# Patient Record
Sex: Female | Born: 1965 | Race: White | Hispanic: No | Smoking: Current every day smoker
Health system: Southern US, Community
[De-identification: ages and names within clinical notes are randomized; demographics above are authoritative.]

## PROBLEM LIST (undated history)

## (undated) DIAGNOSIS — T7840XA Allergy, unspecified, initial encounter: Secondary | ICD-10-CM

## (undated) DIAGNOSIS — J45909 Unspecified asthma, uncomplicated: Secondary | ICD-10-CM

## (undated) DIAGNOSIS — F1910 Other psychoactive substance abuse, uncomplicated: Secondary | ICD-10-CM

## (undated) DIAGNOSIS — D649 Anemia, unspecified: Secondary | ICD-10-CM

## (undated) DIAGNOSIS — J189 Pneumonia, unspecified organism: Secondary | ICD-10-CM

## (undated) DIAGNOSIS — I10 Essential (primary) hypertension: Secondary | ICD-10-CM

## (undated) DIAGNOSIS — F329 Major depressive disorder, single episode, unspecified: Secondary | ICD-10-CM

## (undated) DIAGNOSIS — F1010 Alcohol abuse, uncomplicated: Secondary | ICD-10-CM

## (undated) DIAGNOSIS — R51 Headache: Secondary | ICD-10-CM

## (undated) DIAGNOSIS — F419 Anxiety disorder, unspecified: Secondary | ICD-10-CM

## (undated) DIAGNOSIS — F32A Depression, unspecified: Secondary | ICD-10-CM

## (undated) HISTORY — DX: Headache: R51

## (undated) HISTORY — DX: Depression, unspecified: F32.A

## (undated) HISTORY — DX: Essential (primary) hypertension: I10

## (undated) HISTORY — PX: OTHER SURGICAL HISTORY: SHX169

## (undated) HISTORY — DX: Allergy, unspecified, initial encounter: T78.40XA

## (undated) HISTORY — DX: Other psychoactive substance abuse, uncomplicated: F19.10

## (undated) HISTORY — DX: Unspecified asthma, uncomplicated: J45.909

## (undated) HISTORY — DX: Anxiety disorder, unspecified: F41.9

## (undated) HISTORY — PX: TONSILECTOMY, ADENOIDECTOMY, BILATERAL MYRINGOTOMY AND TUBES: SHX2538

## (undated) HISTORY — DX: Alcohol abuse, uncomplicated: F10.10

## (undated) HISTORY — DX: Major depressive disorder, single episode, unspecified: F32.9

---

## 2001-01-21 ENCOUNTER — Emergency Department (HOSPITAL_COMMUNITY): Admission: EM | Admit: 2001-01-21 | Discharge: 2001-01-21 | Payer: Self-pay | Admitting: *Deleted

## 2002-09-14 ENCOUNTER — Encounter: Payer: Self-pay | Admitting: Emergency Medicine

## 2002-09-14 ENCOUNTER — Emergency Department (HOSPITAL_COMMUNITY): Admission: EM | Admit: 2002-09-14 | Discharge: 2002-09-15 | Payer: Self-pay | Admitting: Emergency Medicine

## 2003-01-09 ENCOUNTER — Emergency Department (HOSPITAL_COMMUNITY): Admission: EM | Admit: 2003-01-09 | Discharge: 2003-01-09 | Payer: Self-pay | Admitting: Emergency Medicine

## 2004-03-27 ENCOUNTER — Other Ambulatory Visit: Admission: RE | Admit: 2004-03-27 | Discharge: 2004-03-27 | Payer: Self-pay | Admitting: Obstetrics and Gynecology

## 2007-08-02 ENCOUNTER — Ambulatory Visit (HOSPITAL_BASED_OUTPATIENT_CLINIC_OR_DEPARTMENT_OTHER): Admission: RE | Admit: 2007-08-02 | Discharge: 2007-08-02 | Payer: Self-pay | Admitting: Plastic Surgery

## 2007-08-02 ENCOUNTER — Encounter (INDEPENDENT_AMBULATORY_CARE_PROVIDER_SITE_OTHER): Payer: Self-pay | Admitting: Plastic Surgery

## 2010-08-12 ENCOUNTER — Ambulatory Visit: Payer: Self-pay | Admitting: Psychology

## 2010-08-27 ENCOUNTER — Ambulatory Visit: Payer: Self-pay | Admitting: Psychology

## 2010-09-02 ENCOUNTER — Ambulatory Visit: Payer: Self-pay | Admitting: Psychology

## 2010-09-25 ENCOUNTER — Ambulatory Visit: Payer: Self-pay | Admitting: Psychology

## 2010-10-09 ENCOUNTER — Ambulatory Visit: Payer: Self-pay | Admitting: Psychology

## 2010-10-12 ENCOUNTER — Emergency Department (HOSPITAL_COMMUNITY): Admission: EM | Admit: 2010-10-12 | Discharge: 2010-10-13 | Payer: Self-pay | Admitting: Emergency Medicine

## 2010-12-17 ENCOUNTER — Ambulatory Visit
Admission: RE | Admit: 2010-12-17 | Discharge: 2010-12-17 | Payer: Self-pay | Source: Home / Self Care | Attending: Psychology | Admitting: Psychology

## 2010-12-26 ENCOUNTER — Ambulatory Visit
Admission: RE | Admit: 2010-12-26 | Discharge: 2010-12-26 | Payer: Self-pay | Source: Home / Self Care | Attending: Family Medicine | Admitting: Family Medicine

## 2010-12-26 DIAGNOSIS — F419 Anxiety disorder, unspecified: Secondary | ICD-10-CM | POA: Insufficient documentation

## 2010-12-26 DIAGNOSIS — R519 Headache, unspecified: Secondary | ICD-10-CM | POA: Insufficient documentation

## 2010-12-26 DIAGNOSIS — J45909 Unspecified asthma, uncomplicated: Secondary | ICD-10-CM | POA: Insufficient documentation

## 2010-12-26 DIAGNOSIS — F329 Major depressive disorder, single episode, unspecified: Secondary | ICD-10-CM | POA: Insufficient documentation

## 2010-12-26 DIAGNOSIS — F411 Generalized anxiety disorder: Secondary | ICD-10-CM | POA: Insufficient documentation

## 2010-12-26 DIAGNOSIS — R51 Headache: Secondary | ICD-10-CM | POA: Insufficient documentation

## 2010-12-26 DIAGNOSIS — J309 Allergic rhinitis, unspecified: Secondary | ICD-10-CM | POA: Insufficient documentation

## 2010-12-26 DIAGNOSIS — I10 Essential (primary) hypertension: Secondary | ICD-10-CM | POA: Insufficient documentation

## 2010-12-30 ENCOUNTER — Encounter: Payer: Self-pay | Admitting: Family Medicine

## 2011-01-14 ENCOUNTER — Encounter
Admission: RE | Admit: 2011-01-14 | Discharge: 2011-01-14 | Payer: Self-pay | Source: Home / Self Care | Attending: Family Medicine | Admitting: Family Medicine

## 2011-01-19 ENCOUNTER — Encounter: Payer: Self-pay | Admitting: Family Medicine

## 2011-01-22 NOTE — Assessment & Plan Note (Signed)
Summary: BRAND NEW PT/TO EST/PT REQ CPX/PT FASTING/CJR/pt rescd per dr...   Vital Signs:  Patient profile:   45 year old female Weight:      104 pounds O2 Sat:      97 % Temp:     99.5 degrees F Pulse rate:   120 / minute BP sitting:   120 / 70  (left arm) Cuff size:   regular  Vitals Entered By: Pura Spice, RN (December 26, 2010 11:43 AM) CC: to est new. states thinks problem solved her psychiatrist Dr Jennelle Human did bld work and took her off Lithium   History of Present Illness: 45 yr old female to establish with [place] after transfering from Dr. Louanna Raw. She is doing well today and has no complaints. She sees Dr. Jennelle Human for depression and anxiety, and she was started on Lithium about  2 weeks ago. Almost immediately she developed nausea and vomitiing and this was felt to be due to the lithium. She has stopped this and has been started on Abilify instead. So far she tolerates this quite well. Her asthma is well controlled, and she only needs her rescue inhaler once every 1-2 months. She has recently started using an electronic cigarrette device, and she has cut down from 1.5 ppd to 1/3 ppd.   Preventive Screening-Counseling & Management  Alcohol-Tobacco     Smoking Status: current  Allergies (verified): 1)  ! * Lithium  Comments:  Provider: The patient is currently on medications but does not know the name or dosage at this time. Instructed to contact our office with details. Will update medication list at that time.  Past History:  Past Medical History: Allergic rhinitis Anxiety Asthma Depression, possibly bipolar disorder. Sees Dr. [name] for psychiatric care and [name] for therapy Headache (last migraine was in 2004) Hypertension, never on meds  Past Surgical History: benign cyst removed from left cheek  Family History: Reviewed history and no changes required. Family History of CAD Female 1st degree relative <50 Family History Depression Family  History Diabetes 1st degree relative Family History Ovarian cancer  Social History: Reviewed history and no changes required. 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 Married, no children  Current Smoker Alcohol use-no Occupation:  employed Smoking Status:  current  Review of Systems  The patient denies anorexia, fever, weight loss, weight gain, vision loss, decreased hearing, hoarseness, chest pain, syncope, dyspnea on exertion, peripheral edema, prolonged cough, headaches, hemoptysis, abdominal pain, melena, hematochezia, severe indigestion/heartburn, hematuria, incontinence, genital sores, muscle weakness, suspicious skin lesions, transient blindness, difficulty walking, depression, unusual weight change, abnormal bleeding, enlarged lymph nodes, angioedema, breast masses, and testicular masses.         Flu Vaccine Consent Questions     Do you have a history of severe allergic reactions to this vaccine? no    Any prior history of allergic reactions to egg and/or gelatin? no    Do you have a sensitivity to the preservative Thimersol? no    Do you have a past history of Guillan-Barre Syndrome? no    Do you currently have an acute febrile illness? no    Have you ever had a severe reaction to latex? no    Vaccine information given and explained to patient? yes    Are you currently pregnant? no    Lot [id number]   Exp Date:06/20/2011   Site Given right Deltoid IM  sanofi  given by Gloris Ham CMA    Physical Exam  General:  Well-developed,well-nourished,in no acute distress; alert,appropriate and cooperative throughout examination Neck:  No deformities, masses, or tenderness noted. Lungs:  Normal respiratory effort, chest expands symmetrically. Lungs are clear to auscultation, no crackles or wheezes. Heart:  Normal rate and regular rhythm. S1 and S2 normal without gallop, murmur, click, rub or other  extra sounds. Psych:  Cognition and judgment appear intact. Alert and cooperative with normal attention span and concentration. No apparent delusions, illusions, hallucinations   Impression & Recommendations:  Problem # 1:  DEPRESSION (ICD-311)  Her updated medication list for this problem includes:    Zoloft 50 Mg Tabs (Sertraline hcl) .Marland Kitchen... 1 by mouth once daily    Klonopin 0.5 Mg Tabs (Clonazepam) .Marland Kitchen... At bedtime  Problem # 2:  ANXIETY (ICD-300.00)  Her updated medication list for this problem includes:    Zoloft 50 Mg Tabs (Sertraline hcl) .Marland Kitchen... 1 by mouth once daily    Klonopin 0.5 Mg Tabs (Clonazepam) .Marland Kitchen... At bedtime  Problem # 3:  HYPERTENSION (ICD-401.9)  Problem # 4:  HEADACHE (ICD-784.0)  Problem # 5:  ASTHMA (ICD-493.90)  Her updated medication list for this problem includes:    Singulair 10 Mg Tabs (Montelukast sodium) ..... Once daily    Qvar 40 Mcg/act Aers (Beclomethasone dipropionate) .Marland Kitchen... 2 puffs two times a day    Proair Hfa 108 (90 Base) Mcg/act Aers (Albuterol sulfate) .Marland Kitchen... 2 inh q4h as needed shortness of breath  Problem # 6:  ALLERGIC RHINITIS (ICD-477.9)  Her updated medication list for this problem includes:    Omnaris 50 Mcg/act Susp (Ciclesonide) .Marland Kitchen... 2 sprays each nostril two times a day    Claritin 10 Mg Tabs (Loratadine) ..... Once daily  Complete Medication List: 1)  Singulair 10 Mg Tabs (Montelukast sodium) .... Once daily 2)  Qvar 40 Mcg/act Aers (Beclomethasone dipropionate) .... 2 puffs two times a day 3)  Zoloft 50 Mg Tabs (Sertraline hcl) .Marland Kitchen.. 1 by mouth once daily 4)  Klonopin 0.5 Mg Tabs (Clonazepam) .... At bedtime 5)  Abilify 2 Mg Tabs (Aripiprazole) .... At bedtime 6)  Proair Hfa 108 (90 Base) Mcg/act Aers (Albuterol sulfate) .... 2 inh q4h as needed shortness of breath 7)  Omnaris 50 Mcg/act Susp (Ciclesonide) .... 2 sprays each nostril two times a day 8)  Claritin 10 Mg Tabs (Loratadine) .... Once daily  Other Orders: Admin  1st Vaccine (09811) Flu Vaccine 5yrs + (91478)  Patient Instructions: 1)  encouraged her to stop smoking completely. Given a flu shot today. She is set for a cpx with labs next month.    Orders Added: 1)  New Patient Level IV [99204] 2)  Admin 1st Vaccine [90471] 3)  Flu Vaccine 57yrs + [29562]

## 2011-01-22 NOTE — Letter (Signed)
Summary: PATIENT HX FORM  PATIENT HX FORM   Imported By: Georgian Co 12/30/2010 09:01:33  _____________________________________________________________________  External Attachment:    Type:   Image     Comment:   External Document

## 2011-01-23 ENCOUNTER — Emergency Department (HOSPITAL_COMMUNITY): Payer: 59

## 2011-01-23 ENCOUNTER — Emergency Department (HOSPITAL_COMMUNITY)
Admission: EM | Admit: 2011-01-23 | Discharge: 2011-01-24 | Disposition: A | Discharge status: Home / Self Care | Payer: 59 | Attending: Emergency Medicine | Admitting: Emergency Medicine

## 2011-01-23 DIAGNOSIS — W230XXA Caught, crushed, jammed, or pinched between moving objects, initial encounter: Secondary | ICD-10-CM | POA: Insufficient documentation

## 2011-01-23 DIAGNOSIS — M79609 Pain in unspecified limb: Secondary | ICD-10-CM | POA: Insufficient documentation

## 2011-01-23 DIAGNOSIS — IMO0002 Reserved for concepts with insufficient information to code with codable children: Secondary | ICD-10-CM | POA: Insufficient documentation

## 2011-01-23 DIAGNOSIS — S6720XA Crushing injury of unspecified hand, initial encounter: Secondary | ICD-10-CM | POA: Insufficient documentation

## 2011-01-27 ENCOUNTER — Other Ambulatory Visit: Payer: 59 | Admitting: Family Medicine

## 2011-01-27 DIAGNOSIS — Z0000 Encounter for general adult medical examination without abnormal findings: Secondary | ICD-10-CM

## 2011-01-27 LAB — CBC WITH DIFFERENTIAL/PLATELET
Basophils Relative: 0.6 % (ref 0.0–3.0)
Eosinophils Relative: 4.8 % (ref 0.0–5.0)
HCT: 36.3 % (ref 36.0–46.0)
Hemoglobin: 12.4 g/dL (ref 12.0–15.0)
Lymphs Abs: 2.1 10*3/uL (ref 0.7–4.0)
Monocytes Relative: 10.3 % (ref 3.0–12.0)
Neutro Abs: 2.4 10*3/uL (ref 1.4–7.7)
WBC: 5.3 10*3/uL (ref 4.5–10.5)

## 2011-01-27 LAB — LIPID PANEL
HDL: 66 mg/dL (ref >39.00)
LDL Cholesterol: 114 mg/dL — ABNORMAL HIGH (ref 0–99)
Total CHOL/HDL Ratio: 3
Triglycerides: 57 mg/dL (ref 0.0–149.0)

## 2011-01-27 LAB — POCT URINALYSIS DIPSTICK
Bilirubin, UA: NEGATIVE
Glucose, UA: NEGATIVE
Leukocytes, UA: NEGATIVE
Spec Grav, UA: 1.005

## 2011-01-27 LAB — BASIC METABOLIC PANEL
CO2: 26 mEq/L (ref 19–32)
Chloride: 106 mEq/L (ref 96–112)
Creatinine, Ser: 0.7 mg/dL (ref 0.4–1.2)
Glucose, Bld: 75 mg/dL (ref 70–99)

## 2011-01-27 LAB — HEPATIC FUNCTION PANEL: Albumin: 4.1 g/dL (ref 3.5–5.2)

## 2011-01-28 ENCOUNTER — Ambulatory Visit: Payer: Self-pay | Admitting: Psychology

## 2011-01-29 ENCOUNTER — Telehealth: Payer: Self-pay

## 2011-01-29 NOTE — Telephone Encounter (Signed)
Message copied by Madison Hickman on Thu Jan 29, 2011  8:01 AM ------      Message from: Dwaine Deter      Created: Wed Jan 28, 2011  5:06 PM       normal

## 2011-01-29 NOTE — Telephone Encounter (Signed)
Pt called results normal

## 2011-02-11 ENCOUNTER — Ambulatory Visit (INDEPENDENT_AMBULATORY_CARE_PROVIDER_SITE_OTHER): Payer: 59 | Admitting: Family Medicine

## 2011-02-11 ENCOUNTER — Encounter: Payer: Self-pay | Admitting: Family Medicine

## 2011-02-11 ENCOUNTER — Other Ambulatory Visit (HOSPITAL_COMMUNITY)
Admission: RE | Admit: 2011-02-11 | Discharge: 2011-02-11 | Disposition: A | Discharge status: Home / Self Care | Payer: 59 | Source: Ambulatory Visit | Attending: Family Medicine | Admitting: Family Medicine

## 2011-02-11 VITALS — BP 120/74 | HR 99 | Ht 61.0 in | Wt 107.0 lb

## 2011-02-11 DIAGNOSIS — Z01419 Encounter for gynecological examination (general) (routine) without abnormal findings: Secondary | ICD-10-CM | POA: Insufficient documentation

## 2011-02-11 DIAGNOSIS — Z0000 Encounter for general adult medical examination without abnormal findings: Secondary | ICD-10-CM

## 2011-02-11 MED ORDER — MONTELUKAST SODIUM 10 MG PO TABS: 10.0000 mg | ORAL_TABLET | Freq: Every day | ORAL | Status: DC

## 2011-02-11 MED ORDER — IPRATROPIUM-ALBUTEROL 0.5-2.5 (3) MG/3ML IN SOLN: 3.0000 mL | RESPIRATORY_TRACT | Status: DC | PRN

## 2011-02-11 MED ORDER — BECLOMETHASONE DIPROPIONATE 40 MCG/ACT IN AERS: 2.0000 | INHALATION_SPRAY | Freq: Two times a day (BID) | RESPIRATORY_TRACT | Status: DC

## 2011-02-11 MED ORDER — ALBUTEROL SULFATE HFA 108 (90 BASE) MCG/ACT IN AERS: 2.0000 | INHALATION_SPRAY | RESPIRATORY_TRACT | Status: DC | PRN

## 2011-02-11 MED ORDER — CICLESONIDE 50 MCG/ACT NA SUSP: 2.0000 | Freq: Two times a day (BID) | NASAL | Status: DC

## 2011-02-11 NOTE — Progress Notes (Signed)
  Subjective:    Patient ID: Kelly Zuniga, female    DOB: [birth year], 45 y.o.   MRN: [id number]  HPI 45 yr old female for a cpx. She feels well and has no complaints.   Review of Systems  Constitutional: Negative.  Negative for fever, diaphoresis, activity change, appetite change, fatigue and unexpected weight change.  HENT: Negative.  Negative for hearing loss, ear pain, nosebleeds, congestion, sore throat, trouble swallowing, neck pain, neck stiffness, voice change and tinnitus.   Eyes: Negative.  Negative for photophobia, pain, discharge, redness and visual disturbance.  Respiratory: Negative.  Negative for apnea, cough, choking, chest tightness, shortness of breath, wheezing and stridor.   Cardiovascular: Negative.  Negative for chest pain, palpitations and leg swelling.  Gastrointestinal: Negative.  Negative for nausea, vomiting, abdominal pain, diarrhea, constipation, blood in stool, abdominal distention and rectal pain.  Genitourinary: Negative.  Negative for dysuria, urgency, frequency, hematuria, flank pain, scrotal swelling, vaginal bleeding, vaginal discharge, enuresis, difficulty urinating, testicular pain, vaginal pain and menstrual problem.  Musculoskeletal: Negative.  Negative for myalgias, back pain, joint swelling, arthralgias and gait problem.  Skin: Negative.  Negative for color change, pallor, rash and wound.  Neurological: Negative.  Negative for dizziness, tremors, seizures, syncope, speech difficulty, weakness, light-headedness, numbness and headaches.  Hematological: Negative.  Negative for adenopathy. Does not bruise/bleed easily.  Psychiatric/Behavioral: Negative.  Negative for hallucinations, behavioral problems, confusion, sleep disturbance, dysphoric mood and agitation. The patient is not nervous/anxious.        Objective:   Physical Exam  Constitutional: She appears well-developed and well-nourished. No distress.  HENT:  Head: Normocephalic and atraumatic.    Right Ear: External ear normal.  Left Ear: External ear normal.  Nose: Nose normal.  Mouth/Throat: Oropharynx is clear and moist. No oropharyngeal exudate.  Eyes: Conjunctivae and EOM are normal. Pupils are equal, round, and reactive to light. Right eye exhibits no discharge. Left eye exhibits no discharge. No scleral icterus.  Neck: Normal range of motion. Neck supple. No JVD present. No thyromegaly present.  Cardiovascular: Normal rate, regular rhythm, normal heart sounds and intact distal pulses.  Exam reveals no gallop and no friction rub.   No murmur heard. Pulmonary/Chest: Effort normal and breath sounds normal. No stridor. No respiratory distress. She has no wheezes. She has no rales. She exhibits no tenderness.  Abdominal: Soft. Normal appearance and bowel sounds are normal. She exhibits no distension, no abdominal bruit, no ascites and no mass. There is no hepatosplenomegaly. There is no tenderness. There is no rigidity, no rebound and no guarding. No hernia.  Genitourinary: Rectum normal, vagina normal and uterus normal. No breast swelling, tenderness, discharge or bleeding. Cervix exhibits no motion tenderness, no discharge and no friability. Right adnexum displays no mass, no tenderness and no fullness. Left adnexum displays no mass, no tenderness and no fullness. No erythema, tenderness or bleeding around the vagina. No vaginal discharge found.  Musculoskeletal: Normal range of motion. She exhibits no edema and no tenderness.  Lymphadenopathy:    She has no cervical adenopathy.  Neurological: She is alert. She has normal reflexes. No cranial nerve deficit. She exhibits normal muscle tone. Coordination normal.  Skin: Skin is warm and dry. No rash noted. She is not diaphoretic. No erythema. No pallor.  Psychiatric: She has a normal mood and affect. Her behavior is normal. Judgment and thought content normal.          Assessment & Plan:  Well exam.

## 2011-02-19 ENCOUNTER — Telehealth: Payer: Self-pay | Admitting: *Deleted

## 2011-02-19 NOTE — Telephone Encounter (Signed)
Given pt normal pap results.

## 2011-03-04 LAB — URINE MICROSCOPIC-ADD ON

## 2011-03-04 LAB — URINALYSIS, ROUTINE W REFLEX MICROSCOPIC
Glucose, UA: NEGATIVE mg/dL
Protein, ur: 100 mg/dL — AB
pH: 7 (ref 5.0–8.0)

## 2011-03-04 LAB — POCT PREGNANCY, URINE: Preg Test, Ur: NEGATIVE

## 2011-03-13 ENCOUNTER — Other Ambulatory Visit: Payer: Self-pay | Admitting: *Deleted

## 2011-03-13 MED ORDER — CIPROFLOXACIN HCL 500 MG PO TABS: 500.0000 mg | ORAL_TABLET | Freq: Two times a day (BID) | ORAL | Status: AC

## 2011-03-13 NOTE — Telephone Encounter (Signed)
Call in Cipro 500 mg bid for 7 days  

## 2011-03-13 NOTE — Telephone Encounter (Signed)
[facility] Pt has UTI, dysuria, and her usual symptoms......Marland KitchenWants RX and no office visit.

## 2011-03-13 NOTE — Telephone Encounter (Signed)
rx called in and pt notified. 

## 2011-03-20 ENCOUNTER — Telehealth: Payer: Self-pay

## 2011-03-20 NOTE — Telephone Encounter (Signed)
Opened in error

## 2011-04-29 ENCOUNTER — Ambulatory Visit: Payer: 59 | Admitting: Psychology

## 2011-05-05 NOTE — Op Note (Signed)
Kelly Zuniga, Kelly Zuniga           ACCOUNT NO.:  [id number]   MEDICAL RECORD NO.:  [id number]          PATIENT TYPE:  AMB   LOCATION:  [facility]SC                          FACILITY:  [facility]   PHYSICIAN:  Brantley Persons, M.D.DATE OF BIRTH:  [birth year]   DATE OF PROCEDURE:  08/02/2007  DATE OF DISCHARGE:                               OPERATIVE REPORT   PREOPERATIVE DIAGNOSIS:  Right cheek soft tissue mass.   POSTOPERATIVE DIAGNOSIS:  Right cheek soft tissue mass.   PROCEDURE:  1. Excision of 0.7 cm soft tissue mass, right cheek.  2. Complex closure of 1 cm right cheek incision.   ATTENDING SURGEON:  Brantley Persons, M.D.   ANESTHESIA:  1% lidocaine with epinephrine.   COMPLICATIONS:  None.   INDICATIONS FOR PROCEDURE:  The patient is a 45 year old Caucasian  female who has a soft tissue mass of the right upper central cheek area.  She presents to undergo an excision of this soft tissue mass.   DESCRIPTION OF PROCEDURE:  The soft tissue mass was identified in the  right upper cheek and an incision was marked to lie within the natural  skin tension lines over the mass.  The skin and subcutaneous tissues in  the area of the soft tissue mass were then injected with 1% lidocaine  with epinephrine.  After adequate hemostasis and anesthesia had taken  effect, the procedure was begun.   An incision was made over the soft tissue mass and included excision of  an apparent involved clinical skin pore.  Upon entering the subcutaneous  tissues, the soft tissue mass appeared to be a sebaceous cyst.  The cyst  with its lining and internal contents, were then dissected free from  surrounding soft tissues.  The soft tissue mass was thus excised from  the soft tissues and passed off the table to undergo permanent  pathologic section evaluation.  The wound was then examined and the  remnants of the cyst lining were removed thoroughly.  Meticulous  hemostasis was obtained.  The total length of the  soft tissue mass  removed was 0.7 cm.  The wound was irrigated with saline irrigation.  The incision was then closed in a complex fashion.  The deeper  subcutaneous tissues were closed with 4-0 Monocryl suture.  The dermal  layer was also closed with a 4-0 Monocryl interrupted simple sutures.  The skin was then closed with a 6-0 in a running baseball type of  stitch.  There were no complications.  The patient tolerated the  procedure well.  The incision was dressed with bacitracin ointment.  The  patient was given proper postoperative wound care instructions.  The  patient was discharged home in stable condition.  Total length of  incision closure was 1.1 cm.   The patient did well and was discharged home in stable condition.  Follow up appointment will be tomorrow in the office.           ______________________________  Brantley Persons, M.D.     MC/MEDQ  D:  08/02/2007  T:  08/04/2007  Job:  [id number]

## 2011-05-13 ENCOUNTER — Ambulatory Visit (INDEPENDENT_AMBULATORY_CARE_PROVIDER_SITE_OTHER): Payer: 59 | Admitting: Psychology

## 2011-05-13 DIAGNOSIS — F411 Generalized anxiety disorder: Secondary | ICD-10-CM

## 2011-05-21 ENCOUNTER — Ambulatory Visit (INDEPENDENT_AMBULATORY_CARE_PROVIDER_SITE_OTHER): Payer: 59 | Admitting: Psychology

## 2011-05-21 DIAGNOSIS — F411 Generalized anxiety disorder: Secondary | ICD-10-CM

## 2011-06-01 ENCOUNTER — Ambulatory Visit: Payer: 59 | Admitting: Internal Medicine

## 2011-06-04 ENCOUNTER — Ambulatory Visit (INDEPENDENT_AMBULATORY_CARE_PROVIDER_SITE_OTHER): Payer: 59 | Admitting: Psychology

## 2011-06-04 DIAGNOSIS — F411 Generalized anxiety disorder: Secondary | ICD-10-CM

## 2011-08-18 ENCOUNTER — Encounter: Payer: Self-pay | Admitting: Family Medicine

## 2011-08-18 ENCOUNTER — Ambulatory Visit (INDEPENDENT_AMBULATORY_CARE_PROVIDER_SITE_OTHER): Payer: 59 | Admitting: Family Medicine

## 2011-08-18 VITALS — BP 112/70 | HR 104 | Temp 99.1°F | Wt 110.0 lb

## 2011-08-18 DIAGNOSIS — N920 Excessive and frequent menstruation with regular cycle: Secondary | ICD-10-CM

## 2011-08-18 NOTE — Progress Notes (Signed)
  Subjective:    Patient ID: Kelly Zuniga, female    DOB: [birth year], 45 y.o.   MRN: [id number]  HPI Here with concerns over irregular menstrual bleeding. She had a normal pelvic exam here in February, and she has always had very regular cycles until the past 2 months. The time before last she had a cycle one week early, and the next cycle she had 2 weeks late. This started 4 days ago, and she had very heavy bleeding with clotting for 2 days. Her cramps were not particularly bad. Then the bleeding dropped off quite a bit, and today has been quite light. No vaginal DC or odor. No fevers. No change in urinary or bowel habits. No change in meds, and she does not take BCP. She notes that her mother started menopause in her early 83s.    Review of Systems  Constitutional: Negative.   Respiratory: Negative.   Cardiovascular: Negative.   Gastrointestinal: Negative.   Genitourinary: Positive for vaginal bleeding and menstrual problem. Negative for pelvic pain.       Objective:   Physical Exam  Constitutional: She appears well-developed and well-nourished.  Abdominal: Soft. Bowel sounds are normal. She exhibits no distension and no mass. There is no tenderness. There is no rebound and no guarding.          Assessment & Plan:  This is probably some early menopausal changes, but we will draw some labs today. If she resumes more heavy or extended bleeding, we may get a pelvic US or refer her to GYN.

## 2011-08-19 LAB — BASIC METABOLIC PANEL
BUN: 7 mg/dL (ref 6–23)
CO2: 26 mEq/L (ref 19–32)
Glucose, Bld: 75 mg/dL (ref 70–99)
Potassium: 4.4 mEq/L (ref 3.5–5.1)
Sodium: 141 mEq/L (ref 135–145)

## 2011-08-19 LAB — CBC WITH DIFFERENTIAL/PLATELET
Basophils Relative: 0.5 % (ref 0.0–3.0)
Eosinophils Relative: 6.6 % — ABNORMAL HIGH (ref 0.0–5.0)
HCT: 39.4 % (ref 36.0–46.0)
MCV: 104 fl — ABNORMAL HIGH (ref 78.0–100.0)
Monocytes Relative: 10.2 % (ref 3.0–12.0)
Neutrophils Relative %: 49 % (ref 43.0–77.0)
Platelets: 211 10*3/uL (ref 150.0–400.0)
RBC: 3.79 Mil/uL — ABNORMAL LOW (ref 3.87–5.11)
WBC: 4.2 10*3/uL — ABNORMAL LOW (ref 4.5–10.5)

## 2011-08-19 LAB — FOLLICLE STIMULATING HORMONE: FSH: 10.6 m[IU]/mL

## 2011-08-25 ENCOUNTER — Telehealth: Payer: Self-pay | Admitting: *Deleted

## 2011-08-25 DIAGNOSIS — N920 Excessive and frequent menstruation with regular cycle: Secondary | ICD-10-CM

## 2011-08-25 NOTE — Telephone Encounter (Signed)
patient  Is aware of lab results.  However her LMP was 2 week late and she had heavy bleeding.  She would like to know if she should be concerned?

## 2011-08-25 NOTE — Telephone Encounter (Signed)
Message copied by Trenton Gammon on Tue Aug 25, 2011  4:22 PM ------      Message from: Gershon Crane A      Created: Sun Aug 23, 2011  7:58 PM       Normal labs including hormone levels. She is not menopausal yet

## 2011-08-25 NOTE — Telephone Encounter (Signed)
Pt is calling for lab results 

## 2011-08-26 NOTE — Telephone Encounter (Signed)
At this point I would suggest she see a GYN for this. We discussed this at our last visit. Refer her to GYN for menorrhagia (if she does not have a preference, try Dr. Waynard Reeds)

## 2011-08-27 NOTE — Telephone Encounter (Signed)
I spoke with pt and put in the referral.

## 2011-08-28 ENCOUNTER — Telehealth: Payer: Self-pay | Admitting: *Deleted

## 2011-08-28 NOTE — Telephone Encounter (Signed)
What is this about? What eye doctor at [facility] ?

## 2011-08-28 NOTE — Telephone Encounter (Signed)
Pt saw the eye MD at [facility] and he wants Dr. Clent Ridges to order a MRI.  She will drop off the order next week so we can order this.

## 2011-08-31 NOTE — Telephone Encounter (Signed)
Left voice message for pt to return my call.

## 2011-09-01 NOTE — Telephone Encounter (Signed)
She needs Dr. Clent Ridges to put in order due to insurance purposes.

## 2011-09-15 ENCOUNTER — Telehealth: Payer: Self-pay | Admitting: Family Medicine

## 2011-09-15 NOTE — Telephone Encounter (Signed)
This pt left a v.mail on my line. States she is wondering if we have a referral for her for an MRI for [facility]? I looked in the chart and saw where there was communication on this in early September. I do not know anything about this. If we are or are not going to give her a referral, someone needs to please call the pt and let her know what is going on. Thanks!

## 2011-09-17 NOTE — Telephone Encounter (Signed)
I spoke with the patient and explained this situation, including the insurance ins & outs. I also told her she could still have it done locally if [facility] ordered the test.. She understood and said she would call them to obtain the order.

## 2011-09-17 NOTE — Telephone Encounter (Signed)
We did receive a note from Dr. 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 They recommended she have a contrasted brain MRI, and asked [place] to get this locally. I don't even know what an Adie's pupil is. They need to order this not me, because they will have to explain to her insurance company why she needs this done

## 2011-09-25 ENCOUNTER — Telehealth: Payer: Self-pay | Admitting: *Deleted

## 2011-09-25 NOTE — Telephone Encounter (Signed)
Pt is calling to let Dr. Clent Ridges know that she got her results of MRI from [facility], and they will be sending him a letter re: results, and what needs to happen next.  Would like to be called when this is received to set up an appt, or whatever she needs to do next.

## 2011-09-25 NOTE — Telephone Encounter (Signed)
Noted  

## 2011-10-06 ENCOUNTER — Telehealth: Payer: Self-pay | Admitting: Family Medicine

## 2011-10-06 NOTE — Telephone Encounter (Signed)
Pt wants to know what the next step for her plan of care after reviewing her MRI? Also does she need to come in for a office visit to discuss this?

## 2011-10-07 NOTE — Telephone Encounter (Signed)
I still have not received anything from [facility] about this

## 2011-10-08 NOTE — Telephone Encounter (Signed)
Spoke with pt and she will get the results sent to [place].

## 2011-10-09 ENCOUNTER — Telehealth: Payer: Self-pay | Admitting: Family Medicine

## 2011-10-09 NOTE — Telephone Encounter (Signed)
Referral was done  

## 2011-10-09 NOTE — Telephone Encounter (Signed)
Spoke with pt and she would like the referral, no one in particular.

## 2011-10-12 ENCOUNTER — Encounter: Payer: Self-pay | Admitting: Neurology

## 2011-10-26 ENCOUNTER — Telehealth: Payer: Self-pay | Admitting: Family Medicine

## 2011-10-26 NOTE — Telephone Encounter (Signed)
Pt is requesting a copy of her letter that came to [place] from [place].

## 2011-10-27 ENCOUNTER — Encounter: Payer: Self-pay | Admitting: Neurology

## 2011-10-27 ENCOUNTER — Ambulatory Visit (INDEPENDENT_AMBULATORY_CARE_PROVIDER_SITE_OTHER): Payer: 59 | Admitting: Neurology

## 2011-10-27 DIAGNOSIS — G43909 Migraine, unspecified, not intractable, without status migrainosus: Secondary | ICD-10-CM

## 2011-10-27 MED ORDER — SUMATRIPTAN SUCCINATE 50 MG PO TABS: 50.0000 mg | ORAL_TABLET | Freq: Once | ORAL | Status: DC | PRN

## 2011-10-27 MED ORDER — AMITRIPTYLINE HCL 25 MG PO TABS: ORAL_TABLET | ORAL | Status: DC

## 2011-10-27 NOTE — Progress Notes (Signed)
Dear Dr. Clent Ridges,  Thank you for having me see Kelly Zuniga in consultation today at [facility] for her problem with headaches and her diagnosis of Adie's pupil.  As you may recall, she is a 45 y.o. year old female with a history of Adie's pupil, anxiety and depression who has chronic headaches.  On three occassions these have been associated with imbalance resulting in falls.  She describes the headaches as lasting 1-3 days.  They are dull, bifrontal or right sided.  They are accompanied by photo and phonophobia.  She has nausea and sometimes vomiting with the headaches.  She has never had a visual aura.  She has had imbalance as above with the headaches, but no loss of consiousness.  These headaches started when she was late teens.  They occur as often as 25 days per month, and as little as 1-2 times per month.  She has only used Aleve as an abortive.  She has tried avoidance of triggers such as stress, treating her anxiety and depression, taking vitamins with little success.  She has never been on a preventative medication.  She denies significant caffeine use.  She has a history of an Adie's pupil, but then had improvement which triggered investigation for other causes.  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  Past Medical History  Diagnosis Date  . Allergy   . Anxiety   . Asthma   . Depression     posibly bipolar disorder. Sees Dr [name] for psychiatric care and Dr [name] for therapy  . Headache     last migraine 2004  . Hypertension     never on meds   Adie's syndrome  Past Surgical History  Procedure Date  . Benign cyst     removed from left cheek  . Tonsilectomy, adenoidectomy, bilateral myringotomy and tubes     History   Social History  . Marital Status: Married    Spouse Name: N/A    Number of Children: N/A  . Years of Education: N/A   Social History Main Topics  . Smoking status: Current Everyday Smoker    Types: Cigarettes    . Smokeless tobacco: Never Used   Comment: Using the E cigarette to help her quit.  . Alcohol Use: Yes  . Drug Use: No  . Sexually Active: None   Other Topics Concern  . None   Social History Narrative  . None    Family History  Problem Relation Age of Onset  . Coronary artery disease      1st degree relative female <50  . Depression      Family Hx  . Diabetes      Family Hx 1st degree relative  . Ovarian cancer      Family Hx    Mother had "sinus" headaches.   ROS:  13 systems were reviewed and are notable for difficulty sleeping and the other symptoms mentioned above.  She also has a history of difficulty keeping on weight.  All other review of systems are unremarkable.   Examination:  Filed Vitals:   10/27/11 0825  BP: 118/80  Pulse: 92  Height: 5' (1.524 m)  Weight: 109 lb (49.442 kg)     In general, thin appearing petite woman.  Cardiovascular: The patient has a regular rate and rhythm and no carotid bruits.  Fundoscopy:  Disks are flat. Vessel caliber within normal limits.  Mental status:   The patient is oriented to person, place  and time. Recent and remote memory are intact. Attention span and concentration are normal. Language including repetition, naming, following commands are intact. Fund of knowledge of current and historical events, as well as vocabulary are normal.  Cranial Nerves: Pupils are equally round and reactive to light. Visual fields full to confrontation. Extraocular movements are intact without nystagmus. Facial sensation and muscles of mastication are intact. Muscles of facial expression are symmetric. Hearing intact to bilateral finger rub. Tongue protrusion, uvula, palate midline.  Shoulder shrug intact  Motor:  The patient has normal bulk and tone, no pronator drift.  There are no adventitious movements.  5/5 bilaterally.  Reflexes:   Biceps  Triceps Brachioradialis Knee Ankle  Right 2+  2+  2+   2+ 2+  Left   2+  2+  2+   2+ 2+  Toes down  Coordination:  Normal finger to nose.  No dysdiadokinesia.  Sensation is intact to light touch.  Gait and Station are normal.  Tandem gait is intact.  Romberg is negative   Impression/Recommendations: Migraine, sometimes complicated by possible cerebellar symptoms.  I am going to get a copy of her MRI brain but I don't have a good explanation for why her Adie's pupils have improved.  In any case, I think she needs to be on a preventative for her migraines, and I think Elavil to increase to 25mg  qhs makes the most sense given her sleep disturbance.  I have also given her a prescription for Imitrex 50mg .  I have dose it on the low side because of her size.   We will see the patient back in 6 weeks.  Thank you for having [place] see Kelly Zuniga in consultation.  Feel free to contact me with any questions.  Lupita Raider Modesto Charon, MD [facility], [place] [address] [place], [place] [address] Phone: [phone] Fax: [phone].

## 2011-11-04 NOTE — Telephone Encounter (Signed)
Left voice message, pt may want to call [facility] and get a copy of that. I do not see it in our system.

## 2011-11-17 ENCOUNTER — Telehealth: Payer: Self-pay

## 2011-11-17 NOTE — Telephone Encounter (Signed)
Pt to come in and sign records release for [facility]

## 2011-11-17 NOTE — Telephone Encounter (Signed)
Message copied by Lelon Huh on Tue Nov 17, 2011  9:51 AM ------      Message from: Denton Meek H      Created: Mon Nov 16, 2011  9:40 AM       Could we see if we could get a copy of the images of her MRI brain from [facility]?  She just had it done this year.  Thx.

## 2011-11-19 ENCOUNTER — Emergency Department (HOSPITAL_COMMUNITY)
Admission: EM | Admit: 2011-11-19 | Discharge: 2011-11-19 | Disposition: A | Discharge status: Home / Self Care | Payer: 59 | Attending: Emergency Medicine | Admitting: Emergency Medicine

## 2011-11-19 ENCOUNTER — Encounter (HOSPITAL_COMMUNITY): Payer: Self-pay | Admitting: *Deleted

## 2011-11-19 DIAGNOSIS — I10 Essential (primary) hypertension: Secondary | ICD-10-CM | POA: Insufficient documentation

## 2011-11-19 DIAGNOSIS — R112 Nausea with vomiting, unspecified: Secondary | ICD-10-CM | POA: Insufficient documentation

## 2011-11-19 DIAGNOSIS — R739 Hyperglycemia, unspecified: Secondary | ICD-10-CM

## 2011-11-19 DIAGNOSIS — R7309 Other abnormal glucose: Secondary | ICD-10-CM | POA: Insufficient documentation

## 2011-11-19 DIAGNOSIS — E860 Dehydration: Secondary | ICD-10-CM | POA: Insufficient documentation

## 2011-11-19 DIAGNOSIS — R509 Fever, unspecified: Secondary | ICD-10-CM | POA: Insufficient documentation

## 2011-11-19 DIAGNOSIS — J45909 Unspecified asthma, uncomplicated: Secondary | ICD-10-CM | POA: Insufficient documentation

## 2011-11-19 DIAGNOSIS — R109 Unspecified abdominal pain: Secondary | ICD-10-CM | POA: Insufficient documentation

## 2011-11-19 LAB — POCT I-STAT, CHEM 8
BUN: 10 mg/dL (ref 6–23)
Chloride: 102 mEq/L (ref 96–112)
Creatinine, Ser: 0.6 mg/dL (ref 0.50–1.10)
Potassium: 3.8 mEq/L (ref 3.5–5.1)
Sodium: 136 mEq/L (ref 135–145)
TCO2: 19 mmol/L (ref 0–100)

## 2011-11-19 LAB — GLUCOSE, CAPILLARY
Glucose-Capillary: 223 mg/dL — ABNORMAL HIGH (ref 70–99)
Glucose-Capillary: 230 mg/dL — ABNORMAL HIGH (ref 70–99)
Glucose-Capillary: 180 mg/dL — ABNORMAL HIGH (ref 70–99)

## 2011-11-19 MED ORDER — SODIUM CHLORIDE 0.9 % IV BOLUS (SEPSIS)
1000.0000 mL | Freq: Once | INTRAVENOUS | Status: AC
  Administered 2011-11-19: 1000 mL via INTRAVENOUS

## 2011-11-19 MED ORDER — METFORMIN HCL 500 MG PO TABS: 500.0000 mg | ORAL_TABLET | Freq: Every day | ORAL | Status: DC

## 2011-11-19 MED ORDER — ONDANSETRON 8 MG PO TBDP: 8.0000 mg | ORAL_TABLET | Freq: Three times a day (TID) | ORAL | Status: AC | PRN

## 2011-11-19 MED ORDER — SODIUM CHLORIDE 0.9 % IV BOLUS (SEPSIS)
500.0000 mL | Freq: Once | INTRAVENOUS | Status: AC
  Administered 2011-11-19: 500 mL via INTRAVENOUS

## 2011-11-19 NOTE — ED Notes (Signed)
Rx x 2, pt voiced understanding to f/u with PCP tomorrow and take rx as prescribed.

## 2011-11-19 NOTE — ED Provider Notes (Signed)
History     CSN: [id number] Arrival date & time: 11/19/2011  4:57 PM   First MD Initiated Contact with Patient 11/19/11 1950      Chief Complaint  Patient presents with  . Dehydration    (Consider location/radiation/quality/duration/timing/severity/associated sxs/prior treatment) Patient is a 45 y.o. female presenting with vomiting. The history is provided by the patient.  Emesis  This is a new problem. The current episode started 6 to 12 hours ago. The problem occurs continuously. The problem has been rapidly improving. The emesis has an appearance of stomach contents. The maximum temperature recorded prior to her arrival was 100 to 100.9 F. Associated symptoms include abdominal pain. Pertinent negatives include no diarrhea and no headaches.   patient states if she woke up this morning with nausea vomiting. Some crampy upper bowel pain. No diarrhea. Low-grade fevers. She states that she went to an urgent care in background was given the pill in her mouth which helped with the nausea since. She states she feels a lot better now. No current vomiting. She drank a bottle of water. She continues to have a fast heart rate. She states that she was told to come to the ER because her electrolytes may be off.  Past Medical History  Diagnosis Date  . Allergy   . Anxiety   . Asthma   . Depression     posibly bipolar disorder. Sees Dr [name] for psychiatric care and Dr [name] for therapy  . Headache     last migraine 2004  . Hypertension     never on meds     Past Surgical History  Procedure Date  . Benign cyst     removed from left cheek  . Tonsilectomy, adenoidectomy, bilateral myringotomy and tubes     Family History  Problem Relation Age of Onset  . Coronary artery disease      1st degree relative female <50  . Depression      Family Hx  . Diabetes      Family Hx 1st degree relative  . Ovarian cancer      Family Hx     History  Substance Use Topics  . Smoking  status: Current Everyday Smoker    Types: Cigarettes  . Smokeless tobacco: Never Used   Comment: Using the E cigarette to help her quit.  . Alcohol Use: Yes    OB History    Grav Para Term Preterm Abortions TAB SAB Ect Mult Living                  Review of Systems  Constitutional: Negative for activity change and appetite change.  HENT: Negative for neck stiffness.   Eyes: Negative for pain.  Respiratory: Negative for chest tightness and shortness of breath.   Cardiovascular: Negative for chest pain and leg swelling.  Gastrointestinal: Positive for nausea, vomiting and abdominal pain. Negative for diarrhea and constipation.  Genitourinary: Negative for flank pain.  Musculoskeletal: Negative for back pain.  Skin: Negative for rash.  Neurological: Negative for weakness, numbness and headaches.  Psychiatric/Behavioral: Negative for behavioral problems.    Allergies  Acetaminophen and Lithium  Home Medications   Current Outpatient Rx  Name Route Sig Dispense Refill  . ALBUTEROL SULFATE HFA 108 (90 BASE) MCG/ACT IN AERS Inhalation Inhale 2 puffs into the lungs every 4 (four) hours as needed. For shortness of breath    . AMITRIPTYLINE HCL 25 MG PO TABS  12.5-25 mg at bedtime. start with 0.5  tabs before bed for two weeks, then increase to 1 tab before bed at from then on.     . BECLOMETHASONE DIPROPIONATE 40 MCG/ACT IN AERS Inhalation Inhale 2 puffs into the lungs at bedtime.     Marland Kitchen CLONAZEPAM 0.5 MG PO TABS Oral Take 0.5 mg by mouth at bedtime.     Marland Kitchen MONTELUKAST SODIUM 10 MG PO TABS Oral Take 10 mg by mouth at bedtime.      . SERTRALINE HCL 50 MG PO TABS Oral Take 50 mg by mouth at bedtime.     Marland Kitchen METFORMIN HCL 500 MG PO TABS Oral Take 1 tablet (500 mg total) by mouth daily with breakfast. 20 tablet 0  . ONDANSETRON 8 MG PO TBDP Oral Take 1 tablet (8 mg total) by mouth every 8 (eight) hours as needed for nausea. 20 tablet 0  . SUMATRIPTAN SUCCINATE 50 MG PO TABS Oral Take 50 mg by  mouth daily as needed. For migraines       BP 123/71  Pulse 90  Temp(Src) 98.5 F (36.9 C) (Oral)  Resp 21  SpO2 95%  LMP 10/25/2011  Physical Exam  Nursing note and vitals reviewed. Constitutional: She is oriented to person, place, and time. She appears well-developed and well-nourished.  HENT:  Head: Normocephalic and atraumatic.  Eyes: EOM are normal. Pupils are equal, round, and reactive to light.  Neck: Normal range of motion. Neck supple.  Cardiovascular: Normal rate, regular rhythm and normal heart sounds.   No murmur heard.      Tachycardia  Pulmonary/Chest: Effort normal and breath sounds normal. No respiratory distress. She has no wheezes. She has no rales.  Abdominal: Soft. Bowel sounds are normal. She exhibits no distension. There is no tenderness. There is no rebound and no guarding.  Musculoskeletal: Normal range of motion.  Neurological: She is alert and oriented to person, place, and time. No cranial nerve deficit.  Skin: Skin is warm and dry.  Psychiatric: She has a normal mood and affect. Her speech is normal.    ED Course  Procedures (including critical care time)  Labs Reviewed  POCT I-STAT, CHEM 8 - Abnormal; Notable for the following:    Glucose, Bld 260 (*)    Calcium, Ion 1.03 (*)    All other components within normal limits  GLUCOSE, CAPILLARY - Abnormal; Notable for the following:    Glucose-Capillary 230 (*)    All other components within normal limits  GLUCOSE, CAPILLARY - Abnormal; Notable for the following:    Glucose-Capillary 223 (*)    All other components within normal limits  GLUCOSE, CAPILLARY - Abnormal; Notable for the following:    Glucose-Capillary 180 (*)    All other components within normal limits  I-STAT, CHEM 8  POCT CBG MONITORING  POCT CBG MONITORING   No results found.   1. Nausea and vomiting   2. Hyperglycemia       MDM  Patient came in with nausea and vomiting from today. She is seen in urgent care was sent  here because her electrolytes could be off. I-STAT showed a glucose of 260. She is now states she has been urinating a lot at home. No history of diabetes. She's not been any steroids recently. She's tolerated orals here. Her heart rate is improved. She urinated. She'll be started on Zofran for the nausea vomiting and a low dose of metformin. She'll follow with Dr. Clent Ridges her primary care.   Juliet Rude. Rubin Payor, MD 11/19/11 2329

## 2011-11-19 NOTE — ED Notes (Signed)
Pt has had "excessive" vomiting today and was seen at a [facility], given nausea medication and told to come to ER for further evaluation.  Pt is still having some mild nausea.  No further vomiting.

## 2011-11-19 NOTE — ED Notes (Signed)
Phlebotomy called regarding delay with chem 8.

## 2011-12-04 ENCOUNTER — Ambulatory Visit: Payer: 59 | Admitting: Neurology

## 2011-12-08 ENCOUNTER — Encounter: Payer: Self-pay | Admitting: Family Medicine

## 2011-12-08 ENCOUNTER — Ambulatory Visit (INDEPENDENT_AMBULATORY_CARE_PROVIDER_SITE_OTHER): Payer: 59 | Admitting: Family Medicine

## 2011-12-08 VITALS — BP 120/76 | HR 138 | Temp 98.8°F | Wt 112.0 lb

## 2011-12-08 DIAGNOSIS — J329 Chronic sinusitis, unspecified: Secondary | ICD-10-CM

## 2011-12-08 MED ORDER — AZITHROMYCIN 250 MG PO TABS: ORAL_TABLET | ORAL | Status: AC

## 2011-12-08 MED ORDER — HYDROCODONE-HOMATROPINE 5-1.5 MG/5ML PO SYRP: 5.0000 mL | ORAL_SOLUTION | ORAL | Status: AC | PRN

## 2011-12-08 NOTE — Progress Notes (Signed)
  Subjective:    Patient ID: Kelly Zuniga, female    DOB: 24-Oct-1966, 45 y.o.   MRN: [id number]  HPI Here for one week of sinus pressure, PND, ST, and a dry cough. On Mucinex    Review of Systems  HENT: Positive for congestion, postnasal drip and sinus pressure.   Eyes: Negative.   Respiratory: Positive for cough.        Objective:   Physical Exam  Constitutional: She appears well-developed and well-nourished.  HENT:  Right Ear: External ear normal.  Left Ear: External ear normal.  Nose: Nose normal.  Mouth/Throat: Oropharynx is clear and moist. No oropharyngeal exudate.  Eyes: Conjunctivae are normal.  Pulmonary/Chest: Effort normal and breath sounds normal.  Lymphadenopathy:    She has no cervical adenopathy.          Assessment & Plan:  Recheck prn

## 2012-01-26 ENCOUNTER — Telehealth: Payer: Self-pay | Admitting: Neurology

## 2012-01-26 NOTE — Telephone Encounter (Signed)
Pt states she signed a release allowing Dr. Modesto Charon to view her MRI results from [facility]. She is wondering about the "status" of those results.

## 2012-01-28 NOTE — Telephone Encounter (Signed)
Pt notified that we have not received the cd yet from [facility]  I refaxed the release to them and we will look for them to send this time.

## 2012-02-11 ENCOUNTER — Telehealth: Payer: Self-pay | Admitting: Neurology

## 2012-02-11 NOTE — Telephone Encounter (Signed)
Pt would like results from MRI done at [facility].

## 2012-02-12 NOTE — Telephone Encounter (Signed)
Tiffany - could you call Shanera and let her know we have not got the results and that maybe it would be faster if she got them herself.

## 2012-02-16 NOTE — Telephone Encounter (Signed)
Pt aware she will pick up disk.

## 2012-02-26 DIAGNOSIS — H57059 Tonic pupil, unspecified eye: Secondary | ICD-10-CM | POA: Insufficient documentation

## 2012-02-26 HISTORY — DX: Tonic pupil, unspecified eye: H57.059

## 2012-03-21 ENCOUNTER — Telehealth: Payer: Self-pay | Admitting: Neurology

## 2012-03-21 NOTE — Telephone Encounter (Signed)
Pt would like to know if Dr. Modesto Charon has had a chance to look over her MRI results. If so, what is his opinion?

## 2012-03-23 ENCOUNTER — Telehealth: Payer: Self-pay | Admitting: Family Medicine

## 2012-03-23 NOTE — Telephone Encounter (Signed)
Pt called req refills for beclomethasone (QVAR) 40 MCG/ACT inhale,albuterol (PROVENTIL HFA;VENTOLIN HFA) 108 (90 BASE) MCG/ACT inhaler, montelukast (SINGULAIR) 10 MG tablet to [facility].

## 2012-03-24 NOTE — Telephone Encounter (Signed)
Pt notified of mild white matter changes, that Dr. Modesto Charon feels is normal for her, otherwise normal MRI

## 2012-03-25 MED ORDER — ALBUTEROL SULFATE HFA 108 (90 BASE) MCG/ACT IN AERS: 2.0000 | INHALATION_SPRAY | RESPIRATORY_TRACT | Status: DC | PRN

## 2012-03-25 MED ORDER — BECLOMETHASONE DIPROPIONATE 40 MCG/ACT IN AERS: 2.0000 | INHALATION_SPRAY | Freq: Every day | RESPIRATORY_TRACT | Status: DC

## 2012-03-25 MED ORDER — MONTELUKAST SODIUM 10 MG PO TABS: 10.0000 mg | ORAL_TABLET | Freq: Every day | ORAL | Status: DC

## 2012-03-25 NOTE — Telephone Encounter (Signed)
I sent all 3 scripts e-scribe to [facility].

## 2012-08-29 ENCOUNTER — Telehealth: Payer: Self-pay | Admitting: Family Medicine

## 2012-08-29 NOTE — Telephone Encounter (Signed)
Caller: [name]/Patient; Patient Name: Kelly Zuniga; PCP: Gershon Crane [facility]); Best Callback Phone Number: [phone]; Call regarding Vomiting and Diarrhea, onset 9-9.  Patient had x3 episodes of Vomiting and x2 Diarrhea.  Imodium taken prior to call, Diarrhea has improved. LMP August 2013.  Patient had a microwave hamburger at 2am on 9-9, Patient feels meat wasn't cook through.  All emergent symptoms ruled out per Nausea and Vomiting Protocol, home care.  Discussed fluids, bland foods and to call back if symptoms last longer than 3 days.  Patient verbalized understanding.

## 2012-08-29 NOTE — Telephone Encounter (Signed)
Caller: Libni/Patient; Phone: [phone]; Reason for Call: Please email letter to pt at [email or web address] stating that pt can return to work tomorrow am.  Please call pt with any questions, she needs to have this when she returns to work at [employer].

## 2012-08-30 ENCOUNTER — Ambulatory Visit (INDEPENDENT_AMBULATORY_CARE_PROVIDER_SITE_OTHER): Payer: 59 | Admitting: Family Medicine

## 2012-08-30 ENCOUNTER — Encounter: Payer: Self-pay | Admitting: Family Medicine

## 2012-08-30 VITALS — BP 118/64 | HR 72 | Temp 99.2°F | Wt 106.0 lb

## 2012-08-30 DIAGNOSIS — K5289 Other specified noninfective gastroenteritis and colitis: Secondary | ICD-10-CM

## 2012-08-30 DIAGNOSIS — K529 Noninfective gastroenteritis and colitis, unspecified: Secondary | ICD-10-CM

## 2012-08-30 MED ORDER — PROMETHAZINE HCL 25 MG PO TABS: 25.0000 mg | ORAL_TABLET | ORAL | Status: DC | PRN

## 2012-08-30 MED ORDER — CIPROFLOXACIN HCL 500 MG PO TABS: 500.0000 mg | ORAL_TABLET | Freq: Two times a day (BID) | ORAL | Status: AC

## 2012-08-30 NOTE — Telephone Encounter (Signed)
We cannot document anything about this because we did not see her

## 2012-08-30 NOTE — Progress Notes (Signed)
  Subjective:    Patient ID: Kelly Zuniga, female    DOB: January 27, 1966, 46 y.o.   MRN: [id number]  HPI Here for the sudden onset yesterday of nausea, vomiting, and diarrhea. She has a low fever. No abdominal cramps or pain. Able to drink water. Using Imodium AD. She thinks this started after she insufficiently cooked a frozen hamburger in her microwave oven.    Review of Systems  Constitutional: Positive for fever.  Respiratory: Negative.   Cardiovascular: Negative.   Gastrointestinal: Positive for nausea, vomiting and diarrhea. Negative for abdominal pain, constipation, blood in stool and abdominal distention.  Genitourinary: Negative.        Objective:   Physical Exam  Constitutional: She appears well-developed and well-nourished.  Abdominal: Soft. Bowel sounds are normal. She exhibits no distension and no mass. There is no tenderness. There is no rebound and no guarding.          Assessment & Plan:  Drink Gatorade and water. Use Imodium prn. Use Phenergan prn. Cover with Cipro. Written out of work from 08-29-12 until 09-01-12.

## 2012-08-30 NOTE — Telephone Encounter (Signed)
Patient will be in today for an appt

## 2012-08-30 NOTE — Telephone Encounter (Signed)
error 

## 2012-11-22 ENCOUNTER — Encounter: Payer: Self-pay | Admitting: Family Medicine

## 2012-11-22 ENCOUNTER — Ambulatory Visit (INDEPENDENT_AMBULATORY_CARE_PROVIDER_SITE_OTHER): Payer: 59 | Admitting: Family Medicine

## 2012-11-22 VITALS — BP 116/66 | HR 119 | Temp 98.8°F | Wt 108.0 lb

## 2012-11-22 DIAGNOSIS — J329 Chronic sinusitis, unspecified: Secondary | ICD-10-CM

## 2012-11-22 DIAGNOSIS — K589 Irritable bowel syndrome without diarrhea: Secondary | ICD-10-CM

## 2012-11-22 MED ORDER — AZITHROMYCIN 250 MG PO TABS: ORAL_TABLET | ORAL | Status: DC

## 2012-11-22 MED ORDER — TRIAMCINOLONE ACETONIDE(NASAL) 55 MCG/ACT NA INHA: 2.0000 | Freq: Every day | NASAL | Status: DC

## 2012-11-22 MED ORDER — DICYCLOMINE HCL 20 MG PO TABS: 20.0000 mg | ORAL_TABLET | Freq: Three times a day (TID) | ORAL | Status: DC

## 2012-11-22 NOTE — Progress Notes (Signed)
  Subjective:    Patient ID: Kelly Zuniga, female    DOB: 08/19/1966, 46 y.o.   MRN: [id number]  HPI Here for 2 things. First for the past 3 months she has had daily abdominal cramps, bloating, and diarrhea. No fever. Using Imodium. Also she has had 10 day of sinus pressure, HA, and PND. No fever or cough.    Review of Systems  Constitutional: Negative.   HENT: Positive for congestion, postnasal drip and sinus pressure.   Eyes: Negative.   Respiratory: Negative.   Gastrointestinal: Positive for abdominal pain, diarrhea and abdominal distention. Negative for nausea, vomiting, constipation and blood in stool.       Objective:   Physical Exam  Constitutional: She appears well-developed and well-nourished.  HENT:  Right Ear: External ear normal.  Left Ear: External ear normal.  Nose: Nose normal.  Mouth/Throat: Oropharynx is clear and moist.  Eyes: Conjunctivae normal are normal.  Neck: No thyromegaly present.  Pulmonary/Chest: Effort normal and breath sounds normal.  Abdominal: Soft. Bowel sounds are normal. She exhibits no distension and no mass. There is no tenderness. There is no rebound and no guarding.  Lymphadenopathy:    She has no cervical adenopathy.          Assessment & Plan:  Treat the sinusitis with a Zpack. Try Dicyclomine for the IBS. Add a probiotic daily

## 2012-12-26 ENCOUNTER — Ambulatory Visit (INDEPENDENT_AMBULATORY_CARE_PROVIDER_SITE_OTHER): Payer: 59 | Admitting: Psychology

## 2012-12-26 DIAGNOSIS — F411 Generalized anxiety disorder: Secondary | ICD-10-CM

## 2013-02-15 ENCOUNTER — Encounter (HOSPITAL_COMMUNITY): Payer: Self-pay

## 2013-02-15 ENCOUNTER — Emergency Department (HOSPITAL_COMMUNITY)
Admission: EM | Admit: 2013-02-15 | Discharge: 2013-02-15 | Disposition: A | Discharge status: Home / Self Care | Payer: 59 | Source: Home / Self Care | Attending: Emergency Medicine | Admitting: Emergency Medicine

## 2013-02-15 DIAGNOSIS — F411 Generalized anxiety disorder: Secondary | ICD-10-CM

## 2013-02-15 DIAGNOSIS — F419 Anxiety disorder, unspecified: Secondary | ICD-10-CM

## 2013-02-15 LAB — POCT I-STAT, CHEM 8
Chloride: 112 mEq/L (ref 96–112)
HCT: 45 % (ref 36.0–46.0)
Potassium: 3.8 mEq/L (ref 3.5–5.1)

## 2013-02-15 LAB — ETHANOL: Alcohol, Ethyl (B): 116 mg/dL — ABNORMAL HIGH (ref 0–11)

## 2013-02-15 MED ORDER — LORAZEPAM 2 MG/ML IJ SOLN
0.5000 mg | Freq: Once | INTRAMUSCULAR | Status: AC
  Administered 2013-02-15: 0.5 mg via INTRAMUSCULAR

## 2013-02-15 MED ORDER — LORAZEPAM 2 MG/ML IJ SOLN
INTRAMUSCULAR | Status: AC
  Filled 2013-02-15: qty 1

## 2013-02-15 NOTE — ED Provider Notes (Signed)
History     CSN: [id number]  Arrival date & time 02/15/13  1227   First MD Initiated Contact with Patient 02/15/13 1253      Chief Complaint  Patient presents with  . Panic Attack    (Consider location/radiation/quality/duration/timing/severity/associated sxs/prior treatment) HPI Comments: Patient presents to urgent care this morning described that she was watching  TV this morning when she suddenly started experiencing uncontrollable shaking feeling anxious and short of breath, and tearful for no reason. She describes that she has had anxiety attacks in the past but it's been many many months without an inciting attack. Patient denies any abdominal pain, diarrheas or vomiting but does describe that she feels a bit anxious. She describes that about a week ago her psychiatrist decrease her dose of Zoloft to 50 mg daily from 100 mg daily. She has an appointment to see her psychiatrist tomorrow in the afternoon. Patient denies using any illicit drugs but does relate that she drinks about 3-4 glasses of wine every night. Denies having had any alcoholic drinks since last night.  The history is provided by the patient.    Past Medical History  Diagnosis Date  . Allergy   . Anxiety   . Asthma   . Depression     posibly bipolar disorder. Sees Dr [name] for psychiatric care and Dr [name] for therapy  . Headache     last migraine 2004  . Hypertension     never on meds     Past Surgical History  Procedure Laterality Date  . Benign cyst      removed from left cheek  . Tonsilectomy, adenoidectomy, bilateral myringotomy and tubes      Family History  Problem Relation Age of Onset  . Coronary artery disease      1st degree relative female <50  . Depression      Family Hx  . Diabetes      Family Hx 1st degree relative  . Ovarian cancer      Family Hx     History  Substance Use Topics  . Smoking status: Current Every Day Smoker -- 0.30 packs/day    Types: Cigarettes   . Smokeless tobacco: Never Used     Comment: Using the E cigarette to help her quit & still smoking  . Alcohol Use: 3.5 oz/week    7 drink(s) per week    OB History   Grav Para Term Preterm Abortions TAB SAB Ect Mult Living                  Review of Systems  Constitutional: Positive for activity change. Negative for fever, chills, diaphoresis, fatigue and unexpected weight change.  HENT: Negative for congestion.   Respiratory: Positive for shortness of breath. Negative for cough, wheezing and stridor.   Gastrointestinal: Negative for vomiting, abdominal pain, diarrhea, blood in stool and rectal pain.  Endocrine: Negative for cold intolerance, heat intolerance, polydipsia, polyphagia and polyuria.  Musculoskeletal: Negative for gait problem.  Skin: Negative for color change and pallor.  Neurological: Negative for dizziness, weakness, light-headedness, numbness and headaches.    Allergies  Acetaminophen and Lithium  Home Medications   Current Outpatient Rx  Name  Route  Sig  Dispense  Refill  . albuterol (PROVENTIL HFA;VENTOLIN HFA) 108 (90 BASE) MCG/ACT inhaler   Inhalation   Inhale 2 puffs into the lungs every 4 (four) hours as needed. For shortness of breath   3 Inhaler   1   .  beclomethasone (QVAR) 40 MCG/ACT inhaler   Inhalation   Inhale 2 puffs into the lungs at bedtime.   3 Inhaler   1   . dicyclomine (BENTYL) 20 MG tablet   Oral   Take 1 tablet (20 mg total) by mouth 3 (three) times daily.   90 tablet   11   . montelukast (SINGULAIR) 10 MG tablet   Oral   Take 1 tablet (10 mg total) by mouth at bedtime.   90 tablet   1   . sertraline (ZOLOFT) 100 MG tablet   Oral   Take 100 mg by mouth daily.         Marland Kitchen triamcinolone (NASACORT) 55 MCG/ACT nasal inhaler   Nasal   Place 2 sprays into the nose daily.   1 Inhaler   11   . azithromycin (ZITHROMAX) 250 MG tablet      As directed   6 tablet   0   . clonazePAM (KLONOPIN) 0.5 MG tablet   Oral    Take 0.5 mg by mouth 2 (two) times daily as needed for anxiety.         Marland Kitchen EXPIRED: SUMAtriptan (IMITREX) 50 MG tablet   Oral   Take 50 mg by mouth daily as needed. For migraines            BP 129/89  Pulse 108  Temp(Src) 97.3 F (36.3 C) (Oral)  Resp 18  SpO2 100%  Physical Exam  Nursing note and vitals reviewed. Constitutional: She is oriented to person, place, and time. She appears distressed.  HENT:  Head: Normocephalic.  Eyes: No scleral icterus.  Neck: Neck supple. No JVD present.  Cardiovascular: Normal rate, regular rhythm and normal heart sounds.  Exam reveals no gallop and no friction rub.   No murmur heard. Pulmonary/Chest: Effort normal and breath sounds normal.  At times patient seems to start hyperventilating. But she pauses  Neurological: She is alert and oriented to person, place, and time.  Mild global generalized tremor both at rest and with movement  Skin: She is not diaphoretic. No erythema.  Psychiatric: Her speech is normal. Judgment and thought content normal. Her mood appears anxious. She is agitated and hyperactive. She is not slowed, not withdrawn, not actively hallucinating and not combative. Cognition and memory are normal. Cognition and memory are not impaired. She does not express impulsivity or inappropriate judgment. She exhibits normal recent memory. She is attentive.    ED Course  Procedures (including critical care time)  Labs Reviewed  ETHANOL - Abnormal; Notable for the following:    Alcohol, Ethyl (B) 116 (*)    All other components within normal limits  POCT I-STAT, CHEM 8 - Abnormal; Notable for the following:    Calcium, Ion 1.09 (*)    Hemoglobin 15.3 (*)    All other components within normal limits   No results found.   1. Anxiety disorder     The symptoms resolved mostly after 0.5mg   x IM of ativan.  MDM  Anxiety related symptoms with most likely an aside he or panic attack. Questionable alcohol consumption. We are  performing an i-STAT as well as and at the level. Patient will be given 0.5 of Ativan, she has recently titrated down from her Zoloft to 50 mg. She has an appointment to see her psychiatrist tomorrow. No particular symptomatology to suggest an acute cardiovascular or pulmonary event. Patient is currently denying any chest pain.        Jimmie Molly,  MD 02/15/13 2034

## 2013-02-15 NOTE — ED Notes (Addendum)
States she has been having, anxiety , shaking, seating for aprox 1 H PTA. Presently , shaking, hyperventilating, tearful. Skin w/d/color good. Out of her klonopin, has an appt to see her psychiatrist tomorrow at 1:30

## 2013-02-16 ENCOUNTER — Emergency Department (HOSPITAL_COMMUNITY)
Admission: EM | Admit: 2013-02-16 | Discharge: 2013-02-17 | Disposition: A | Discharge status: Other Acute Inpatient Hospital | Payer: 59 | Attending: Emergency Medicine | Admitting: Emergency Medicine

## 2013-02-16 ENCOUNTER — Encounter (HOSPITAL_COMMUNITY): Payer: Self-pay

## 2013-02-16 DIAGNOSIS — F32A Depression, unspecified: Secondary | ICD-10-CM

## 2013-02-16 DIAGNOSIS — F411 Generalized anxiety disorder: Secondary | ICD-10-CM | POA: Insufficient documentation

## 2013-02-16 DIAGNOSIS — F329 Major depressive disorder, single episode, unspecified: Secondary | ICD-10-CM | POA: Insufficient documentation

## 2013-02-16 DIAGNOSIS — R45851 Suicidal ideations: Secondary | ICD-10-CM

## 2013-02-16 DIAGNOSIS — J45909 Unspecified asthma, uncomplicated: Secondary | ICD-10-CM | POA: Insufficient documentation

## 2013-02-16 DIAGNOSIS — Z79899 Other long term (current) drug therapy: Secondary | ICD-10-CM | POA: Insufficient documentation

## 2013-02-16 DIAGNOSIS — F17200 Nicotine dependence, unspecified, uncomplicated: Secondary | ICD-10-CM | POA: Insufficient documentation

## 2013-02-16 DIAGNOSIS — F3289 Other specified depressive episodes: Secondary | ICD-10-CM | POA: Insufficient documentation

## 2013-02-16 DIAGNOSIS — I10 Essential (primary) hypertension: Secondary | ICD-10-CM | POA: Insufficient documentation

## 2013-02-16 DIAGNOSIS — G43909 Migraine, unspecified, not intractable, without status migrainosus: Secondary | ICD-10-CM | POA: Insufficient documentation

## 2013-02-16 DIAGNOSIS — F419 Anxiety disorder, unspecified: Secondary | ICD-10-CM

## 2013-02-16 LAB — RAPID URINE DRUG SCREEN, HOSP PERFORMED
Amphetamines: NOT DETECTED
Barbiturates: NOT DETECTED
Cocaine: NOT DETECTED
Tetrahydrocannabinol: NOT DETECTED

## 2013-02-16 LAB — ETHANOL: Alcohol, Ethyl (B): 259 mg/dL — ABNORMAL HIGH (ref 0–11)

## 2013-02-16 LAB — CBC WITH DIFFERENTIAL/PLATELET
Basophils Absolute: 0.1 10*3/uL (ref 0.0–0.1)
HCT: 38.8 % (ref 36.0–46.0)
Hemoglobin: 13.3 g/dL (ref 12.0–15.0)
Lymphocytes Relative: 22 % (ref 12–46)
Monocytes Absolute: 0.7 10*3/uL (ref 0.1–1.0)
Monocytes Relative: 10 % (ref 3–12)
Neutro Abs: 4.4 10*3/uL (ref 1.7–7.7)
WBC: 7 10*3/uL (ref 4.0–10.5)

## 2013-02-16 LAB — COMPREHENSIVE METABOLIC PANEL
ALT: 150 U/L — ABNORMAL HIGH (ref 0–35)
Albumin: 4.1 g/dL (ref 3.5–5.2)
Alkaline Phosphatase: 172 U/L — ABNORMAL HIGH (ref 39–117)
BUN: 7 mg/dL (ref 6–23)
Chloride: 93 mEq/L — ABNORMAL LOW (ref 96–112)
Glucose, Bld: 62 mg/dL — ABNORMAL LOW (ref 70–99)
Potassium: 3.9 mEq/L (ref 3.5–5.1)
Total Bilirubin: 0.5 mg/dL (ref 0.3–1.2)

## 2013-02-16 LAB — GLUCOSE, CAPILLARY
Glucose-Capillary: 53 mg/dL — ABNORMAL LOW (ref 70–99)
Glucose-Capillary: 84 mg/dL (ref 70–99)

## 2013-02-16 MED ORDER — CLONAZEPAM 0.5 MG PO TABS
0.5000 mg | ORAL_TABLET | Freq: Two times a day (BID) | ORAL | Status: DC
  Administered 2013-02-16 – 2013-02-17 (×2): 0.5 mg via ORAL
  Filled 2013-02-16 (×2): qty 1

## 2013-02-16 MED ORDER — SUMATRIPTAN SUCCINATE 50 MG PO TABS
50.0000 mg | ORAL_TABLET | ORAL | Status: DC | PRN
  Filled 2013-02-16: qty 1

## 2013-02-16 MED ORDER — ALUM & MAG HYDROXIDE-SIMETH 200-200-20 MG/5ML PO SUSP
30.0000 mL | ORAL | Status: DC | PRN
Start: 2013-02-16 — End: 2013-02-17

## 2013-02-16 MED ORDER — IBUPROFEN 600 MG PO TABS: 600.0000 mg | ORAL_TABLET | Freq: Three times a day (TID) | ORAL | Status: DC | PRN

## 2013-02-16 MED ORDER — NICOTINE 21 MG/24HR TD PT24
21.0000 mg | MEDICATED_PATCH | Freq: Every day | TRANSDERMAL | Status: DC
  Administered 2013-02-16 – 2013-02-17 (×2): 21 mg via TRANSDERMAL
  Filled 2013-02-16 (×2): qty 1

## 2013-02-16 MED ORDER — MONTELUKAST SODIUM 10 MG PO TABS
10.0000 mg | ORAL_TABLET | Freq: Every day | ORAL | Status: DC
  Administered 2013-02-16 – 2013-02-17 (×2): 10 mg via ORAL
  Filled 2013-02-16 (×2): qty 1

## 2013-02-16 MED ORDER — FLUTICASONE PROPIONATE HFA 44 MCG/ACT IN AERO
1.0000 | INHALATION_SPRAY | Freq: Two times a day (BID) | RESPIRATORY_TRACT | Status: DC
  Administered 2013-02-16 – 2013-02-17 (×3): 1 via RESPIRATORY_TRACT
  Filled 2013-02-16: qty 10.6

## 2013-02-16 MED ORDER — DICYCLOMINE HCL 20 MG PO TABS
20.0000 mg | ORAL_TABLET | Freq: Three times a day (TID) | ORAL | Status: DC
  Administered 2013-02-16 – 2013-02-17 (×4): 20 mg via ORAL
  Filled 2013-02-16 (×4): qty 1

## 2013-02-16 MED ORDER — ALBUTEROL SULFATE HFA 108 (90 BASE) MCG/ACT IN AERS: 2.0000 | INHALATION_SPRAY | RESPIRATORY_TRACT | Status: DC | PRN

## 2013-02-16 MED ORDER — LORAZEPAM 1 MG PO TABS
1.0000 mg | ORAL_TABLET | Freq: Three times a day (TID) | ORAL | Status: DC | PRN
  Administered 2013-02-16: 1 mg via ORAL
  Filled 2013-02-16: qty 1

## 2013-02-16 MED ORDER — VENLAFAXINE HCL ER 75 MG PO CP24
75.0000 mg | ORAL_CAPSULE | Freq: Every day | ORAL | Status: DC
  Administered 2013-02-17: 75 mg via ORAL
  Filled 2013-02-16 (×2): qty 1

## 2013-02-16 MED ORDER — ZOLPIDEM TARTRATE 5 MG PO TABS
5.0000 mg | ORAL_TABLET | Freq: Every evening | ORAL | Status: DC | PRN
  Administered 2013-02-16: 5 mg via ORAL
  Filled 2013-02-16: qty 1

## 2013-02-16 MED ORDER — ONDANSETRON HCL 4 MG PO TABS: 4.0000 mg | ORAL_TABLET | Freq: Three times a day (TID) | ORAL | Status: DC | PRN

## 2013-02-16 MED ORDER — FLUTICASONE PROPIONATE 50 MCG/ACT NA SUSP
1.0000 | Freq: Every day | NASAL | Status: DC
  Administered 2013-02-16 – 2013-02-17 (×2): 1 via NASAL
  Filled 2013-02-16: qty 16

## 2013-02-16 NOTE — ED Notes (Signed)
Belongings bag x 1 locked in locker #40.

## 2013-02-16 NOTE — ED Notes (Signed)
telepsych in progress 

## 2013-02-16 NOTE — ED Notes (Signed)
Pt  Ate most of her supper

## 2013-02-16 NOTE — ED Notes (Signed)
Patient reports that she is very anxious and depressed. Patient reports that she was having shakes and anxiety yesterday. Patient states that she was seen at [facility] and stated that her "heart was OK." Today patient went to [facility] today and was told to come to the ED for suicidal thoughts. Paatient states she has a plan to shoot herself. Patient is very tearful and anxious.

## 2013-02-16 NOTE — ED Notes (Signed)
Dr Juleen China aware of [facility]

## 2013-02-16 NOTE — ED Notes (Signed)
Pt reports she has not eaten since breakfast yesterday

## 2013-02-16 NOTE — ED Provider Notes (Addendum)
Consultation was reviewed. Recommending inpatient psychiatric care. Will IVC patient. Medications ordered per recommendations aside from effexor instead of pristiq as it is non-formulary. Raeford Razor, MD 02/16/13 1950  Raeford Razor, MD 02/16/13 [phone]

## 2013-02-16 NOTE — ED Notes (Signed)
ACT consult being done now.

## 2013-02-16 NOTE — ED Notes (Addendum)
Snack given-Lab blood sugar verified w/ [facility]

## 2013-02-16 NOTE — ED Notes (Signed)
Up to the bathroom 
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
## 2013-02-16 NOTE — ED Provider Notes (Signed)
History     CSN: [id number]  Arrival date & time 02/16/13  1450   First MD Initiated Contact with Patient 02/16/13 1529      Chief Complaint  Patient presents with  . Medical Clearance    (Consider location/radiation/quality/duration/timing/severity/associated sxs/prior treatment) HPI Comments: 47 year old female presents to the emergency department with her father for medical clearance. Patient had an anxiety attack yesterday and went to urgent care. She was given a shot of Ativan, went home and this morning went to see her counselor. Her counselor advised her to come to the emergency department due to patient expressing suicidal ideations. Patient states she has a gun at home and was planning to shoot herself. Currently the gun is at her home with her husband. Husband is unaware that patient is in the emergency department right now. She was hospitalized at the age of 83 for suicidal ideations. Admits to alcohol use nightly with "a few glasses of wine". Denies any other drug use. Denies homicidal ideations.  The history is provided by the patient.    Past Medical History  Diagnosis Date  . Allergy   . Anxiety   . Asthma   . Depression     posibly bipolar disorder. Sees Dr [name] for psychiatric care and Dr [name] for therapy  . Headache     last migraine 2004  . Hypertension     never on meds     Past Surgical History  Procedure Laterality Date  . Benign cyst      removed from left cheek  . Tonsilectomy, adenoidectomy, bilateral myringotomy and tubes      Family History  Problem Relation Age of Onset  . Coronary artery disease      1st degree relative female <50  . Depression      Family Hx  . Diabetes      Family Hx 1st degree relative  . Ovarian cancer      Family Hx     History  Substance Use Topics  . Smoking status: Current Every Day Smoker -- 0.50 packs/day    Types: Cigarettes  . Smokeless tobacco: Never Used     Comment: Using the E  cigarette to help her quit & still smoking  . Alcohol Use: 0.0 oz/week     Comment: daily use-4 glasses wine     OB History   Grav Para Term Preterm Abortions TAB SAB Ect Mult Living                  Review of Systems  Psychiatric/Behavioral: Positive for suicidal ideas, sleep disturbance and dysphoric mood. Negative for self-injury. The patient is nervous/anxious.   All other systems reviewed and are negative.    Allergies  Acetaminophen and Lithium  Home Medications   Current Outpatient Rx  Name  Route  Sig  Dispense  Refill  . albuterol (PROVENTIL HFA;VENTOLIN HFA) 108 (90 BASE) MCG/ACT inhaler   Inhalation   Inhale 2 puffs into the lungs every 4 (four) hours as needed. For shortness of breath   3 Inhaler   1   . beclomethasone (QVAR) 40 MCG/ACT inhaler   Inhalation   Inhale 2 puffs into the lungs at bedtime.   3 Inhaler   1   . clonazePAM (KLONOPIN) 0.5 MG tablet   Oral   Take 0.5 mg by mouth 2 (two) times daily as needed for anxiety.         . dicyclomine (BENTYL) 20 MG tablet  Oral   Take 1 tablet (20 mg total) by mouth 3 (three) times daily.   90 tablet   11   . montelukast (SINGULAIR) 10 MG tablet   Oral   Take 1 tablet (10 mg total) by mouth at bedtime.   90 tablet   1   . Multiple Vitamin (MULTIVITAMIN WITH MINERALS) TABS   Oral   Take 1 tablet by mouth daily.         . sertraline (ZOLOFT) 50 MG tablet   Oral   Take 50 mg by mouth daily.         Marland Kitchen triamcinolone (NASACORT) 55 MCG/ACT nasal inhaler   Nasal   Place 2 sprays into the nose daily.   1 Inhaler   11   . EXPIRED: SUMAtriptan (IMITREX) 50 MG tablet   Oral   Take 50 mg by mouth daily as needed. For migraines            BP 136/81  Pulse 116  Temp(Src) 98.7 F (37.1 C) (Oral)  Resp 20  SpO2 97%  LMP 02/16/2013  Physical Exam  Nursing note and vitals reviewed. Constitutional: She is oriented to person, place, and time. She appears well-developed and  well-nourished. No distress.  Tearful  HENT:  Head: Normocephalic and atraumatic.  Eyes: Conjunctivae and EOM are normal. Pupils are equal, round, and reactive to light.  Neck: Normal range of motion. Neck supple.  Cardiovascular: Normal rate, regular rhythm and normal heart sounds.   Pulmonary/Chest: Effort normal and breath sounds normal.  Abdominal: Soft. Bowel sounds are normal. There is no tenderness.  Musculoskeletal: Normal range of motion. She exhibits no edema.  Neurological: She is alert and oriented to person, place, and time.  Skin: Skin is warm and dry.  Psychiatric: Her speech is normal. Her mood appears anxious. She exhibits a depressed mood. She expresses suicidal ideation. She expresses no homicidal ideation. She expresses suicidal plans. She expresses no homicidal plans.    ED Course  Procedures (including critical care time)  Labs Reviewed  CBC WITH DIFFERENTIAL - Abnormal; Notable for the following:    RBC 3.51 (*)    MCV 110.5 (*)    MCH 37.9 (*)    All other components within normal limits  COMPREHENSIVE METABOLIC PANEL  ETHANOL  ACETAMINOPHEN LEVEL  SALICYLATE LEVEL  URINE RAPID DRUG SCREEN (HOSP PERFORMED)   No results found.   1. Anxiety   2. Depression   3. Suicidal ideation       MDM  47 y/o female with SI. Telepsych ordered. Awaiting ACT team consult. 4:05 PM ACT team aware of patient and will evaluate.       Trevor Mace, PA-C 02/17/13 [phone]

## 2013-02-17 ENCOUNTER — Inpatient Hospital Stay (HOSPITAL_COMMUNITY)
Admission: AD | Admit: 2013-02-17 | Discharge: 2013-02-23 | DRG: 897 | Disposition: A | Discharge status: Home / Self Care | Payer: 59 | Source: Intra-hospital | Attending: Psychiatry | Admitting: Psychiatry

## 2013-02-17 DIAGNOSIS — F10239 Alcohol dependence with withdrawal, unspecified: Secondary | ICD-10-CM

## 2013-02-17 DIAGNOSIS — F411 Generalized anxiety disorder: Secondary | ICD-10-CM | POA: Diagnosis present

## 2013-02-17 DIAGNOSIS — J45909 Unspecified asthma, uncomplicated: Secondary | ICD-10-CM | POA: Diagnosis present

## 2013-02-17 DIAGNOSIS — F419 Anxiety disorder, unspecified: Secondary | ICD-10-CM | POA: Diagnosis present

## 2013-02-17 DIAGNOSIS — Z79899 Other long term (current) drug therapy: Secondary | ICD-10-CM

## 2013-02-17 DIAGNOSIS — F1010 Alcohol abuse, uncomplicated: Secondary | ICD-10-CM

## 2013-02-17 DIAGNOSIS — F1910 Other psychoactive substance abuse, uncomplicated: Secondary | ICD-10-CM | POA: Diagnosis present

## 2013-02-17 DIAGNOSIS — I10 Essential (primary) hypertension: Secondary | ICD-10-CM | POA: Diagnosis present

## 2013-02-17 DIAGNOSIS — F329 Major depressive disorder, single episode, unspecified: Secondary | ICD-10-CM

## 2013-02-17 DIAGNOSIS — F1020 Alcohol dependence, uncomplicated: Principal | ICD-10-CM | POA: Diagnosis present

## 2013-02-17 MED ORDER — ADULT MULTIVITAMIN W/MINERALS CH
1.0000 | ORAL_TABLET | Freq: Every day | ORAL | Status: DC
  Administered 2013-02-17: 1 via ORAL
  Filled 2013-02-17: qty 1

## 2013-02-17 MED ORDER — CHLORDIAZEPOXIDE HCL 25 MG PO CAPS
25.0000 mg | ORAL_CAPSULE | Freq: Once | ORAL | Status: AC
  Administered 2013-02-17: 25 mg via ORAL

## 2013-02-17 MED ORDER — TRAZODONE HCL 50 MG PO TABS
50.0000 mg | ORAL_TABLET | Freq: Every day | ORAL | Status: DC
  Administered 2013-02-17: 50 mg via ORAL
  Filled 2013-02-17: qty 1

## 2013-02-17 MED ORDER — LOPERAMIDE HCL 2 MG PO CAPS
2.0000 mg | ORAL_CAPSULE | ORAL | Status: DC | PRN
  Administered 2013-02-17: 4 mg via ORAL
  Filled 2013-02-17: qty 2

## 2013-02-17 MED ORDER — CHLORDIAZEPOXIDE HCL 25 MG PO CAPS
25.0000 mg | ORAL_CAPSULE | Freq: Four times a day (QID) | ORAL | Status: DC | PRN
  Filled 2013-02-17: qty 1

## 2013-02-17 MED ORDER — THIAMINE HCL 100 MG/ML IJ SOLN
100.0000 mg | Freq: Once | INTRAMUSCULAR | Status: AC
  Administered 2013-02-17: 100 mg via INTRAMUSCULAR
  Filled 2013-02-17: qty 2

## 2013-02-17 MED ORDER — HYDROXYZINE HCL 25 MG PO TABS: 25.0000 mg | ORAL_TABLET | Freq: Four times a day (QID) | ORAL | Status: DC | PRN

## 2013-02-17 MED ORDER — ONDANSETRON 4 MG PO TBDP: 4.0000 mg | ORAL_TABLET | Freq: Four times a day (QID) | ORAL | Status: DC | PRN

## 2013-02-17 MED ORDER — VITAMIN B-1 100 MG PO TABS: 100.0000 mg | ORAL_TABLET | Freq: Every day | ORAL | Status: DC

## 2013-02-17 MED ORDER — LORAZEPAM 1 MG PO TABS
1.0000 mg | ORAL_TABLET | Freq: Once | ORAL | Status: AC
  Administered 2013-02-17: 1 mg via ORAL
  Filled 2013-02-17: qty 1

## 2013-02-17 NOTE — ED Notes (Signed)
Pt very tearful and anxious about going to [facility], she was there when [facility] was [facility] when she was 47 y.o. D/t +si and she said it was a horrible experience. Apparently, she kept in touch with her roommate from there and she killed herself after being discharged [facility]. Pt is fearful of going to [facility]. Writer tried to reassure pt that the facility has changed and there's lots of good people that work there that really care about the pt's and my hope for her is that she sees that right away. Explained to pt to work with her people on her team, her psychiatrist, her counselor and attend the groups with participation as well. Called the EDP to get pt something for anxiety. Medicated pt for diarrhea. Will be sending nasal spray and inhaler with pt to [facility] with her.

## 2013-02-17 NOTE — ED Notes (Signed)
Up to bathroom

## 2013-02-17 NOTE — [facility] Counselor (Signed)
Patient is to be evaluated for placement by Dr. Elsie Saas in the morning, due to conflicting information in the patient's assessment and in the telepsych evaluation.

## 2013-02-17 NOTE — Consult Note (Signed)
Reason for Consult:panic attack, depression and suicidal ideation with plan of shooting herself Referring Physician: Dr. Talmage Nap Kelly Zuniga is an 47 y.o. female.  HPI: Patient was seen and chart reviewed. Patient came to the [facility] long emergency department with the panic symptoms following alcohol intoxication. Patient has been suffering with depression and anxiety disorder since age 39 years old. Patient has been treated by local psychiatrist office who has changed her antidepressant medication 100 mg to 50 mg because of questionable tolerability. Patient primary provider send her to the [facility] urgent care with panic episode and than received ativan shot and referred back two days ago. She was referred to the [facility] long emergency department for second panic attack. Patient reported she is trying to control her depression and anxiety when drinking the alcohol because she does not like taking her pills. Reportedly she has been taking psychiatric medication over several years. Patient endorses suicidal thoughts and plan of killing herself with the gun. Patient was trained to use gun and owns a gun. 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 Patient has a previous acute psychiatric hospitalization at the [facility] in [place] about 20 years ago for depression and suicidal attempt. Patient does not want to be admitted to the behavioral health because of her past memories.  Mental status examination: Patient was anxious and cooperative she has shaky hands and decreased psychomotor activity. Patient stated mood was depressed and anxious her affect was appropriate she has normal rate and rhythm but low-volume of speech. Her thought processes is linear and goal-directed. She has suicidal thoughts intentions or plans. Patient has denied onset ideation intents  or plan. Patient has no evidence of psychotic symptoms.   Past Medical History  Diagnosis Date  . Allergy   . Anxiety   . Asthma   . Depression     posibly bipolar disorder. Sees Dr [name] for psychiatric care and Dr [name] for therapy  . Headache     last migraine 2004  . Hypertension     never on meds     Past Surgical History  Procedure Laterality Date  . Benign cyst      removed from left cheek  . Tonsilectomy, adenoidectomy, bilateral myringotomy and tubes      Family History  Problem Relation Age of Onset  . Coronary artery disease      1st degree relative female <50  . Depression      Family Hx  . Diabetes      Family Hx 1st degree relative  . Ovarian cancer      Family Hx     Social History:  reports that she has been smoking Cigarettes.  She has been smoking about 0.50 packs per day. She has never used smokeless tobacco. She reports that  drinks alcohol. She reports that she does not use illicit drugs.  Allergies:  Allergies  Allergen Reactions  . Acetaminophen     Makes LFT gets elevated  . Lithium     REACTION: no appetite    Medications:I have reviewed the patient's current medications.}  Results for orders placed during the hospital encounter of 02/16/13 (from the past 48 hour(s))  CBC WITH DIFFERENTIAL     Status: Abnormal   Collection Time    02/16/13  3:07 PM  Result Value Range   WBC 7.0  4.0 - 10.5 K/uL   RBC 3.51 (*) 3.87 - 5.11 MIL/uL   Hemoglobin 13.3  12.0 - 15.0 g/dL   HCT 45.4  09.8 - 11.9 %   MCV 110.5 (*) 78.0 - 100.0 fL   MCH 37.9 (*) 26.0 - 34.0 pg   MCHC 34.3  30.0 - 36.0 g/dL   RDW 14.7  82.9 - 56.2 %   Platelets 234  150 - 400 K/uL   Neutrophils Relative 63  43 - 77 %   Neutro Abs 4.4  1.7 - 7.7 K/uL   Lymphocytes Relative 22  12 - 46 %   Lymphs Abs 1.5  0.7 - 4.0 K/uL   Monocytes Relative 10  3 - 12 %   Monocytes Absolute 0.7  0.1 - 1.0 K/uL   Eosinophils Relative 4  0 - 5 %   Eosinophils Absolute 0.3   0.0 - 0.7 K/uL   Basophils Relative 1  0 - 1 %   Basophils Absolute 0.1  0.0 - 0.1 K/uL   Smear Review MORPHOLOGY UNREMARKABLE    COMPREHENSIVE METABOLIC PANEL     Status: Abnormal   Collection Time    02/16/13  3:07 PM      Result Value Range   Sodium 131 (*) 135 - 145 mEq/L   Potassium 3.9  3.5 - 5.1 mEq/L   Chloride 93 (*) 96 - 112 mEq/L   CO2 17 (*) 19 - 32 mEq/L   Glucose, Bld 62 (*) 70 - 99 mg/dL   BUN 7  6 - 23 mg/dL   Creatinine, Ser 1.30  0.50 - 1.10 mg/dL   Calcium 8.8  8.4 - 86.5 mg/dL   Total Protein 7.2  6.0 - 8.3 g/dL   Albumin 4.1  3.5 - 5.2 g/dL   AST 784 (*) 0 - 37 U/L   ALT 150 (*) 0 - 35 U/L   Alkaline Phosphatase 172 (*) 39 - 117 U/L   Total Bilirubin 0.5  0.3 - 1.2 mg/dL   GFR calc non Af Amer >90  >90 mL/min   GFR calc Af Amer >90  >90 mL/min   Comment:            The eGFR has been calculated     using the CKD EPI equation.     This calculation has not been     validated in all clinical     situations.     eGFR's persistently     <90 mL/min signify     possible Chronic Kidney Disease.  ETHANOL     Status: Abnormal   Collection Time    02/16/13  3:07 PM      Result Value Range   Alcohol, Ethyl (B) 259 (*) 0 - 11 mg/dL   Comment:            LOWEST DETECTABLE LIMIT FOR     SERUM ALCOHOL IS 11 mg/dL     FOR MEDICAL PURPOSES ONLY  ACETAMINOPHEN LEVEL     Status: None   Collection Time    02/16/13  3:07 PM      Result Value Range   Acetaminophen (Tylenol), Serum <15.0  10 - 30 ug/mL   Comment:            THERAPEUTIC CONCENTRATIONS VARY     SIGNIFICANTLY. A RANGE OF 10-30     ug/mL MAY BE AN EFFECTIVE     CONCENTRATION FOR MANY PATIENTS.  HOWEVER, SOME ARE BEST TREATED     AT CONCENTRATIONS OUTSIDE THIS     RANGE.     ACETAMINOPHEN CONCENTRATIONS     >150 ug/mL AT 4 HOURS AFTER     INGESTION AND >50 ug/mL AT 12     HOURS AFTER INGESTION ARE     OFTEN ASSOCIATED WITH TOXIC     REACTIONS.  SALICYLATE LEVEL     Status: Abnormal   Collection  Time    02/16/13  3:07 PM      Result Value Range   Salicylate Lvl <2.0 (*) 2.8 - 20.0 mg/dL  URINE RAPID DRUG SCREEN (HOSP PERFORMED)     Status: None   Collection Time    02/16/13  3:17 PM      Result Value Range   Opiates NONE DETECTED  NONE DETECTED   Cocaine NONE DETECTED  NONE DETECTED   Benzodiazepines NONE DETECTED  NONE DETECTED   Amphetamines NONE DETECTED  NONE DETECTED   Tetrahydrocannabinol NONE DETECTED  NONE DETECTED   Barbiturates NONE DETECTED  NONE DETECTED   Comment:            DRUG SCREEN FOR MEDICAL PURPOSES     ONLY.  IF CONFIRMATION IS NEEDED     FOR ANY PURPOSE, NOTIFY LAB     WITHIN 5 DAYS.                LOWEST DETECTABLE LIMITS     FOR URINE DRUG SCREEN     Drug Class       Cutoff (ng/mL)     Amphetamine      1000     Barbiturate      200     Benzodiazepine   200     Tricyclics       300     Opiates          300     Cocaine          300     THC              50  GLUCOSE, CAPILLARY     Status: Abnormal   Collection Time    02/16/13  4:59 PM      Result Value Range   Glucose-Capillary 53 (*) 70 - 99 mg/dL  GLUCOSE, CAPILLARY     Status: None   Collection Time    02/16/13  5:29 PM      Result Value Range   Glucose-Capillary 84  70 - 99 mg/dL    No results found.  Positive for anxiety, bad mood, behavior problems, excessive alcohol consumption, illegal drug usage, learning difficulty and mood swings Blood pressure 116/78, pulse 96, temperature 98.4 F (36.9 C), temperature source Oral, resp. rate 18, last menstrual period 02/16/2013, SpO2 99.00%.   Assessment/Plan: Alcohol intoxication and withdrawal Alcohol abuse  Recommendation: Patient meets criteria for acute psychiatric hospitalization for detox treatment for alcohol withdrawal symptoms and medication management for depression and anxiety. Patient will be started detox program for alcohol and lives such for the placement outside behavioral health.  Dyann Goodspeed,JANARDHAHA  R. 02/17/2013, 11:01 AM

## 2013-02-17 NOTE — ED Notes (Signed)
Pt aaox3.  Pt denies si/hi.  Pt reports "i only feel like hurting myself when i am in panic mode."

## 2013-02-17 NOTE — ED Provider Notes (Signed)
Pt stable awaiting placement  Kelly Lennert, MD 02/17/13 [phone]

## 2013-02-17 NOTE — ED Notes (Signed)
[facility] contacted to transport pt to [facility]. 

## 2013-02-17 NOTE — ED Provider Notes (Signed)
Medical screening examination/treatment/procedure(s) were performed by non-physician practitioner and as supervising physician I was immediately available for consultation/collaboration. Essie Gehret, MD, FACEP   Benicia Bergevin L Ethelwyn Gilbertson, MD 02/17/13 [id number] 

## 2013-02-17 NOTE — ED Notes (Signed)
[facility] transported pt across to [facility]

## 2013-02-17 NOTE — [facility] Counselor (Signed)
Pt accepted to [facility] adult unit 300-1, Dr. Elsie Saas to Dr. Dub Mikes. Ranae Pila, LCAS

## 2013-02-18 ENCOUNTER — Encounter (HOSPITAL_COMMUNITY): Payer: Self-pay | Admitting: *Deleted

## 2013-02-18 DIAGNOSIS — F1910 Other psychoactive substance abuse, uncomplicated: Secondary | ICD-10-CM | POA: Diagnosis present

## 2013-02-18 DIAGNOSIS — F411 Generalized anxiety disorder: Secondary | ICD-10-CM

## 2013-02-18 DIAGNOSIS — F1020 Alcohol dependence, uncomplicated: Principal | ICD-10-CM

## 2013-02-18 MED ORDER — FLUTICASONE PROPIONATE 50 MCG/ACT NA SUSP
1.0000 | Freq: Every day | NASAL | Status: DC
  Administered 2013-02-18 – 2013-02-23 (×6): 1 via NASAL
  Filled 2013-02-18: qty 16

## 2013-02-18 MED ORDER — LOPERAMIDE HCL 2 MG PO CAPS
4.0000 mg | ORAL_CAPSULE | ORAL | Status: DC | PRN
  Administered 2013-02-18: 4 mg via ORAL

## 2013-02-18 MED ORDER — VENLAFAXINE HCL ER 75 MG PO CP24
75.0000 mg | ORAL_CAPSULE | Freq: Every day | ORAL | Status: DC
  Administered 2013-02-19 – 2013-02-23 (×5): 75 mg via ORAL
  Filled 2013-02-18 (×3): qty 1
  Filled 2013-02-18: qty 4
  Filled 2013-02-18 (×3): qty 1

## 2013-02-18 MED ORDER — MONTELUKAST SODIUM 10 MG PO TABS
10.0000 mg | ORAL_TABLET | Freq: Every day | ORAL | Status: DC
  Administered 2013-02-18 – 2013-02-22 (×5): 10 mg via ORAL
  Filled 2013-02-18 (×4): qty 1
  Filled 2013-02-18: qty 4
  Filled 2013-02-18 (×2): qty 1

## 2013-02-18 MED ORDER — ALUM & MAG HYDROXIDE-SIMETH 200-200-20 MG/5ML PO SUSP: 30.0000 mL | ORAL | Status: DC | PRN

## 2013-02-18 MED ORDER — ALBUTEROL SULFATE HFA 108 (90 BASE) MCG/ACT IN AERS: 2.0000 | INHALATION_SPRAY | RESPIRATORY_TRACT | Status: DC | PRN

## 2013-02-18 MED ORDER — ONDANSETRON 4 MG PO TBDP: 4.0000 mg | ORAL_TABLET | Freq: Four times a day (QID) | ORAL | Status: AC | PRN

## 2013-02-18 MED ORDER — CHLORDIAZEPOXIDE HCL 25 MG PO CAPS
25.0000 mg | ORAL_CAPSULE | Freq: Four times a day (QID) | ORAL | Status: AC | PRN
  Administered 2013-02-19: 25 mg via ORAL
  Filled 2013-02-18: qty 1

## 2013-02-18 MED ORDER — INFLUENZA VIRUS VACC SPLIT PF IM SUSP
0.5000 mL | INTRAMUSCULAR | Status: AC
  Administered 2013-02-19: 0.5 mL via INTRAMUSCULAR

## 2013-02-18 MED ORDER — ADULT MULTIVITAMIN W/MINERALS CH
1.0000 | ORAL_TABLET | Freq: Every day | ORAL | Status: DC
  Administered 2013-02-18 – 2013-02-23 (×6): 1 via ORAL
  Filled 2013-02-18 (×7): qty 1

## 2013-02-18 MED ORDER — HYDROXYZINE HCL 25 MG PO TABS
25.0000 mg | ORAL_TABLET | Freq: Four times a day (QID) | ORAL | Status: AC | PRN
  Administered 2013-02-18: 25 mg via ORAL

## 2013-02-18 MED ORDER — VITAMIN B-1 100 MG PO TABS
100.0000 mg | ORAL_TABLET | Freq: Every day | ORAL | Status: DC
  Administered 2013-02-18 – 2013-02-23 (×6): 100 mg via ORAL
  Filled 2013-02-18 (×7): qty 1

## 2013-02-18 MED ORDER — LOPERAMIDE HCL 2 MG PO CAPS: 2.0000 mg | ORAL_CAPSULE | ORAL | Status: AC | PRN

## 2013-02-18 MED ORDER — TRAZODONE HCL 50 MG PO TABS
50.0000 mg | ORAL_TABLET | Freq: Every day | ORAL | Status: DC
  Administered 2013-02-18 – 2013-02-22 (×5): 50 mg via ORAL
  Filled 2013-02-18 (×2): qty 1
  Filled 2013-02-18: qty 4
  Filled 2013-02-18 (×5): qty 1

## 2013-02-18 MED ORDER — FLUTICASONE PROPIONATE HFA 44 MCG/ACT IN AERO
1.0000 | INHALATION_SPRAY | Freq: Two times a day (BID) | RESPIRATORY_TRACT | Status: DC
  Administered 2013-02-18 – 2013-02-23 (×11): 1 via RESPIRATORY_TRACT
  Filled 2013-02-18: qty 10.6

## 2013-02-18 MED ORDER — CHLORDIAZEPOXIDE HCL 25 MG PO CAPS
25.0000 mg | ORAL_CAPSULE | Freq: Four times a day (QID) | ORAL | Status: AC
  Administered 2013-02-18 – 2013-02-19 (×6): 25 mg via ORAL
  Filled 2013-02-18 (×6): qty 1

## 2013-02-18 MED ORDER — SUMATRIPTAN SUCCINATE 25 MG PO TABS: 50.0000 mg | ORAL_TABLET | ORAL | Status: DC | PRN

## 2013-02-18 MED ORDER — IBUPROFEN 600 MG PO TABS: 600.0000 mg | ORAL_TABLET | Freq: Three times a day (TID) | ORAL | Status: DC | PRN

## 2013-02-18 MED ORDER — NICOTINE 21 MG/24HR TD PT24
21.0000 mg | MEDICATED_PATCH | Freq: Every day | TRANSDERMAL | Status: DC
  Administered 2013-02-18 – 2013-02-23 (×5): 21 mg via TRANSDERMAL
  Filled 2013-02-18 (×8): qty 1

## 2013-02-18 MED ORDER — PNEUMOCOCCAL VAC POLYVALENT 25 MCG/0.5ML IJ INJ
0.5000 mL | INJECTION | INTRAMUSCULAR | Status: AC
  Administered 2013-02-19: 0.5 mL via INTRAMUSCULAR

## 2013-02-18 MED ORDER — CHLORDIAZEPOXIDE HCL 25 MG PO CAPS
25.0000 mg | ORAL_CAPSULE | Freq: Three times a day (TID) | ORAL | Status: AC
  Administered 2013-02-19 – 2013-02-20 (×3): 25 mg via ORAL
  Filled 2013-02-18 (×3): qty 1

## 2013-02-18 MED ORDER — CHLORDIAZEPOXIDE HCL 25 MG PO CAPS
25.0000 mg | ORAL_CAPSULE | ORAL | Status: AC
  Administered 2013-02-20 – 2013-02-21 (×2): 25 mg via ORAL
  Filled 2013-02-18 (×2): qty 1

## 2013-02-18 MED ORDER — DICYCLOMINE HCL 20 MG PO TABS
20.0000 mg | ORAL_TABLET | Freq: Three times a day (TID) | ORAL | Status: DC
  Administered 2013-02-18 – 2013-02-23 (×17): 20 mg via ORAL
  Filled 2013-02-18 (×19): qty 1

## 2013-02-18 MED ORDER — ACETAMINOPHEN 325 MG PO TABS: 650.0000 mg | ORAL_TABLET | Freq: Four times a day (QID) | ORAL | Status: DC | PRN

## 2013-02-18 MED ORDER — CHLORDIAZEPOXIDE HCL 25 MG PO CAPS
25.0000 mg | ORAL_CAPSULE | Freq: Every day | ORAL | Status: AC
  Administered 2013-02-22: 25 mg via ORAL
  Filled 2013-02-18: qty 1

## 2013-02-18 MED ORDER — MAGNESIUM HYDROXIDE 400 MG/5ML PO SUSP: 30.0000 mL | Freq: Every day | ORAL | Status: DC | PRN

## 2013-02-18 NOTE — Progress Notes (Signed)
Adult Psychoeducational Group Note  Date:  02/18/2013 Time:  2:27 PM  Group Topic/Focus:  Healthy Communication:   The focus of this group is to discuss communication, barriers to communication, as well as healthy ways to communicate with others.  Participation Level:  Active  Participation Quality:  Appropriate, Attentive, Sharing and Supportive  Affect:  Flat  Cognitive:  Alert  Insight: Good  Engagement in Group:  Engaged  Modes of Intervention:  Exploration and Support  Additional Comments:  Pt was attentive and participated in group, pt shared and was supportive to others.  Alfonse Spruce 02/18/2013, 2:27 PM

## 2013-02-18 NOTE — Progress Notes (Signed)
Adult Psychoeducational Group Note  Date:  02/18/2013 Time:  10:01 AM  Group Topic/Focus:  Self Inventory Review  Participation Level:  Active  Participation Quality:  Appropriate, Attentive and Sharing  Affect:  Anxious and Tearful  Cognitive:  Alert and Appropriate  Insight: Good  Engagement in Group:  Developing/Improving, Engaged and Supportive  Modes of Intervention:  Clarification, Discussion, Problem-solving and Support  Additional Comments:  Pt was attentive and actively participated in the group.  Showed support to other patients.  Rates anxiety level at a 9 out of 10 and became tearful because she states that the last time she was here years ago she had a bad experience but since [facility] is under different management now she was willing to give it a try.  Pt shared her reason for being admitted and admits that her drinking was becoming a problem.  Alfonse Spruce 02/18/2013, 10:01 AM

## 2013-02-18 NOTE — Clinical Social Work Note (Signed)
[facility] Group Notes:  (Clinical Social Work)  02/18/2013     10-11AM  Summary of Progress/Problems:   The main focus of today's process group was for the patient to identify ways in which they have in the past sabotaged their own recovery. Motivational Interviewing was utilized to ask the group members what they get out of their substance use, and what they want to change.  The Stages of Change were explained, and members identified where they currently are with regard to stages of change.  The patient expressed that she uses alcohol to relax and feel "normal" but she recognizes that there really is no such thing as normal, just a desire to feel the best that she herself can feel.  Her motivation to change is 9 out of 10, and she feels she will be able to be fully committed to change when her medications are right, she can have a normal sleep pattern, and stop having panic attacks.  Type of Therapy:  Group Therapy - Process   Participation Level:  Active  Participation Quality:  Attentive, Sharing and Supportive  Affect:  Blunted  Cognitive:  Appropriate and Oriented  Insight:  Engaged  Engagement in Therapy:  Engaged  Modes of Intervention:  Education, Teacher, English as a foreign language, Exploration, Discussion, Motivational Interviewing   Ambrose Mantle, LCSW 02/18/2013, 12:16 PM

## 2013-02-18 NOTE — Tx Team (Signed)
Initial Interdisciplinary Treatment Plan  PATIENT STRENGTHS: (choose at least two) Ability for insight Average or above average intelligence Capable of independent living General fund of knowledge Motivation for treatment/growth Supportive family/friends  PATIENT STRESSORS: Marital or family conflict Occupational concerns   PROBLEM LIST: Problem List/Patient Goals Date to be addressed Date deferred Reason deferred Estimated date of resolution  Depression 02/17/13     Anxiety 02/17/13     ETOH abuse 02/17/13     Suicidality 02/17/13                                    DISCHARGE CRITERIA:  Ability to meet basic life and health needs Improved stabilization in mood, thinking, and/or behavior Motivation to continue treatment in a less acute level of care Verbal commitment to aftercare and medication compliance Withdrawal symptoms are absent or subacute and managed without 24-hour nursing intervention  PRELIMINARY DISCHARGE PLAN: Attend aftercare/continuing care group Participate in family therapy Return to previous living arrangement  PATIENT/FAMIILY INVOLVEMENT: This treatment plan has been presented to and reviewed with the patient, Kelly Zuniga, and/or family member, .  The patient and family have been given the opportunity to ask questions and make suggestions.  Kelly Zuniga, [place] 02/18/2013, 12:15 AM

## 2013-02-18 NOTE — [facility] Suicide Risk Assessment (Signed)
Suicide Risk Assessment  Admission Assessment     Nursing information obtained from:  Patient Demographic factors:  Caucasian;Unemployed;Access to firearms Current Mental Status:  NA Loss Factors:  Decrease in vocational status Historical Factors:  Family history of mental illness or substance abuse Risk Reduction Factors:  Sense of responsibility to family;Living with another person, especially a relative;Positive social support  CLINICAL FACTORS:   Panic Attacks Depression:   Hopelessness Impulsivity Insomnia Severe Alcohol/Substance Abuse/Dependencies  COGNITIVE FEATURES THAT CONTRIBUTE TO RISK:  Closed-mindedness Loss of executive function Polarized thinking    SUICIDE RISK:   Moderate:  Frequent suicidal ideation with limited intensity, and duration, some specificity in terms of plans, no associated intent, good self-control, limited dysphoria/symptomatology, some risk factors present, and identifiable protective factors, including available and accessible social support.  PLAN OF CARE:  I certify that inpatient services furnished can reasonably be expected to improve the patient's condition.  Kelly Zuniga T. 02/18/2013, 1:28 PM

## 2013-02-18 NOTE — Progress Notes (Signed)
D.  Pt pleasant on approach, denies complaints at this time.  Interacting appropriately within milieu.  Positive for evening group tonight.  Denies SI/HI/hallucinations at this time.  A.  Support and encouragement offered  R.  Pt remains safe on the unit, will continue to monitor.

## 2013-02-18 NOTE — Progress Notes (Signed)
46 year old female admitted on involuntary basis. Pt, on admission, said she was having a panic attack and during the panic attack became suicidal and made a suicidal statement about shooting herself in the front yard. Pt reports that she only has these feelings when she has a panic attack. Pt reports an increase in these recently and spoke about not having them for many years before recently. Pt does report some stressors in her marriage and also occupational concerns. 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 Pt also reports that her father is in poor health. Pt does have a psychiatrist and a PCP she sees on a regular basis. Pt also reports an increase in her alcohol consumption recently. Pt is able to contract for safety on the unit. Pt also reported being here in 1988 when it was [facility] and has not had any hospitalizations since then. Pt was oriented to the unit and safety maintained.

## 2013-02-18 NOTE — H&P (Addendum)
Psychiatric Admission Assessment Adult  Patient Identification:  Kelly Zuniga Date of Evaluation:  02/18/2013 Chief Complaint:  MDD disorder, single, without psychotic features History of Present Illness:Patient states that she had a panic attack and was referred to inpatient after going to [facility] ED. Elements:  Location:  [facility] Adult unit. Quality:  Affects patients ability to function . Severity:  Patient wanting to kill self by overdosing. Timing:  Intermittent. Context:  At home and in community . Associated Signs/Synptoms: Depression Symptoms:  depressed mood, fatigue, hopelessness, impaired memory, suicidal thoughts with specific plan, anxiety, (Hypo) Manic Symptoms:  Distractibility, Anxiety Symptoms:  Panic Symptoms, Psychotic Symptoms:  None noted PTSD Symptoms: None noted  Psychiatric Specialty Exam: Physical Exam  Constitutional: She appears well-developed and well-nourished.  Psychiatric: Her speech is normal and behavior is normal. Judgment and thought content normal. Her mood appears anxious. Cognition and memory are normal. She exhibits a depressed mood.  Patient states that she has problem with concentration related to getting distracted easily.    Review of Systems  Constitutional: Negative.   HENT: Negative.   Eyes: Negative.   Respiratory: Negative.   Cardiovascular: Negative for chest pain, palpitations, orthopnea, claudication, leg swelling and PND.       Patient stated that she thought she was having an heart attack and was told that it was a panic attack after all other conditions were cleared.  Gastrointestinal: Positive for diarrhea. Negative for nausea, vomiting and blood in stool.  Genitourinary: Negative.   Musculoskeletal: Negative.   Skin: Negative.   Neurological: Negative.   Endo/Heme/Allergies: Negative.   Psychiatric/Behavioral: Positive for depression (rates 9/10) and suicidal ideas (Patient states that when  panic attack wants to die.  States that she has gun at home that she would use). Negative for hallucinations. The patient is nervous/anxious (Rates 7/10).   Physical Examination done in [facility] ED by Dr. Freada Bergeron and I concur with findings.     Blood pressure 104/83, pulse 123, temperature 96.8 F (36 C), temperature source Oral, resp. rate 16, height 5' (1.524 m), weight 45.36 kg (100 lb), last menstrual period 02/16/2013.Body mass index is 19.53 kg/(m^2).  General Appearance: Casual and Disheveled  Eye Contact::  Minimal  Speech:  Clear and Coherent and Normal Rate  Volume:  Normal  Mood:  Anxious, Depressed, Hopeless and Worthless  Affect:  Depressed  Thought Process:  Circumstantial and Goal Directed  Orientation:  Full (Time, Place, and Person)  Thought Content:  WDL  Suicidal Thoughts:  Yes.  with intent/plan  Homicidal Thoughts:  No  Memory:  Immediate;   Fair Recent;   Fair Remote;   Fair  Judgement:  Fair  Insight:  Fair  Psychomotor Activity:  NA  Concentration:  Fair  Recall:  Fair  Akathisia:  No  Handed:  Right  AIMS (if indicated):     Assets:  Communication Skills Desire for Improvement Housing Physical Health Social Support Transportation  Sleep:  Number of Hours: 5.25    Past Psychiatric History: Diagnosis:  Hospitalizations:  Outpatient Care:  Substance Abuse Care:  Self-Mutilation:  Suicidal Attempts:  Violent Behaviors:   Past Medical History:   Past Medical History  Diagnosis Date  . Allergy   . Anxiety   . Asthma   . Depression     posibly bipolar disorder. Sees Dr [name] for psychiatric care and Dr [name] for therapy  . Headache     last migraine 2004  . Hypertension  never on meds    None. Allergies:   Allergies  Allergen Reactions  . Acetaminophen     Makes LFT gets elevated  . Lithium     REACTION: no appetite   PTA Medications: Prescriptions prior to admission  Medication Sig Dispense Refill  . albuterol  (PROVENTIL HFA;VENTOLIN HFA) 108 (90 BASE) MCG/ACT inhaler Inhale 2 puffs into the lungs every 4 (four) hours as needed. For shortness of breath  3 Inhaler  1  . beclomethasone (QVAR) 40 MCG/ACT inhaler Inhale 2 puffs into the lungs at bedtime.  3 Inhaler  1  . clonazePAM (KLONOPIN) 0.5 MG tablet Take 0.5 mg by mouth 2 (two) times daily as needed for anxiety.      . dicyclomine (BENTYL) 20 MG tablet Take 1 tablet (20 mg total) by mouth 3 (three) times daily.  90 tablet  11  . montelukast (SINGULAIR) 10 MG tablet Take 1 tablet (10 mg total) by mouth at bedtime.  90 tablet  1  . Multiple Vitamin (MULTIVITAMIN WITH MINERALS) TABS Take 1 tablet by mouth daily.      . sertraline (ZOLOFT) 50 MG tablet Take 50 mg by mouth daily.      . SUMAtriptan (IMITREX) 50 MG tablet Take 50 mg by mouth daily as needed. For migraines       . triamcinolone (NASACORT) 55 MCG/ACT nasal inhaler Place 2 sprays into the nose daily.  1 Inhaler  11    Previous Psychotropic Medications:  Medication/Dose                 Substance Abuse History in the last 12 months:  yes  Consequences of Substance Abuse: Medical Consequences:  addition Legal Consequences:  jail, arrest Family Consequences:  family dscord  Social History:  reports that she has been smoking Cigarettes.  She has been smoking about 0.50 packs per day. She has never used smokeless tobacco. She reports that  drinks alcohol. She reports that she does not use illicit drugs. Additional Social History:                      Current Place of Residence:   Place of Birth:   Family Members: Marital Status:  Married Children:  None  Sons:   Daughters: Relationships: Education:  Print production planner Problems/Performance: Religious Beliefs/Practices: History of Abuse (Emotional/Phsycial/Sexual) Teacher, music History:  None. Legal History: Hobbies/Interests:  Family History:   Family History  Problem Relation Age of  Onset  . Coronary artery disease      1st degree relative female <50  . Depression      Family Hx  . Diabetes      Family Hx 1st degree relative  . Ovarian cancer      Family Hx     Results for orders placed during the hospital encounter of 02/16/13 (from the past 72 hour(s))  CBC WITH DIFFERENTIAL     Status: Abnormal   Collection Time    02/16/13  3:07 PM      Result Value Range   WBC 7.0  4.0 - 10.5 K/uL   RBC 3.51 (*) 3.87 - 5.11 MIL/uL   Hemoglobin 13.3  12.0 - 15.0 g/dL   HCT 16.1  09.6 - 04.5 %   MCV 110.5 (*) 78.0 - 100.0 fL   MCH 37.9 (*) 26.0 - 34.0 pg   MCHC 34.3  30.0 - 36.0 g/dL   RDW 40.9  81.1 - 91.4 %   Platelets 234  150 - 400 K/uL   Neutrophils Relative 63  43 - 77 %   Neutro Abs 4.4  1.7 - 7.7 K/uL   Lymphocytes Relative 22  12 - 46 %   Lymphs Abs 1.5  0.7 - 4.0 K/uL   Monocytes Relative 10  3 - 12 %   Monocytes Absolute 0.7  0.1 - 1.0 K/uL   Eosinophils Relative 4  0 - 5 %   Eosinophils Absolute 0.3  0.0 - 0.7 K/uL   Basophils Relative 1  0 - 1 %   Basophils Absolute 0.1  0.0 - 0.1 K/uL   Smear Review MORPHOLOGY UNREMARKABLE    COMPREHENSIVE METABOLIC PANEL     Status: Abnormal   Collection Time    02/16/13  3:07 PM      Result Value Range   Sodium 131 (*) 135 - 145 mEq/L   Potassium 3.9  3.5 - 5.1 mEq/L   Chloride 93 (*) 96 - 112 mEq/L   CO2 17 (*) 19 - 32 mEq/L   Glucose, Bld 62 (*) 70 - 99 mg/dL   BUN 7  6 - 23 mg/dL   Creatinine, Ser 4.09  0.50 - 1.10 mg/dL   Calcium 8.8  8.4 - 81.1 mg/dL   Total Protein 7.2  6.0 - 8.3 g/dL   Albumin 4.1  3.5 - 5.2 g/dL   AST 914 (*) 0 - 37 U/L   ALT 150 (*) 0 - 35 U/L   Alkaline Phosphatase 172 (*) 39 - 117 U/L   Total Bilirubin 0.5  0.3 - 1.2 mg/dL   GFR calc non Af Amer >90  >90 mL/min   GFR calc Af Amer >90  >90 mL/min   Comment:            The eGFR has been calculated     using the CKD EPI equation.     This calculation has not been     validated in all clinical     situations.     eGFR's  persistently     <90 mL/min signify     possible Chronic Kidney Disease.  ETHANOL     Status: Abnormal   Collection Time    02/16/13  3:07 PM      Result Value Range   Alcohol, Ethyl (B) 259 (*) 0 - 11 mg/dL   Comment:            LOWEST DETECTABLE LIMIT FOR     SERUM ALCOHOL IS 11 mg/dL     FOR MEDICAL PURPOSES ONLY  ACETAMINOPHEN LEVEL     Status: None   Collection Time    02/16/13  3:07 PM      Result Value Range   Acetaminophen (Tylenol), Serum <15.0  10 - 30 ug/mL   Comment:            THERAPEUTIC CONCENTRATIONS VARY     SIGNIFICANTLY. A RANGE OF 10-30     ug/mL MAY BE AN EFFECTIVE     CONCENTRATION FOR MANY PATIENTS.     HOWEVER, SOME ARE BEST TREATED     AT CONCENTRATIONS OUTSIDE THIS     RANGE.     ACETAMINOPHEN CONCENTRATIONS     >150 ug/mL AT 4 HOURS AFTER     INGESTION AND >50 ug/mL AT 12     HOURS AFTER INGESTION ARE     OFTEN ASSOCIATED WITH TOXIC     REACTIONS.  SALICYLATE LEVEL     Status: Abnormal  Collection Time    02/16/13  3:07 PM      Result Value Range   Salicylate Lvl <2.0 (*) 2.8 - 20.0 mg/dL  URINE RAPID DRUG SCREEN (HOSP PERFORMED)     Status: None   Collection Time    02/16/13  3:17 PM      Result Value Range   Opiates NONE DETECTED  NONE DETECTED   Cocaine NONE DETECTED  NONE DETECTED   Benzodiazepines NONE DETECTED  NONE DETECTED   Amphetamines NONE DETECTED  NONE DETECTED   Tetrahydrocannabinol NONE DETECTED  NONE DETECTED   Barbiturates NONE DETECTED  NONE DETECTED   Comment:            DRUG SCREEN FOR MEDICAL PURPOSES     ONLY.  IF CONFIRMATION IS NEEDED     FOR ANY PURPOSE, NOTIFY LAB     WITHIN 5 DAYS.                LOWEST DETECTABLE LIMITS     FOR URINE DRUG SCREEN     Drug Class       Cutoff (ng/mL)     Amphetamine      1000     Barbiturate      200     Benzodiazepine   200     Tricyclics       300     Opiates          300     Cocaine          300     THC              50  GLUCOSE, CAPILLARY     Status: Abnormal    Collection Time    02/16/13  4:59 PM      Result Value Range   Glucose-Capillary 53 (*) 70 - 99 mg/dL  GLUCOSE, CAPILLARY     Status: None   Collection Time    02/16/13  5:29 PM      Result Value Range   Glucose-Capillary 84  70 - 99 mg/dL   Psychological Evaluations:  Assessment:   AXIS I:  Alcohol dependence ANXIETY Substance abuse  AXIS II:  Deferred AXIS III:   Past Medical History  Diagnosis Date  . Allergy   . Anxiety   . Asthma   . Depression     posibly bipolar disorder. Sees Dr [name] for psychiatric care and Dr [name] for therapy  . Headache     last migraine 2004  . Hypertension     never on meds    AXIS IV:  other psychosocial or environmental problems AXIS V:  11-20 some danger of hurting self or others possible OR occasionally fails to maintain minimal personal hygiene OR gross impairment in communication  Treatment Plan/Recommendations:   1. Admit for crisis management and stabilization. Estimated length of stay is 3 to 5 days. 2. Medication management to reduce current symptoms to base line and improve the   patient's overall level of functioning.  3. Treat health problems as indicated: Neosporin ointment, apply to wound spot to back of left leg bid. 4. Develop treatment plan to decrease risk of relapse upon discharge and the need for readmission.  5. Psycho-social education regarding relapse prevention and self-care.  6. Health care follow up as needed for medical problems.  7. Review and reinstate any pertinent home medications for other health issues where appropriate.  8.  Call for Consult with Hospitalist for  additional specialty patient services as needed  Treatment Plan Summary: Daily contact with patient to assess and evaluate symptoms and progress in treatment Medication management Current Medications:  Current Facility-Administered Medications  Medication Dose Route Frequency Provider Last Rate Last Dose  . acetaminophen  (TYLENOL) tablet 650 mg  650 mg Oral Q6H PRN Verne Spurr, PA-C      . albuterol (PROVENTIL HFA;VENTOLIN HFA) 108 (90 BASE) MCG/ACT inhaler 2 puff  2 puff Inhalation Q4H PRN Verne Spurr, PA-C      . alum & mag hydroxide-simeth (MAALOX/MYLANTA) 200-200-20 MG/5ML suspension 30 mL  30 mL Oral PRN Verne Spurr, PA-C      . chlordiazePOXIDE (LIBRIUM) capsule 25 mg  25 mg Oral QID Verne Spurr, PA-C   25 mg at 02/18/13 0756   Followed by  . [START ON 02/19/2013] chlordiazePOXIDE (LIBRIUM) capsule 25 mg  25 mg Oral TID Verne Spurr, PA-C       Followed by  . [START ON 02/20/2013] chlordiazePOXIDE (LIBRIUM) capsule 25 mg  25 mg Oral BH-qamhs Verne Spurr, PA-C       Followed by  . [START ON 02/22/2013] chlordiazePOXIDE (LIBRIUM) capsule 25 mg  25 mg Oral Daily Verne Spurr, PA-C      . chlordiazePOXIDE (LIBRIUM) capsule 25 mg  25 mg Oral Q6H PRN Verne Spurr, PA-C      . dicyclomine (BENTYL) tablet 20 mg  20 mg Oral TID Verne Spurr, PA-C   20 mg at 02/18/13 0756  . fluticasone (FLONASE) 50 MCG/ACT nasal spray 1 spray  1 spray Each Nare Daily Verne Spurr, PA-C   1 spray at 02/18/13 0757  . fluticasone (FLOVENT HFA) 44 MCG/ACT inhaler 1 puff  1 puff Inhalation BID Verne Spurr, PA-C   1 puff at 02/18/13 0757  . hydrOXYzine (ATARAX/VISTARIL) tablet 25 mg  25 mg Oral Q6H PRN Verne Spurr, PA-C   25 mg at 02/18/13 0758  . ibuprofen (ADVIL,MOTRIN) tablet 600 mg  600 mg Oral Q8H PRN Verne Spurr, PA-C      . [START ON 02/19/2013] influenza  inactive virus vaccine (FLUZONE/FLUARIX) injection 0.5 mL  0.5 mL Intramuscular Tomorrow-1000 Rachael Fee, MD      . loperamide (IMODIUM) capsule 2-4 mg  2-4 mg Oral PRN Verne Spurr, PA-C      . magnesium hydroxide (MILK OF MAGNESIA) suspension 30 mL  30 mL Oral Daily PRN Verne Spurr, PA-C      . montelukast (SINGULAIR) tablet 10 mg  10 mg Oral QHS Verne Spurr, PA-C      . multivitamin with minerals tablet 1 tablet  1 tablet Oral Daily Verne Spurr, PA-C   1  tablet at 02/18/13 0756  . nicotine (NICODERM CQ - dosed in mg/24 hours) patch 21 mg  21 mg Transdermal Daily Verne Spurr, PA-C   21 mg at 02/18/13 0757  . ondansetron (ZOFRAN-ODT) disintegrating tablet 4 mg  4 mg Oral Q6H PRN Verne Spurr, PA-C      . [START ON 02/19/2013] pneumococcal 23 valent vaccine (PNU-IMMUNE) injection 0.5 mL  0.5 mL Intramuscular Tomorrow-1000 Rachael Fee, MD      . SUMAtriptan [facility]) tablet 50 mg  50 mg Oral Q2H PRN Verne Spurr, PA-C      . thiamine (VITAMIN B-1) tablet 100 mg  100 mg Oral Daily Verne Spurr, PA-C   100 mg at 02/18/13 0756  . traZODone (DESYREL) tablet 50 mg  50 mg Oral QHS Verne Spurr, PA-C      . Melene Muller  ON 02/19/2013] venlafaxine XR (EFFEXOR-XR) 24 hr capsule 75 mg  75 mg Oral QAC breakfast Verne Spurr, PA-C        Observation Level/Precautions:  15 minute checks  Laboratory:  CBC Chemistry Profile Folic Acid HCG UDS UA  Psychotherapy:  Group sessions  Medications:  Monitor and change or add if needed  Consultations:  None at this time  Discharge Concerns:  Relapse or self harm  Estimated LOS:  7 days  Other:     I certify that inpatient services furnished can reasonably be expected to improve the patient's condition.   Rankin, Shuvon 3/1/201410:52 AM  Mental Status examination. Patient is a middle-aged female who is casually dressed and fairly groomed.  She appears very anxious and depressed.  She maintained fair eye contact.  Her speech is clear and coherent.  She is tearful but cooperative.  Her thoughts are slow but logical linear and goal-directed.  She described her mood is depressed and sad and her affect is constricted.  She endorse suicidal thinking but no plan.  She denies any auditory or visual hallucination.  She denies any homicidal thoughts.  She has mild tremors.  There were no flight of ideas or any loose association.  Her attention and concentration is fair.  There were no delusion or paranoid thinking.  She's  alert and oriented x3.  Her insight judgment and impulse control is limited.  Assessment Alcohol dependence, Maj. depressive disorder  Treatment plan Patient seen.  Chart reviewed including review of systems, diagnosis and circumstances that led hospitalization.  Patient does require inpatient treatment.  We will discontinue Zoloft and start Effexor to help her anxiety.  We will put her on Librium protocol.  Encouraged to participate in group therapy.  Please see complete the plan.  Kathryne Sharper, MD

## 2013-02-18 NOTE — Progress Notes (Signed)
D:  Per pt self inventory pt reports sleeping well, appetite improving, energy level low, ability to pay attention improving, rates depression at a 10 out of 10 and hopelessness at a 9 out of 10, admits to SI on Thursday but denies now, anxiety at a 9 out of 10, reports tremors and occasional dizziness.     A:  Emotional support provided, Encouraged pt to continue with treatment plan and attend all group activities, q15 min checks maintained for safety, orthostatic BP's obtained q6hours.  R:  Pt is receptive to treatment, pleasant and cooperative with staff and peers, pt actively participated in group.

## 2013-02-19 ENCOUNTER — Encounter (HOSPITAL_COMMUNITY): Payer: Self-pay | Admitting: Registered Nurse

## 2013-02-19 DIAGNOSIS — F1010 Alcohol abuse, uncomplicated: Secondary | ICD-10-CM

## 2013-02-19 NOTE — Progress Notes (Signed)
[facility] MD Progress Note  02/19/2013 7:34 AM Kelly Zuniga  MRN:  [id number] Subjective:  Patient states that she was somewhat anxious when she first got to [facility] related to a past experience she had when admitted to [facility] years ago; but has become more comfortable and able to participate in group sessions.  Diagnosis:  Axis I: Anxiety Disorder NOS and Alcohol abuse  ADL's:  Intact  Sleep: Good  Appetite:  Good  Suicidal Ideation:  Plan:  Shoot self Intent:  yes Means:  gun at home Homicidal Ideation:  No AEB (as evidenced by):  Psychiatric Specialty Exam: Review of Systems  Constitutional: Negative.   HENT: Negative.   Skin: Negative.   Neurological: Negative.        Patient states that she still has the shakes but are improving   Endo/Heme/Allergies: Negative.   Psychiatric/Behavioral: Positive for substance abuse (Alcohol). Depression: Rates 5/10. Suicidal ideas: Last SI thoughts was on Thursday when admitted to the hospital. The patient does not have insomnia. Nervous/anxious: Rates 3/10.     Blood pressure 92/61, pulse 103, temperature 96.8 F (36 C), temperature source Oral, resp. rate 16, height 5' (1.524 m), weight 45.36 kg (100 lb), last menstrual period 02/16/2013.Body mass index is 19.53 kg/(m^2).  General Appearance: Disheveled  Eye Contact::  Good  Speech:  Clear and Coherent and Normal Rate  Volume:  Normal  Mood:  Anxious  Affect:  Appropriate  Thought Process:  Circumstantial and Goal Directed  Orientation:  Full (Time, Place, and Person)  Thought Content:  WDL  Suicidal Thoughts:  Yes.  with intent/plan  Homicidal Thoughts:  No  Memory:  Immediate;   Fair Recent;   Fair Remote;   Fair  Judgement:  Fair  Insight:  Good  Psychomotor Activity:  Normal  Concentration:  Fair  Recall:  Fair  Akathisia:  No  Handed:  Right  AIMS (if indicated):     Assets:  Communication Skills Desire for Improvement Housing Social Support  Sleep:  Number of Hours:  6   Current Medications: Current Facility-Administered Medications  Medication Dose Route Frequency Provider Last Rate Last Dose  . acetaminophen (TYLENOL) tablet 650 mg  650 mg Oral Q6H PRN Verne Spurr, PA-C      . albuterol (PROVENTIL HFA;VENTOLIN HFA) 108 (90 BASE) MCG/ACT inhaler 2 puff  2 puff Inhalation Q4H PRN Verne Spurr, PA-C      . alum & mag hydroxide-simeth (MAALOX/MYLANTA) 200-200-20 MG/5ML suspension 30 mL  30 mL Oral PRN Verne Spurr, PA-C      . chlordiazePOXIDE (LIBRIUM) capsule 25 mg  25 mg Oral QID Verne Spurr, PA-C   25 mg at 02/18/13 2142   Followed by  . chlordiazePOXIDE (LIBRIUM) capsule 25 mg  25 mg Oral TID Verne Spurr, PA-C       Followed by  . [START ON 02/20/2013] chlordiazePOXIDE (LIBRIUM) capsule 25 mg  25 mg Oral BH-qamhs Verne Spurr, PA-C       Followed by  . [START ON 02/22/2013] chlordiazePOXIDE (LIBRIUM) capsule 25 mg  25 mg Oral Daily Verne Spurr, PA-C      . chlordiazePOXIDE (LIBRIUM) capsule 25 mg  25 mg Oral Q6H PRN Verne Spurr, PA-C      . dicyclomine (BENTYL) tablet 20 mg  20 mg Oral TID Verne Spurr, PA-C   20 mg at 02/18/13 1715  . fluticasone (FLONASE) 50 MCG/ACT nasal spray 1 spray  1 spray Each Nare Daily Verne Spurr, PA-C   1 spray at  02/18/13 0757  . fluticasone (FLOVENT HFA) 44 MCG/ACT inhaler 1 puff  1 puff Inhalation BID Verne Spurr, PA-C   1 puff at 02/18/13 2000  . hydrOXYzine (ATARAX/VISTARIL) tablet 25 mg  25 mg Oral Q6H PRN Verne Spurr, PA-C   25 mg at 02/18/13 0758  . ibuprofen (ADVIL,MOTRIN) tablet 600 mg  600 mg Oral Q8H PRN Verne Spurr, PA-C      . influenza  inactive virus vaccine (FLUZONE/FLUARIX) injection 0.5 mL  0.5 mL Intramuscular Tomorrow-1000 Rachael Fee, MD      . loperamide (IMODIUM) capsule 2-4 mg  2-4 mg Oral PRN Verne Spurr, PA-C      . loperamide (IMODIUM) capsule 4 mg  4 mg Oral PRN Shuvon Rankin, NP   4 mg at 02/18/13 1147  . magnesium hydroxide (MILK OF MAGNESIA) suspension 30 mL  30 mL Oral  Daily PRN Verne Spurr, PA-C      . montelukast (SINGULAIR) tablet 10 mg  10 mg Oral QHS Verne Spurr, PA-C   10 mg at 02/18/13 2142  . multivitamin with minerals tablet 1 tablet  1 tablet Oral Daily Verne Spurr, PA-C   1 tablet at 02/18/13 0756  . nicotine (NICODERM CQ - dosed in mg/24 hours) patch 21 mg  21 mg Transdermal Daily Verne Spurr, PA-C   21 mg at 02/18/13 0757  . ondansetron (ZOFRAN-ODT) disintegrating tablet 4 mg  4 mg Oral Q6H PRN Verne Spurr, PA-C      . pneumococcal 23 valent vaccine (PNU-IMMUNE) injection 0.5 mL  0.5 mL Intramuscular Tomorrow-1000 Rachael Fee, MD      . SUMAtriptan [facility]) tablet 50 mg  50 mg Oral Q2H PRN Verne Spurr, PA-C      . thiamine (VITAMIN B-1) tablet 100 mg  100 mg Oral Daily Verne Spurr, PA-C   100 mg at 02/18/13 0756  . traZODone (DESYREL) tablet 50 mg  50 mg Oral QHS Verne Spurr, PA-C   50 mg at 02/18/13 2142  . venlafaxine XR (EFFEXOR-XR) 24 hr capsule 75 mg  75 mg Oral QAC breakfast Verne Spurr, PA-C   75 mg at 02/19/13 1610    Lab Results: No results found for this or any previous visit (from the past 48 hour(s)).  Physical Findings: AIMS: Facial and Oral Movements Muscles of Facial Expression: None, normal Lips and Perioral Area: None, normal Jaw: None, normal Tongue: None, normal,Extremity Movements Upper (arms, wrists, hands, fingers): None, normal Lower (legs, knees, ankles, toes): None, normal, Trunk Movements Neck, shoulders, hips: None, normal, Overall Severity Severity of abnormal movements (highest score from questions above): None, normal Incapacitation due to abnormal movements: None, normal Patient's awareness of abnormal movements (rate only patient's report): No Awareness, Dental Status Current problems with teeth and/or dentures?: No Does patient usually wear dentures?: No  CIWA:  CIWA-Ar Total: 0 COWS:     Treatment Plan Summary: Daily contact with patient to assess and evaluate symptoms and progress  in treatment Medication management  Plan:  Will continue current plan and treatment  Medical Decision Making Problem Points:  Established problem, stable/improving (1), Review of last therapy session (1), Review of psycho-social stressors (1) and Self-limited or minor (1) Data Points:  Review or order clinical lab tests (1) Review of medication regiment & side effects (2)  I certify that inpatient services furnished can reasonably be expected to improve the patient's condition.   Rankin, Shuvon 02/19/2013, 7:34 AM

## 2013-02-19 NOTE — Progress Notes (Signed)
D-Patient visible on the unit and attending all scheduled groups. Denies any SI. Rates depression at 5 and feeling hopeless at 2. She reports feeling much better today. Yesterday her depression level was rated at a 9.  A-Patient expressing gratitude that she is making great progress in her treatment. Her goal after d/c is to "Not drink alcohol, eat more, exercise, be more socially interactive, and find a job." The patient reports that her support system is very strong.  R-Patient given support and encouragement to keep making the most of her treatment. Noted to be participating very well in group. Remains safe on the unit.

## 2013-02-19 NOTE — Clinical Social Work Note (Signed)
[facility] Group Notes:  (Clinical Social Work)  02/19/2013  10:00-11:00AM  Summary of Progress/Problems:   The main focus of today's process group was to define "support" and describe what healthy supports are, then to identify the patient's current support system and decide on other supports that can be put in place to prevent future hospitalizations.   Discussion ensued in which patients determined that drastic changes are necessary to obtain the drastic changes they want in their lives.  Scaling questions were asked to determine patients' level of motivation to seek support and confidence in doing so (1 lowest - 10 highest). The patient expressed that she cannot tell people "no" so she ends up overcommitted.  When this happens, she becomes stressed and starts to drinking.  The group pointed out that her inability to say no is causes her to drink.  She has let her house go in the time she has been drinking and unable to turn people down, so she has taken the drastic step of asking her mother to come in and help her to reorganize/clean her house and her life.  She is motivated at 51 to do this.  Type of Therapy:  Process Group with Motivational Interviewing  Participation Level:  Active  Participation Quality:  Appropriate, Attentive, Sharing and Supportive  Affect:  Appropriate  Cognitive:  Alert, Appropriate and Oriented  Insight:  Engaged  Engagement in Therapy:  Engaged  Modes of Intervention:  Clarification, Education, Limit-setting, Problem-solving, Socialization, Support and Processing, Exploration, Discussion, Role-Play   Ambrose Mantle, LCSW 02/19/2013, 11:05 AM

## 2013-02-19 NOTE — Progress Notes (Signed)
Adult Psychoeducational Group Note  Date:  02/19/2013 Time:  6:22 PM  Group Topic/Focus:  Different Perspectives  Participation Level:  Active  Participation Quality:  Appropriate and Attentive  Affect:  Appropriate  Cognitive:  Alert and Appropriate  Insight: Appropriate and Good  Engagement in Group:  Engaged  Modes of Intervention:  Activity and Education  Additional Comments:  Pt was engaged in discussion about different perspectives; engaged in activity where pts viewed different illusions and guessed what those images were.   Dalia Heading 02/19/2013, 6:22 PM

## 2013-02-19 NOTE — Progress Notes (Signed)
Adult Psychoeducational Group Note  Date:  02/19/2013 Time:  1315  Group Topic/Focus:  Making Healthy Choices:   The focus of this group is to help patients identify negative/unhealthy choices they were using prior to admission and identify positive/healthier coping strategies to replace them upon discharge. Relapse Prevention Planning:   The focus of this group is to define relapse and discuss the need for planning to combat relapse.  Participation Level:  Active  Participation Quality:  Appropriate  Affect:  Appropriate  Cognitive:  Alert  Insight: Good  Engagement in Group:  Engaged  Modes of Intervention:  Discussion  Additional Comments:  Patient very engaged in content of the group. She wants to start being productive in her life instead of abusing ETOH.   Fransisca Kaufmann ANN 02/19/2013, 2:22 PM

## 2013-02-19 NOTE — Progress Notes (Signed)
Adult Psychoeducational Group Note  Date:  02/19/2013 Time:  9:44 AM  Group Topic/Focus:  Self Inventory Review  Participation Level:  Active  Participation Quality:  Appropriate  Affect:  Appropriate  Cognitive:  Appropriate  Insight: Appropriate  Engagement in Group:  Engaged  Modes of Intervention:  Discussion  Additional Comments:  Kelly Zuniga was engaged in discussion and was encouraging to other participants.  Angela Adam 02/19/2013, 9:44 AM

## 2013-02-19 NOTE — Progress Notes (Signed)
D.  Pt bright and pleasant on approach.  Reports that she is still experiencing some cravings, does still have some tremors visible.  Positive for evening AA group, interacting appropriately within milieu.  Denies SI/HI/hallucinations at this time.  A.  Support and encouragement offered, medication given as ordered for cravings.  R.  Will continue to monitor.

## 2013-02-20 DIAGNOSIS — F329 Major depressive disorder, single episode, unspecified: Secondary | ICD-10-CM

## 2013-02-20 NOTE — Tx Team (Signed)
Interdisciplinary Treatment Plan Update (Adult)  Date: 02/20/2013  Time Reviewed: 9:47 AM   Progress in Treatment: Attending groups: Yes Participating in groups: Yes Taking medication as prescribed:  Yes Tolerating medication:  Yes Family/Significant othe contact made: No Patient understands diagnosis: Yes Discussing patient identified problems/goals with staff: Yes Medical problems stabilized or resolved:  Yes Denies suicidal/homicidal ideation: Yes Patient has not harmed self or Others: Yes  New problem(s) identified: None Identified  Discharge Plan or Barriers:  CSW will refer patient back to providers  Additional comments: N/A  Reason for Continuation of Hospitalization: Medical Issues, testing for diabetes Medication stabilization Withdrawal symptoms   Estimated length of stay: 1-2 days  For review of initial/current patient goals, please see plan of care.  Attendees: Patient:     Family:     Physician:  Geoffery Lyons 02/20/2013 9:47 AM   Nursing:   Roswell Miners, RN  02/20/2013 9:47 AM   Clinical Social Worker Ronda Fairly 02/20/2013 9:47 AM   Other:  Robbie Louis, RN 02/20/2013 9:47 AM   Other:  Armandina Stammer. NP 02/20/2013 9:47 AM   Other:   Norval Gable, NP Student  02/20/2013 9:47 AM   Other:   02/20/2013 9:47 AM    Scribe for Treatment Team:   Carney Bern, LCSWA  02/20/2013 9:47 AM

## 2013-02-20 NOTE — Progress Notes (Addendum)
Pt reported her sleep well appetite good energy low and ability to pay attention as good.  She rated her depression a 3 hopelessness a 2 and her anxiety a 2 on her self-inventory.  She denies any S/H ideation or A/V hallucinations.  She is either going home to her husband or live with her parents she is unsure at this time.  She c/o feeling dizzy so we talked about her safety and that she needs to change positions slowly.  She voiced understanding. She does have some labs to be drawn and no prn meds thus far.

## 2013-02-20 NOTE — Progress Notes (Signed)
Patient did attend the evening speaker AA meeting.  

## 2013-02-20 NOTE — Progress Notes (Signed)
[facility] MD Progress Note  02/20/2013 4:43 PM Kelly Zuniga  MRN:  [id number] Subjective:  Admits that she has not done well for the last several weeks. The Zoloft dose was changed twice and she has had increased, persistent panic attacks. She has contemplated killing herself twice at the pick of the anxiety attack. She still has some thoughts but seem to be getting better. Physically wise still has some dizziness, uneasy feeling, some tremors. She wants to get better. Concerned about her liver. She is willing to give different medications a try. Seemed that the Zoloft was not working well for her. She was placed on Effexor Diagnosis:  Alcohol Dependence/withdrawal, Panic Attack Disorder, Depressive Disorder NOS  ADL's:  Intact  Sleep: Poor  Appetite:  Poor  Suicidal Ideation:  Plan:  denies Intent:  denies Means:  denies Homicidal Ideation:  Plan:  denies Intent:  denies Means:  denies AEB (as evidenced by):  Psychiatric Specialty Exam: Review of Systems  Constitutional: Negative.   HENT: Negative.   Eyes: Negative.   Respiratory: Negative.   Cardiovascular: Negative.   Gastrointestinal: Negative.   Genitourinary: Negative.   Musculoskeletal: Negative.   Skin: Negative.   Neurological: Negative.   Endo/Heme/Allergies: Negative.   Psychiatric/Behavioral: Positive for depression and substance abuse. The patient is nervous/anxious and has insomnia.     Blood pressure 96/61, pulse 83, temperature 97.9 F (36.6 C), temperature source Oral, resp. rate 18, height 5' (1.524 m), weight 45.36 kg (100 lb), last menstrual period 02/16/2013.Body mass index is 19.53 kg/(m^2).  General Appearance: Fairly Groomed  Patent attorney::  Fair  Speech:  Clear and Coherent and rapid  Volume:  Normal  Mood:  Anxious and worried  Affect:  anxious  Thought Process:  Coherent and Goal Directed  Orientation:  Full (Time, Place, and Person)  Thought Content:  somatically focused, worries, concerns   Suicidal Thoughts:  Yes.  without intent/plan  Homicidal Thoughts:  No  Memory:  Immediate;   Fair Recent;   Fair Remote;   Fair  Judgement:  Fair  Insight:  Present  Psychomotor Activity:  Restlessness  Concentration:  Fair  Recall:  Fair  Akathisia:  No  Handed:  Right  AIMS (if indicated):     Assets:  Desire for Improvement  Sleep:  Number of Hours: 6.25   Current Medications: Current Facility-Administered Medications  Medication Dose Route Frequency Galilee Pierron Last Rate Last Dose  . acetaminophen (TYLENOL) tablet 650 mg  650 mg Oral Q6H PRN Verne Spurr, PA-C      . albuterol (PROVENTIL HFA;VENTOLIN HFA) 108 (90 BASE) MCG/ACT inhaler 2 puff  2 puff Inhalation Q4H PRN Verne Spurr, PA-C      . alum & mag hydroxide-simeth (MAALOX/MYLANTA) 200-200-20 MG/5ML suspension 30 mL  30 mL Oral PRN Verne Spurr, PA-C      . chlordiazePOXIDE (LIBRIUM) capsule 25 mg  25 mg Oral BH-qamhs Verne Spurr, PA-C       Followed by  . [START ON 02/22/2013] chlordiazePOXIDE (LIBRIUM) capsule 25 mg  25 mg Oral Daily Verne Spurr, PA-C      . chlordiazePOXIDE (LIBRIUM) capsule 25 mg  25 mg Oral Q6H PRN Verne Spurr, PA-C   25 mg at 02/19/13 2139  . dicyclomine (BENTYL) tablet 20 mg  20 mg Oral TID Verne Spurr, PA-C   20 mg at 02/20/13 1148  . fluticasone (FLONASE) 50 MCG/ACT nasal spray 1 spray  1 spray Each Nare Daily Verne Spurr, PA-C   1 spray at 02/20/13 0743  .  fluticasone (FLOVENT HFA) 44 MCG/ACT inhaler 1 puff  1 puff Inhalation BID Verne Spurr, PA-C   1 puff at 02/20/13 0743  . hydrOXYzine (ATARAX/VISTARIL) tablet 25 mg  25 mg Oral Q6H PRN Verne Spurr, PA-C   25 mg at 02/18/13 0758  . ibuprofen (ADVIL,MOTRIN) tablet 600 mg  600 mg Oral Q8H PRN Verne Spurr, PA-C      . loperamide (IMODIUM) capsule 2-4 mg  2-4 mg Oral PRN Verne Spurr, PA-C      . loperamide (IMODIUM) capsule 4 mg  4 mg Oral PRN Shuvon Rankin, NP   4 mg at 02/18/13 1147  . magnesium hydroxide (MILK OF MAGNESIA)  suspension 30 mL  30 mL Oral Daily PRN Verne Spurr, PA-C      . montelukast (SINGULAIR) tablet 10 mg  10 mg Oral QHS Verne Spurr, PA-C   10 mg at 02/19/13 2138  . multivitamin with minerals tablet 1 tablet  1 tablet Oral Daily Verne Spurr, PA-C   1 tablet at 02/20/13 0744  . nicotine (NICODERM CQ - dosed in mg/24 hours) patch 21 mg  21 mg Transdermal Daily Verne Spurr, PA-C   21 mg at 02/20/13 0744  . ondansetron (ZOFRAN-ODT) disintegrating tablet 4 mg  4 mg Oral Q6H PRN Verne Spurr, PA-C      . SUMAtriptan (IMITREX) tablet 50 mg  50 mg Oral Q2H PRN Verne Spurr, PA-C      . thiamine (VITAMIN B-1) tablet 100 mg  100 mg Oral Daily Verne Spurr, PA-C   100 mg at 02/20/13 0744  . traZODone (DESYREL) tablet 50 mg  50 mg Oral QHS Verne Spurr, PA-C   50 mg at 02/19/13 2138  . venlafaxine XR (EFFEXOR-XR) 24 hr capsule 75 mg  75 mg Oral QAC breakfast Verne Spurr, PA-C   75 mg at 02/20/13 0602    Lab Results: No results found for this or any previous visit (from the past 48 hour(s)).  Physical Findings: AIMS: Facial and Oral Movements Muscles of Facial Expression: None, normal Lips and Perioral Area: None, normal Jaw: None, normal Tongue: None, normal,Extremity Movements Upper (arms, wrists, hands, fingers): None, normal Lower (legs, knees, ankles, toes): None, normal, Trunk Movements Neck, shoulders, hips: None, normal, Overall Severity Severity of abnormal movements (highest score from questions above): None, normal Incapacitation due to abnormal movements: None, normal Patient's awareness of abnormal movements (rate only patient's report): No Awareness, Dental Status Current problems with teeth and/or dentures?: No Does patient usually wear dentures?: No  CIWA:  CIWA-Ar Total: 4 COWS:     Treatment Plan Summary: Daily contact with patient to assess and evaluate symptoms and progress in treatment Medication management  Plan: Supportive approach/coping skills/relapse  prevention/CBT           Complete the Librium detox protocol           Continue Effexor XR 75 mg daily           Reassess Blood glucose (HgA1C: fluctuating Glucose level, B 12 and Folate level: high MCV)  Medical Decision Making Problem Points:  New problem, with additional work-up planned (4) and Review of psycho-social stressors (1) Data Points:  Review of medication regiment & side effects (2) Review of new medications or change in dosage (2)  I certify that inpatient services furnished can reasonably be expected to improve the patient's condition.   LUGO,IRVING A 02/20/2013, 4:43 PM

## 2013-02-20 NOTE — Progress Notes (Signed)
[facility] LCSW Aftercare Discharge Planning Group Note  02/20/2013 8:45 AM  Participation Quality:  Appropriate  Affect: Appropriate  Cognitive: Appropriate  Insight:  Developing  Engagement in Group: Engaged  Modes of Intervention:  Exploration, Dissuccion, Support  Summary of Progress/Problems:  Pt denies both suicidal and homicidal ideation.  On a scale of 1 to 10 with ten being the most ever experienced, the patient rates depression at a 5 and anxiety at a 3. Patient reports she will return to home she shares with husband and follow up at [facility].   Clide Dales

## 2013-02-20 NOTE — Plan of Care (Signed)
Problem: Ineffective individual coping Goal: STG: Patient will participate in after care plan Outcome: Progressing Patient invested in following up with current providers  Problem: Alteration in mood & ability to function due to Goal: LTG-Patient demonstrates decreased signs of withdrawal (Patient demonstrates decreased signs of withdrawal to the point the patient is safe to return home and continue treatment in an outpatient setting)  Outcome: Progressing Patient progressing with detox protocol yet still has withdrawal symptoms, esp tremors  Problem: Alteration in mood; excessive anxiety as evidenced by: Goal: LTG-Patient's behavior demonstrates decreased anxiety (Patient's behavior demonstrates anxiety and he/she is utilizing learned coping skills to deal with anxiety-producing situations)  Outcome: Progressing Patient reports anxiety is at a 3 today in morning discharge planning group  Problem: Alteration in mood Goal: LTG-Pt's behavior demonstrates decreased signs of depression (Patient's behavior demonstrates decreased signs of depression to the point the patient is safe to return home and continue treatment in an outpatient setting)  Outcome: Progressing  On a scale of 1 to 10 with ten being the most ever experienced, the patient rates depression at a 5 today in morning discharge planning group

## 2013-02-21 MED ORDER — CYANOCOBALAMIN 1000 MCG/ML IJ SOLN: 1000.0000 ug | INTRAMUSCULAR | Status: DC

## 2013-02-21 MED ORDER — FUROSEMIDE 8 MG/ML PO SOLN: 20.0000 mg | Freq: Once | ORAL | Status: DC

## 2013-02-21 MED ORDER — CYANOCOBALAMIN 1000 MCG/ML IJ SOLN
1000.0000 ug | Freq: Every day | INTRAMUSCULAR | Status: DC
  Administered 2013-02-21 – 2013-02-23 (×3): 1000 ug via INTRAMUSCULAR
  Filled 2013-02-21 (×4): qty 1

## 2013-02-21 MED ORDER — FUROSEMIDE 20 MG PO TABS
20.0000 mg | ORAL_TABLET | Freq: Once | ORAL | Status: AC
  Administered 2013-02-21: 20 mg via ORAL
  Filled 2013-02-21 (×2): qty 1

## 2013-02-21 NOTE — Progress Notes (Signed)
Pt has been up and active today.  She rated her depression and anxiety a 2 and her hopelessness a 1 on her self-inventory. She stated,"my anxiety is only the 2 because I need a cigarette" she has c/o of some swelling to BLE.  She talked to Dr. Dub Mikes and he told her to watch her salt content and to keep her extremities up.  He went over her medication with pt and decided it was not medication related. She plans to return home with her husband.  He travels so she would stay with her parents when he is gone.  She already has psychiatrist and therapist that she plans to continue to see.  54 Dr Dub Mikes just ordered 20 mg lasix now so given.  Will report to oncoming shift.

## 2013-02-21 NOTE — Progress Notes (Signed)
Patient ID: Kelly Zuniga, female   DOB: [birth year], 47 y.o.   MRN: [id number]  D:  Pt informed the writer that she was here at 47 yrs old in 29 or 67.  Stated, "I drink a bottle of wine a day".  Stated that her husband convinced her to quit her job due to long hours. However, pt stated her husband continues to work long hours and as a result she sometimes feels lonely . Stated they had an agreement for her to take a 6 month sabbatical, but states her job search hasn't gone well.  Pt states it's a combination of things that have led to her increased alcohol consumption.  A:  Support and encouragement was offered. 15 min checks continued for safety.  R: Pt remains safe.

## 2013-02-21 NOTE — Progress Notes (Signed)
Patient ID: Kelly Zuniga, female   DOB: 02/04/1966, 47 y.o.   MRN: [id number]  D: Pt was pleasant and cooperative. Was positive for groups and voiced no complaints. Pt did comment on the edema in her legs, stating that they'd gone down. "I was in pain earlier, but it's gone now". Pt showed writer how the rt leg/ankle swelling had decreased almost back to normal.  A:  Support and encouragement was offered. 15 min checks continued for safety.  R: Pt remains safe.

## 2013-02-21 NOTE — Progress Notes (Signed)
Recreation Therapy Notes  Date: 03.04.2014 Time: 3:00pm Location: Art Room      Group Topic/Focus: Coping Skills  Participation Level: Active  Participation Quality: Appropriate  Affect: Euthymic  Cognitive: Appropriate  Additional Comments: Patient was given a copy of "Feed the Positive Dog" by Mendel Ryder. Patient was asked to read the 11 positive activities and pick the one that most appealed to her. Patient was then asked to color a worksheet with the outline of a dog to represent which positive activity she chose. Patient chose Day 64 - Lose yourself in the moment. Patient stated that she colored her worksheet as if the dog had painted himself. Patient stated she enjoys painting and can "get lost for hours behind a canvas." Patient stated she can use painting as a coping mechanism post discharge.     Marykay Lex Blanchfield, LRT/CTRS    Jearl Klinefelter 02/21/2013 3:59 PM

## 2013-02-21 NOTE — Progress Notes (Signed)
[facility] MD Progress Note  02/21/2013 12:31 PM Kelly Zuniga  MRN:  [id number] Subjective:  [name] developed some swelling of her legs. It was present this morning. She has been drinking a lot of Gatorade. She also states that she eats pretty healthy and she is not used to a lot of salt on her meals. She is still feeling restless, shaky. Her B12 level is on the low end of normal. Her Hg A1C is in the normal range. She is aware of the need to abstain from using alcohol. She is feeling anxious, worrying about what is going on physically with her Diagnosis:  Alcohol Dependence/withdrawal/GAD/Panic Attacks  ADL's:  Intact  Sleep: Fair  Appetite:  Fair  Suicidal Ideation:  Plan:  denies Intent:  denies Means:  denies Homicidal Ideation:  Plan:  denies Intent:  denies Means:  denies AEB (as evidenced by):  Psychiatric Specialty Exam: Review of Systems  Constitutional: Negative.   HENT: Negative.   Eyes: Negative.   Respiratory: Negative.   Cardiovascular: Positive for leg swelling.  Gastrointestinal: Negative.   Genitourinary: Negative.   Musculoskeletal: Negative.   Skin: Negative.   Neurological: Positive for tremors.  Endo/Heme/Allergies: Negative.   Psychiatric/Behavioral: Positive for depression and substance abuse. The patient is nervous/anxious.     Blood pressure 106/68, pulse 92, temperature 97.5 F (36.4 C), temperature source Oral, resp. rate 16, height 5' (1.524 m), weight 45.36 kg (100 lb), last menstrual period 02/16/2013.Body mass index is 19.53 kg/(m^2).  General Appearance: Fairly Groomed  Patent attorney::  Fair  Speech:  Clear and Coherent  Volume:  Normal  Mood:  Anxious and worried  Affect:  worried, anxious  Thought Process:  Coherent and Goal Directed  Orientation:  Full (Time, Place, and Person)  Thought Content:  worries, concerns, somatically focused  Suicidal Thoughts:  No  Homicidal Thoughts:  No  Memory:  Immediate;   Fair Recent;   Fair Remote;   Fair   Judgement:  Fair  Insight:  Present  Psychomotor Activity:  Restlessness  Concentration:  Fair  Recall:  Fair  Akathisia:  No  Handed:  Right  AIMS (if indicated):     Assets:  Desire for Improvement Housing Social Support  Sleep:  Number of Hours: 6.5   Current Medications: Current Facility-Administered Medications  Medication Dose Route Frequency Charleton Deyoung Last Rate Last Dose  . acetaminophen (TYLENOL) tablet 650 mg  650 mg Oral Q6H PRN Verne Spurr, PA-C      . albuterol (PROVENTIL HFA;VENTOLIN HFA) 108 (90 BASE) MCG/ACT inhaler 2 puff  2 puff Inhalation Q4H PRN Verne Spurr, PA-C      . alum & mag hydroxide-simeth (MAALOX/MYLANTA) 200-200-20 MG/5ML suspension 30 mL  30 mL Oral PRN Verne Spurr, PA-C      . Melene Muller ON 02/22/2013] chlordiazePOXIDE (LIBRIUM) capsule 25 mg  25 mg Oral Daily Verne Spurr, PA-C      . cyanocobalamin ((VITAMIN B-12)) injection 1,000 mcg  1,000 mcg Intramuscular Daily Rachael Fee, MD       Followed by  . [START ON 02/28/2013] cyanocobalamin ((VITAMIN B-12)) injection 1,000 mcg  1,000 mcg Intramuscular Q7 days Rachael Fee, MD       Followed by  . [START ON 03/28/2013] cyanocobalamin ((VITAMIN B-12)) injection 1,000 mcg  1,000 mcg Intramuscular Q30 days Rachael Fee, MD      . dicyclomine (BENTYL) tablet 20 mg  20 mg Oral TID Verne Spurr, PA-C   20 mg at 02/21/13 1150  . fluticasone (  FLONASE) 50 MCG/ACT nasal spray 1 spray  1 spray Each Nare Daily Verne Spurr, PA-C   1 spray at 02/21/13 0805  . fluticasone (FLOVENT HFA) 44 MCG/ACT inhaler 1 puff  1 puff Inhalation BID Verne Spurr, PA-C   1 puff at 02/21/13 0806  . ibuprofen (ADVIL,MOTRIN) tablet 600 mg  600 mg Oral Q8H PRN Verne Spurr, PA-C      . loperamide (IMODIUM) capsule 4 mg  4 mg Oral PRN Shuvon Rankin, NP   4 mg at 02/18/13 1147  . magnesium hydroxide (MILK OF MAGNESIA) suspension 30 mL  30 mL Oral Daily PRN Verne Spurr, PA-C      . montelukast (SINGULAIR) tablet 10 mg  10 mg Oral QHS  Verne Spurr, PA-C   10 mg at 02/20/13 2131  . multivitamin with minerals tablet 1 tablet  1 tablet Oral Daily Verne Spurr, PA-C   1 tablet at 02/21/13 4098  . nicotine (NICODERM CQ - dosed in mg/24 hours) patch 21 mg  21 mg Transdermal Daily Verne Spurr, PA-C   21 mg at 02/20/13 0744  . SUMAtriptan (IMITREX) tablet 50 mg  50 mg Oral Q2H PRN Verne Spurr, PA-C      . thiamine (VITAMIN B-1) tablet 100 mg  100 mg Oral Daily Verne Spurr, PA-C   100 mg at 02/21/13 0806  . traZODone (DESYREL) tablet 50 mg  50 mg Oral QHS Verne Spurr, PA-C   50 mg at 02/20/13 2131  . venlafaxine XR (EFFEXOR-XR) 24 hr capsule 75 mg  75 mg Oral QAC breakfast Verne Spurr, PA-C   75 mg at 02/21/13 1191    Lab Results:  Results for orders placed during the hospital encounter of 02/17/13 (from the past 48 hour(s))  VITAMIN B12     Status: None   Collection Time    02/20/13  7:59 PM      Result Value Range   Vitamin B-12 261  211 - 911 pg/mL  HEMOGLOBIN A1C     Status: None   Collection Time    02/20/13  7:59 PM      Result Value Range   Hemoglobin A1C 4.8  <5.7 %   Comment: (NOTE)                                                                               According to the ADA Clinical Practice Recommendations for 2011, when     HbA1c is used as a screening test:      >=6.5%   Diagnostic of Diabetes Mellitus               (if abnormal result is confirmed)     5.7-6.4%   Increased risk of developing Diabetes Mellitus     References:Diagnosis and Classification of Diabetes Mellitus,Diabetes     Care,2011,34(Suppl 1):S62-S69 and Standards of Medical Care in             Diabetes - 2011,Diabetes Care,2011,34 (Suppl 1):S11-S61.   Mean Plasma Glucose 91  <117 mg/dL    Physical Findings: AIMS: Facial and Oral Movements Muscles of Facial Expression: None, normal Lips and Perioral Area: None, normal Jaw: None, normal Tongue: None, normal,Extremity Movements Upper (arms,  wrists, hands, fingers): None,  normal Lower (legs, knees, ankles, toes): None, normal, Trunk Movements Neck, shoulders, hips: None, normal, Overall Severity Severity of abnormal movements (highest score from questions above): None, normal Incapacitation due to abnormal movements: None, normal Patient's awareness of abnormal movements (rate only patient's report): No Awareness, Dental Status Current problems with teeth and/or dentures?: No Does patient usually wear dentures?: No  CIWA:  CIWA-Ar Total: 4 COWS:     Treatment Plan Summary: Daily contact with patient to assess and evaluate symptoms and progress in treatment Medication management  Plan: Supportive approach/coping skills/relapse prevention           Note: she elevated her legs during the day but it did not help. She states it is hurting           Will try Lasix 20-40 mg           Medical Decision Making Problem Points:  Review of psycho-social stressors (1) Data Points:  Review of medication regiment & side effects (2) Review of new medications or change in dosage (2)  I certify that inpatient services furnished can reasonably be expected to improve the patient's condition.   LUGO,IRVING A 02/21/2013, 12:31 PM

## 2013-02-21 NOTE — Progress Notes (Signed)
  02/20/2013  1:15 PM   Type of Therapy:  Group Therapy 1:15 to 2:30 PM  Participation Level:  Minimal as patient was called out during group  Participation Quality:  Sharing  Affect: Appropriate  Cognitive:  Appropriate  Insight:  Developing, improving  Engagement in Therapy:  Good while present  Modes of Intervention:  Exploration, discussion, support  Summary of Progress/Problems:  Group discussion focused on what patient's see as their own obstacles to recovery.  Patient shares belief that her career change will continue to be be an obstacle as she now has so much free time. Patient left group early before processing took place.  Clide Dales

## 2013-02-21 NOTE — [facility] Counselor (Signed)
Adult Comprehensive Assessment  Patient ID: Kelly Zuniga, female   DOB: March 17, 1966, 47 y.o.   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 Social relationships: NA Substance abuse: History and current Bereavement / Loss: Aunt Summer 2013  Living/Environment/Situation:  Living Arrangements: Spouse/significant other Living conditions (as described by patient or guardian): great house How long has patient lived in current situation?: 17 years What is atmosphere in current home: Comfortable;Supportive  Family History:  Marital status: Married Number of Years Married: 3 What types of issues is patient dealing with in the relationship?: Patient recently quit job of 10 years, Husband out a lot due to occupation as a Naval architect and 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 Additional relationship information: None shared other than fact they have been together for 17 years Does patient have children?: No  Childhood History:  By whom was/is the patient raised?: Both parents Additional childhood history information: Great childhood Description of patient's relationship with caregiver when they were a child: Good with both Patient's description of current relationship with people who raised him/her: Good with both Does patient have siblings?: Yes Number of Siblings: 2 Description of patient's current relationship with siblings: Good with both Did patient suffer any verbal/emotional/physical/sexual abuse as a child?: No Did patient suffer from severe childhood neglect?: No Has patient ever been sexually abused/assaulted/raped as an  adolescent or adult?: No Was the patient ever a victim of a crime or a disaster?: No Witnessed domestic violence?: No Has patient been effected by domestic violence as an adult?: No  Education:  Currently a Consulting civil engineer?: No Learning disability?: No  Employment/Work Situation:   Employment situation: Unemployed Patient's job has been impacted by current illness: No What is the longest time patient has a held a job?: 10 years 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 Has patient ever been in the [employer]?: No Has patient ever served in Buyer, retail?: No  Financial Resources:   Surveyor, quantity resources: Support from parents / caregiver;Income from spouse Does patient have a representative payee or guardian?: No  Alcohol/Substance Abuse:   What has been your use of drugs/alcohol within the last 12 months?: Alcohol daily since 12/11/12; multiple changes in anti depressants and anti anxiety meds for last several months If attempted suicide, did drugs/alcohol play a role in this?:  (NA) Alcohol/Substance Abuse Treatment Hx: Denies past history Has alcohol/substance abuse ever caused legal problems?: No  Social Support System:   Conservation officer, nature Support System: Good Describe Community Support System: Spouse, parents, siblings, multiple friends Type of faith/religion: Presbyterian How does patient's faith help to cope with current illness?: Prayer helps, use a daily Bible App on phone, faith helps  Leisure/Recreation:   Leisure and Hobbies: Gold, sports Marketing executive  Strengths/Needs:   What things does the patient do well?: Sketching, focus and golf In what areas does patient struggle / problems for patient: Sleep, anxiety and addicted to cell phone  Discharge Plan:   Does patient have access to transportation?: Yes Will patient be returning to same living situation after discharge?: Yes Currently receiving community mental health services: Yes (From Whom) Does patient  have financial barriers related to discharge medications?: No  Summary/Recommendations:  Summary and Recommendations (to be completed by the evaluator): Patient is 47 YO married unemployed (by choice) Caucasian female admitted with diagnosis of  Major depressive disorder, 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 Patient would benefit from crisis stabilization, medication evaluation, therapy groups for processing thoughts/feelings/experiences, psycho ed groups for coping skills, and case management for discharge planning  Clide Dales. 02/21/2013

## 2013-02-22 DIAGNOSIS — F10239 Alcohol dependence with withdrawal, unspecified: Secondary | ICD-10-CM

## 2013-02-22 DIAGNOSIS — F411 Generalized anxiety disorder: Secondary | ICD-10-CM

## 2013-02-22 DIAGNOSIS — F410 Panic disorder [episodic paroxysmal anxiety] without agoraphobia: Secondary | ICD-10-CM

## 2013-02-22 MED ORDER — FUROSEMIDE 20 MG PO TABS
20.0000 mg | ORAL_TABLET | Freq: Once | ORAL | Status: AC
  Administered 2013-02-22: 20 mg via ORAL
  Filled 2013-02-22 (×2): qty 1

## 2013-02-22 NOTE — Progress Notes (Signed)
Adult Psychoeducational Group Note  Date:  02/22/2013 Time:  10:09 PM  Group Topic/Focus:  AA group  Participation Level:  Active  Participation Quality:  Appropriate  Affect:  Appropriate  Cognitive:  Alert  Insight: Appropriate  Engagement in Group:  Improving  Modes of Intervention:  Discussion  Additional Comments:    Flonnie Hailstone 02/22/2013, 10:09 PM

## 2013-02-22 NOTE — Progress Notes (Signed)
Due to a issue that arised 11 am psycho educational group was moved to 8:45 am. This mornings group topic was Personal Development. Staff and patients were able to discuss how to decide and how to make rational decisions in and out of hospital environment. Patients were encouraged to come up with identifying values that pertain to that patient and values that they would like to work on. Staff also encouraged patients to come up with a goal of the day to encourage patients to become a better person and that apply to there daily living skills. Patient was cooperative and interactive in group. 

## 2013-02-22 NOTE — Progress Notes (Signed)
Discharge Planning Group   02/21/2013 8:45 AM  Participation Quality:  Appropriate  Affect: Appropriate  Cognitive: Alert and Oriented  Insight:  Improving  Engagement in Group: Developing  Modes of Intervention: Clarification, exploration and support  Summary of Progress/Problems:  Pt denies both suicidal and homicidal ideation.  On a scale of 1 to 10 with ten being the most ever experienced, the patient rates depression at a 2 and anxiety at a 2. Patient expressed concern regarding morning swelling of feet and ankles today which she has never experienced before. Patient agreed to discuss with physician/nurse practitioner.   Clide Dales

## 2013-02-22 NOTE — Progress Notes (Signed)
[facility] LCSW Group Therapy  02/22/2013   Type of Therapy:  Group Therapy from 1:15 to 2:30 PM  Participation Level:  Active  Participation Quality:  Appropriate  Affect:  Appropriate  Cognitive:  Appropriate  Insight:  Engaged  Engagement in Therapy:  Engaged  Modes of Intervention:  Discussion, Exploration, Problem-solving and Support  Summary of Progress/Problems: The focus of this group session was to process how we deal with difficult emotions and share with others the patterns that play out when we are reacting to the emotion verses the situation.  Patient's agreed that one of the most difficult emotions to deal with is anger.  Kelly Zuniga was able to process that there is usually something behind her anger that she chooses not to admit too.  "One of the things that is so helpful avbout having a professional therapist is you have someone you can speak your truth too."    [name] [name] 02/22/2013,6:48 PM

## 2013-02-22 NOTE — Progress Notes (Signed)
[facility] LCSW Group Therapy  02/21/2013 1:15 to 2:30 PM   Type of Therapy:  Group Therapy  Participation Level: Appropriate  Participation Quality: Attentive and Sharing  Affect:  Appropriate  Cognitive:  Appropriate  Insight: Developing  Engagement in Therapy:  Engaged  Modes of Intervention:  Orientation, Discussion, Exploration, Socialization and Support  Summary of Progress/Problems:Focus of this processing group was feelings about diagnosis; discussion included denial, bargaining, anger, depression, and acceptance to which patients related to in varying degrees. [name] shared that her early life experience of acceptance of depression and anxiety have probably allowed her faster acceptance of this new addiction diagnosis.  [name] shared how her request to family at lunch to refrain from drinking when she visits was accepted well by family who is supportive.     Kelly Zuniga

## 2013-02-22 NOTE — Progress Notes (Signed)
[facility] LCSW Aftercare Discharge Planning Group Note  02/22/2013   Participation Quality:  Appropriate  Affect:  Appropriate  Cognitive:  Appropriate  Insight:  Engaged  Engagement in Group:  Engaged  Modes of Intervention:  Clarification, Exploration, Problem-solving and Support  Summary of Progress/Problems:  Pt denies both suicidal and homicidal ideation.  On a scale of 1 to 10 with ten being the most ever experienced, the patient rates depression at a 1 and anxiety at a 2. Lizvet reports her willingness to comply with medications prescribed, attend AA meetings.  She has a goal to improve organizational skills this year. [name] agreed to remain inpatient in order for medical staff to monitor swelling in legs and ankles although she was hoping to discharge home.    Clide Dales 02/22/2013, 6:07 PM

## 2013-02-22 NOTE — Progress Notes (Signed)
Patient ID: Kelly Zuniga, female   DOB: [birth year], 47 y.o.   MRN: [id number] [facility] MD Progress Note  02/22/2013 12:19 PM Kelly Zuniga  MRN:  [id number]  Subjective:  Kelly Zuniga still has some swellings to her left calf. However, the swellings to her bilateral feet and ankles has resolved. She is no longer complaining of pain to her leg areas. She states that her mood is improving on daily basis. She continue to experience some tremors to bilateral hands. She has an appointment to follow-up with her primary health care provider Dr. Clent Ridges with the [facility] health care after discharge from here. She has appointment to see her psychologist Dr. Dellia Cloud on the 02/23/13. She is a probable discharge in am.  Diagnosis:  Alcohol Dependence/withdrawal/GAD/Panic Attacks  ADL's:  Intact  Sleep: Good, 6.5 hours per documentation.  Appetite:  Fair  Suicidal Ideation:  Plan:  denies Intent:  denies Means:  denies Homicidal Ideation:  Plan:  denies Intent:  denies Means:  denies  AEB (as evidenced by): Per patient's reports.  Psychiatric Specialty Exam: Review of Systems  Constitutional: Negative.   HENT: Negative.   Eyes: Negative.   Respiratory: Negative.   Cardiovascular: Positive for leg swelling.  Gastrointestinal: Negative.   Genitourinary: Negative.   Musculoskeletal: Negative.   Skin: Negative.   Neurological: Positive for tremors.  Endo/Heme/Allergies: Negative.   Psychiatric/Behavioral: Positive for depression and substance abuse. The patient is nervous/anxious.     Blood pressure 94/60, pulse 102, temperature 97.9 F (36.6 C), temperature source Oral, resp. rate 14, height 5' (1.524 m), weight 45.36 kg (100 lb), last menstrual period 02/16/2013.Body mass index is 19.53 kg/(m^2).  General Appearance: Fairly Groomed  Patent attorney::  Fair  Speech:  Clear and Coherent  Volume:  Normal  Mood:  Anxious and yet hopeful.  Affect:  Congruent with mood.  Thought Process:  Coherent and Goal  Directed  Orientation:  Full (Time, Place, and Person)  Thought Content:  worries, concerns, somatically focused  Suicidal Thoughts:  No  Homicidal Thoughts:  No  Memory:  Immediate;   Fair Recent;   Fair Remote;   Fair  Judgement:  Fair  Insight:  Present  Psychomotor Activity:  Some bilateral hand tremors.  Concentration:  Fair  Recall:  Fair  Akathisia:  No  Handed:  Right  AIMS (if indicated):     Assets:  Desire for Improvement Housing Social Support  Sleep:  Number of Hours: 6.5   Current Medications: Current Facility-Administered Medications  Medication Dose Route Frequency Provider Last Rate Last Dose  . acetaminophen (TYLENOL) tablet 650 mg  650 mg Oral Q6H PRN Verne Spurr, PA-C      . albuterol (PROVENTIL HFA;VENTOLIN HFA) 108 (90 BASE) MCG/ACT inhaler 2 puff  2 puff Inhalation Q4H PRN Verne Spurr, PA-C      . alum & mag hydroxide-simeth (MAALOX/MYLANTA) 200-200-20 MG/5ML suspension 30 mL  30 mL Oral PRN Verne Spurr, PA-C      . cyanocobalamin ((VITAMIN B-12)) injection 1,000 mcg  1,000 mcg Intramuscular Daily Rachael Fee, MD   1,000 mcg at 02/22/13 5621   Followed by  . [START ON 02/28/2013] cyanocobalamin ((VITAMIN B-12)) injection 1,000 mcg  1,000 mcg Intramuscular Q7 days Rachael Fee, MD       Followed by  . [START ON 03/28/2013] cyanocobalamin ((VITAMIN B-12)) injection 1,000 mcg  1,000 mcg Intramuscular Q30 days Rachael Fee, MD      . dicyclomine (BENTYL) tablet 20 mg  20 mg Oral  TID Verne Spurr, PA-C   20 mg at 02/22/13 1203  . fluticasone (FLONASE) 50 MCG/ACT nasal spray 1 spray  1 spray Each Nare Daily Verne Spurr, PA-C   1 spray at 02/22/13 [phone]  . fluticasone (FLOVENT HFA) 44 MCG/ACT inhaler 1 puff  1 puff Inhalation BID Verne Spurr, PA-C   1 puff at 02/22/13 1191  . furosemide (LASIX) tablet 20 mg  20 mg Oral Once Sanjuana Kava, NP      . ibuprofen (ADVIL,MOTRIN) tablet 600 mg  600 mg Oral Q8H PRN Verne Spurr, PA-C      . loperamide  (IMODIUM) capsule 4 mg  4 mg Oral PRN Shuvon Rankin, NP   4 mg at 02/18/13 1147  . magnesium hydroxide (MILK OF MAGNESIA) suspension 30 mL  30 mL Oral Daily PRN Verne Spurr, PA-C      . montelukast (SINGULAIR) tablet 10 mg  10 mg Oral QHS Verne Spurr, PA-C   10 mg at 02/21/13 2148  . multivitamin with minerals tablet 1 tablet  1 tablet Oral Daily Verne Spurr, PA-C   1 tablet at 02/22/13 4782  . nicotine (NICODERM CQ - dosed in mg/24 hours) patch 21 mg  21 mg Transdermal Daily Verne Spurr, PA-C   21 mg at 02/22/13 9562  . SUMAtriptan (IMITREX) tablet 50 mg  50 mg Oral Q2H PRN Verne Spurr, PA-C      . thiamine (VITAMIN B-1) tablet 100 mg  100 mg Oral Daily Verne Spurr, PA-C   100 mg at 02/22/13 1308  . traZODone (DESYREL) tablet 50 mg  50 mg Oral QHS Verne Spurr, PA-C   50 mg at 02/21/13 2148  . venlafaxine XR (EFFEXOR-XR) 24 hr capsule 75 mg  75 mg Oral QAC breakfast Verne Spurr, PA-C   75 mg at 02/22/13 6578    Lab Results:  Results for orders placed during the hospital encounter of 02/17/13 (from the past 48 hour(s))  VITAMIN B12     Status: None   Collection Time    02/20/13  7:59 PM      Result Value Range   Vitamin B-12 261  211 - 911 pg/mL  HEMOGLOBIN A1C     Status: None   Collection Time    02/20/13  7:59 PM      Result Value Range   Hemoglobin A1C 4.8  <5.7 %   Comment: (NOTE)                                                                               According to the ADA Clinical Practice Recommendations for 2011, when     HbA1c is used as a screening test:      >=6.5%   Diagnostic of Diabetes Mellitus               (if abnormal result is confirmed)     5.7-6.4%   Increased risk of developing Diabetes Mellitus     References:Diagnosis and Classification of Diabetes Mellitus,Diabetes     Care,2011,34(Suppl 1):S62-S69 and Standards of Medical Care in             Diabetes - 2011,Diabetes Care,2011,34 (Suppl 1):S11-S61.   Mean Plasma Glucose 91  <  117 mg/dL     Physical Findings: AIMS: Facial and Oral Movements Muscles of Facial Expression: None, normal Lips and Perioral Area: None, normal Jaw: None, normal Tongue: None, normal,Extremity Movements Upper (arms, wrists, hands, fingers): None, normal Lower (legs, knees, ankles, toes): None, normal, Trunk Movements Neck, shoulders, hips: None, normal, Overall Severity Severity of abnormal movements (highest score from questions above): None, normal Incapacitation due to abnormal movements: None, normal Patient's awareness of abnormal movements (rate only patient's report): No Awareness, Dental Status Current problems with teeth and/or dentures?: No Does patient usually wear dentures?: No  CIWA:  CIWA-Ar Total: 0 COWS:     Treatment Plan Summary: Daily contact with patient to assess and evaluate symptoms and progress in treatment Medication management  Plan: Supportive approach/coping skills/relapse prevention Encouraged to try to keep legs elevated to her level of comfort. Administer 1 additional dose of Lasix 20 mg po x 1. Instructed to follow-up with primary care physician after discharge. Continue current treatment plan.                Medical Decision Making Problem Points:  Review of psycho-social stressors (1) Data Points:  Review of medication regiment & side effects (2) Review of new medications or change in dosage (2)  I certify that inpatient services furnished can reasonably be expected to improve the patient's condition.   Armandina Stammer I 02/22/2013, 12:19 PM

## 2013-02-22 NOTE — Progress Notes (Signed)
Patient ID: Kelly Zuniga, female   DOB: 20-Mar-1966, 47 y.o.   MRN: [id number]  D: Pt was brighter today than previous days. "The Dr said I was going to get folate tonight". Writer informed pt that folate wasn't scheduled. Informed pt that writer would look at the notes to see if there was a mention of folate. Pt informed the writer that "she doesn't want the folate thing to keep her from going home tomorrow".   A:  Support and encouragement was offered. 15 min checks continued for safety.  R: Pt remains safe.

## 2013-02-22 NOTE — Progress Notes (Signed)
Pt continues to improve her BLE are slightly swollen and she was given a one-time dose of lasix at 1324 today.  She has been up for groups and interacting with peers appropriately.  She denies any S/H ideation or A/V hallucinations. She plans to live with her husband but when he is out of town she plans to have one of her parents stay with her.  She hoping to discharge tomorrow.

## 2013-02-22 NOTE — Progress Notes (Signed)
Recreation Therapy Notes  Date: 03.05.2014  Time: 2:45pm  Location: [address] Day Room   Group Topic/Focus: Musician (AAA/T)   Participation Level:  Active   Participation Quality:  Appropriate   Affect:  Euthymic   Cognitive:  Appropriate   Additional Comments: Patient pet and visited with Summer, the dog. Patient fed summer a treat. Patient interacted appropriately with LRT, peers and dog team.   Jearl Klinefelter, LRT/CTRS  Jearl Klinefelter 02/22/2013 4:12 PM

## 2013-02-23 ENCOUNTER — Ambulatory Visit: Payer: 59 | Admitting: Psychology

## 2013-02-23 MED ORDER — TRIAMCINOLONE ACETONIDE(NASAL) 55 MCG/ACT NA INHA: 2.0000 | Freq: Every day | NASAL | Status: DC

## 2013-02-23 MED ORDER — FOLIC ACID 1 MG PO TABS: 1.0000 mg | ORAL_TABLET | Freq: Every day | ORAL | Status: DC

## 2013-02-23 MED ORDER — VENLAFAXINE HCL ER 75 MG PO CP24: 75.0000 mg | ORAL_CAPSULE | Freq: Every day | ORAL | Status: DC

## 2013-02-23 MED ORDER — ALBUTEROL SULFATE HFA 108 (90 BASE) MCG/ACT IN AERS: 2.0000 | INHALATION_SPRAY | RESPIRATORY_TRACT | Status: DC | PRN

## 2013-02-23 MED ORDER — BECLOMETHASONE DIPROPIONATE 40 MCG/ACT IN AERS: 2.0000 | INHALATION_SPRAY | Freq: Every day | RESPIRATORY_TRACT | Status: DC

## 2013-02-23 MED ORDER — ADULT MULTIVITAMIN W/MINERALS CH: 1.0000 | ORAL_TABLET | Freq: Every day | ORAL | Status: DC

## 2013-02-23 MED ORDER — TRAZODONE HCL 50 MG PO TABS: 50.0000 mg | ORAL_TABLET | Freq: Every day | ORAL | Status: DC

## 2013-02-23 MED ORDER — MONTELUKAST SODIUM 10 MG PO TABS: 10.0000 mg | ORAL_TABLET | Freq: Every day | ORAL | Status: DC

## 2013-02-23 MED ORDER — FOLIC ACID 1 MG PO TABS
1.0000 mg | ORAL_TABLET | Freq: Every day | ORAL | Status: DC
  Administered 2013-02-23: 1 mg via ORAL
  Filled 2013-02-23 (×2): qty 1

## 2013-02-23 NOTE — Progress Notes (Signed)
[facility] LCSW Aftercare Discharge Planning Group Note  02/23/2013 11:36 AM  Participation Quality:  Appropriate  Affect:  Appropriate  Cognitive:  Appropriate  Insight:  Engaged  Engagement in Group:  Engaged  Modes of Intervention:  Clarification, Exploration, Rapport Building and Support  Summary of Progress/Problems: Pt denies both suicidal and homicidal ideation.  On a scale of 1 to 10 with ten being the most ever experienced, the patient rates depression at a 1 and anxiety at a 1. Patient requests ROI for medical appointment tomorrow and will be provided AA Schedule.    Clide Dales 02/23/2013, 11:36 AM

## 2013-02-23 NOTE — Progress Notes (Signed)
Discharge Note  D: Patient's mood was appropriate to the circumstance. She reported on the self inventory sheet that her appetite is good, energy level is normal and ability to pay attention is good. Patient rated depression and feelings of hopelessness "1".  A: Support and encouragement provided. Administered scheduled medications per ordering MD. Discharge instructions/prescriptions/medication samples given to patient. Returned belongings to patient.  R: Patient receptive. Denies SI/HI/AVH. Patient d/c without incident. Patient verbalized understanding of discharge instructions and prescriptions.

## 2013-02-23 NOTE — [facility] Suicide Risk Assessment (Signed)
Suicide Risk Assessment  Discharge Assessment     Demographic Factors:  Caucasian  Mental Status Per Nursing Assessment::   On Admission:  NA  Current Mental Status by Physician: In full contact with reality. There are no suicidal ideas, plans or intent. Her mood is euthymic, Her affect is appropriate. She is willing and motivated to pursue outpatient treatment. She is committed to abstinence as aware of all the adverse effects of alcohol on her system   Loss Factors: NA  Historical Factors: NA  Risk Reduction Factors:   Sense of responsibility to family, Living with another person, especially a relative, Positive social support and Positive therapeutic relationship  Continued Clinical Symptoms:  Depression:   Comorbid alcohol abuse/dependence Alcohol/Substance Abuse/Dependencies  Cognitive Features That Contribute To Risk: None identified   Suicide Risk:  Minimal: No identifiable suicidal ideation.  Patients presenting with no risk factors but with morbid ruminations; may be classified as minimal risk based on the severity of the depressive symptoms  Discharge Diagnoses:   AXIS I:  Alcohol Dependence, Anxiety Disorder NOS, Depressive Disorder NOS AXIS II:  Deferred AXIS III:   Past Medical History  Diagnosis Date  . Allergy   . Anxiety   . Asthma   . Depression     posibly bipolar disorder. Sees Dr [name] for psychiatric care and Dr [name] for therapy  . Headache     last migraine 2004  . Hypertension     never on meds    AXIS IV:  none identified AXIS V:  61-70 mild symptoms  Plan Of Care/Follow-up recommendations:  Activity:  as tolerated Diet:  regular Outpatient follow up with Dr. Dellia Cloud  Is patient on multiple antipsychotic therapies at discharge:  No   Has Patient had three or more failed trials of antipsychotic monotherapy by history:  No  Recommended Plan for Multiple Antipsychotic Therapies: N/A   LUGO,IRVING A 02/23/2013, 11:44  AM

## 2013-02-23 NOTE — Discharge Summary (Signed)
Physician Discharge Summary Note  Patient:  Kelly Zuniga is an 47 y.o., female MRN:  [id number] DOB:  04-17-1966 Patient phone:  [phone] (home)  Patient address:   [address] [place] [address],   Date of Admission:  02/17/2013  Date of Discharge: 02/23/13  Reason for Admission:  Alcohol intoxication  Discharge Diagnoses: Principal Problem:   Alcohol dependence Active Problems:   ANXIETY   Substance abuse  Review of Systems  Constitutional: Negative.   HENT: Negative.   Eyes: Negative.   Respiratory: Negative.   Cardiovascular: Positive for leg swelling (Resolving).  Gastrointestinal: Negative.   Genitourinary: Negative.   Musculoskeletal: Positive for myalgias (Resolving).  Skin: Negative.   Neurological: Positive for tremors (Resolving).  Endo/Heme/Allergies: Negative.   Psychiatric/Behavioral: Positive for depression (Stabilized with medication prior to discharge) and substance abuse. Negative for suicidal ideas, hallucinations and memory loss. The patient has insomnia (Stabilized with medication prior to discharge). The patient is not nervous/anxious.    Axis Diagnosis:   AXIS I:  Substance Abuse and Alcohol dependence AXIS II:  Deferred AXIS III:   Past Medical History  Diagnosis Date  . Allergy   . Anxiety   . Asthma   . Depression     posibly bipolar disorder. Sees Dr [name] for psychiatric care and Dr [name] for therapy  . Headache     last migraine 2004  . Hypertension     never on meds    AXIS IV:  other psychosocial or environmental problems and Alcohol dependency AXIS V:  64  Level of Care:  OP  Hospital Course:  Patient states that she had a panic attack and was referred to inpatient after going to [facility] ED.  After admission assessment and evaluation, it was determined that patient will need detoxification treatment to stabilize her system of alcohol intoxication and to combat the withdrawal symptoms of  alcohol. And her discharge plans included a referral to a psychiatric treatment facility/clinic for continuation of substance abuse treatment. [name] was then started on Librium protocol for her alcohol detoxification. She was also enrolled in group counseling sessions and activities to learn coping skills that should help her after discharge to cope better, manage her substance abuse problems to maintain a much longer sobriety. She also was enrolled and attended AA/NA meetings being offered and held on this unit. She has some previous and or identifiable medical conditions that required treatment and or monitoring. She received medication management for all those health issues/concerns as well. She was monitored closely for any potential problems that may arise as a result of and or during detoxification treatment. Patient tolerated her treatment regimen and detoxification treatment without any significant adverse effects and or reactions reported. She is here by cautioned and instructed to follow-up with her primary care provider for her health issues including lower extremity swellings. Although her lower extremity swellings improved prior to her discharge, she still will need to have this thoroughly evaluated by her doctor to determine the cause and how to prevent this from recurring in the future.    Patient attended treatment team meeting this am and met with the team. Her reason for admission,  symptoms, substance abuse issues, response to treatment and discharge plans discussed. Patient endorsed that he is doing well and stable for discharge to pursue the next phase of her substance abuse treatment. She will continue psychiatric treatment on an outpatient basis to maintain stability at the [facility] here in  [place], [place] on 03/02/13 at 04:00 pm with Dr. Dellia Cloud. For counseling sessions, patient is scheduled an appointment at the [facility] here in  [place] as well on 03/07/13 @ 03:20 pm. The addresses, dates, times and contact information for these clinics provided for patient in writing.  She was encouraged to join/attend AA/NA meetings being offered and held within his community. She is instructed and encouraged to get a trusted sponsor from the advise of others or from whomever within the AA meetings seems to make sense, and who has a proven track record, and will hold her responsible for her sobriety, and both expects and insists on her total  abstinence from alcohol. She must focus the first of each month on the speaker meetings where she will specifically look at how her life has been wrecked by drugs/alcohol and how her life has been similar to that of the speaker's life.   It was agreed upon between patient and the team that she will be discharged to the home that she shares with her family. Upon discharge, patient adamantly denies suicidal, homicidal ideations, auditory, visual hallucinations, delusional thinking and or withdrawal symptoms. Patient left [facility] with all personal belongings in no apparent distress. She received 4 days worth samples of her discharge medications. Transportation per family.   Consults:  None  Significant Diagnostic Studies:  labs: CBC with diff, CMP, B12 levels, folate levels, UDS, Toxicology tests  Discharge Vitals:   Blood pressure 84/53, pulse 102, temperature 98.1 F (36.7 C), temperature source Oral, resp. rate 12, height 5' (1.524 m), weight 45.36 kg (100 lb), last menstrual period 02/16/2013. Body mass index is 19.53 kg/(m^2). Lab Results:   Results for orders placed during the hospital encounter of 02/17/13 (from the past 72 hour(s))  VITAMIN B12     Status: None   Collection Time    02/20/13  7:59 PM      Result Value Range   Vitamin B-12 261  211 - 911 pg/mL  HEMOGLOBIN A1C     Status: None   Collection Time    02/20/13  7:59 PM      Result Value Range   Hemoglobin A1C 4.8  <5.7 %    Comment: (NOTE)                                                                               According to the ADA Clinical Practice Recommendations for 2011, when     HbA1c is used as a screening test:      >=6.5%   Diagnostic of Diabetes Mellitus               (if abnormal result is confirmed)     5.7-6.4%   Increased risk of developing Diabetes Mellitus     References:Diagnosis and Classification of Diabetes Mellitus,Diabetes     Care,2011,34(Suppl 1):S62-S69 and Standards of Medical Care in             Diabetes - 2011,Diabetes Care,2011,34 (Suppl 1):S11-S61.   Mean Plasma Glucose 91  <117 mg/dL  FOLATE RBC     Status: Abnormal   Collection Time    02/21/13  7:56 PM      Result  Value Range   RBC Folate 255 (*) >=366 ng/mL   Comment: Reference range not established for pediatric patients.    Physical Findings: AIMS: Facial and Oral Movements Muscles of Facial Expression: None, normal Lips and Perioral Area: None, normal Jaw: None, normal Tongue: None, normal,Extremity Movements Upper (arms, wrists, hands, fingers): None, normal Lower (legs, knees, ankles, toes): None, normal, Trunk Movements Neck, shoulders, hips: None, normal, Overall Severity Severity of abnormal movements (highest score from questions above): None, normal Incapacitation due to abnormal movements: None, normal Patient's awareness of abnormal movements (rate only patient's report): No Awareness, Dental Status Current problems with teeth and/or dentures?: No Does patient usually wear dentures?: No  CIWA:  CIWA-Ar Total: 0 COWS:     Psychiatric Specialty Exam: See Psychiatric Specialty Exam and Suicide Risk Assessment completed by Attending Physician prior to discharge.  Discharge destination:  Home  Is patient on multiple antipsychotic therapies at discharge:  No   Has Patient had three or more failed trials of antipsychotic monotherapy by history:  No  Recommended Plan for Multiple Antipsychotic  Therapies: NA       Future Appointments Provider Department Dept Phone   02/24/2013 11:15 AM Nelwyn Salisbury, MD [facility] [phone]       Medication List    STOP taking these medications       clonazePAM 0.5 MG tablet  Commonly known as:  KLONOPIN     dicyclomine 20 MG tablet  Commonly known as:  BENTYL     sertraline 50 MG tablet  Commonly known as:  ZOLOFT     SUMAtriptan 50 MG tablet  Commonly known as:  IMITREX      TAKE these medications     Indication   albuterol 108 (90 BASE) MCG/ACT inhaler  Commonly known as:  PROVENTIL HFA;VENTOLIN HFA  Inhale 2 puffs into the lungs every 4 (four) hours as needed. For shortness of breath   Indication:  Asthma     beclomethasone 40 MCG/ACT inhaler  Commonly known as:  QVAR  Inhale 2 puffs into the lungs at bedtime. For shortness of breath/Asthma   Indication:  Asthma     montelukast 10 MG tablet  Commonly known as:  SINGULAIR  Take 1 tablet (10 mg total) by mouth at bedtime. For Asthma   Indication:  Asthma     multivitamin with minerals Tabs  Take 1 tablet by mouth daily. For vitamin deficiency   Indication:  For vitamin deficiency     traZODone 50 MG tablet  Commonly known as:  DESYREL  Take 1 tablet (50 mg total) by mouth at bedtime. For depression/sleep   Indication:  Trouble Sleeping, Major Depressive Disorder     triamcinolone 55 MCG/ACT nasal inhaler  Commonly known as:  NASACORT  Place 2 sprays into the nose daily. For allergies   Indication:  Hayfever     venlafaxine XR 75 MG 24 hr capsule  Commonly known as:  EFFEXOR-XR  Take 1 capsule (75 mg total) by mouth daily before breakfast. For depression   Indication:  Major Depressive Disorder       Follow-up Information   Follow up with [facility] On 03/02/2013. (Follow up with Dr Dellia Cloud at 4 PM on 03/02/2013)    Contact information:   [address] [place], [place]  [address] Ph [phone] Fax [phone]       Follow up with [facility]  On 03/07/2013. (Followup appointment at 3:20 PM on Tuesday 3/18 )  Contact information:   [address] [place], [place] [address] [facility] [phone] Fax [phone]      Follow-up recommendations:  Activity:  As tolerated Other: Diet as recommended by your doctor.  Keep all scheduled follow-up appointments as recommended.  Comments:  Take all your medications as prescribed by your mental healthcare provider. Report any adverse effects and or reactions from your medicines to your outpatient provider promptly. Patient is instructed and cautioned to not engage in alcohol and or illegal drug use while on prescription medicines. In the event of worsening symptoms, patient is instructed to call the crisis hotline, 911 and or go to the nearest ED for appropriate evaluation and treatment of symptoms. Follow-up with your primary care provider for your other medical issues, concerns and or health care needs.   Continue to work the relapse prevention plan Total Discharge Time:  Greater than 30 minutes  Signed: Sanjuana Kava, FNP-BC, PMHNP-BC 02/23/2013, 9:00 AM

## 2013-02-23 NOTE — Progress Notes (Signed)
[facility] Adult Case Management Discharge Plan :  Will you be returning to the same living situation after discharge: Yes,  home with husband At discharge, do you have transportation home?:Yes,  family Do you have the ability to pay for your medications:Yes,  insurance helps  Release of information consent forms completed and in the chart;  Patient's signature needed at discharge.  Patient to Follow up at: Follow-up Information   Follow up with [facility] On 03/02/2013. (Follow up with Dr Dellia Cloud at 4 PM on 03/02/2013)    Contact information:   [address] [place], [place]  [address] Ph [phone] Fax [phone]      Follow up with [facility]  On 03/07/2013. (Followup appointment at 3:20 PM on Tuesday 3/18 )    Contact information:   [address] [place], [place] [address] [facility] [phone] Fax [phone]      Follow up with Dr Gershon Crane On 02/24/2013. (Appointment with Dr Clent Ridges at 11:15 AM on Friday 3.7.14)    Contact information:   [address] [place], [place]  [address] [facility] [phone]      Patient denies SI/HI:   Yes,  denies both    Safety Planning and Suicide Prevention discussed:  Yes,  in morning discharge planning group on 3.3.14  Clide Dales 02/23/2013, 11:32 AM

## 2013-02-23 NOTE — Progress Notes (Signed)
Adult Psychoeducational Group Note  Date:  02/23/2013 Time:  3:23 PM  Group Topic/Focus:  Overcoming Stress:   The focus of this group is to define stress and help patients assess their triggers.  Participation Level:  Active  Participation Quality:  Appropriate, Attentive, Sharing and Supportive  Affect:  Appropriate  Cognitive:  Alert, Appropriate and Oriented  Insight: Good  Engagement in Group:  Engaged and Supportive  Modes of Intervention:  Education and Support  Additional Comments:  Pt actively participated in group discussion. Pt participated well in small group "stress interview." Pt expressed that her job is her primary stressor, as her position is hectic in nature. Pt expressed that she would cope with stress at work by finding a peaceful, quiet place to go when she feels her stress level rising beyond a manageable level.  Reinaldo Raddle K 02/23/2013, 3:23 PM

## 2013-02-24 ENCOUNTER — Ambulatory Visit: Payer: Self-pay | Admitting: Family Medicine

## 2013-02-28 ENCOUNTER — Ambulatory Visit (INDEPENDENT_AMBULATORY_CARE_PROVIDER_SITE_OTHER): Payer: 59 | Admitting: Family Medicine

## 2013-02-28 ENCOUNTER — Encounter: Payer: Self-pay | Admitting: Family Medicine

## 2013-02-28 VITALS — BP 110/60 | HR 119 | Temp 98.2°F | Wt 111.0 lb

## 2013-02-28 DIAGNOSIS — J45909 Unspecified asthma, uncomplicated: Secondary | ICD-10-CM

## 2013-02-28 DIAGNOSIS — F411 Generalized anxiety disorder: Secondary | ICD-10-CM

## 2013-02-28 DIAGNOSIS — E538 Deficiency of other specified B group vitamins: Secondary | ICD-10-CM

## 2013-02-28 DIAGNOSIS — F329 Major depressive disorder, single episode, unspecified: Secondary | ICD-10-CM

## 2013-02-28 DIAGNOSIS — I10 Essential (primary) hypertension: Secondary | ICD-10-CM

## 2013-02-28 DIAGNOSIS — F1020 Alcohol dependence, uncomplicated: Secondary | ICD-10-CM

## 2013-02-28 LAB — HEPATIC FUNCTION PANEL
Albumin: 3.5 g/dL (ref 3.5–5.2)
Alkaline Phosphatase: 113 U/L (ref 39–117)
Total Bilirubin: 0.5 mg/dL (ref 0.3–1.2)

## 2013-02-28 LAB — BASIC METABOLIC PANEL
Calcium: 9.1 mg/dL (ref 8.4–10.5)
GFR: 155.91 mL/min (ref >60.00)
Glucose, Bld: 69 mg/dL — ABNORMAL LOW (ref 70–99)
Potassium: 4.5 mEq/L (ref 3.5–5.1)
Sodium: 136 mEq/L (ref 135–145)

## 2013-02-28 LAB — VITAMIN B12: Vitamin B-12: 489 pg/mL (ref 211–911)

## 2013-02-28 MED ORDER — ALBUTEROL SULFATE HFA 108 (90 BASE) MCG/ACT IN AERS: 2.0000 | INHALATION_SPRAY | RESPIRATORY_TRACT | Status: DC | PRN

## 2013-02-28 MED ORDER — BECLOMETHASONE DIPROPIONATE 40 MCG/ACT IN AERS: 2.0000 | INHALATION_SPRAY | Freq: Two times a day (BID) | RESPIRATORY_TRACT | Status: DC

## 2013-02-28 MED ORDER — FLUTICASONE PROPIONATE 50 MCG/ACT NA SUSP: 2.0000 | Freq: Every day | NASAL | Status: DC

## 2013-02-28 MED ORDER — DICYCLOMINE HCL 20 MG PO TABS: 20.0000 mg | ORAL_TABLET | Freq: Three times a day (TID) | ORAL | Status: DC | PRN

## 2013-02-28 NOTE — Progress Notes (Signed)
  Subjective:    Patient ID: Kelly Zuniga, female    DOB: [birth year], 47 y.o.   MRN: [id number]  HPI Here to follow up on a hospital stay from 02-17-13 to 02-23-13 for alcohol detox. She was treated with Librium and did quite well. Now that she has been out she no longer has any tremors and feels much better. She is on Effexor and trazodone. She admits to drinking one glass of wince last night but denies any other alcohol consumption. She is attending AA meetings. She is scheduled to see Dr. Jennelle Human on 03-07-13 and to see Dr. Dellia Cloud on 03-02-13. She had a fair amount of leg edema in the hospital and was treated with Lasix. Since she has been home the swelling has totally resolved. No SOB. Her renal function was normal but her liver enzymes were elevated. Her B12 was at the low end of normal and she was given 3 shots of B12 in the hospital. She says this greatly improved her energy level. She has good relief of abdominal cramps with bentyl and would like a refill on this.    Review of Systems  Constitutional: Positive for fatigue. Negative for activity change, appetite change and unexpected weight change.  Respiratory: Negative.   Endocrine: Negative.   Genitourinary: Negative.   Neurological: Negative.        Objective:   Physical Exam  Constitutional: She is oriented to person, place, and time. She appears well-developed and well-nourished.  Neck: No thyromegaly present.  Cardiovascular: Normal rate, regular rhythm, normal heart sounds and intact distal pulses.   Pulmonary/Chest: Effort normal and breath sounds normal.  Abdominal: Soft. Bowel sounds are normal. She exhibits no distension and no mass. There is no tenderness. There is no rebound and no guarding.  Musculoskeletal: She exhibits no edema.  Lymphadenopathy:    She has no cervical adenopathy.  Neurological: She is alert and oriented to person, place, and time.          Assessment & Plan:  She is doing well after detox for  alcohol. I urged her to refrain from any alcohol use. We will get some labs today. Refilled Bentyl.

## 2013-02-28 NOTE — Progress Notes (Signed)
Patient Discharge Instructions:  After Visit Summary (AVS):   Faxed to:  02/28/13 Discharge Summary Note:   Faxed to:  02/28/13 Psychiatric Admission Assessment Note:   Faxed to:  02/28/13 Suicide Risk Assessment - Discharge Assessment:   Faxed to:  02/28/13 Faxed/Sent to the Next Level Care provider:  02/28/13 Next Level Care Provider Has Access to the EMR, 02/28/13 Faxed to [facility] @ [phone] Records provided to [facility] via [facility]/Epic access Records provided to [facility] [place]/Dr. Gershon Crane via CH[facility]/Epic access  [name], 02/28/2013, 1:13 PM

## 2013-03-01 NOTE — Progress Notes (Signed)
Quick Note:  I left voice message with results. ______ 

## 2013-03-02 ENCOUNTER — Ambulatory Visit (INDEPENDENT_AMBULATORY_CARE_PROVIDER_SITE_OTHER): Payer: 59 | Admitting: Psychology

## 2013-03-02 DIAGNOSIS — F411 Generalized anxiety disorder: Secondary | ICD-10-CM

## 2013-03-20 ENCOUNTER — Ambulatory Visit (INDEPENDENT_AMBULATORY_CARE_PROVIDER_SITE_OTHER): Payer: 59 | Admitting: Psychology

## 2013-03-20 DIAGNOSIS — F411 Generalized anxiety disorder: Secondary | ICD-10-CM

## 2013-03-29 ENCOUNTER — Ambulatory Visit (INDEPENDENT_AMBULATORY_CARE_PROVIDER_SITE_OTHER): Payer: 59 | Admitting: Psychology

## 2013-03-29 DIAGNOSIS — F411 Generalized anxiety disorder: Secondary | ICD-10-CM

## 2013-04-05 ENCOUNTER — Ambulatory Visit (INDEPENDENT_AMBULATORY_CARE_PROVIDER_SITE_OTHER): Payer: 59 | Admitting: Psychology

## 2013-04-05 DIAGNOSIS — F411 Generalized anxiety disorder: Secondary | ICD-10-CM

## 2013-04-14 ENCOUNTER — Ambulatory Visit (INDEPENDENT_AMBULATORY_CARE_PROVIDER_SITE_OTHER): Payer: 59 | Admitting: Psychology

## 2013-04-14 DIAGNOSIS — F411 Generalized anxiety disorder: Secondary | ICD-10-CM

## 2013-05-08 ENCOUNTER — Ambulatory Visit (INDEPENDENT_AMBULATORY_CARE_PROVIDER_SITE_OTHER): Payer: 59 | Admitting: Psychology

## 2013-05-08 DIAGNOSIS — F411 Generalized anxiety disorder: Secondary | ICD-10-CM

## 2013-05-23 ENCOUNTER — Ambulatory Visit: Payer: 59 | Admitting: Psychology

## 2013-06-07 ENCOUNTER — Other Ambulatory Visit: Payer: Self-pay | Admitting: Family Medicine

## 2013-07-10 ENCOUNTER — Telehealth: Payer: Self-pay | Admitting: Family Medicine

## 2013-07-10 NOTE — Telephone Encounter (Signed)
Refill request for Fluticasone spray, Qvar inhaler & Montelukast and send to [facility].

## 2013-07-11 MED ORDER — BECLOMETHASONE DIPROPIONATE 40 MCG/ACT IN AERS: 2.0000 | INHALATION_SPRAY | Freq: Two times a day (BID) | RESPIRATORY_TRACT | Status: DC

## 2013-07-11 MED ORDER — FLUTICASONE PROPIONATE 50 MCG/ACT NA SUSP: 2.0000 | Freq: Every day | NASAL | Status: DC

## 2013-07-11 MED ORDER — MONTELUKAST SODIUM 10 MG PO TABS: 10.0000 mg | ORAL_TABLET | Freq: Every day | ORAL | Status: DC

## 2013-07-11 NOTE — Telephone Encounter (Signed)
I sent all 3 scripts to [facility]

## 2013-08-31 ENCOUNTER — Other Ambulatory Visit: Payer: Self-pay | Admitting: Family Medicine

## 2013-08-31 NOTE — Telephone Encounter (Signed)
Pt needs new rx promethazine 25 mg call [facility]. Pt will be going to [place] for 2 months and needs rx by this Saturday. Pt call be reached at [phone]

## 2013-11-29 ENCOUNTER — Ambulatory Visit (INDEPENDENT_AMBULATORY_CARE_PROVIDER_SITE_OTHER)
Admission: RE | Admit: 2013-11-29 | Discharge: 2013-11-29 | Disposition: A | Discharge status: Home / Self Care | Payer: BC Managed Care – PPO | Source: Ambulatory Visit | Attending: Family Medicine | Admitting: Family Medicine

## 2013-11-29 ENCOUNTER — Telehealth: Payer: Self-pay | Admitting: Family Medicine

## 2013-11-29 ENCOUNTER — Ambulatory Visit (INDEPENDENT_AMBULATORY_CARE_PROVIDER_SITE_OTHER): Payer: BC Managed Care – PPO | Admitting: Family Medicine

## 2013-11-29 ENCOUNTER — Encounter: Payer: Self-pay | Admitting: Family Medicine

## 2013-11-29 VITALS — BP 114/62 | HR 115 | Temp 98.5°F | Wt 104.0 lb

## 2013-11-29 DIAGNOSIS — S60222A Contusion of left hand, initial encounter: Secondary | ICD-10-CM

## 2013-11-29 DIAGNOSIS — S60229A Contusion of unspecified hand, initial encounter: Secondary | ICD-10-CM

## 2013-11-29 MED ORDER — AMOXICILLIN-POT CLAVULANATE 875-125 MG PO TABS: 1.0000 | ORAL_TABLET | Freq: Two times a day (BID) | ORAL | Status: DC

## 2013-11-29 NOTE — Telephone Encounter (Signed)
I spoke with pt and gave results.  
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
## 2013-11-29 NOTE — Telephone Encounter (Signed)
Pt would like a call about xray results

## 2013-11-29 NOTE — Progress Notes (Signed)
Pre visit review using our clinic review tool, if applicable. No additional management support is needed unless otherwise documented below in the visit note. 

## 2014-01-09 ENCOUNTER — Telehealth: Payer: Self-pay | Admitting: Family Medicine

## 2014-01-09 NOTE — Telephone Encounter (Signed)
Pt is not happy with current therapist, Dr. Caralyn Guileavid Gutterman or her psychiatrist, Dr. Maurice Marcharrie Cottle. Pt would like Dr. Clent RidgesFry to refer her to new ones.  Pt is more concerned about obtaining a new psychiatrist because they prescribes her med's.  Please advise.

## 2014-01-11 NOTE — Telephone Encounter (Signed)
I spoke with pt  

## 2014-01-11 NOTE — Telephone Encounter (Signed)
Actually no referral is needed. She can make her own appt with whomever she would like to see

## 2014-02-27 IMAGING — CR DG HAND COMPLETE 3+V*L*
3 series · 3 of 3 positions shown · non-contrast
Comparison: None.

CLINICAL DATA: Fall.  Left hand pain and swelling.

EXAM:
LEFT HAND - COMPLETE 3+ VIEW

[view not recorded (1 of 3)]
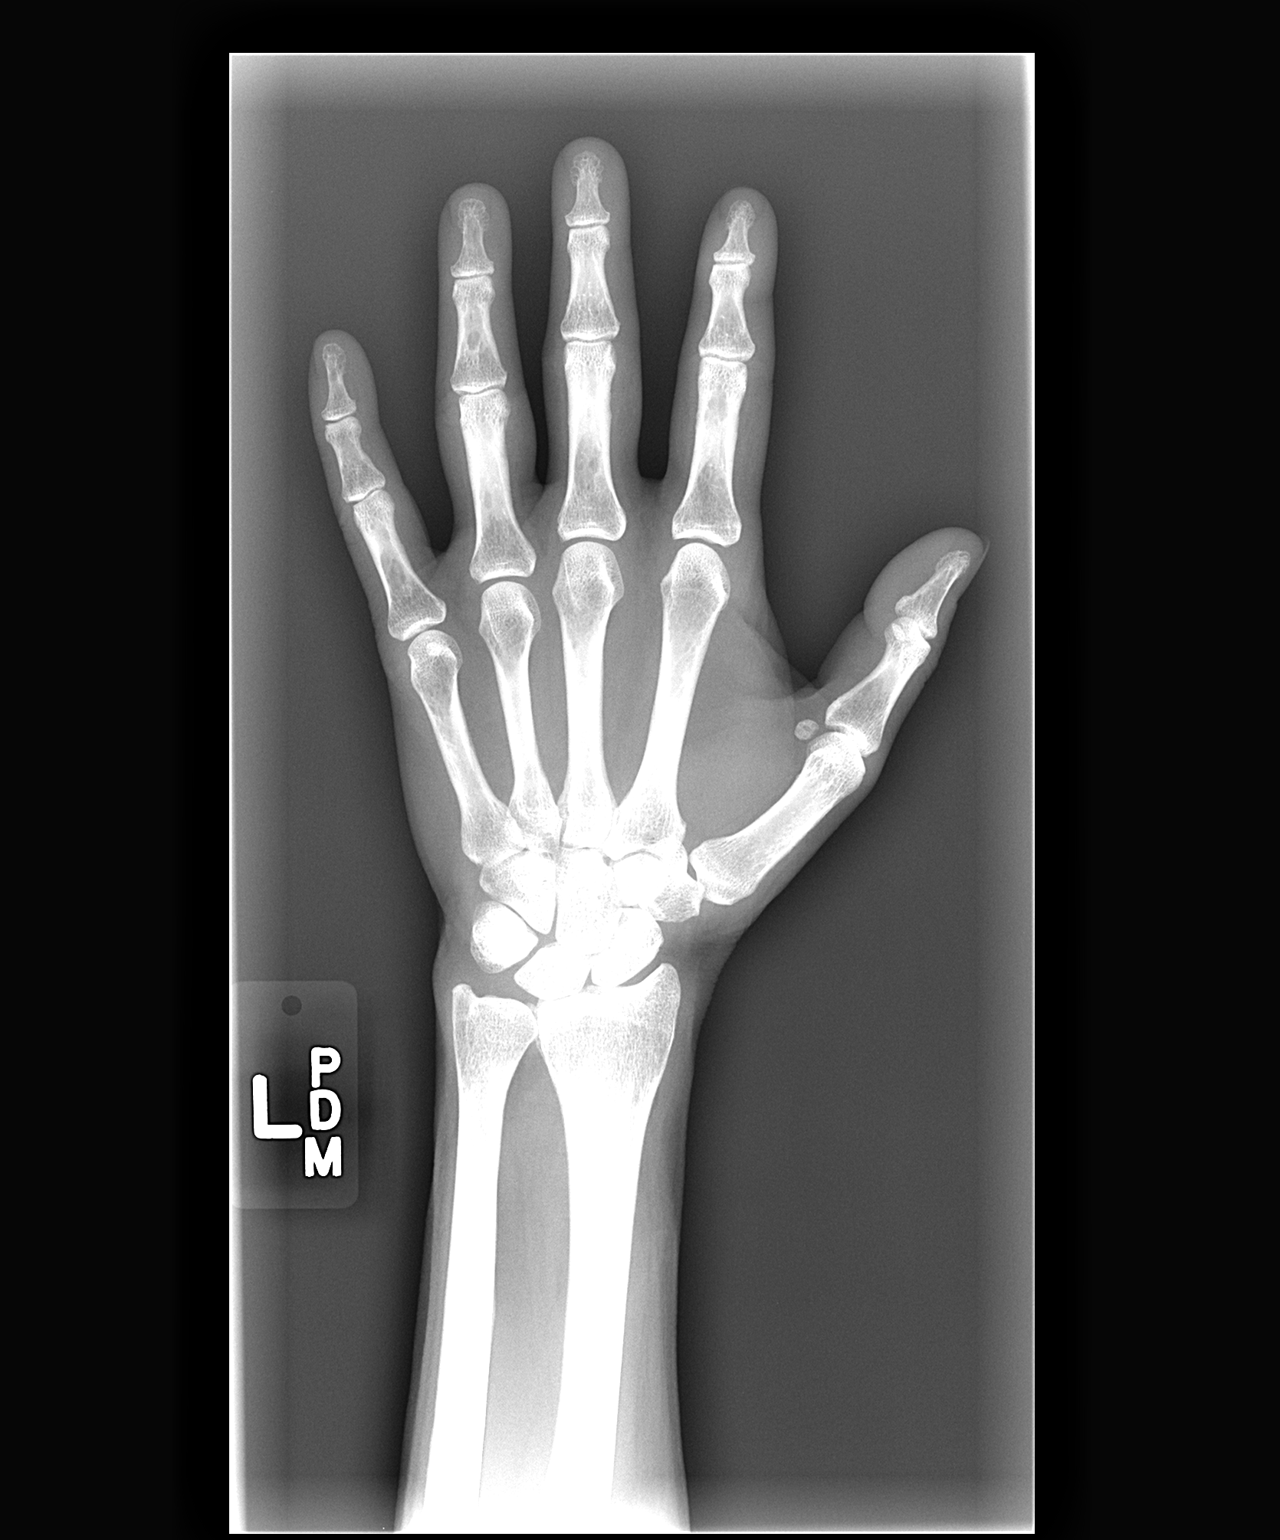

[view not recorded (2 of 3)]
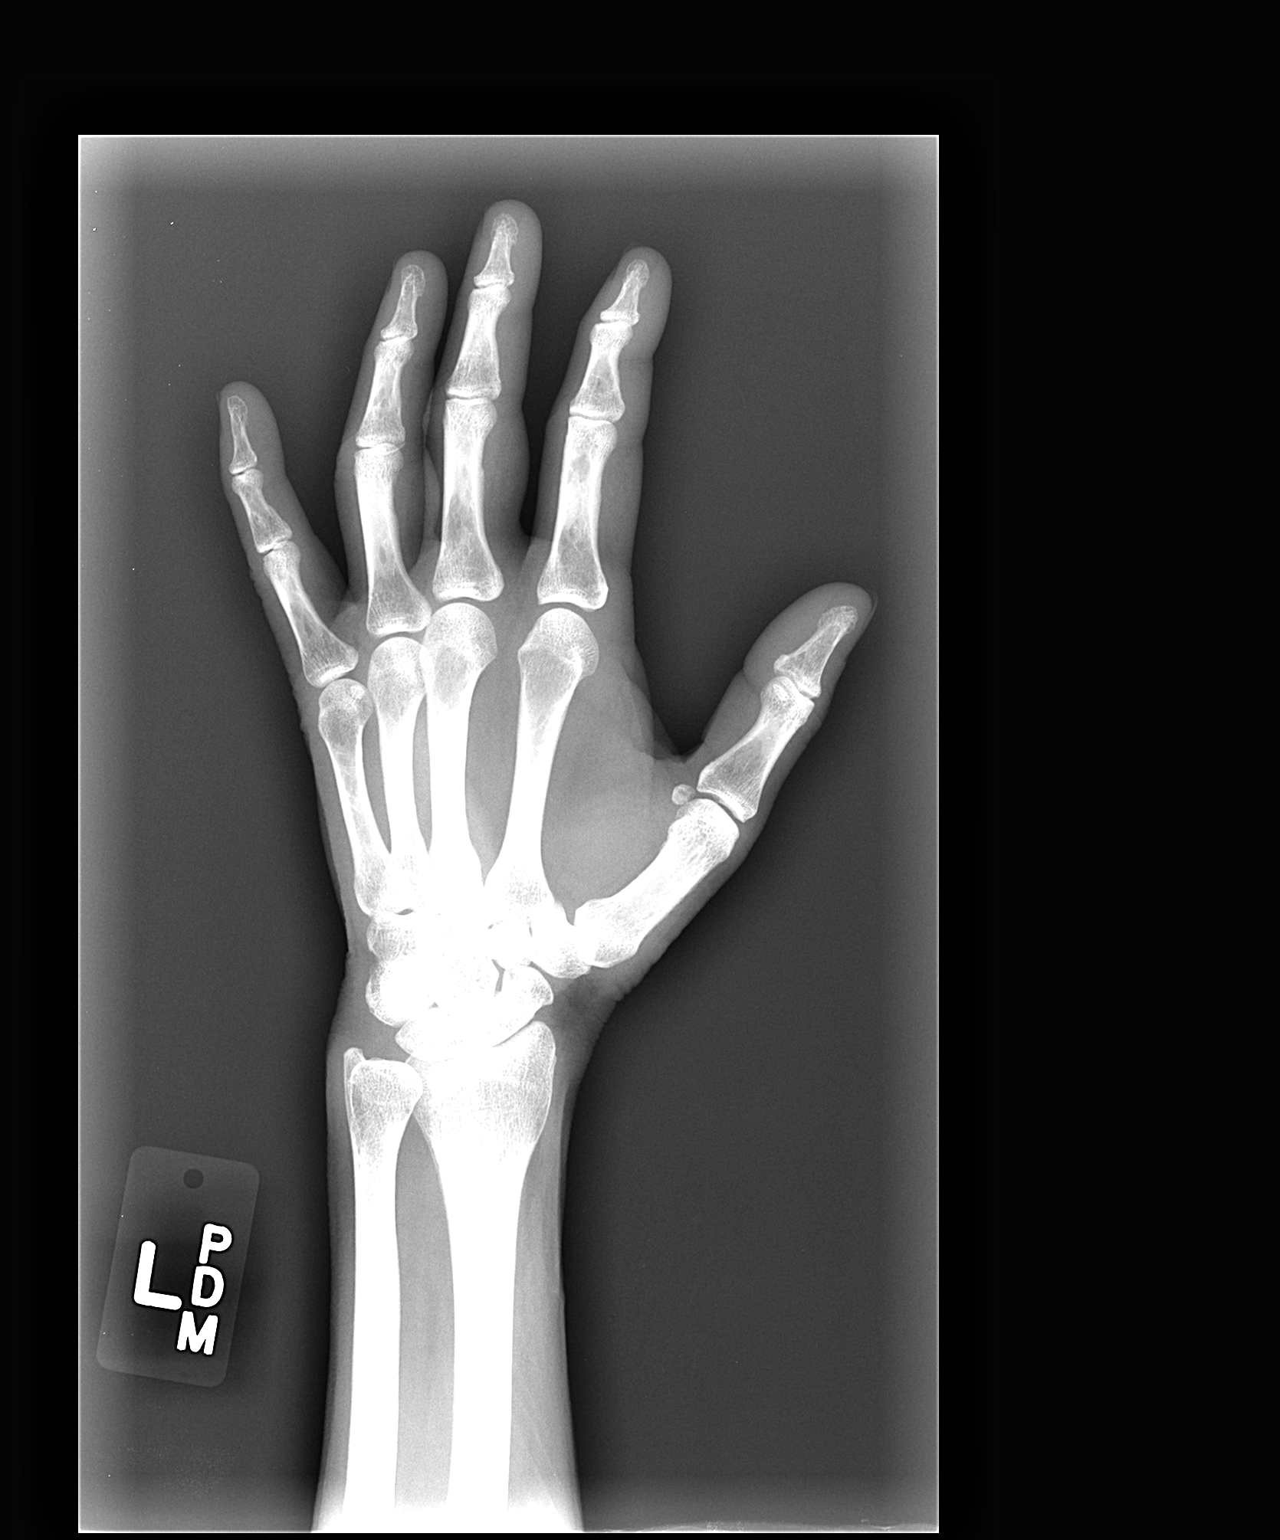

[view not recorded (3 of 3)]
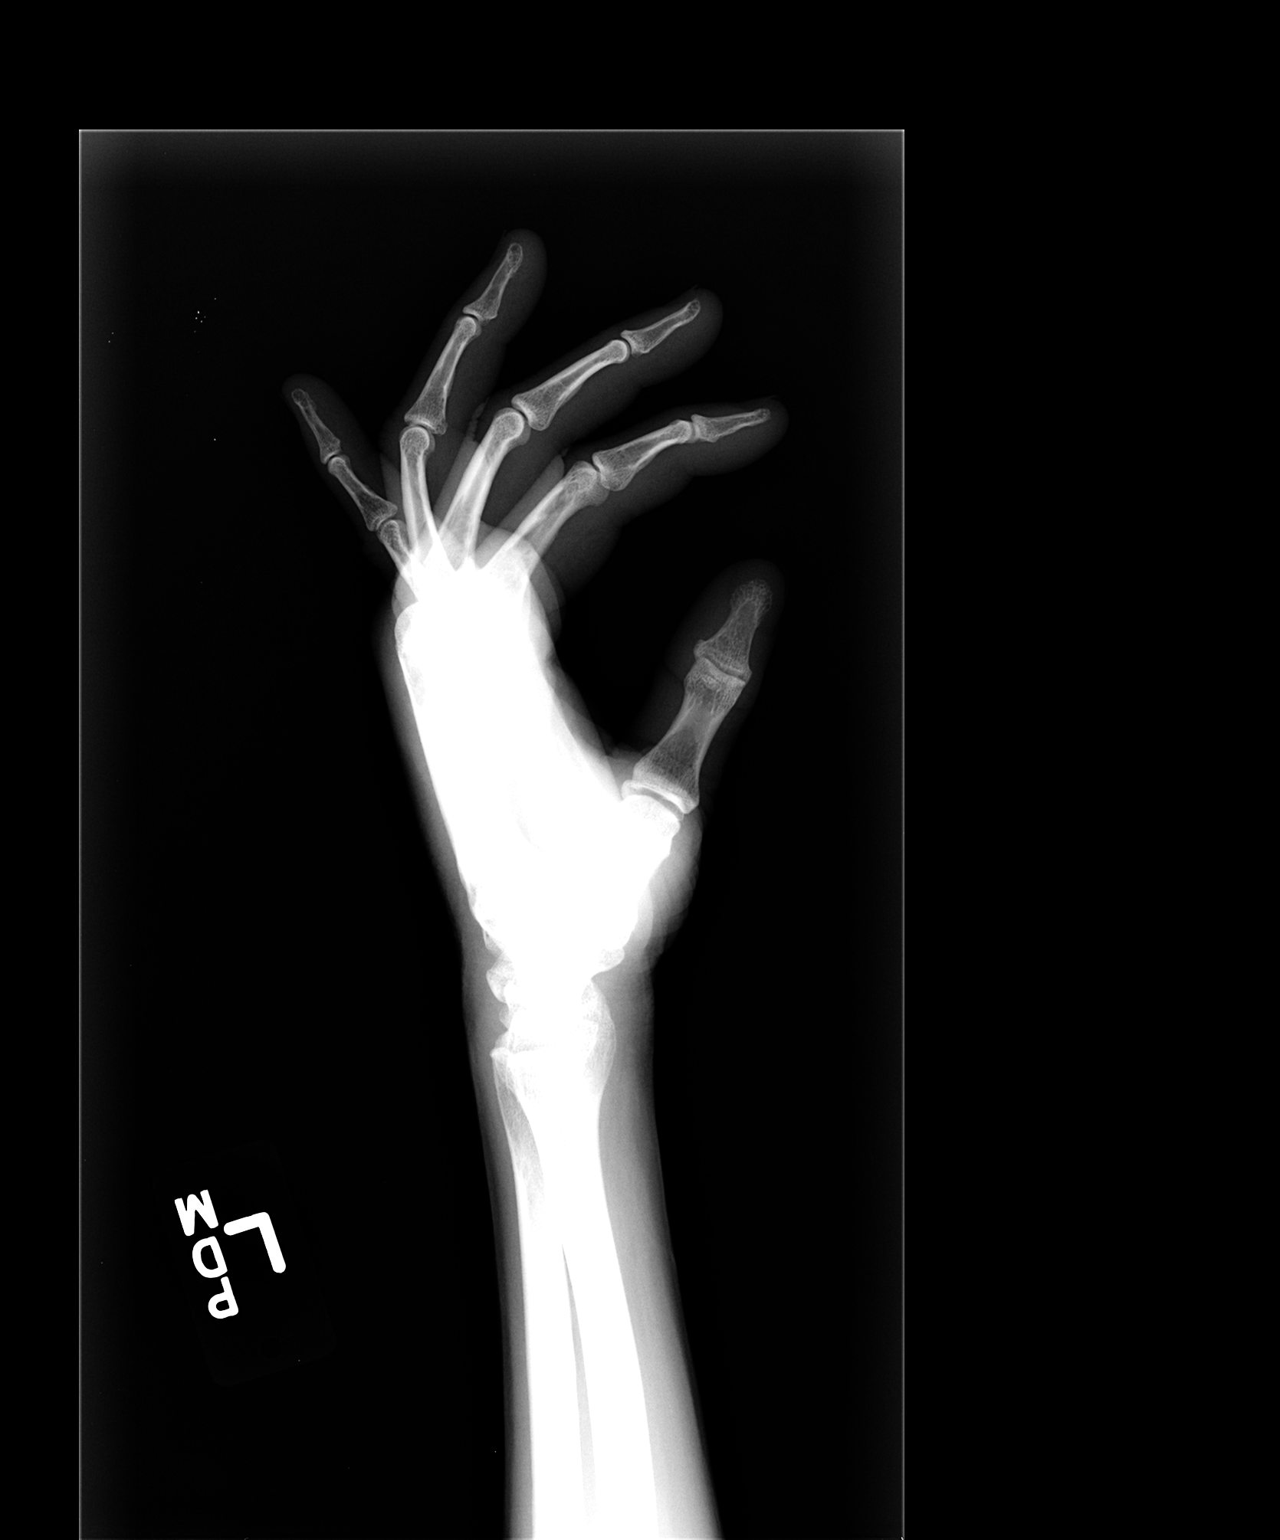

[3 of 3 positions shown; findings below may reference images not displayed]

FINDINGS: No acute bone or soft tissue abnormality is present. No significant
soft tissue swelling is evident. The wrist is intact.
IMPRESSION: Negative left hand radiographs.

## 2014-04-14 ENCOUNTER — Other Ambulatory Visit: Payer: Self-pay | Admitting: Family Medicine

## 2014-04-16 NOTE — Telephone Encounter (Signed)
Pt needs qvar and albuterol inhaler call [facility]. Pt is out

## 2014-04-18 ENCOUNTER — Telehealth: Payer: Self-pay | Admitting: Family Medicine

## 2014-04-18 NOTE — Telephone Encounter (Signed)
Noted  

## 2014-04-18 NOTE — Telephone Encounter (Signed)
Patient Information:  Caller Name: Kennith Centerracey  Phone: [phone]  Patient: Kelly Zuniga, Kelly Zuniga  Gender: Female  DOB: [birth year]  Age: 48 Years  PCP: Gershon CraneFry, [facility])  Pregnant: No  Office Follow Up:  Does the office need to follow up with this patient?: No  Instructions For The Office: N/A  RN Note:  Emergent s/sx ruled out per Vomiting Protocol . Home care advice and call back parameters reviewed. Understanding expressed.  Did not advise left over medication. Encouraged to call back for questions, changes or concerns.  Symptoms  Reason For Call & Symptoms: Patient states onset at 05:30 with vomiting.  Emesis x 3. No diarrhea. Temperature 99.8 (o). Sipping water.    Patient has left over phenergan should she take this?  What should she do?  Reviewed Health History In EMR: Yes  Reviewed Medications In EMR: Yes  Reviewed Allergies In EMR: Yes  Reviewed Surgeries / Procedures: Yes  Date of Onset of Symptoms: 04/18/2014  Any Fever: Yes  Fever Taken: Oral  Fever Time Of Reading: 07:45:00  Fever Last Reading: 99.8 OB / GYN:  LMP: 04/18/2014  Guideline(s) Used:  Vomiting  Disposition Per Guideline:   Home Care  Reason For Disposition Reached:   Vomiting  Advice Given:  Reassurance:  Vomiting can be caused by many types of illnesses. It can be caused by a stomach flu virus. It can be caused by eating or drinking something that disagreed with your stomach.  Adults with vomiting need to stay hydrated. This is the most important thing. If you don't drink and replace lost fluids, you may get dehydrated.  You can treat vomiting, even if there is mild dehydration, at home.  Here is some care advice that should help.  Clear Liquids:  Sip water or a rehydration drink (e.g., Gatorade or Powerade).  Other options: 1/2 strength flat lemon-lime soda or ginger ale.  After 4 hours without vomiting, increase the amount.  For Non-stop Vomiting, Try Sleeping:  Try to go to sleep  (Reason: sleep often empties the stomach and relieves the need to vomit).  When you awaken, resume drinking liquids. Water works best initially.  Expected Course:  Vomiting from viral gastritis usually stops in 12 to 48 hours.  If diarrhea is present, it usually lasts for several days.  People with mild dehydration can usually treat themselves at home, by drinking more liquids.  People with moderate to severe dehydration may need medical care. signs of this include very dry mouth, dizziness, weakness, and decreased urination.  Call Back If:  Vomiting lasts for more than 2 days (48 hours)  Signs of dehydration occur  You become worse.  Patient Will Follow Care Advice:  YES

## 2014-04-19 ENCOUNTER — Telehealth: Payer: Self-pay | Admitting: Family Medicine

## 2014-04-19 MED ORDER — FLUTICASONE PROPIONATE HFA 220 MCG/ACT IN AERO: 2.0000 | INHALATION_SPRAY | Freq: Two times a day (BID) | RESPIRATORY_TRACT | Status: DC

## 2014-04-19 NOTE — Telephone Encounter (Signed)
I sent new script e-scribe and left a voice message with the below information. 

## 2014-04-19 NOTE — Telephone Encounter (Signed)
[employer] [place] denied QVAR. PT must try and fail Asmanex and Flovent first.

## 2014-04-19 NOTE — Telephone Encounter (Signed)
Okay, switch to Flovent HFA 220 mcg to use 2 puffs bid. Call in one year supply

## 2020-02-24 ENCOUNTER — Encounter (HOSPITAL_COMMUNITY): Payer: Self-pay

## 2020-02-24 ENCOUNTER — Ambulatory Visit (HOSPITAL_COMMUNITY)
Admission: EM | Admit: 2020-02-24 | Discharge: 2020-02-24 | Disposition: A | Discharge status: Home / Self Care | Payer: PRIVATE HEALTH INSURANCE

## 2020-02-24 ENCOUNTER — Other Ambulatory Visit: Payer: Self-pay

## 2020-02-24 DIAGNOSIS — X101XXA Contact with hot food, initial encounter: Secondary | ICD-10-CM

## 2020-02-24 DIAGNOSIS — T23202A Burn of second degree of left hand, unspecified site, initial encounter: Secondary | ICD-10-CM

## 2020-02-24 DIAGNOSIS — M79642 Pain in left hand: Secondary | ICD-10-CM

## 2020-02-24 DIAGNOSIS — T300 Burn of unspecified body region, unspecified degree: Secondary | ICD-10-CM

## 2020-02-24 MED ORDER — SILVER SULFADIAZINE 1 % EX CREA: 1.0000 "application " | TOPICAL_CREAM | Freq: Every day | CUTANEOUS | 0 refills | Status: DC
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
## 2020-02-24 NOTE — Discharge Instructions (Addendum)
Keep area dry when uncovered.  Keep covered and bandaged with silvadene, change bandage at least once per day and if soiled  Follow up if noting redness, swelling, heat to the area, fever, as needed.
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
## 2020-02-25 ENCOUNTER — Ambulatory Visit (HOSPITAL_COMMUNITY)
Admission: EM | Admit: 2020-02-25 | Discharge: 2020-02-25 | Disposition: A | Discharge status: Home / Self Care | Payer: PRIVATE HEALTH INSURANCE | Attending: Family Medicine | Admitting: Family Medicine

## 2020-02-25 ENCOUNTER — Other Ambulatory Visit: Payer: Self-pay

## 2020-02-25 ENCOUNTER — Encounter (HOSPITAL_COMMUNITY): Payer: Self-pay | Admitting: Emergency Medicine

## 2020-02-25 DIAGNOSIS — M79642 Pain in left hand: Secondary | ICD-10-CM

## 2020-02-25 DIAGNOSIS — T300 Burn of unspecified body region, unspecified degree: Secondary | ICD-10-CM

## 2020-02-25 DIAGNOSIS — Z5189 Encounter for other specified aftercare: Secondary | ICD-10-CM | POA: Diagnosis not present

## 2020-02-25 DIAGNOSIS — T23262D Burn of second degree of back of left hand, subsequent encounter: Secondary | ICD-10-CM

## 2020-02-25 DIAGNOSIS — X101XXD Contact with hot food, subsequent encounter: Secondary | ICD-10-CM

## 2020-02-25 DIAGNOSIS — T148XXA Other injury of unspecified body region, initial encounter: Secondary | ICD-10-CM

## 2020-02-25 MED ORDER — CEPHALEXIN 500 MG PO CAPS: 500.0000 mg | ORAL_CAPSULE | Freq: Two times a day (BID) | ORAL | 0 refills | Status: AC

## 2020-02-25 MED ORDER — KETOROLAC TROMETHAMINE 30 MG/ML IJ SOLN
INTRAMUSCULAR | Status: AC
  Filled 2020-02-25: qty 1

## 2020-02-25 MED ORDER — KETOROLAC TROMETHAMINE 30 MG/ML IJ SOLN
30.0000 mg | Freq: Once | INTRAMUSCULAR | Status: AC
  Administered 2020-02-25: 11:00:00 30 mg via INTRAMUSCULAR
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
## 2020-02-28 ENCOUNTER — Encounter (HOSPITAL_COMMUNITY): Payer: Self-pay | Admitting: Family Medicine

## 2020-02-28 ENCOUNTER — Other Ambulatory Visit: Payer: Self-pay

## 2020-02-28 ENCOUNTER — Ambulatory Visit (HOSPITAL_COMMUNITY)
Admission: EM | Admit: 2020-02-28 | Discharge: 2020-02-28 | Disposition: A | Discharge status: Home / Self Care | Payer: PRIVATE HEALTH INSURANCE

## 2020-02-28 DIAGNOSIS — X101XXD Contact with hot food, subsequent encounter: Secondary | ICD-10-CM | POA: Diagnosis not present

## 2020-02-28 DIAGNOSIS — T23092D Burn of unspecified degree of multiple sites of left wrist and hand, subsequent encounter: Secondary | ICD-10-CM | POA: Diagnosis not present

## 2020-02-28 DIAGNOSIS — T300 Burn of unspecified body region, unspecified degree: Secondary | ICD-10-CM
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
## 2020-07-22 ENCOUNTER — Other Ambulatory Visit: Payer: Self-pay

## 2020-07-22 ENCOUNTER — Ambulatory Visit (INDEPENDENT_AMBULATORY_CARE_PROVIDER_SITE_OTHER): Payer: Self-pay | Admitting: Adult Health

## 2020-07-22 ENCOUNTER — Encounter: Payer: Self-pay | Admitting: Adult Health

## 2020-07-22 VITALS — BP 129/74 | HR 92 | Ht 60.0 in | Wt 98.0 lb

## 2020-07-22 DIAGNOSIS — F331 Major depressive disorder, recurrent, moderate: Secondary | ICD-10-CM

## 2020-07-22 DIAGNOSIS — G4700 Insomnia, unspecified: Secondary | ICD-10-CM

## 2020-07-22 DIAGNOSIS — F411 Generalized anxiety disorder: Secondary | ICD-10-CM

## 2020-07-22 MED ORDER — ESCITALOPRAM OXALATE 10 MG PO TABS: ORAL_TABLET | ORAL | 2 refills | Status: DC

## 2020-07-22 NOTE — Progress Notes (Signed)
[facility] MD/PA/NP Initial Note  07/22/2020 1:03 PM Kelly Zuniga  MRN:  [id number]  Chief Complaint:   HPI:   Describes mood today as "not the best". Pleasant. Mood symptoms - reports depression, anxiety, and irritability. Symptoms present for what seems like "forever". Does not feel like current medication is working well for her. Tearful in the mornings. Worries about "everything". Stating "it's all I do". Has a difficult time letting things go. Stable interest and motivation. Taking medications as prescribed.  Energy levels "great". Active, has a regular exercise routine - walking 10,000 plus steps every day.  Enjoys some usual interests and activities. Single. Lives with SO of 6 years. Helping out with SO 2 children 11 and 12. No children of her own. Has a dog - rescue. Spending time with family. Appetite adequate. Weight loss -98 pounds. Has lost 110 pounds.  Sleeps varies - "really bad lately". May sleep good 3 days out of 30. Averages 5 hours. Focus and concentration stable. Completing tasks. Managing aspects of household. Works part time at [employer]. Day starts at 4am. Works 6 hours a day.  Denies SI or HI. Denies AH or VH.  Previous medication trials: Xanax, Zoloft  Visit Diagnosis: No diagnosis found.  Past Psychiatric History: Admitted in College - age 54 for suicidal thoughts. Also admitted once in her 60's when divorcing her second husband - drinking alcohol and suicidal thoughts. Has seen a therapist for 30 years.  Past Medical History:  Past Medical History:  Diagnosis Date   Alcohol abuse    Allergy    Anxiety    Asthma    Depression    posibly bipolar disorder. Sees Dr [name] for psychiatric care and Dr [name] for therapy   Headache(784.0)    last migraine 2004   Hypertension    never on meds     Past Surgical History:  Procedure Laterality Date   benign cyst     removed from left cheek   TONSILECTOMY, ADENOIDECTOMY, BILATERAL MYRINGOTOMY  AND TUBES      Family Psychiatric History: Father with mental health issues. Mother sees a therapist every few weeks.  Family History:  Family History  Problem Relation Age of Onset   Coronary artery disease Other        1st degree relative female <50   Depression Other        Family Hx   Diabetes Other        Family Hx 1st degree relative   Ovarian cancer Other        Family Hx     Social History:  Social History   Socioeconomic History   Marital status: Married    Spouse name: Not on file   Number of children: Not on file   Years of education: Not on file   Highest education level: Not on file  Occupational History   Not on file  Tobacco Use   Smoking status: Current Every Day Smoker    Packs/day: 0.50    Types: Cigarettes   Smokeless tobacco: Never Used   Tobacco comment: Using the E cigarette to help her quit & still smoking  Substance and Sexual Activity   Alcohol use: Yes    Comment: 1 glass of wine per day or less   Drug use: No   Sexual activity: Not on file  Other Topics Concern   Not on file  Social History Narrative   Not on file   Social Determinants of Health  Financial Resource Strain:    Difficulty of Paying Living Expenses:   Food Insecurity:    Worried About Programme researcher, broadcasting/film/video in the Last Year:    Barista in the Last Year:   Transportation Needs:    Freight forwarder (Medical):    Lack of Transportation (Non-Medical):   Physical Activity:    Days of Exercise per Week:    Minutes of Exercise per Session:   Stress:    Feeling of Stress :   Social Connections:    Frequency of Communication with Friends and Family:    Frequency of Social Gatherings with Friends and Family:    Attends Religious Services:    Active Member of Clubs or Organizations:    Attends Banker Meetings:    Marital Status:     Allergies:  Allergies  Allergen Reactions   Acetaminophen Other (See Comments)     Makes LFT gets elevated Causes liver problems Liver issues    Lithium Other (See Comments)    REACTION: no appetite unknown    Metabolic Disorder Labs: Lab Results  Component Value Date   HGBA1C 4.8 02/20/2013   MPG 91 02/20/2013   No results found for: PROLACTIN Lab Results  Component Value Date   CHOL 191 01/27/2011   TRIG 57.0 01/27/2011   HDL 66.00 01/27/2011   CHOLHDL 3 01/27/2011   VLDL 11.4 01/27/2011   LDLCALC 114 (H) 01/27/2011   Lab Results  Component Value Date   TSH 0.35 08/18/2011   TSH 1.89 01/27/2011    Therapeutic Level Labs: No results found for: LITHIUM No results found for: VALPROATE No components found for:  CBMZ  Current Medications: Current Outpatient Medications  Medication Sig Dispense Refill   albuterol (VENTOLIN HFA) 108 (90 Base) MCG/ACT inhaler Inhale into the lungs.     beclomethasone (QVAR) 40 MCG/ACT inhaler Inhale 2 puffs into the lungs 2 (two) times daily. For shortness of breath/Asthma 3 Inhaler 1   beclomethasone (QVAR) 40 MCG/ACT inhaler Inhale into the lungs.     clonazePAM (KLONOPIN) 0.5 MG tablet Take 0.5 mg by mouth 2 (two) times daily as needed for anxiety.     escitalopram (LEXAPRO) 10 MG tablet SMARTSIG:1 Tablet(s) By Mouth Daily     fluticasone (FLONASE) 50 MCG/ACT nasal spray Place 2 sprays into the nose daily. 48 g 1   fluticasone (FLOVENT HFA) 220 MCG/ACT inhaler Inhale 2 puffs into the lungs 2 (two) times daily. 1 Inhaler 11   folic acid (FOLVITE) 1 MG tablet Take 1 tablet (1 mg total) by mouth daily. (Can be purchased from over the counter) :For Folic acid deficiency 30 tablet 0   montelukast (SINGULAIR) 10 MG tablet Take 1 tablet (10 mg total) by mouth at bedtime. For Asthma 90 tablet 1   montelukast (SINGULAIR) 10 MG tablet Take 1 tablet (10 mg total) by mouth at bedtime. 90 tablet 1   Multiple Vitamin (MULTIVITAMIN WITH MINERALS) TABS Take 1 tablet by mouth daily. For vitamin deficiency     PROAIR  HFA 108 (90 BASE) MCG/ACT inhaler INHALE 2 PUFFS EVERY 4 HOURS AS NEEDED FOR WHEEZING OR SHORTNESS OF BREATH 8.5 each 1   QVAR 40 MCG/ACT inhaler INHALE 2 PUFFS INTO THE LUNGS 2 (TWO) TIMES DAILY. FOR SHORTNESS OF BREATH/ASTHMA 8.7 g 1   silver sulfADIAZINE (SILVADENE) 1 % cream Apply 1 application topically daily. 50 g 0   topiramate (TOPAMAX) 25 MG tablet Take 25 mg by mouth 2 (two)  times daily.     traZODone (DESYREL) 50 MG tablet Take 1 tablet (50 mg total) by mouth at bedtime. For depression/sleep 30 tablet 0   triamcinolone (NASACORT) 55 MCG/ACT nasal inhaler Place 2 sprays into the nose daily. For allergies 1 Inhaler 11   venlafaxine XR (EFFEXOR-XR) 75 MG 24 hr capsule Take 1 capsule (75 mg total) by mouth daily before breakfast. For depression 30 capsule 0   zolpidem (AMBIEN) 10 MG tablet Take 10 mg by mouth at bedtime as needed for sleep.     No current facility-administered medications for this visit.    Medication Side Effects: none  Orders placed this visit:  No orders of the defined types were placed in this encounter.   Psychiatric Specialty Exam:  Review of Systems  Musculoskeletal: Negative for gait problem.  Neurological: Negative for tremors.  Psychiatric/Behavioral:       Please refer to HPI    There were no vitals taken for this visit.There is no height or weight on file to calculate BMI.  General Appearance: Neat and Well Groomed  Eye Contact:  Good  Speech:  Clear and Coherent and Normal Rate  Volume:  Normal  Mood:  Anxious, Depressed and Irritable  Affect:  Appropriate and Congruent  Thought Process:  Coherent and Descriptions of Associations: Intact  Orientation:  Full (Time, Place, and Person)  Thought Content: Logical   Suicidal Thoughts:  No  Homicidal Thoughts:  No  Memory:  WNL  Judgement:  Good  Insight:  Good  Psychomotor Activity:  Normal  Concentration:  Concentration: Good  Recall:  Good  Fund of Knowledge: Good  Language: Good   Assets:  Communication Skills Desire for Improvement Financial Resources/Insurance Housing Intimacy Leisure Time Physical Health Resilience Social Support Talents/Skills Transportation Vocational/Educational  ADL's:  Intact  Cognition: WNL  Prognosis:  Good   Screenings: None  Receiving Psychotherapy: No   Treatment Plan/Recommendations:   Plan:  PDMP reviewed  1. Clonazepam 0.5mg  TID - usually takes once a day 2. Increase Lexapro 10mg  to 15mg  daily x 7 days, then 20mg  daily  RTC 4 weeks  Patient advised to contact office with any questions, adverse effects, or acute worsening in signs and symptoms.  Discussed potential benefits, risk, and side effects of benzodiazepines to include potential risk of tolerance and dependence, as well as possible drowsiness.  Advised patient not to drive if experiencing drowsiness and to take lowest possible effective dose to minimize risk of dependence and tolerance.    , NP

## 2020-08-19 ENCOUNTER — Encounter: Payer: Self-pay | Admitting: Adult Health

## 2020-08-19 ENCOUNTER — Ambulatory Visit (INDEPENDENT_AMBULATORY_CARE_PROVIDER_SITE_OTHER): Payer: Self-pay | Admitting: Adult Health

## 2020-08-19 ENCOUNTER — Other Ambulatory Visit: Payer: Self-pay

## 2020-08-19 DIAGNOSIS — F411 Generalized anxiety disorder: Secondary | ICD-10-CM

## 2020-08-19 DIAGNOSIS — G4700 Insomnia, unspecified: Secondary | ICD-10-CM

## 2020-08-19 DIAGNOSIS — F331 Major depressive disorder, recurrent, moderate: Secondary | ICD-10-CM

## 2020-08-19 MED ORDER — CLONAZEPAM 0.5 MG PO TABS: 0.5000 mg | ORAL_TABLET | Freq: Three times a day (TID) | ORAL | 2 refills | Status: DC | PRN

## 2020-08-19 NOTE — Progress Notes (Signed)
Kelly Zuniga [id number] 06/22/1966 54 y.o.  Subjective:   Patient ID:  Kelly Zuniga is a 54 y.o. (DOB April 07, 1966) female.  Chief Complaint: No chief complaint on file.   HPI Kelly Zuniga presents to the office today for follow-up of MDD, GAD, and insomnia.  Describes mood today as "ok". Pleasant. Mood symptoms - denies depression, anxiety, and irritability. Denies tearfulness. Decreased worry - "I'm a normal worrier now, not an excessive worrier". Is able to let things go - "delegating things in her mind". Stable interest and motivation. Taking medications as prescribed.  Energy levels stable. Active, has a regular exercise routine. Walks 10,000 plus steps every day.  Enjoys some usual interests and activities. In a relationship for 6 years. Lives with partner and his 2 children 20 and 2. Has a dog - rescue. Spending time with family. Appetite adequate. Weight loss - 1 pound - 97 pounds. Sleep has improved. Averages 5 to 6 hours a night. . Focus and concentration stable. Completing tasks. Managing aspects of household. Works part time at [employer]. Day starts at 4am. Works 6 hours a day.  Denies SI or HI. Denies AH or VH.  Previous medication trials: Xanax, Zoloft   Review of Systems:  Review of Systems  Musculoskeletal: Negative for gait problem.  Neurological: Negative for tremors.  Psychiatric/Behavioral:       Please refer to HPI    Medications: I have reviewed the patient's current medications.  Current Outpatient Medications  Medication Sig Dispense Refill  . albuterol (PROVENTIL) (2.5 MG/3ML) 0.083% nebulizer solution Inhale into the lungs.    . clonazePAM (KLONOPIN) 0.5 MG tablet Take 1 tablet (0.5 mg total) by mouth 3 (three) times daily as needed for anxiety. 90 tablet 2  . escitalopram (LEXAPRO) 10 MG tablet Take one and 1/2 tablets daily for 7 days, then take two tablets daily. 60 tablet 2  . fluticasone (FLONASE) 50 MCG/ACT nasal spray Place 2 sprays into the  nose daily. 48 g 1  . folic acid (FOLVITE) 1 MG tablet Take 1 tablet (1 mg total) by mouth daily. (Can be purchased from over the counter) :For Folic acid deficiency 30 tablet 0  . Multiple Vitamin (MULTIVITAMIN WITH MINERALS) TABS Take 1 tablet by mouth daily. For vitamin deficiency    . topiramate (TOPAMAX) 25 MG tablet Take 25 mg by mouth 2 (two) times daily.    Marland Kitchen triamcinolone (NASACORT) 55 MCG/ACT nasal inhaler Place 2 sprays into the nose daily. For allergies 1 Inhaler 11  . Vitamin D, Ergocalciferol, (DRISDOL) 1.25 MG (50000 UNIT) CAPS capsule Take 50,000 Units by mouth every 7 (seven) days.     No current facility-administered medications for this visit.    Medication Side Effects: None  Allergies:  Allergies  Allergen Reactions  . Acetaminophen Other (See Comments)    Makes LFT gets elevated Causes liver problems Liver issues   . Lithium Other (See Comments)    REACTION: no appetite unknown    Past Medical History:  Diagnosis Date  . Alcohol abuse   . Allergy   . Anxiety   . Asthma   . Depression    posibly bipolar disorder. Sees Dr [name] for psychiatric care and Dr [name] for therapy  . Headache(784.0)    last migraine 2004  . Hypertension    never on meds     Family History  Problem Relation Age of Onset  . Coronary artery disease Other        1st degree relative  female <50  . Depression Other        Family Hx  . Diabetes Other        Family Hx 1st degree relative  . Ovarian cancer Other        Family Hx     Social History   Socioeconomic History  . Marital status: Married    Spouse name: Not on file  . Number of children: Not on file  . Years of education: Not on file  . Highest education level: Not on file  Occupational History  . Not on file  Tobacco Use  . Smoking status: Current Every Day Smoker    Packs/day: 0.50    Types: Cigarettes  . Smokeless tobacco: Never Used  . Tobacco comment: Using the E cigarette to help her  quit & still smoking  Substance and Sexual Activity  . Alcohol use: Yes    Comment: 1 glass of wine per day or less  . Drug use: No  . Sexual activity: Not on file  Other Topics Concern  . Not on file  Social History Narrative  . Not on file   Social Determinants of Health   Financial Resource Strain:   . Difficulty of Paying Living Expenses: Not on file  Food Insecurity:   . Worried About Programme researcher, broadcasting/film/video in the Last Year: Not on file  . Ran Out of Food in the Last Year: Not on file  Transportation Needs:   . Lack of Transportation (Medical): Not on file  . Lack of Transportation (Non-Medical): Not on file  Physical Activity:   . Days of Exercise per Week: Not on file  . Minutes of Exercise per Session: Not on file  Stress:   . Feeling of Stress : Not on file  Social Connections:   . Frequency of Communication with Friends and Family: Not on file  . Frequency of Social Gatherings with Friends and Family: Not on file  . Attends Religious Services: Not on file  . Active Member of Clubs or Organizations: Not on file  . Attends Banker Meetings: Not on file  . Marital Status: Not on file  Intimate Partner Violence:   . Fear of Current or Ex-Partner: Not on file  . Emotionally Abused: Not on file  . Physically Abused: Not on file  . Sexually Abused: Not on file    Past Medical History, Surgical history, Social history, and Family history were reviewed and updated as appropriate.   Please see review of systems for further details on the patient's review from today.   Objective:   Physical Exam:  There were no vitals taken for this visit.  Physical Exam Constitutional:      General: She is not in acute distress. Musculoskeletal:        General: No deformity.  Neurological:     Mental Status: She is alert and oriented to person, place, and time.     Coordination: Coordination normal.  Psychiatric:        Attention and Perception: Attention and  perception normal. She does not perceive auditory or visual hallucinations.        Mood and Affect: Mood normal. Mood is not anxious or depressed. Affect is not labile, blunt, angry or inappropriate.        Speech: Speech normal.        Behavior: Behavior normal.        Thought Content: Thought content normal. Thought content is not paranoid or  delusional. Thought content does not include homicidal or suicidal ideation. Thought content does not include homicidal or suicidal plan.        Cognition and Memory: Cognition and memory normal.        Judgment: Judgment normal.     Comments: Insight intact     Lab Review:     Component Value Date/Time   NA 136 02/28/2013 1223   K 4.5 02/28/2013 1223   CL 103 02/28/2013 1223   CO2 20 02/28/2013 1223   GLUCOSE 69 (L) 02/28/2013 1223   BUN 4 (L) 02/28/2013 1223   CREATININE 0.5 02/28/2013 1223   CALCIUM 9.1 02/28/2013 1223   PROT 7.0 02/28/2013 1223   ALBUMIN 3.5 02/28/2013 1223   AST 76 (H) 02/28/2013 1223   ALT 68 (H) 02/28/2013 1223   ALKPHOS 113 02/28/2013 1223   BILITOT 0.5 02/28/2013 1223   GFRNONAA >90 02/16/2013 1507   GFRAA >90 02/16/2013 1507       Component Value Date/Time   WBC 7.0 02/16/2013 1507   RBC 3.51 (L) 02/16/2013 1507   HGB 13.3 02/16/2013 1507   HCT 38.8 02/16/2013 1507   PLT 234 02/16/2013 1507   MCV 110.5 (H) 02/16/2013 1507   MCH 37.9 (H) 02/16/2013 1507   MCHC 34.3 02/16/2013 1507   RDW 13.6 02/16/2013 1507   LYMPHSABS 1.5 02/16/2013 1507   MONOABS 0.7 02/16/2013 1507   EOSABS 0.3 02/16/2013 1507   BASOSABS 0.1 02/16/2013 1507    No results found for: POCLITH, LITHIUM   No results found for: PHENYTOIN, PHENOBARB, VALPROATE, CBMZ   .res Assessment: Plan:     Plan:  PDMP reviewed  1. Clonazepam 0.5mg  TID - usually takes one tablet daily  2. Continue Lexapro 15mg  daily   RTC 4 weeks  Patient advised to contact office with any questions, adverse effects, or acute worsening in signs and  symptoms.  Discussed potential benefits, risk, and side effects of benzodiazepines to include potential risk of tolerance and dependence, as well as possible drowsiness.  Advised patient not to drive if experiencing drowsiness and to take lowest possible effective dose to minimize risk of dependence and tolerance.  Diagnoses and all orders for this visit:  Insomnia, unspecified type  Major depressive disorder, recurrent episode, moderate (HCC)  Generalized anxiety disorder  Other orders -     clonazePAM (KLONOPIN) 0.5 MG tablet; Take 1 tablet (0.5 mg total) by mouth 3 (three) times daily as needed for anxiety.     Please see After Visit Summary for patient specific instructions.  No future appointments.  No orders of the defined types were placed in this encounter.   -------------------------------

## 2020-08-26 ENCOUNTER — Other Ambulatory Visit: Payer: Self-pay

## 2020-08-26 ENCOUNTER — Emergency Department (HOSPITAL_COMMUNITY): Payer: No Typology Code available for payment source

## 2020-08-26 ENCOUNTER — Encounter (HOSPITAL_COMMUNITY): Payer: Self-pay

## 2020-08-26 ENCOUNTER — Emergency Department (HOSPITAL_COMMUNITY)
Admission: EM | Admit: 2020-08-26 | Discharge: 2020-08-26 | Disposition: A | Discharge status: Home / Self Care | Payer: No Typology Code available for payment source | Attending: Emergency Medicine | Admitting: Emergency Medicine

## 2020-08-26 DIAGNOSIS — Z79899 Other long term (current) drug therapy: Secondary | ICD-10-CM | POA: Insufficient documentation

## 2020-08-26 DIAGNOSIS — F17210 Nicotine dependence, cigarettes, uncomplicated: Secondary | ICD-10-CM | POA: Diagnosis not present

## 2020-08-26 DIAGNOSIS — S0990XA Unspecified injury of head, initial encounter: Secondary | ICD-10-CM | POA: Insufficient documentation

## 2020-08-26 DIAGNOSIS — J45909 Unspecified asthma, uncomplicated: Secondary | ICD-10-CM | POA: Insufficient documentation

## 2020-08-26 DIAGNOSIS — W108XXA Fall (on) (from) other stairs and steps, initial encounter: Secondary | ICD-10-CM | POA: Diagnosis not present

## 2020-08-26 DIAGNOSIS — S3992XA Unspecified injury of lower back, initial encounter: Secondary | ICD-10-CM | POA: Diagnosis not present

## 2020-08-26 DIAGNOSIS — Y999 Unspecified external cause status: Secondary | ICD-10-CM | POA: Diagnosis not present

## 2020-08-26 DIAGNOSIS — Y939 Activity, unspecified: Secondary | ICD-10-CM | POA: Insufficient documentation

## 2020-08-26 DIAGNOSIS — Y929 Unspecified place or not applicable: Secondary | ICD-10-CM | POA: Insufficient documentation

## 2020-08-26 DIAGNOSIS — I10 Essential (primary) hypertension: Secondary | ICD-10-CM | POA: Diagnosis not present

## 2020-08-26 DIAGNOSIS — W19XXXA Unspecified fall, initial encounter: Secondary | ICD-10-CM

## 2020-08-26 NOTE — Discharge Instructions (Addendum)
No serious injuries were found, from the fall.  You will likely be very sore for couple of days.  Use ice on sore spots 3 times a day for 2 days after that heat.  For pain, use Tylenol.  See your doctor if you are not better in 3 to 4 days.  Return here if needed.

## 2020-08-26 NOTE — ED Triage Notes (Addendum)
Per EMS- Patient is from home. Patient went outside to smoke this AM and fell down 2 steps, hitting her head on brick. Patient does not remember if she had LOC or not. Patient has a hematoma to the left occipital area. Patient also c/o upper thoracic pain also. C-collar placed. Patient was given zofran 4 mg IV PTA. Patient smells of alcohol.   In triage- room air sats 84%. Placed on O2 2Lmin via . Sats increased to 97%.

## 2020-08-26 NOTE — ED Provider Notes (Signed)
[facility]-EMERGENCY DEPT Provider Note   CSN: [id number] Arrival date & time: 08/26/20  5056     History Chief Complaint  Patient presents with  . Fall  . Head Injury    Kelly Zuniga is a 54 y.o. female.  HPI She presents for evaluation of injury to head.  She stepped out of her home, on the back porch, "to smoke a cigarette."  At some point, the patient tumbled off the porch onto a brick patio where she summoned help by crying out.  EMS found her there and transferred her here for evaluation.  Patient has injury to her posterior scalp, and upper back per EMS.  She did not ambulate after the fall.  She does not take anticoagulants.  During transport she was noted to have an oxygen saturation of 88% so was placed on nasal cannula oxygen with improvement to normal oxygen saturation.  Patient denies recent illnesses including fever, change in cough cough, change in shortness of breath, chest pain, weakness or dizziness.  She is taking her usual medicines including nebulizers and inhalers.  She has had full course of Covid vaccines.  There are no other known modifying factors.    Past Medical History:  Diagnosis Date  . Alcohol abuse   . Allergy   . Anxiety   . Asthma   . Depression    posibly bipolar disorder. Sees Dr [name] for psychiatric care and Dr [name] for therapy  . Headache(784.0)    last migraine 2004  . Hypertension    never on meds     Patient Active Problem List   Diagnosis Date Noted  . Substance abuse (HCC) 02/18/2013  . Alcohol dependence (HCC) 02/18/2013  . Headache disorder 02/26/2012  . 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 ANXIETY 12/26/2010  . DEPRESSION 12/26/2010  . HYPERTENSION 12/26/2010  . ALLERGIC RHINITIS 12/26/2010  . ASTHMA 12/26/2010  . HEADACHE 12/26/2010    Past Surgical History:  Procedure Laterality Date  . benign cyst     removed from left cheek  . TONSILECTOMY, ADENOIDECTOMY, BILATERAL MYRINGOTOMY  AND TUBES       OB History   No obstetric history on file.     Family History  Problem Relation Age of Onset  . Coronary artery disease Other        1st degree relative female <50  . Depression Other        Family Hx  . Diabetes Other        Family Hx 1st degree relative  . Ovarian cancer Other        Family Hx   . Healthy Mother   . Diabetes Father   . Gout Father   . Hypertension Father   . Heart failure Father   . Cancer Father     Social History   Tobacco Use  . Smoking status: Current Every Day Smoker    Packs/day: 0.50    Types: Cigarettes  . Smokeless tobacco: Never Used  . Tobacco comment: Using the E cigarette to help her quit & still smoking  Vaping Use  . Vaping Use: Never used  Substance Use Topics  . Alcohol use: Yes    Comment: 1 glass of wine per day or less  . Drug use: No    Home Medications Prior to Admission medications   Medication Sig Start Date End Date Taking? Authorizing Provider  albuterol (PROVENTIL) (2.5 MG/3ML) 0.083% nebulizer solution Inhale into the lungs. 09/13/15  [provider]  clonazePAM (KLONOPIN) 0.5 MG tablet Take 1 tablet (0.5 mg total) by mouth 3 (three) times daily as needed for anxiety. 08/19/20   Mozingo, Thereasa Solo, NP  escitalopram (LEXAPRO) 10 MG tablet Take one and 1/2 tablets daily for 7 days, then take two tablets daily. 07/22/20   Mozingo, Thereasa Solo, NP  fluticasone (FLONASE) 50 MCG/ACT nasal spray Place 2 sprays into the nose daily. 07/11/13   Nelwyn Salisbury, MD  folic acid (FOLVITE) 1 MG tablet Take 1 tablet (1 mg total) by mouth daily. (Can be purchased from over the counter) :For Folic acid deficiency 02/23/13   Armandina Stammer I, NP  Multiple Vitamin (MULTIVITAMIN WITH MINERALS) TABS Take 1 tablet by mouth daily. For vitamin deficiency 02/23/13   Armandina Stammer I, NP  topiramate (TOPAMAX) 25 MG tablet Take 25 mg by mouth 2 (two) times daily.    [provider]  triamcinolone (NASACORT) 55  MCG/ACT nasal inhaler Place 2 sprays into the nose daily. For allergies 02/23/13   Armandina Stammer I, NP  Vitamin D, Ergocalciferol, (DRISDOL) 1.25 MG (50000 UNIT) CAPS capsule Take 50,000 Units by mouth every 7 (seven) days.    [provider]    Allergies    Acetaminophen and Lithium  Review of Systems   Review of Systems  All other systems reviewed and are negative.   Physical Exam Updated Vital Signs BP 111/72 (BP Location: Right Arm)   Pulse 82   Temp 98.1 F (36.7 C) (Oral)   Resp 16   Ht 5' (1.524 m)   Wt 44 kg   SpO2 96% Comment: Room air sats 88% on room air. EMS placed patient on O2 2L/min via [place] and sats increased  BMI 18.94 kg/m   Physical Exam Vitals and nursing note reviewed.  Constitutional:      General: She is not in acute distress.    Appearance: She is well-developed. She is not ill-appearing, toxic-appearing or diaphoretic.  HENT:     Head: Normocephalic.     Comments: Left parietal contusion without abrasion or bleeding.    Right Ear: External ear normal.     Left Ear: External ear normal.     Nose: No congestion or rhinorrhea.     Mouth/Throat:     Pharynx: No posterior oropharyngeal erythema.  Eyes:     Conjunctiva/sclera: Conjunctivae normal.     Pupils: Pupils are equal, round, and reactive to light.  Neck:     Trachea: Phonation normal.  Cardiovascular:     Rate and Rhythm: Normal rate and regular rhythm.     Heart sounds: Normal heart sounds.  Pulmonary:     Effort: Pulmonary effort is normal.     Breath sounds: Normal breath sounds.  Chest:     Chest wall: No tenderness.  Abdominal:     General: There is no distension.     Palpations: Abdomen is soft.     Tenderness: There is no abdominal tenderness.  Musculoskeletal:        General: No swelling, tenderness or deformity. Normal range of motion.     Cervical back: Normal range of motion and neck supple.     Right lower leg: No edema.     Left lower leg: No edema.  Skin:     General: Skin is warm and dry.  Neurological:     Mental Status: She is alert and oriented to person, place, and time.     Cranial Nerves: No cranial  nerve deficit.     Sensory: No sensory deficit.     Motor: No abnormal muscle tone.     Coordination: Coordination normal.  Psychiatric:        Mood and Affect: Mood normal.        Behavior: Behavior normal.        Thought Content: Thought content normal.        Judgment: Judgment normal.     ED Results / Procedures / Treatments   Labs (all labs ordered are listed, but only abnormal results are displayed) Labs Reviewed - No data to display  EKG None  Radiology DG Chest 2 View  Result Date: 08/26/2020 CLINICAL DATA:  Back pain after falling today. EXAM: CHEST - 2 VIEW COMPARISON:  None. FINDINGS: AP and cross-table lateral views are submitted. The AP view was repeated. The heart size and mediastinal contours are normal. There is mild scarring at the left lung base associated with blunting of the costophrenic angle. The lungs are mildly hyperinflated but otherwise clear. There is no pleural effusion or pneumothorax. No acute osseous findings. IMPRESSION: No active cardiopulmonary process. Electronically Signed   By: Carey BullocksWilliam  Veazey M.D.   On: 08/26/2020 10:49   DG Thoracic Spine 2 View  Result Date: 08/26/2020 CLINICAL DATA:  Back pain after falling today. EXAM: THORACIC SPINE 2 VIEWS COMPARISON:  None. FINDINGS: There are 12 rib-bearing thoracic type vertebral bodies. The alignment is normal aside from a minimal convex right scoliosis. There is no evidence of acute fracture, paraspinal hematoma or widening of the interpedicular distance. Minimal thoracic and mild cervical spine degenerative changes are present. IMPRESSION: No evidence of acute thoracic spine injury. Electronically Signed   By: Carey BullocksWilliam  Veazey M.D.   On: 08/26/2020 10:47   CT Head Wo Contrast  Result Date: 08/26/2020 CLINICAL DATA:  Head injury after fall. EXAM: CT HEAD  WITHOUT CONTRAST CT CERVICAL SPINE WITHOUT CONTRAST TECHNIQUE: Multidetector CT imaging of the head and cervical spine was performed following the standard protocol without intravenous contrast. Multiplanar CT image reconstructions of the cervical spine were also generated. COMPARISON:  None. FINDINGS: CT HEAD FINDINGS Brain: Mild diffuse cortical atrophy is noted. No mass effect or midline shift is noted. Ventricular size is within normal limits. There is no evidence of mass lesion, hemorrhage or acute infarction. Vascular: No hyperdense vessel or unexpected calcification. Skull: Normal. Negative for fracture or focal lesion. Sinuses/Orbits: No acute finding. Other: Moderate-sized left posterior scalp hematoma is noted. CT CERVICAL SPINE FINDINGS Alignment: Normal. Skull base and vertebrae: No acute fracture. No primary bone lesion or focal pathologic process. Soft tissues and spinal canal: No prevertebral fluid or swelling. No visible canal hematoma. Disc levels: Moderate degenerative disc disease is noted at C5-6 and C6-7 with anterior posterior osteophyte formation. Upper chest: Negative. Other: None. IMPRESSION: 1. Mild diffuse cortical atrophy. Moderate-sized left posterior scalp hematoma. No acute intracranial abnormality seen. 2. Multilevel degenerative disc disease. No acute abnormality seen in the cervical spine. Electronically Signed   By: Lupita RaiderJames  Green Jr M.D.   On: 08/26/2020 10:25   CT Cervical Spine Wo Contrast  Result Date: 08/26/2020 CLINICAL DATA:  Head injury after fall. EXAM: CT HEAD WITHOUT CONTRAST CT CERVICAL SPINE WITHOUT CONTRAST TECHNIQUE: Multidetector CT imaging of the head and cervical spine was performed following the standard protocol without intravenous contrast. Multiplanar CT image reconstructions of the cervical spine were also generated. COMPARISON:  None. FINDINGS: CT HEAD FINDINGS Brain: Mild diffuse cortical atrophy is noted. No mass  effect or midline shift is noted.  Ventricular size is within normal limits. There is no evidence of mass lesion, hemorrhage or acute infarction. Vascular: No hyperdense vessel or unexpected calcification. Skull: Normal. Negative for fracture or focal lesion. Sinuses/Orbits: No acute finding. Other: Moderate-sized left posterior scalp hematoma is noted. CT CERVICAL SPINE FINDINGS Alignment: Normal. Skull base and vertebrae: No acute fracture. No primary bone lesion or focal pathologic process. Soft tissues and spinal canal: No prevertebral fluid or swelling. No visible canal hematoma. Disc levels: Moderate degenerative disc disease is noted at C5-6 and C6-7 with anterior posterior osteophyte formation. Upper chest: Negative. Other: None. IMPRESSION: 1. Mild diffuse cortical atrophy. Moderate-sized left posterior scalp hematoma. No acute intracranial abnormality seen. 2. Multilevel degenerative disc disease. No acute abnormality seen in the cervical spine. Electronically Signed   By: Lupita Raider M.D.   On: 08/26/2020 10:25    Procedures Procedures (including critical care time)  Medications Ordered in ED Medications - No data to display  ED Course  I have reviewed the triage vital signs and the nursing notes.  Pertinent labs & imaging results that were available during my care of the patient were reviewed by me and considered in my medical decision making (see chart for details).  Clinical Course as of Aug 26 1105  Mon Aug 26, 2020  1100 No visible rib injury or pneumothorax; interpreted by me  DG Chest 2 View [EW]  1100 No fracture or dislocation, interpreted by me  DG Thoracic Spine 2 View [EW]  1100 CT head and cervical spine, per radiology interpretation, no acute injuries; DJD of the cervical spine.   [EW]    Clinical Course User Index [EW] Mancel Bale, MD   MDM Rules/Calculators/A&P                           Patient Vitals for the past 24 hrs:  BP Temp Temp src Pulse Resp SpO2 Height Weight  08/26/20 0934  -- -- -- -- -- -- 5' (1.524 m) 44 kg  08/26/20 0927 -- -- -- -- -- 96 % -- --  08/26/20 0926 111/72 98.1 F (36.7 C) Oral 82 16 (!) 83 % -- --    11:03 AM Reevaluation with update and discussion. After initial assessment and treatment, an updated evaluation reveals she is sitting up alert and comfortable.  Cervical collar removed.  Normal range of motion of neck without pain.Mancel Bale   Medical Decision Making:  This patient is presenting for evaluation of injuries from fall.  Presenting by EMS, which does require a range of treatment options, and is a complaint that involves a high risk of morbidity and mortality. The differential diagnoses include head injury, neck injury, chest injury, intrathoracic disorder. I decided to review old records, and in summary elderly patient with heavy smoking, likely COPD, presenting for evaluation of a fall from height of about 30 inches..  I did not require additional historical information from anyone.   Radiologic Tests Ordered, included plain images of thoracic spine, and chest; CTs head and cervical spine.  I independently Visualized: Radiographic images, which show no acute injury    Critical Interventions-clinical evaluation, observation reassessment  After These Interventions, the Patient was reevaluated and was found able to ambulate easily, with normal mentation.  No indication for further ED intervention, evaluation or hospitalization.  CRITICAL CARE-no Performed by: Mancel Bale  Nursing Notes Reviewed/ Care Coordinated Applicable Imaging Reviewed Interpretation of  Laboratory Data incorporated into ED treatment  The patient appears reasonably screened and/or stabilized for discharge and I doubt any other medical condition or other [facility] requiring further screening, evaluation, or treatment in the ED at this time prior to discharge.  Plan: Home Medications-continue routine medication and use Tylenol for pain; Home Treatments-ice to  sore areas for 2 days after that heat; return here if the recommended treatment, does not improve the symptoms; Recommended follow up-PCP if not better in 3 to 4 days.     Final Clinical Impression(s) / ED Diagnoses Final diagnoses:  Injury of head, initial encounter  Fall, initial encounter  Injury of back, initial encounter    Rx / DC Orders ED Discharge Orders    None       Mancel Bale, MD 08/26/20 1106

## 2020-09-16 ENCOUNTER — Encounter: Payer: Self-pay | Admitting: Adult Health

## 2020-09-16 ENCOUNTER — Other Ambulatory Visit: Payer: Self-pay

## 2020-09-16 ENCOUNTER — Ambulatory Visit (INDEPENDENT_AMBULATORY_CARE_PROVIDER_SITE_OTHER): Payer: Self-pay | Admitting: Adult Health

## 2020-09-16 DIAGNOSIS — F331 Major depressive disorder, recurrent, moderate: Secondary | ICD-10-CM

## 2020-09-16 DIAGNOSIS — F411 Generalized anxiety disorder: Secondary | ICD-10-CM

## 2020-09-16 MED ORDER — ESCITALOPRAM OXALATE 10 MG PO TABS: ORAL_TABLET | ORAL | 2 refills | Status: DC

## 2020-09-16 MED ORDER — CLONAZEPAM 0.5 MG PO TABS: 0.5000 mg | ORAL_TABLET | Freq: Three times a day (TID) | ORAL | 2 refills | Status: DC | PRN

## 2020-09-16 NOTE — Progress Notes (Signed)
Kelly Zuniga [id number] 04/07/1966 54 y.o.  Subjective:   Patient ID:  Kelly Zuniga is a 54 y.o. (DOB [birth year]) female.  Chief Complaint: No chief complaint on file.   HPI Kelly Zuniga presents to the office today for follow-up of MDD, GAD, and insomnia.  Describes mood today as "ok". Pleasant. Mood symptoms - denies depression, anxiety, and irritability. Denies tearfulness. Increased worry. Stating "I still worry about a lot of things". Car "broke" down this morning. Feels like she has too much on her plate. Trying to do things to help herself - listening to music. Hoping to se a therapist. Stable interest and motivation. Taking medications as prescribed.  Energy levels stable. Active, has a regular exercise routine. Walks 10,000 plus steps every day.  Enjoys some usual interests and activities. Lives with partner and his 2 children 70 and 36. Has a dog - rescue. Spending time with family. Appetite adequate. Weight stable - 97 pounds. Sleeps better some nights than others. Averages 7 to 8 hours a night.  Focus and concentration stable. Completing tasks. Managing aspects of household. Works part time at [employer]. Works 6 hours a day.  Denies SI or HI. Denies AH or VH.  Previous medication trials: Xanax, Zoloft    Review of Systems:  Review of Systems  Musculoskeletal: Negative for gait problem.  Neurological: Negative for tremors.  Psychiatric/Behavioral:       Please refer to HPI    Medications: I have reviewed the patient's current medications.  Current Outpatient Medications  Medication Sig Dispense Refill  . albuterol (PROVENTIL) (2.5 MG/3ML) 0.083% nebulizer solution Inhale into the lungs.    . clonazePAM (KLONOPIN) 0.5 MG tablet Take 1 tablet (0.5 mg total) by mouth 3 (three) times daily as needed for anxiety. 90 tablet 2  . escitalopram (LEXAPRO) 10 MG tablet Take one and 1/2 tablets daily for 7 days, then take two tablets daily. 60 tablet 2  . fluticasone (FLONASE)  50 MCG/ACT nasal spray Place 2 sprays into the nose daily. 48 g 1  . folic acid (FOLVITE) 1 MG tablet Take 1 tablet (1 mg total) by mouth daily. (Can be purchased from over the counter) :For Folic acid deficiency 30 tablet 0  . Multiple Vitamin (MULTIVITAMIN WITH MINERALS) TABS Take 1 tablet by mouth daily. For vitamin deficiency    . topiramate (TOPAMAX) 25 MG tablet Take 25 mg by mouth 2 (two) times daily.    Marland Kitchen triamcinolone (NASACORT) 55 MCG/ACT nasal inhaler Place 2 sprays into the nose daily. For allergies 1 Inhaler 11  . Vitamin D, Ergocalciferol, (DRISDOL) 1.25 MG (50000 UNIT) CAPS capsule Take 50,000 Units by mouth every 7 (seven) days.     No current facility-administered medications for this visit.    Medication Side Effects: None  Allergies:  Allergies  Allergen Reactions  . Acetaminophen Other (See Comments)    Makes LFT gets elevated Causes liver problems Liver issues   . Lithium Other (See Comments)    REACTION: no appetite unknown    Past Medical History:  Diagnosis Date  . Alcohol abuse   . Allergy   . Anxiety   . Asthma   . Depression    posibly bipolar disorder. Sees Dr [name] for psychiatric care and Dr [name] for therapy  . Headache(784.0)    last migraine 2004  . Hypertension    never on meds     Family History  Problem Relation Age of Onset  . Coronary artery disease Other  1st degree relative female <50  . Depression Other        Family Hx  . Diabetes Other        Family Hx 1st degree relative  . Ovarian cancer Other        Family Hx   . Healthy Mother   . Diabetes Father   . Gout Father   . Hypertension Father   . Heart failure Father   . Cancer Father     Social History   Socioeconomic History  . Marital status: Married    Spouse name: Not on file  . Number of children: Not on file  . Years of education: Not on file  . Highest education level: Not on file  Occupational History  . Not on file  Tobacco Use  .  Smoking status: Current Every Day Smoker    Packs/day: 0.50    Types: Cigarettes  . Smokeless tobacco: Never Used  . Tobacco comment: Using the E cigarette to help her quit & still smoking  Vaping Use  . Vaping Use: Never used  Substance and Sexual Activity  . Alcohol use: Yes    Comment: 1 glass of wine per day or less  . Drug use: No  . Sexual activity: Not on file  Other Topics Concern  . Not on file  Social History Narrative  . Not on file   Social Determinants of Health   Financial Resource Strain:   . Difficulty of Paying Living Expenses: Not on file  Food Insecurity:   . Worried About Programme researcher, broadcasting/film/video in the Last Year: Not on file  . Ran Out of Food in the Last Year: Not on file  Transportation Needs:   . Lack of Transportation (Medical): Not on file  . Lack of Transportation (Non-Medical): Not on file  Physical Activity:   . Days of Exercise per Week: Not on file  . Minutes of Exercise per Session: Not on file  Stress:   . Feeling of Stress : Not on file  Social Connections:   . Frequency of Communication with Friends and Family: Not on file  . Frequency of Social Gatherings with Friends and Family: Not on file  . Attends Religious Services: Not on file  . Active Member of Clubs or Organizations: Not on file  . Attends Banker Meetings: Not on file  . Marital Status: Not on file  Intimate Partner Violence:   . Fear of Current or Ex-Partner: Not on file  . Emotionally Abused: Not on file  . Physically Abused: Not on file  . Sexually Abused: Not on file    Past Medical History, Surgical history, Social history, and Family history were reviewed and updated as appropriate.   Please see review of systems for further details on the patient's review from today.   Objective:   Physical Exam:  There were no vitals taken for this visit.  Physical Exam Constitutional:      General: She is not in acute distress. Musculoskeletal:        General:  No deformity.  Neurological:     Mental Status: She is alert and oriented to person, place, and time.     Coordination: Coordination normal.  Psychiatric:        Attention and Perception: Attention and perception normal. She does not perceive auditory or visual hallucinations.        Mood and Affect: Mood normal. Mood is not anxious or depressed. Affect is not  labile, blunt, angry or inappropriate.        Speech: Speech normal.        Behavior: Behavior normal.        Thought Content: Thought content normal. Thought content is not paranoid or delusional. Thought content does not include homicidal or suicidal ideation. Thought content does not include homicidal or suicidal plan.        Cognition and Memory: Cognition and memory normal.        Judgment: Judgment normal.     Comments: Insight intact     Lab Review:     Component Value Date/Time   NA 136 02/28/2013 1223   K 4.5 02/28/2013 1223   CL 103 02/28/2013 1223   CO2 20 02/28/2013 1223   GLUCOSE 69 (L) 02/28/2013 1223   BUN 4 (L) 02/28/2013 1223   CREATININE 0.5 02/28/2013 1223   CALCIUM 9.1 02/28/2013 1223   PROT 7.0 02/28/2013 1223   ALBUMIN 3.5 02/28/2013 1223   AST 76 (H) 02/28/2013 1223   ALT 68 (H) 02/28/2013 1223   ALKPHOS 113 02/28/2013 1223   BILITOT 0.5 02/28/2013 1223   GFRNONAA >90 02/16/2013 1507   GFRAA >90 02/16/2013 1507       Component Value Date/Time   WBC 7.0 02/16/2013 1507   RBC 3.51 (L) 02/16/2013 1507   HGB 13.3 02/16/2013 1507   HCT 38.8 02/16/2013 1507   PLT 234 02/16/2013 1507   MCV 110.5 (H) 02/16/2013 1507   MCH 37.9 (H) 02/16/2013 1507   MCHC 34.3 02/16/2013 1507   RDW 13.6 02/16/2013 1507   LYMPHSABS 1.5 02/16/2013 1507   MONOABS 0.7 02/16/2013 1507   EOSABS 0.3 02/16/2013 1507   BASOSABS 0.1 02/16/2013 1507    No results found for: POCLITH, LITHIUM   No results found for: PHENYTOIN, PHENOBARB, VALPROATE, CBMZ   .res Assessment: Plan:    Plan:  PDMP reviewed  1.  Clonazepam 0.5mg  TID - usually takes one tablet daily  2. Increase Lexapro 15mg  to 20mg  daily   RTC 4 weeks  Patient advised to contact office with any questions, adverse effects, or acute worsening in signs and symptoms.  Discussed potential benefits, risk, and side effects of benzodiazepines to include potential risk of tolerance and dependence, as well as possible drowsiness.  Advised patient not to drive if experiencing drowsiness and to take lowest possible effective dose to minimize risk of dependence and tolerance.   Diagnoses and all orders for this visit:  Generalized anxiety disorder -     escitalopram (LEXAPRO) 10 MG tablet; Take one and 1/2 tablets daily for 7 days, then take two tablets daily. -     clonazePAM (KLONOPIN) 0.5 MG tablet; Take 1 tablet (0.5 mg total) by mouth 3 (three) times daily as needed for anxiety.  Major depressive disorder, recurrent episode, moderate (HCC) -     escitalopram (LEXAPRO) 10 MG tablet; Take one and 1/2 tablets daily for 7 days, then take two tablets daily.     Please see After Visit Summary for patient specific instructions.  Future Appointments  Date Time Provider Department Center  11/12/2020 12:50 PM , NP PCP-PCP [facility]    No orders of the defined types were placed in this encounter.   -------------------------------

## 2020-10-14 ENCOUNTER — Ambulatory Visit (INDEPENDENT_AMBULATORY_CARE_PROVIDER_SITE_OTHER): Payer: Self-pay | Admitting: Adult Health

## 2020-10-14 ENCOUNTER — Other Ambulatory Visit: Payer: Self-pay

## 2020-10-14 ENCOUNTER — Encounter: Payer: Self-pay | Admitting: Adult Health

## 2020-10-14 DIAGNOSIS — G4700 Insomnia, unspecified: Secondary | ICD-10-CM

## 2020-10-14 DIAGNOSIS — F411 Generalized anxiety disorder: Secondary | ICD-10-CM

## 2020-10-14 DIAGNOSIS — F331 Major depressive disorder, recurrent, moderate: Secondary | ICD-10-CM

## 2020-10-14 MED ORDER — BUPROPION HCL ER (XL) 150 MG PO TB24: ORAL_TABLET | ORAL | 2 refills | Status: DC

## 2020-10-14 NOTE — Progress Notes (Signed)
Kelly Zuniga [id number] 11-01-1966 54 y.o.  Subjective:   Patient ID:  Kelly Zuniga is a 54 y.o. (DOB 05-Nov-1966) female.  Chief Complaint: No chief complaint on file.   HPI [name] presents to the office today for follow-up of MDD, GAD, and insomnia.  Describes mood today as "alright". Pleasant. Increased tearfulness. Mood symptoms - decreased anxiety. Irritable at times. Feels depressed. Decreased worrying about "everything on the planet". Able to handle stress better. Taking things one day at a time. Feels "even keeled" on the anxiety level. Feels more depressed "lately". Procrastinating more. Doesn't have the desire to do the things she likes to do. Stating "I feel like I'm not motivated to do anything".  Plans to see a therapist. Stable interest and motivation. Taking medications as prescribed.  Energy levels "blah". Active, has a regular exercise routine. Walks 10,000 plus steps every day.  Enjoys some usual interests and activities. Lives with partner and his 2 children 49 and 14. Has a dog - rescue. Spending time with family. Appetite adequate. Weigt stable - 98 pounds. Sleeps well most nights.  Averages 7 to 8 hours a night.  Focus and concentration stable. Completing tasks. Managing aspects of household. Works part time at [employer]. Works 6 hours a day.  Denies SI or HI. Denies AH or VH.  Previous medication trials: Xanax, Zoloft   Review of Systems:  Review of Systems  Musculoskeletal: Negative for gait problem.  Neurological: Negative for tremors.  Psychiatric/Behavioral:       Please refer to HPI    Medications: I have reviewed the patient's current medications.  Current Outpatient Medications  Medication Sig Dispense Refill  . albuterol (PROVENTIL) (2.5 MG/3ML) 0.083% nebulizer solution Inhale into the lungs.    Marland Kitchen buPROPion (WELLBUTRIN XL) 150 MG 24 hr tablet Take one tablet daily x 7 days, then take two tablets daily. 60 tablet 2  . clonazePAM (KLONOPIN)  0.5 MG tablet Take 1 tablet (0.5 mg total) by mouth 3 (three) times daily as needed for anxiety. 90 tablet 2  . escitalopram (LEXAPRO) 10 MG tablet Take one and 1/2 tablets daily for 7 days, then take two tablets daily. 60 tablet 2  . fluticasone (FLONASE) 50 MCG/ACT nasal spray Place 2 sprays into the nose daily. 48 g 1  . folic acid (FOLVITE) 1 MG tablet Take 1 tablet (1 mg total) by mouth daily. (Can be purchased from over the counter) :For Folic acid deficiency 30 tablet 0  . Multiple Vitamin (MULTIVITAMIN WITH MINERALS) TABS Take 1 tablet by mouth daily. For vitamin deficiency    . topiramate (TOPAMAX) 25 MG tablet Take 25 mg by mouth 2 (two) times daily.    Marland Kitchen triamcinolone (NASACORT) 55 MCG/ACT nasal inhaler Place 2 sprays into the nose daily. For allergies 1 Inhaler 11  . Vitamin D, Ergocalciferol, (DRISDOL) 1.25 MG (50000 UNIT) CAPS capsule Take 50,000 Units by mouth every 7 (seven) days.     No current facility-administered medications for this visit.    Medication Side Effects: None  Allergies:  Allergies  Allergen Reactions  . Acetaminophen Other (See Comments)    Makes LFT gets elevated Causes liver problems Liver issues   . Lithium Other (See Comments)    REACTION: no appetite unknown    Past Medical History:  Diagnosis Date  . Alcohol abuse   . Allergy   . Anxiety   . Asthma   . Depression    posibly bipolar disorder. Sees Dr [name] for psychiatric care  and Dr [name] for therapy  . Headache(784.0)    last migraine 2004  . Hypertension    never on meds     Family History  Problem Relation Age of Onset  . Coronary artery disease Other        1st degree relative female <50  . Depression Other        Family Hx  . Diabetes Other        Family Hx 1st degree relative  . Ovarian cancer Other        Family Hx   . Healthy Mother   . Diabetes Father   . Gout Father   . Hypertension Father   . Heart failure Father   . Cancer Father     Social  History   Socioeconomic History  . Marital status: Married    Spouse name: Not on file  . Number of children: Not on file  . Years of education: Not on file  . Highest education level: Not on file  Occupational History  . Not on file  Tobacco Use  . Smoking status: Current Every Day Smoker    Packs/day: 0.50    Types: Cigarettes  . Smokeless tobacco: Never Used  . Tobacco comment: Using the E cigarette to help her quit & still smoking  Vaping Use  . Vaping Use: Never used  Substance and Sexual Activity  . Alcohol use: Yes    Comment: 1 glass of wine per day or less  . Drug use: No  . Sexual activity: Not on file  Other Topics Concern  . Not on file  Social History Narrative  . Not on file   Social Determinants of Health   Financial Resource Strain:   . Difficulty of Paying Living Expenses: Not on file  Food Insecurity:   . Worried About Programme researcher, broadcasting/film/video in the Last Year: Not on file  . Ran Out of Food in the Last Year: Not on file  Transportation Needs:   . Lack of Transportation (Medical): Not on file  . Lack of Transportation (Non-Medical): Not on file  Physical Activity:   . Days of Exercise per Week: Not on file  . Minutes of Exercise per Session: Not on file  Stress:   . Feeling of Stress : Not on file  Social Connections:   . Frequency of Communication with Friends and Family: Not on file  . Frequency of Social Gatherings with Friends and Family: Not on file  . Attends Religious Services: Not on file  . Active Member of Clubs or Organizations: Not on file  . Attends Banker Meetings: Not on file  . Marital Status: Not on file  Intimate Partner Violence:   . Fear of Current or Ex-Partner: Not on file  . Emotionally Abused: Not on file  . Physically Abused: Not on file  . Sexually Abused: Not on file    Past Medical History, Surgical history, Social history, and Family history were reviewed and updated as appropriate.   Please see review  of systems for further details on the patient's review from today.   Objective:   Physical Exam:  There were no vitals taken for this visit.  Physical Exam Constitutional:      General: She is not in acute distress. Musculoskeletal:        General: No deformity.  Neurological:     Mental Status: She is alert and oriented to person, place, and time.  Coordination: Coordination normal.  Psychiatric:        Attention and Perception: Attention and perception normal. She does not perceive auditory or visual hallucinations.        Mood and Affect: Mood normal. Mood is not anxious or depressed. Affect is not labile, blunt, angry or inappropriate.        Speech: Speech normal.        Behavior: Behavior normal.        Thought Content: Thought content normal. Thought content is not paranoid or delusional. Thought content does not include homicidal or suicidal ideation. Thought content does not include homicidal or suicidal plan.        Cognition and Memory: Cognition and memory normal.        Judgment: Judgment normal.     Comments: Insight intact     Lab Review:     Component Value Date/Time   NA 136 02/28/2013 1223   K 4.5 02/28/2013 1223   CL 103 02/28/2013 1223   CO2 20 02/28/2013 1223   GLUCOSE 69 (L) 02/28/2013 1223   BUN 4 (L) 02/28/2013 1223   CREATININE 0.5 02/28/2013 1223   CALCIUM 9.1 02/28/2013 1223   PROT 7.0 02/28/2013 1223   ALBUMIN 3.5 02/28/2013 1223   AST 76 (H) 02/28/2013 1223   ALT 68 (H) 02/28/2013 1223   ALKPHOS 113 02/28/2013 1223   BILITOT 0.5 02/28/2013 1223   GFRNONAA >90 02/16/2013 1507   GFRAA >90 02/16/2013 1507       Component Value Date/Time   WBC 7.0 02/16/2013 1507   RBC 3.51 (L) 02/16/2013 1507   HGB 13.3 02/16/2013 1507   HCT 38.8 02/16/2013 1507   PLT 234 02/16/2013 1507   MCV 110.5 (H) 02/16/2013 1507   MCH 37.9 (H) 02/16/2013 1507   MCHC 34.3 02/16/2013 1507   RDW 13.6 02/16/2013 1507   LYMPHSABS 1.5 02/16/2013 1507   MONOABS  0.7 02/16/2013 1507   EOSABS 0.3 02/16/2013 1507   BASOSABS 0.1 02/16/2013 1507    No results found for: POCLITH, LITHIUM   No results found for: PHENYTOIN, PHENOBARB, VALPROATE, CBMZ   .res Assessment: Plan:    Plan:  PDMP reviewed  1. Clonazepam 0.5mg  TID - usually takes one tablet daily  2. Lexapro 20mg  daily  3. Add Wellbutrin XL 150mg  daily x 7 days, then take 2 tablets daily - denies seizure history.  RTC 4 weeks  Patient advised to contact office with any questions, adverse effects, or acute worsening in signs and symptoms.  Discussed potential benefits, risk, and side effects of benzodiazepines to include potential risk of tolerance and dependence, as well as possible drowsiness.  Advised patient not to drive if experiencing drowsiness and to take lowest possible effective dose to minimize risk of dependence and tolerance.  Diagnoses and all orders for this visit:  Insomnia, unspecified type  Generalized anxiety disorder -     buPROPion (WELLBUTRIN XL) 150 MG 24 hr tablet; Take one tablet daily x 7 days, then take two tablets daily.  Major depressive disorder, recurrent episode, moderate (HCC) -     buPROPion (WELLBUTRIN XL) 150 MG 24 hr tablet; Take one tablet daily x 7 days, then take two tablets daily.     Please see After Visit Summary for patient specific instructions.  Future Appointments  Date Time Provider Department Center  11/12/2020 12:50 PM , NP PCP-PCP [facility]    No orders of the defined types were placed in this encounter.   -------------------------------

## 2020-11-11 ENCOUNTER — Other Ambulatory Visit: Payer: Self-pay

## 2020-11-11 ENCOUNTER — Ambulatory Visit (INDEPENDENT_AMBULATORY_CARE_PROVIDER_SITE_OTHER): Payer: Self-pay | Admitting: Adult Health

## 2020-11-11 ENCOUNTER — Encounter: Payer: Self-pay | Admitting: Adult Health

## 2020-11-11 DIAGNOSIS — F331 Major depressive disorder, recurrent, moderate: Secondary | ICD-10-CM

## 2020-11-11 DIAGNOSIS — F411 Generalized anxiety disorder: Secondary | ICD-10-CM

## 2020-11-11 DIAGNOSIS — G4700 Insomnia, unspecified: Secondary | ICD-10-CM

## 2020-11-11 MED ORDER — ESCITALOPRAM OXALATE 10 MG PO TABS: ORAL_TABLET | ORAL | 2 refills | Status: DC

## 2020-11-11 MED ORDER — CLONAZEPAM 0.5 MG PO TABS: 0.5000 mg | ORAL_TABLET | Freq: Three times a day (TID) | ORAL | 2 refills | Status: DC | PRN

## 2020-11-11 NOTE — Progress Notes (Signed)
Kelly Zuniga [id number] [birth year] 54 y.o.  Subjective:   Patient ID:  Kelly Zuniga is a 54 y.o. (DOB [birth year]) female.  Chief Complaint: No chief complaint on file.   HPI Kelly Zuniga presents to the office today for follow-up of MDD, GAD, and insomnia.  Describes mood today as "better". Pleasant. Dereased tearfulness. Mood symptoms - decreased anxiety. Irritable at times. Feels depressed - "it is better". Not worrying as much. Addition of Wellbutrin has helped with "procrastination".  Plans to increase Wellbutrin XL 150 to 300mg  every morning. Increased interest and motivation. Taking medications as prescribed.  Energy levels improved. Active, has a regular exercise routine. Enjoys some usual interests and activities. Lives with partner and his 2 children 15 and 43. Has a dog - rescue. Spending time with family. Appetite adequate. Weigt stable - 98 pounds. Sleeps well most nights.  Averages 7 to 8 hours a night.  Focus and concentration stable. Completing tasks. Managing aspects of household. Works part time at [employer]. Works 6 hours a day.  Denies SI or HI. Denies AH or VH.  Previous medication trials: Xanax, Zoloft   Review of Systems:  Review of Systems  Musculoskeletal: Negative for gait problem.  Neurological: Negative for tremors.  Psychiatric/Behavioral:       Please refer to HPI    Medications: I have reviewed the patient's current medications.  Current Outpatient Medications  Medication Sig Dispense Refill   albuterol (PROVENTIL) (2.5 MG/3ML) 0.083% nebulizer solution Inhale into the lungs.     buPROPion (WELLBUTRIN XL) 150 MG 24 hr tablet Take one tablet daily x 7 days, then take two tablets daily. 60 tablet 2   clonazePAM (KLONOPIN) 0.5 MG tablet Take 1 tablet (0.5 mg total) by mouth 3 (three) times daily as needed for anxiety. 90 tablet 2   escitalopram (LEXAPRO) 10 MG tablet Take one and 1/2 tablets daily for 7 days, then take two tablets daily. 60  tablet 2   fluticasone (FLONASE) 50 MCG/ACT nasal spray Place 2 sprays into the nose daily. 48 g 1   folic acid (FOLVITE) 1 MG tablet Take 1 tablet (1 mg total) by mouth daily. (Can be purchased from over the counter) :For Folic acid deficiency 30 tablet 0   Multiple Vitamin (MULTIVITAMIN WITH MINERALS) TABS Take 1 tablet by mouth daily. For vitamin deficiency     topiramate (TOPAMAX) 25 MG tablet Take 25 mg by mouth 2 (two) times daily.     triamcinolone (NASACORT) 55 MCG/ACT nasal inhaler Place 2 sprays into the nose daily. For allergies 1 Inhaler 11   Vitamin D, Ergocalciferol, (DRISDOL) 1.25 MG (50000 UNIT) CAPS capsule Take 50,000 Units by mouth every 7 (seven) days.     No current facility-administered medications for this visit.    Medication Side Effects: None  Allergies:  Allergies  Allergen Reactions   Acetaminophen Other (See Comments)    Makes LFT gets elevated Causes liver problems Liver issues    Lithium Other (See Comments)    REACTION: no appetite unknown   Codeine    Morphine And Related     Past Medical History:  Diagnosis Date   Alcohol abuse    Allergy    Anxiety    Asthma    Asthma    [employer] 11/09/2020   Depression    posibly bipolar disorder. Sees Dr 11/11/2020 for psychiatric care and Dr [name] for therapy   Depression    [employer] 11/09/2020   Headache(784.0)    last migraine 2004  Hypertension    never on meds    Substance abuse (HCC)    [employer] 11/09/2020    Family History  Problem Relation Age of Onset   Coronary artery disease Other        1st degree relative female <50   Depression Other        Family Hx   Diabetes Other        Family Hx 1st degree relative   Ovarian cancer Other        Family Hx    Healthy Mother    Diabetes Father    Gout Father    Hypertension Father    Heart failure Father    Cancer Father     Social History   Socioeconomic History   Marital status: Married     Spouse name: Not on file   Number of children: Not on file   Years of education: Not on file   Highest education level: Not on file  Occupational History   Not on file  Tobacco Use   Smoking status: Current Every Day Smoker    Packs/day: 0.50    Types: Cigarettes   Smokeless tobacco: Never Used   Tobacco comment: Using the E cigarette to help her quit & still smoking  Vaping Use   Vaping Use: Never used  Substance and Sexual Activity   Alcohol use: Yes    Comment: 1 glass of wine per day or less   Drug use: No   Sexual activity: Not on file  Other Topics Concern   Not on file  Social History Narrative   Not on file   Social Determinants of Health   Financial Resource Strain:    Difficulty of Paying Living Expenses: Not on file  Food Insecurity:    Worried About Running Out of Food in the Last Year: Not on file   Ran Out of Food in the Last Year: Not on file  Transportation Needs:    Lack of Transportation (Medical): Not on file   Lack of Transportation (Non-Medical): Not on file  Physical Activity:    Days of Exercise per Week: Not on file   Minutes of Exercise per Session: Not on file  Stress:    Feeling of Stress : Not on file  Social Connections:    Frequency of Communication with Friends and Family: Not on file   Frequency of Social Gatherings with Friends and Family: Not on file   Attends Religious Services: Not on file   Active Member of Clubs or Organizations: Not on file   Attends BankerClub or Organization Meetings: Not on file   Marital Status: Not on file  Intimate Partner Violence:    Fear of Current or Ex-Partner: Not on file   Emotionally Abused: Not on file   Physically Abused: Not on file   Sexually Abused: Not on file    Past Medical History, Surgical history, Social history, and Family history were reviewed and updated as appropriate.   Please see review of systems for further details on the patient's review from  today.   Objective:   Physical Exam:  There were no vitals taken for this visit.  Physical Exam Constitutional:      General: She is not in acute distress. Musculoskeletal:        General: No deformity.  Neurological:     Mental Status: She is alert and oriented to person, place, and time.     Coordination: Coordination normal.  Psychiatric:  Attention and Perception: Attention and perception normal. She does not perceive auditory or visual hallucinations.        Mood and Affect: Mood normal. Mood is not anxious or depressed. Affect is not labile, blunt, angry or inappropriate.        Speech: Speech normal.        Behavior: Behavior normal.        Thought Content: Thought content normal. Thought content is not paranoid or delusional. Thought content does not include homicidal or suicidal ideation. Thought content does not include homicidal or suicidal plan.        Cognition and Memory: Cognition and memory normal.        Judgment: Judgment normal.     Comments: Insight intact     Lab Review:     Component Value Date/Time   NA 136 02/28/2013 1223   K 4.5 02/28/2013 1223   CL 103 02/28/2013 1223   CO2 20 02/28/2013 1223   GLUCOSE 69 (L) 02/28/2013 1223   BUN 4 (L) 02/28/2013 1223   CREATININE 0.5 02/28/2013 1223   CALCIUM 9.1 02/28/2013 1223   PROT 7.0 02/28/2013 1223   ALBUMIN 3.5 02/28/2013 1223   AST 76 (H) 02/28/2013 1223   ALT 68 (H) 02/28/2013 1223   ALKPHOS 113 02/28/2013 1223   BILITOT 0.5 02/28/2013 1223   GFRNONAA >90 02/16/2013 1507   GFRAA >90 02/16/2013 1507       Component Value Date/Time   WBC 7.0 02/16/2013 1507   RBC 3.51 (L) 02/16/2013 1507   HGB 13.3 02/16/2013 1507   HCT 38.8 02/16/2013 1507   PLT 234 02/16/2013 1507   MCV 110.5 (H) 02/16/2013 1507   MCH 37.9 (H) 02/16/2013 1507   MCHC 34.3 02/16/2013 1507   RDW 13.6 02/16/2013 1507   LYMPHSABS 1.5 02/16/2013 1507   MONOABS 0.7 02/16/2013 1507   EOSABS 0.3 02/16/2013 1507    BASOSABS 0.1 02/16/2013 1507    No results found for: POCLITH, LITHIUM   No results found for: PHENYTOIN, PHENOBARB, VALPROATE, CBMZ   .res Assessment: Plan:    Plan:  PDMP reviewed  1. Clonazepam 0.5mg  TID - usually takes one tablet daily  2. Lexapro 20mg  daily  3. Cotinue Wellbutrin XL 150mg  daily - plans to increase to 300mg - denies seizure history.  RTC 4 weeks  Patient advised to contact office with any questions, adverse effects, or acute worsening in signs and symptoms.  Discussed potential benefits, risk, and side effects of benzodiazepines to include potential risk of tolerance and dependence, as well as possible drowsiness.  Advised patient not to drive if experiencing drowsiness and to take lowest possible effective dose to minimize risk of dependence and tolerance.    Diagnoses and all orders for this visit:  Insomnia, unspecified type  Generalized anxiety disorder -     escitalopram (LEXAPRO) 10 MG tablet; Take one and 1/2 tablets daily for 7 days, then take two tablets daily. -     clonazePAM (KLONOPIN) 0.5 MG tablet; Take 1 tablet (0.5 mg total) by mouth 3 (three) times daily as needed for anxiety.  Major depressive disorder, recurrent episode, moderate (HCC) -     escitalopram (LEXAPRO) 10 MG tablet; Take one and 1/2 tablets daily for 7 days, then take two tablets daily.     Please see After Visit Summary for patient specific instructions.  Future Appointments  Date Time Provider Department Center  11/12/2020 12:50 PM , NP PCP-PCP [facility]  12/09/2020 10:40 AM Kelly Zuniga,  Thereasa Solo, NP CP-CP None    No orders of the defined types were placed in this encounter.   -------------------------------

## 2020-11-12 ENCOUNTER — Other Ambulatory Visit: Payer: Self-pay

## 2020-11-12 ENCOUNTER — Encounter: Payer: Self-pay | Admitting: Registered Nurse

## 2020-11-12 ENCOUNTER — Ambulatory Visit (INDEPENDENT_AMBULATORY_CARE_PROVIDER_SITE_OTHER): Payer: No Typology Code available for payment source | Admitting: Registered Nurse

## 2020-11-12 VITALS — BP 131/77 | HR 110 | Temp 98.7°F | Resp 18 | Ht 60.0 in | Wt 86.8 lb

## 2020-11-12 DIAGNOSIS — Z1231 Encounter for screening mammogram for malignant neoplasm of breast: Secondary | ICD-10-CM | POA: Diagnosis not present

## 2020-11-12 DIAGNOSIS — Z1211 Encounter for screening for malignant neoplasm of colon: Secondary | ICD-10-CM

## 2020-11-12 DIAGNOSIS — Z0001 Encounter for general adult medical examination with abnormal findings: Secondary | ICD-10-CM

## 2020-11-12 DIAGNOSIS — Z130 Encounter for screening for diseases of the blood and blood-forming organs and certain disorders involving the immune mechanism: Secondary | ICD-10-CM

## 2020-11-12 DIAGNOSIS — Z23 Encounter for immunization: Secondary | ICD-10-CM

## 2020-11-12 DIAGNOSIS — E559 Vitamin D deficiency, unspecified: Secondary | ICD-10-CM

## 2020-11-12 DIAGNOSIS — Z1329 Encounter for screening for other suspected endocrine disorder: Secondary | ICD-10-CM

## 2020-11-12 DIAGNOSIS — Z13220 Encounter for screening for lipoid disorders: Secondary | ICD-10-CM

## 2020-11-12 DIAGNOSIS — Z0000 Encounter for general adult medical examination without abnormal findings: Secondary | ICD-10-CM

## 2020-11-12 DIAGNOSIS — Z7689 Persons encountering health services in other specified circumstances: Secondary | ICD-10-CM

## 2020-11-12 DIAGNOSIS — Z13228 Encounter for screening for other metabolic disorders: Secondary | ICD-10-CM

## 2020-11-12 NOTE — Progress Notes (Signed)
New Patient Office Visit  Subjective:  Patient ID: Kelly Zuniga, female    DOB: [birth year]  Age: 54 y.o. MRN: [id number]  CC:  Chief Complaint  Patient presents with  . New Patient (Initial Visit)    patient states she is here to establish care and CPE.    HPI Kelly Zuniga presents for visit to est care.  Formerly pt at [facility]. Was dissatisfied, wants to switch to [facility]  Reviewed histories. Up to date. Mental health addressed through behavioral health - no concerns with this. Hx of etoh abuse, in remission.  Does note unintentional weight loss of 10lb in past year. No change to diet or exercise. Only other symptom is bowel irregularities - can oscillate between diarrhea and constipation rapidly. Uses otcs to control this with good success. Due for colonoscopy. Will place referral  Past Medical History:  Diagnosis Date  . Alcohol abuse   . Allergy   . Anxiety   . Asthma   . Asthma    Phreesia 11/09/2020  . Depression    posibly bipolar disorder. Sees Dr [name] for psychiatric care and Dr [name] for therapy  . Depression    Phreesia 11/09/2020  . Headache(784.0)    last migraine 2004  . Hypertension    never on meds   . Substance abuse (HCC)    Phreesia 11/09/2020    Past Surgical History:  Procedure Laterality Date  . benign cyst     removed from left cheek  . TONSILECTOMY, ADENOIDECTOMY, BILATERAL MYRINGOTOMY AND TUBES      Family History  Problem Relation Age of Onset  . Coronary artery disease Other        1st degree relative female <50  . Depression Other        Family Hx  . Diabetes Other        Family Hx 1st degree relative  . Ovarian cancer Other        Family Hx   . Healthy Mother   . Diabetes Father   . Gout Father   . Hypertension Father   . Heart failure Father   . Cancer Father     Social History   Socioeconomic History  . Marital status: Significant Other    Spouse name: Not on file  . Number of children: 2  .  Years of education: Not on file  . Highest education level: Not on file  Occupational History  . Not on file  Tobacco Use  . Smoking status: Current Every Day Smoker    Packs/day: 0.50    Types: Cigarettes  . Smokeless tobacco: Never Used  . Tobacco comment: Using the E cigarette to help her quit & still smoking  Vaping Use  . Vaping Use: Never used  Substance and Sexual Activity  . Alcohol use: Yes    Comment: 1 glass of wine per day or less  . Drug use: No  . Sexual activity: Not on file  Other Topics Concern  . Not on file  Social History Narrative  . Not on file   Social Determinants of Health   Financial Resource Strain:   . Difficulty of Paying Living Expenses: Not on file  Food Insecurity:   . Worried About Programme researcher, broadcasting/film/video in the Last Year: Not on file  . Ran Out of Food in the Last Year: Not on file  Transportation Needs:   . Lack of Transportation (Medical): Not on file  . Lack of Transportation (  Non-Medical): Not on file  Physical Activity:   . Days of Exercise per Week: Not on file  . Minutes of Exercise per Session: Not on file  Stress:   . Feeling of Stress : Not on file  Social Connections:   . Frequency of Communication with Friends and Family: Not on file  . Frequency of Social Gatherings with Friends and Family: Not on file  . Attends Religious Services: Not on file  . Active Member of Clubs or Organizations: Not on file  . Attends Banker Meetings: Not on file  . Marital Status: Not on file  Intimate Partner Violence:   . Fear of Current or Ex-Partner: Not on file  . Emotionally Abused: Not on file  . Physically Abused: Not on file  . Sexually Abused: Not on file    ROS Review of Systems Per hpi   Objective:   Today's Vitals: BP 131/77   Pulse (!) 110   Temp 98.7 F (37.1 C) (Temporal)   Resp 18   Ht 5' (1.524 m)   Wt 86 lb 12.8 oz (39.4 kg)   SpO2 95%   BMI 16.95 kg/m   Physical Exam Vitals and nursing note  reviewed.  Constitutional:      General: She is not in acute distress.    Appearance: Normal appearance. She is normal weight. She is not ill-appearing, toxic-appearing or diaphoretic.  HENT:     Head: Normocephalic and atraumatic.     Right Ear: Tympanic membrane, ear canal and external ear normal. There is no impacted cerumen.     Left Ear: Tympanic membrane, ear canal and external ear normal. There is no impacted cerumen.     Nose: Nose normal. No congestion or rhinorrhea.     Mouth/Throat:     Mouth: Mucous membranes are moist.     Pharynx: Oropharynx is clear. No oropharyngeal exudate or posterior oropharyngeal erythema.  Eyes:     General: No scleral icterus.       Right eye: No discharge.        Left eye: No discharge.     Extraocular Movements: Extraocular movements intact.     Conjunctiva/sclera: Conjunctivae normal.     Pupils: Pupils are equal, round, and reactive to light.  Neck:     Vascular: No carotid bruit.  Cardiovascular:     Rate and Rhythm: Normal rate and regular rhythm.     Pulses: Normal pulses.     Heart sounds: Normal heart sounds. No murmur heard.  No friction rub. No gallop.   Pulmonary:     Effort: Pulmonary effort is normal. No respiratory distress.     Breath sounds: Normal breath sounds. No stridor. No wheezing, rhonchi or rales.  Chest:     Chest wall: No tenderness.  Abdominal:     General: Abdomen is flat. Bowel sounds are normal. There is no distension.     Palpations: There is no mass.     Tenderness: There is no abdominal tenderness. There is no right CVA tenderness, left CVA tenderness, guarding or rebound.     Hernia: No hernia is present.  Musculoskeletal:        General: No swelling, tenderness, deformity or signs of injury. Normal range of motion.     Cervical back: Normal range of motion and neck supple. No rigidity or tenderness.     Right lower leg: No edema.     Left lower leg: No edema.  Lymphadenopathy:  Cervical: No  cervical adenopathy.  Skin:    General: Skin is warm and dry.     Capillary Refill: Capillary refill takes less than 2 seconds.     Coloration: Skin is not jaundiced or pale.     Findings: No bruising, erythema, lesion or rash.  Neurological:     General: No focal deficit present.     Mental Status: She is alert and oriented to person, place, and time. Mental status is at baseline.     Cranial Nerves: No cranial nerve deficit.     Sensory: No sensory deficit.     Motor: No weakness.     Coordination: Coordination normal.     Gait: Gait normal.     Deep Tendon Reflexes: Reflexes normal.  Psychiatric:        Mood and Affect: Mood normal.        Behavior: Behavior normal.        Thought Content: Thought content normal.        Judgment: Judgment normal.     Assessment & Plan:   Problem List Items Addressed This Visit    None    Visit Diagnoses    Colon cancer screening    -  Primary   Relevant Orders   Ambulatory referral to Gastroenterology   Screening mammogram for breast cancer       Relevant Orders   MM Digital Diagnostic Bilat   Flu vaccine need       Relevant Orders   Flu Vaccine QUAD 6+ mos PF IM (Fluarix Quad PF) (Completed)   Need for shingles vaccine       Relevant Orders   Varicella-zoster vaccine IM (Shingrix)   Screening for endocrine, metabolic and immunity disorder       Relevant Orders   CBC With Differential   Hemoglobin A1c   Comprehensive metabolic panel   TSH   Lipid screening       Relevant Orders   Lipid panel   Vitamin D deficiency       Relevant Orders   Vitamin D, 25-hydroxy      Outpatient Encounter Medications as of 11/12/2020  Medication Sig  . albuterol (PROVENTIL) (2.5 MG/3ML) 0.083% nebulizer solution Inhale into the lungs.  Marland Kitchen buPROPion (WELLBUTRIN XL) 150 MG 24 hr tablet Take one tablet daily x 7 days, then take two tablets daily.  . clonazePAM (KLONOPIN) 0.5 MG tablet Take 1 tablet (0.5 mg total) by mouth 3 (three) times daily  as needed for anxiety.  Marland Kitchen escitalopram (LEXAPRO) 10 MG tablet Take one and 1/2 tablets daily for 7 days, then take two tablets daily.  . fluticasone (FLONASE) 50 MCG/ACT nasal spray Place 2 sprays into the nose daily.  . folic acid (FOLVITE) 1 MG tablet Take 1 tablet (1 mg total) by mouth daily. (Can be purchased from over the counter) :For Folic acid deficiency  . Multiple Vitamin (MULTIVITAMIN WITH MINERALS) TABS Take 1 tablet by mouth daily. For vitamin deficiency  . topiramate (TOPAMAX) 25 MG tablet Take 25 mg by mouth 2 (two) times daily.  Marland Kitchen triamcinolone (NASACORT) 55 MCG/ACT nasal inhaler Place 2 sprays into the nose daily. For allergies  . Vitamin D, Ergocalciferol, (DRISDOL) 1.25 MG (50000 UNIT) CAPS capsule Take 50,000 Units by mouth every 7 (seven) days.   No facility-administered encounter medications on file as of 11/12/2020.    Follow-up: No follow-ups on file.   PLAN  Exam unremarkable  Labs collected. Will follow up with the patient  as warranted.  Meds as ordered above  Return annually unless labs dictate otherwise  Patient encouraged to call clinic with any questions, comments, or concerns.  Janeece Ageeichard Dontell Mian, NP

## 2020-11-12 NOTE — Patient Instructions (Signed)
° ° ° °  If you have lab work done today you will be contacted with your lab results within the next 2 weeks.  If you have not heard from us then please contact us. The fastest way to get your results is to register for My Chart. ° ° °IF you received an x-ray today, you will receive an invoice from [facility]. Please contact [facility] at [phone] with questions or concerns regarding your invoice.  ° °IF you received labwork today, you will receive an invoice from [facility]. Please contact [facility] at [phone] with questions or concerns regarding your invoice.  ° °Our billing staff will not be able to assist you with questions regarding bills from these companies. ° °You will be contacted with the lab results as soon as they are available. The fastest way to get your results is to activate your My Chart account. Instructions are located on the last page of this paperwork. If you have not heard from us regarding the results in 2 weeks, please contact this office. °  ° ° ° °

## 2020-11-13 LAB — CBC WITH DIFFERENTIAL
Basophils Absolute: 0.1 10*3/uL (ref 0.0–0.2)
Basos: 2 %
EOS (ABSOLUTE): 0.1 10*3/uL (ref 0.0–0.4)
Eos: 4 %
Hematocrit: 35.7 % (ref 34.0–46.6)
Hemoglobin: 12 g/dL (ref 11.1–15.9)
Immature Grans (Abs): 0 10*3/uL (ref 0.0–0.1)
Immature Granulocytes: 0 %
Lymphocytes Absolute: 1 10*3/uL (ref 0.7–3.1)
Lymphs: 28 %
MCH: 33.7 pg — ABNORMAL HIGH (ref 26.6–33.0)
MCHC: 33.6 g/dL (ref 31.5–35.7)
MCV: 100 fL — ABNORMAL HIGH (ref 79–97)
Monocytes Absolute: 0.6 10*3/uL (ref 0.1–0.9)
Monocytes: 16 %
Neutrophils Absolute: 1.7 10*3/uL (ref 1.4–7.0)
Neutrophils: 50 %
RBC: 3.56 x10E6/uL — ABNORMAL LOW (ref 3.77–5.28)
RDW: 12.2 % (ref 11.7–15.4)
WBC: 3.4 10*3/uL (ref 3.4–10.8)

## 2020-11-13 LAB — LIPID PANEL
Chol/HDL Ratio: 2.6 ratio (ref 0.0–4.4)
Cholesterol, Total: 232 mg/dL — ABNORMAL HIGH (ref 100–199)
HDL: 88 mg/dL (ref >39)
LDL Chol Calc (NIH): 131 mg/dL — ABNORMAL HIGH (ref 0–99)
Triglycerides: 77 mg/dL (ref 0–149)
VLDL Cholesterol Cal: 13 mg/dL (ref 5–40)

## 2020-11-13 LAB — COMPREHENSIVE METABOLIC PANEL
ALT: 54 IU/L — ABNORMAL HIGH (ref 0–32)
AST: 125 IU/L — ABNORMAL HIGH (ref 0–40)
Albumin/Globulin Ratio: 2.9 — ABNORMAL HIGH (ref 1.2–2.2)
Albumin: 4.9 g/dL (ref 3.8–4.9)
Alkaline Phosphatase: 79 IU/L (ref 44–121)
BUN/Creatinine Ratio: 20 (ref 9–23)
BUN: 17 mg/dL (ref 6–24)
Bilirubin Total: 0.4 mg/dL (ref 0.0–1.2)
CO2: 23 mmol/L (ref 20–29)
Calcium: 9.7 mg/dL (ref 8.7–10.2)
Chloride: 97 mmol/L (ref 96–106)
Creatinine, Ser: 0.85 mg/dL (ref 0.57–1.00)
GFR calc Af Amer: 90 mL/min/{1.73_m2} (ref >59)
GFR calc non Af Amer: 78 mL/min/{1.73_m2} (ref >59)
Globulin, Total: 1.7 g/dL (ref 1.5–4.5)
Glucose: 179 mg/dL — ABNORMAL HIGH (ref 65–99)
Potassium: 3.9 mmol/L (ref 3.5–5.2)
Sodium: 140 mmol/L (ref 134–144)
Total Protein: 6.6 g/dL (ref 6.0–8.5)

## 2020-11-13 LAB — HEMOGLOBIN A1C
Est. average glucose Bld gHb Est-mCnc: 94 mg/dL
Hgb A1c MFr Bld: 4.9 % (ref 4.8–5.6)

## 2020-11-13 LAB — VITAMIN D 25 HYDROXY (VIT D DEFICIENCY, FRACTURES): Vit D, 25-Hydroxy: 78.7 ng/mL (ref 30.0–100.0)

## 2020-11-13 LAB — TSH: TSH: 1.68 u[IU]/mL (ref 0.450–4.500)

## 2020-11-22 ENCOUNTER — Telehealth: Payer: Self-pay | Admitting: Registered Nurse

## 2020-11-22 ENCOUNTER — Other Ambulatory Visit: Payer: Self-pay | Admitting: Registered Nurse

## 2020-11-22 DIAGNOSIS — R7989 Other specified abnormal findings of blood chemistry: Secondary | ICD-10-CM

## 2020-11-22 NOTE — Telephone Encounter (Signed)
Pt was seen on 11/12/20 by Kateri Plummer for a cpe. Today 11/22/20 she got a phone call saying she needed to schedule an appointment with [facility] for an Abd scan of her liver and gallbladder. She knew nothing about this issue. Please advise pt asap at [phone].

## 2020-11-22 NOTE — Telephone Encounter (Signed)
Pt states was unaware of this scan and reason for it please advise?

## 2020-11-22 NOTE — Telephone Encounter (Signed)
My apologies -   Had placed a result note to PCP lab pool, surprised [facility] called first - we can call her and tell her that her liver enzymes are elevated. This is not beyond what we've seen in the past but it does look like we have not done an ultrasound of her liver, which I think would be prudent to obtain. No acute worries, but would be good to establish where her liver is at.  Thank you  Rich

## 2020-11-22 NOTE — Telephone Encounter (Signed)
Spoke with the pt and she was agreeable she just was unaware of any issue needed imaging pt will call back and schedule

## 2020-11-26 ENCOUNTER — Encounter: Payer: Self-pay | Admitting: Registered Nurse

## 2020-12-09 ENCOUNTER — Encounter: Payer: Self-pay | Admitting: Adult Health

## 2020-12-09 ENCOUNTER — Ambulatory Visit (INDEPENDENT_AMBULATORY_CARE_PROVIDER_SITE_OTHER): Payer: Self-pay | Admitting: Adult Health

## 2020-12-09 ENCOUNTER — Other Ambulatory Visit: Payer: Self-pay

## 2020-12-09 DIAGNOSIS — F411 Generalized anxiety disorder: Secondary | ICD-10-CM

## 2020-12-09 DIAGNOSIS — F331 Major depressive disorder, recurrent, moderate: Secondary | ICD-10-CM

## 2020-12-09 DIAGNOSIS — G4700 Insomnia, unspecified: Secondary | ICD-10-CM

## 2020-12-09 MED ORDER — ESCITALOPRAM OXALATE 10 MG PO TABS: ORAL_TABLET | ORAL | 5 refills | Status: DC

## 2020-12-09 MED ORDER — BUPROPION HCL ER (XL) 300 MG PO TB24: ORAL_TABLET | ORAL | 5 refills | Status: DC

## 2020-12-09 NOTE — Progress Notes (Signed)
Kelly Zuniga [id number] 02-Sep-1966 54 y.o.  Subjective:   Patient ID:  Kelly Zuniga is a 54 y.o. (DOB 08-25-1966) female.  Chief Complaint: No chief complaint on file.   HPI Kelly Zuniga presents to the office today for follow-up of MDD, GAD, and insomnia.  Describes mood today as "ok". Pleasant. Denies tearfulness. Mood symptoms - denies anxiety, depression, and irritability. Denies worry and rumination. Feels like increase in Wellbutrin has "leveled" things out. Stable interest and motivation. Taking medications as prescribed.  Energy levels stable Active, has a regular exercise routine. Enjoys some usual interests and activities. Lives with partner and his 2 children 71 and 68. Has a dog - rescue. Spending time with family. Appetite adequate. Weigt stable - 87.3 pounds. Sleeps well most nights.  Averages 6 to 7 hours a night.  Focus and concentration stable. Completing tasks. Managing aspects of household. Works part time at [employer]. Works 6 hours a day.  Denies SI or HI.  Denies AH or VH.  Previous medication trials: Xanax, Zoloft   PHQ2-9   Flowsheet Row Office Visit from 11/12/2020 in Primary Care at [facility] Total Score 0       Review of Systems:  Review of Systems  Musculoskeletal: Negative for gait problem.  Neurological: Negative for tremors.  Psychiatric/Behavioral:       Please refer to HPI    Medications: I have reviewed the patient's current medications.  Current Outpatient Medications  Medication Sig Dispense Refill   albuterol (PROVENTIL) (2.5 MG/3ML) 0.083% nebulizer solution Inhale into the lungs.     buPROPion (WELLBUTRIN XL) 300 MG 24 hr tablet Take one tablet daily. 30 tablet 5   clonazePAM (KLONOPIN) 0.5 MG tablet Take 1 tablet (0.5 mg total) by mouth 3 (three) times daily as needed for anxiety. 90 tablet 2   escitalopram (LEXAPRO) 10 MG tablet Take one tablet daily. 30 tablet 5   fluticasone (FLONASE) 50 MCG/ACT nasal spray Place 2  sprays into the nose daily. 48 g 1   folic acid (FOLVITE) 1 MG tablet Take 1 tablet (1 mg total) by mouth daily. (Can be purchased from over the counter) :For Folic acid deficiency 30 tablet 0   Multiple Vitamin (MULTIVITAMIN WITH MINERALS) TABS Take 1 tablet by mouth daily. For vitamin deficiency     topiramate (TOPAMAX) 25 MG tablet Take 25 mg by mouth 2 (two) times daily.     triamcinolone (NASACORT) 55 MCG/ACT nasal inhaler Place 2 sprays into the nose daily. For allergies 1 Inhaler 11   Vitamin D, Ergocalciferol, (DRISDOL) 1.25 MG (50000 UNIT) CAPS capsule Take 50,000 Units by mouth every 7 (seven) days.     No current facility-administered medications for this visit.    Medication Side Effects: None  Allergies:  Allergies  Allergen Reactions   Acetaminophen Other (See Comments)    Makes LFT gets elevated Causes liver problems Liver issues  Causes liver problems Liver issues Makes LFT gets elevated Causes liver problems Liver issues   Lithium Other (See Comments)    REACTION: no appetite unknown Other reaction(s): Other unknown REACTION: no appetite unknown   Codeine    Morphine And Related     Past Medical History:  Diagnosis Date   Alcohol abuse    Allergy    Anxiety    Asthma    Asthma    Phreesia 11/09/2020   Depression    posibly bipolar disorder. Sees Dr [name] for psychiatric care and Dr [name] for therapy  Depression    Phreesia 11/09/2020   Headache(784.0)    last migraine 2004   Hypertension    never on meds    Substance abuse (HCC)    Phreesia 11/09/2020    Family History  Problem Relation Age of Onset   Coronary artery disease Other        1st degree relative female <50   Depression Other        Family Hx   Diabetes Other        Family Hx 1st degree relative   Ovarian cancer Other        Family Hx    Healthy Mother    Diabetes Father    Gout Father    Hypertension Father    Heart failure  Father    Cancer Father     Social History   Socioeconomic History   Marital status: Significant Other    Spouse name: Not on file   Number of children: 2   Years of education: Not on file   Highest education level: Not on file  Occupational History   Not on file  Tobacco Use   Smoking status: Current Every Day Smoker    Packs/day: 0.50    Types: Cigarettes   Smokeless tobacco: Never Used   Tobacco comment: Using the E cigarette to help her quit & still smoking  Vaping Use   Vaping Use: Never used  Substance and Sexual Activity   Alcohol use: Yes    Comment: 1 glass of wine per day or less   Drug use: No   Sexual activity: Not on file  Other Topics Concern   Not on file  Social History Narrative   Not on file   Social Determinants of Health   Financial Resource Strain: Not on file  Food Insecurity: Not on file  Transportation Needs: Not on file  Physical Activity: Not on file  Stress: Not on file  Social Connections: Not on file  Intimate Partner Violence: Not on file    Past Medical History, Surgical history, Social history, and Family history were reviewed and updated as appropriate.   Please see review of systems for further details on the patient's review from today.   Objective:   Physical Exam:  There were no vitals taken for this visit.  Physical Exam Constitutional:      General: She is not in acute distress. Musculoskeletal:        General: No deformity.  Neurological:     Mental Status: She is alert and oriented to person, place, and time.     Coordination: Coordination normal.  Psychiatric:        Attention and Perception: Attention and perception normal. She does not perceive auditory or visual hallucinations.        Mood and Affect: Mood normal. Mood is not anxious or depressed. Affect is not labile, blunt, angry or inappropriate.        Speech: Speech normal.        Behavior: Behavior normal.        Thought Content: Thought  content normal. Thought content is not paranoid or delusional. Thought content does not include homicidal or suicidal ideation. Thought content does not include homicidal or suicidal plan.        Cognition and Memory: Cognition and memory normal.        Judgment: Judgment normal.     Comments: Insight intact     Lab Review:     Component Value  Date/Time   NA 140 11/12/2020 1554   K 3.9 11/12/2020 1554   CL 97 11/12/2020 1554   CO2 23 11/12/2020 1554   GLUCOSE 179 (H) 11/12/2020 1554   GLUCOSE 69 (L) 02/28/2013 1223   BUN 17 11/12/2020 1554   CREATININE 0.85 11/12/2020 1554   CALCIUM 9.7 11/12/2020 1554   PROT 6.6 11/12/2020 1554   ALBUMIN 4.9 11/12/2020 1554   AST 125 (H) 11/12/2020 1554   ALT 54 (H) 11/12/2020 1554   ALKPHOS 79 11/12/2020 1554   BILITOT 0.4 11/12/2020 1554   GFRNONAA 78 11/12/2020 1554   GFRAA 90 11/12/2020 1554       Component Value Date/Time   WBC 3.4 11/12/2020 1554   WBC 7.0 02/16/2013 1507   RBC 3.56 (L) 11/12/2020 1554   RBC 3.51 (L) 02/16/2013 1507   HGB 12.0 11/12/2020 1554   HCT 35.7 11/12/2020 1554   PLT 234 02/16/2013 1507   MCV 100 (H) 11/12/2020 1554   MCH 33.7 (H) 11/12/2020 1554   MCH 37.9 (H) 02/16/2013 1507   MCHC 33.6 11/12/2020 1554   MCHC 34.3 02/16/2013 1507   RDW 12.2 11/12/2020 1554   LYMPHSABS 1.0 11/12/2020 1554   MONOABS 0.7 02/16/2013 1507   EOSABS 0.1 11/12/2020 1554   BASOSABS 0.1 11/12/2020 1554    No results found for: POCLITH, LITHIUM   No results found for: PHENYTOIN, PHENOBARB, VALPROATE, CBMZ   .res Assessment: Plan:    Plan:  PDMP reviewed  1. Clonazepam 0.5mg  TID - usually takes one tablet daily  2. Lexapro 20mg  daily  3. Wellbutrin XL 300mg - denies seizure history.  RTC 4 weeks  Patient advised to contact office with any questions, adverse effects, or acute worsening in signs and symptoms.  Discussed potential benefits, risk, and side effects of benzodiazepines to include potential risk of  tolerance and dependence, as well as possible drowsiness.  Advised patient not to drive if experiencing drowsiness and to take lowest possible effective dose to minimize risk of dependence and tolerance.   Diagnoses and all orders for this visit:  Insomnia, unspecified type  Generalized anxiety disorder -     buPROPion (WELLBUTRIN XL) 300 MG 24 hr tablet; Take one tablet daily. -     escitalopram (LEXAPRO) 10 MG tablet; Take one tablet daily.  Major depressive disorder, recurrent episode, moderate (HCC) -     buPROPion (WELLBUTRIN XL) 300 MG 24 hr tablet; Take one tablet daily. -     escitalopram (LEXAPRO) 10 MG tablet; Take one tablet daily.     Please see After Visit Summary for patient specific instructions.  Future Appointments  Date Time Provider Department Center  02/11/2021 11:20 AM PCP-NURSE PCP-PCP [facility]    No orders of the defined types were placed in this encounter.   -------------------------------

## 2020-12-28 DIAGNOSIS — Z1152 Encounter for screening for COVID-19: Secondary | ICD-10-CM | POA: Diagnosis not present

## 2021-01-06 ENCOUNTER — Ambulatory Visit: Payer: Self-pay | Admitting: Adult Health

## 2021-01-14 ENCOUNTER — Ambulatory Visit: Payer: Self-pay

## 2021-01-14 NOTE — Telephone Encounter (Signed)
Vomiting that started yesterday constant taking Gatorade and water. Nothing to eat. No fever, no SOB. Last time called in Phenergan and  Shaking and headache, unable to sleep. Pt denies abdominal pain and stated her urine is clear. Pt is requesting Phenergan to called in to the [place] on [address] and to be notified via MyChart or a call when something is called in or if needs appt. MyChart sign up link sent to pt. Home care advice given and pt verbalized understanding.  Reason for Disposition . [1] SEVERE vomiting (e.g., 6 or more times/day, vomits everything) BUT [2] hydrated  Answer Assessment - Initial Assessment Questions 1. VOMITING SEVERITY: "How many times have you vomited in the past 24 hours?"     - MILD:  1 - 2 times/day    - MODERATE: 3 - 5 times/day, decreased oral intake without significant weight loss or symptoms of dehydration    - SEVERE: 6 or more times/day, vomits everything or nearly everything, with significant weight loss, symptoms of dehydration      severe 2. ONSET: "When did the vomiting begin?"      0300 yesterday 3. FLUIDS: "What fluids or food have you vomited up today?" "Have you been able to keep any fluids down?"     Gatorade, water - no 4. ABDOMINAL PAIN: "Are your having any abdominal pain?" If yes : "How bad is it and what does it feel like?" (e.g., crampy, dull, intermittent, constant)      no 5. DIARRHEA: "Is there any diarrhea?" If Yes, ask: "How many times today?"      no 6. CONTACTS: "Is there anyone else in the family with the same symptoms?"      no 7. CAUSE: "What do you think is causing your vomiting?"     unsure 8. HYDRATION STATUS: "Any signs of dehydration?" (e.g., dry mouth [not only dry lips], too weak to stand) "When did you last urinate?"     Dry mouth but pt thinks it is from sleeping with mouth open- hands are shaking, weakness last voided at 12 mid night 9. OTHER SYMPTOMS: "Do you have any other symptoms?" (e.g., fever,  headache, vertigo, vomiting blood or coffee grounds, recent head injury)     No, mild headache 10. PREGNANCY: "Is there any chance you are pregnant?" "When was your last menstrual period?"      menopause  Protocols used: [facility]

## 2021-01-14 NOTE — Telephone Encounter (Signed)
Called pt ad sch virtual appt for tomorrow 1/26

## 2021-01-14 NOTE — Telephone Encounter (Signed)
She has not been seen since 10/2020 not seen for this issue needs at least a virtual before we can send in new rx

## 2021-01-15 ENCOUNTER — Telehealth (INDEPENDENT_AMBULATORY_CARE_PROVIDER_SITE_OTHER): Payer: No Typology Code available for payment source | Admitting: Registered Nurse

## 2021-01-15 ENCOUNTER — Other Ambulatory Visit: Payer: Self-pay

## 2021-01-15 ENCOUNTER — Encounter: Payer: Self-pay | Admitting: Registered Nurse

## 2021-01-15 VITALS — Ht 60.0 in | Wt 83.0 lb

## 2021-01-15 DIAGNOSIS — R1114 Bilious vomiting: Secondary | ICD-10-CM

## 2021-01-15 MED ORDER — PROMETHAZINE HCL 25 MG PO TABS: 25.0000 mg | ORAL_TABLET | Freq: Three times a day (TID) | ORAL | 0 refills | Status: DC | PRN

## 2021-01-15 NOTE — Progress Notes (Signed)
Telemedicine Encounter- SOAP NOTE Established Patient  This telephone encounter was conducted with the patient's (or proxy's) verbal consent via audio telecommunications: yes  Patient was instructed to have this encounter in a suitably private space; and to only have persons present to whom they give permission to participate. In addition, patient identity was confirmed by use of name plus two identifiers (DOB and address).  I discussed the limitations, risks, security and privacy concerns of performing an evaluation and management service by telephone and the availability of in person appointments. I also discussed with the patient that there may be a patient responsible charge related to this service. The patient expressed understanding and agreed to proceed.  I spent a total of 14 minutes talking with the patient or their proxy.  Patient at home Provider in office  Chief Complaint  Patient presents with  . Emesis    Since Monday morning and cant hold down food or liquids.  Phenergan has helped previously.     Subjective   Kelly Zuniga is a 55 y.o. established patient. Telephone visit today for vomiting  HPI Onset Monday Steady symptoms since Nausea with bilious vomiting No hematochezia or coffee ground emesis No bowel changes No other symptoms concerning for covid or flu No known exposure to food borne pathogens or sick contacts Has happened before, phenergan has helped  Patient Active Problem List   Diagnosis Date Noted  . Substance abuse (HCC) 02/18/2013  . Alcohol dependence (HCC) 02/18/2013  . Headache disorder 02/26/2012  . 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 ANXIETY 12/26/2010  . DEPRESSION 12/26/2010  . HYPERTENSION 12/26/2010  . ALLERGIC RHINITIS 12/26/2010  . ASTHMA 12/26/2010  . HEADACHE 12/26/2010    Past Medical History:  Diagnosis Date  . Alcohol abuse   . Allergy   . Anxiety   . Asthma   . Asthma    [facility] 11/09/2020  . Depression     posibly bipolar disorder. Sees Dr [name] for psychiatric care and Dr [name] for therapy  . Depression    [facility] 11/09/2020  . Headache(784.0)    last migraine 2004  . Hypertension    never on meds   . Substance abuse (HCC)    [facility] 11/09/2020    Current Outpatient Medications  Medication Sig Dispense Refill  . albuterol (PROVENTIL) (2.5 MG/3ML) 0.083% nebulizer solution Inhale into the lungs.    Marland Kitchen buPROPion (WELLBUTRIN XL) 300 MG 24 hr tablet Take one tablet daily. 30 tablet 5  . clonazePAM (KLONOPIN) 0.5 MG tablet Take 1 tablet (0.5 mg total) by mouth 3 (three) times daily as needed for anxiety. 90 tablet 2  . escitalopram (LEXAPRO) 10 MG tablet Take one tablet daily. 30 tablet 5  . fluticasone (FLONASE) 50 MCG/ACT nasal spray Place 2 sprays into the nose daily. 48 g 1  . folic acid (FOLVITE) 1 MG tablet Take 1 tablet (1 mg total) by mouth daily. (Can be purchased from over the counter) :For Folic acid deficiency 30 tablet 0  . Multiple Vitamin (MULTIVITAMIN WITH MINERALS) TABS Take 1 tablet by mouth daily. For vitamin deficiency    . promethazine (PHENERGAN) 25 MG tablet Take 1 tablet (25 mg total) by mouth every 8 (eight) hours as needed for nausea or vomiting. 20 tablet 0  . topiramate (TOPAMAX) 25 MG tablet Take 25 mg by mouth 2 (two) times daily.    Marland Kitchen triamcinolone (NASACORT) 55 MCG/ACT nasal inhaler Place 2 sprays into the nose daily. For allergies 1 Inhaler  11  . Vitamin D, Ergocalciferol, (DRISDOL) 1.25 MG (50000 UNIT) CAPS capsule Take 50,000 Units by mouth every 7 (seven) days.     No current facility-administered medications for this visit.    Allergies  Allergen Reactions  . Acetaminophen Other (See Comments)    Makes LFT gets elevated Causes liver problems Liver issues  Causes liver problems Liver issues Makes LFT gets elevated Causes liver problems Liver issues  . Lithium Other (See Comments)    REACTION: no appetite unknown Other  reaction(s): Other unknown REACTION: no appetite unknown  . Codeine   . Morphine And Related     Social History   Socioeconomic History  . Marital status: Significant Other    Spouse name: Not on file  . Number of children: 2  . Years of education: Not on file  . Highest education level: Not on file  Occupational History  . Not on file  Tobacco Use  . Smoking status: Current Every Day Smoker    Packs/day: 0.50    Types: Cigarettes  . Smokeless tobacco: Never Used  . Tobacco comment: Using the E cigarette to help her quit & still smoking  Vaping Use  . Vaping Use: Never used  Substance and Sexual Activity  . Alcohol use: Yes    Comment: 1 glass of wine per day or less  . Drug use: No  . Sexual activity: Not on file  Other Topics Concern  . Not on file  Social History Narrative  . Not on file   Social Determinants of Health   Financial Resource Strain: Not on file  Food Insecurity: Not on file  Transportation Needs: Not on file  Physical Activity: Not on file  Stress: Not on file  Social Connections: Not on file  Intimate Partner Violence: Not on file    Review of Systems  Constitutional: Negative.   HENT: Negative.   Eyes: Negative.   Respiratory: Negative.   Cardiovascular: Negative.   Gastrointestinal: Positive for nausea and vomiting. Negative for abdominal pain, blood in stool, constipation, diarrhea, heartburn and melena.  Genitourinary: Negative.   Musculoskeletal: Negative.   Skin: Negative.   Neurological: Negative.   Endo/Heme/Allergies: Negative.   Psychiatric/Behavioral: Negative.   All other systems reviewed and are negative.   Objective   Vitals as reported by the patient: Today's Vitals   01/15/21 0917  Weight: 83 lb (37.6 kg)  Height: 5' (1.524 m)    Kelly Zuniga was seen today for emesis.  Diagnoses and all orders for this visit:  Bilious vomiting with nausea -     promethazine (PHENERGAN) 25 MG tablet; Take 1 tablet (25 mg total)  by mouth every 8 (eight) hours as needed for nausea or vomiting.   PLAN  Suspect viral gastroenteritis  Phenergan 25mg  po q8h PRN  If no improvement within 24 hours consider flu testing, and will withhold her from work on Friday  Patient encouraged to call clinic with any questions, comments, or concerns.   I discussed the assessment and treatment plan with the patient. The patient was provided an opportunity to ask questions and all were answered. The patient agreed with the plan and demonstrated an understanding of the instructions.   The patient was advised to call back or seek an in-person evaluation if the symptoms worsen or if the condition fails to improve as anticipated.  I provided 14 minutes of non-face-to-face time during this encounter.  Saturday, NP  [facility] at [facility]

## 2021-01-15 NOTE — Patient Instructions (Signed)
° ° ° °  If you have lab work done today you will be contacted with your lab results within the next 2 weeks.  If you have not heard from us then please contact us. The fastest way to get your results is to register for My Chart. ° ° °IF you received an x-ray today, you will receive an invoice from [facility]. Please contact [facility] at [phone] with questions or concerns regarding your invoice.  ° °IF you received labwork today, you will receive an invoice from [facility]. Please contact [facility] at [phone] with questions or concerns regarding your invoice.  ° °Our billing staff will not be able to assist you with questions regarding bills from these companies. ° °You will be contacted with the lab results as soon as they are available. The fastest way to get your results is to activate your My Chart account. Instructions are located on the last page of this paperwork. If you have not heard from us regarding the results in 2 weeks, please contact this office. °  ° ° ° °

## 2021-01-28 ENCOUNTER — Ambulatory Visit: Payer: Self-pay | Admitting: Adult Health

## 2021-02-09 ENCOUNTER — Emergency Department (HOSPITAL_COMMUNITY): Payer: No Typology Code available for payment source

## 2021-02-09 ENCOUNTER — Inpatient Hospital Stay (HOSPITAL_COMMUNITY)
Admission: EM | Admit: 2021-02-09 | Discharge: 2021-02-12 | DRG: 100 | Disposition: A | Discharge status: Home / Self Care | Payer: No Typology Code available for payment source | Attending: Internal Medicine | Admitting: Internal Medicine

## 2021-02-09 DIAGNOSIS — F10239 Alcohol dependence with withdrawal, unspecified: Secondary | ICD-10-CM | POA: Diagnosis present

## 2021-02-09 DIAGNOSIS — Y900 Blood alcohol level of less than 20 mg/100 ml: Secondary | ICD-10-CM | POA: Diagnosis present

## 2021-02-09 DIAGNOSIS — F419 Anxiety disorder, unspecified: Secondary | ICD-10-CM | POA: Diagnosis not present

## 2021-02-09 DIAGNOSIS — S0990XD Unspecified injury of head, subsequent encounter: Secondary | ICD-10-CM

## 2021-02-09 DIAGNOSIS — F1020 Alcohol dependence, uncomplicated: Secondary | ICD-10-CM | POA: Diagnosis present

## 2021-02-09 DIAGNOSIS — G4089 Other seizures: Secondary | ICD-10-CM | POA: Diagnosis not present

## 2021-02-09 DIAGNOSIS — G4700 Insomnia, unspecified: Secondary | ICD-10-CM | POA: Diagnosis present

## 2021-02-09 DIAGNOSIS — I69398 Other sequelae of cerebral infarction: Secondary | ICD-10-CM

## 2021-02-09 DIAGNOSIS — F329 Major depressive disorder, single episode, unspecified: Secondary | ICD-10-CM | POA: Diagnosis present

## 2021-02-09 DIAGNOSIS — H5509 Other forms of nystagmus: Secondary | ICD-10-CM | POA: Diagnosis present

## 2021-02-09 DIAGNOSIS — G9341 Metabolic encephalopathy: Secondary | ICD-10-CM | POA: Diagnosis present

## 2021-02-09 DIAGNOSIS — Z8249 Family history of ischemic heart disease and other diseases of the circulatory system: Secondary | ICD-10-CM

## 2021-02-09 DIAGNOSIS — G43909 Migraine, unspecified, not intractable, without status migrainosus: Secondary | ICD-10-CM | POA: Diagnosis present

## 2021-02-09 DIAGNOSIS — W1839XA Other fall on same level, initial encounter: Secondary | ICD-10-CM | POA: Diagnosis present

## 2021-02-09 DIAGNOSIS — F1910 Other psychoactive substance abuse, uncomplicated: Secondary | ICD-10-CM | POA: Diagnosis present

## 2021-02-09 DIAGNOSIS — J45909 Unspecified asthma, uncomplicated: Secondary | ICD-10-CM | POA: Diagnosis present

## 2021-02-09 DIAGNOSIS — Z79899 Other long term (current) drug therapy: Secondary | ICD-10-CM

## 2021-02-09 DIAGNOSIS — R569 Unspecified convulsions: Secondary | ICD-10-CM | POA: Diagnosis not present

## 2021-02-09 DIAGNOSIS — E876 Hypokalemia: Secondary | ICD-10-CM | POA: Diagnosis present

## 2021-02-09 DIAGNOSIS — R945 Abnormal results of liver function studies: Secondary | ICD-10-CM | POA: Diagnosis present

## 2021-02-09 DIAGNOSIS — H57059 Tonic pupil, unspecified eye: Secondary | ICD-10-CM | POA: Diagnosis present

## 2021-02-09 DIAGNOSIS — Y990 Civilian activity done for income or pay: Secondary | ICD-10-CM

## 2021-02-09 DIAGNOSIS — Z8041 Family history of malignant neoplasm of ovary: Secondary | ICD-10-CM

## 2021-02-09 DIAGNOSIS — Z888 Allergy status to other drugs, medicaments and biological substances status: Secondary | ICD-10-CM

## 2021-02-09 DIAGNOSIS — R7989 Other specified abnormal findings of blood chemistry: Secondary | ICD-10-CM | POA: Diagnosis present

## 2021-02-09 DIAGNOSIS — W19XXXA Unspecified fall, initial encounter: Secondary | ICD-10-CM

## 2021-02-09 DIAGNOSIS — I10 Essential (primary) hypertension: Secondary | ICD-10-CM | POA: Diagnosis present

## 2021-02-09 DIAGNOSIS — E871 Hypo-osmolality and hyponatremia: Secondary | ICD-10-CM | POA: Diagnosis not present

## 2021-02-09 DIAGNOSIS — Z886 Allergy status to analgesic agent status: Secondary | ICD-10-CM

## 2021-02-09 DIAGNOSIS — R55 Syncope and collapse: Secondary | ICD-10-CM

## 2021-02-09 DIAGNOSIS — Z885 Allergy status to narcotic agent status: Secondary | ICD-10-CM

## 2021-02-09 DIAGNOSIS — Y92512 Supermarket, store or market as the place of occurrence of the external cause: Secondary | ICD-10-CM

## 2021-02-09 DIAGNOSIS — F17210 Nicotine dependence, cigarettes, uncomplicated: Secondary | ICD-10-CM | POA: Diagnosis present

## 2021-02-09 DIAGNOSIS — Z20822 Contact with and (suspected) exposure to covid-19: Secondary | ICD-10-CM | POA: Diagnosis present

## 2021-02-09 DIAGNOSIS — S0003XA Contusion of scalp, initial encounter: Secondary | ICD-10-CM | POA: Diagnosis present

## 2021-02-09 DIAGNOSIS — D509 Iron deficiency anemia, unspecified: Secondary | ICD-10-CM | POA: Diagnosis present

## 2021-02-09 DIAGNOSIS — K838 Other specified diseases of biliary tract: Secondary | ICD-10-CM | POA: Diagnosis present

## 2021-02-09 DIAGNOSIS — E638 Other specified nutritional deficiencies: Secondary | ICD-10-CM | POA: Diagnosis present

## 2021-02-09 DIAGNOSIS — Z833 Family history of diabetes mellitus: Secondary | ICD-10-CM

## 2021-02-09 DIAGNOSIS — S0990XA Unspecified injury of head, initial encounter: Secondary | ICD-10-CM | POA: Diagnosis present

## 2021-02-09 DIAGNOSIS — F411 Generalized anxiety disorder: Secondary | ICD-10-CM | POA: Diagnosis present

## 2021-02-09 DIAGNOSIS — Y9389 Activity, other specified: Secondary | ICD-10-CM

## 2021-02-09 DIAGNOSIS — D539 Nutritional anemia, unspecified: Secondary | ICD-10-CM | POA: Diagnosis present

## 2021-02-09 DIAGNOSIS — H57052 Tonic pupil, left eye: Secondary | ICD-10-CM | POA: Diagnosis present

## 2021-02-09 HISTORY — DX: Unspecified convulsions: R56.9

## 2021-02-09 LAB — RAPID URINE DRUG SCREEN, HOSP PERFORMED
Amphetamines: NOT DETECTED
Barbiturates: NOT DETECTED
Benzodiazepines: NOT DETECTED
Cocaine: NOT DETECTED
Opiates: NOT DETECTED
Tetrahydrocannabinol: NOT DETECTED

## 2021-02-09 LAB — CK: Total CK: 125 U/L (ref 38–234)

## 2021-02-09 LAB — CBC WITH DIFFERENTIAL/PLATELET
Abs Immature Granulocytes: 0.03 10*3/uL (ref 0.00–0.07)
Basophils Absolute: 0.1 10*3/uL (ref 0.0–0.1)
Basophils Relative: 1 %
Eosinophils Absolute: 0.1 10*3/uL (ref 0.0–0.5)
Eosinophils Relative: 2 %
HCT: 34.5 % — ABNORMAL LOW (ref 36.0–46.0)
Hemoglobin: 11.1 g/dL — ABNORMAL LOW (ref 12.0–15.0)
Immature Granulocytes: 1 %
Lymphocytes Relative: 13 %
Lymphs Abs: 0.6 10*3/uL — ABNORMAL LOW (ref 0.7–4.0)
MCH: 33.7 pg (ref 26.0–34.0)
MCHC: 32.2 g/dL (ref 30.0–36.0)
MCV: 104.9 fL — ABNORMAL HIGH (ref 80.0–100.0)
Monocytes Absolute: 0.8 10*3/uL (ref 0.1–1.0)
Monocytes Relative: 15 %
Neutro Abs: 3.5 10*3/uL (ref 1.7–7.7)
Neutrophils Relative %: 68 %
Platelets: 191 10*3/uL (ref 150–400)
RBC: 3.29 MIL/uL — ABNORMAL LOW (ref 3.87–5.11)
RDW: 13.9 % (ref 11.5–15.5)
WBC: 5.1 10*3/uL (ref 4.0–10.5)
nRBC: 0 % (ref 0.0–0.2)

## 2021-02-09 LAB — LIPASE, BLOOD: Lipase: 59 U/L — ABNORMAL HIGH (ref 11–51)

## 2021-02-09 LAB — COMPREHENSIVE METABOLIC PANEL
ALT: 52 U/L — ABNORMAL HIGH (ref 0–44)
AST: 111 U/L — ABNORMAL HIGH (ref 15–41)
Albumin: 4.6 g/dL (ref 3.5–5.0)
Alkaline Phosphatase: 68 U/L (ref 38–126)
Anion gap: 14 (ref 5–15)
BUN: 16 mg/dL (ref 6–20)
CO2: 25 mmol/L (ref 22–32)
Calcium: 9.6 mg/dL (ref 8.9–10.3)
Chloride: 101 mmol/L (ref 98–111)
Creatinine, Ser: 0.74 mg/dL (ref 0.44–1.00)
GFR, Estimated: >60 mL/min (ref >60)
Glucose, Bld: 97 mg/dL (ref 70–99)
Potassium: 4.4 mmol/L (ref 3.5–5.1)
Sodium: 140 mmol/L (ref 135–145)
Total Bilirubin: 0.9 mg/dL (ref 0.3–1.2)
Total Protein: 7 g/dL (ref 6.5–8.1)

## 2021-02-09 LAB — TROPONIN I (HIGH SENSITIVITY)
Troponin I (High Sensitivity): 7 ng/L (ref <18)
Troponin I (High Sensitivity): 9 ng/L (ref <18)

## 2021-02-09 LAB — URINALYSIS, ROUTINE W REFLEX MICROSCOPIC
Bilirubin Urine: NEGATIVE
Glucose, UA: NEGATIVE mg/dL
Hgb urine dipstick: NEGATIVE
Ketones, ur: 20 mg/dL — AB
Leukocytes,Ua: NEGATIVE
Nitrite: NEGATIVE
Protein, ur: NEGATIVE mg/dL
Specific Gravity, Urine: 1.017 (ref 1.005–1.030)
pH: 7 (ref 5.0–8.0)

## 2021-02-09 LAB — CBG MONITORING, ED
Glucose-Capillary: 88 mg/dL (ref 70–99)
Glucose-Capillary: 102 mg/dL — ABNORMAL HIGH (ref 70–99)

## 2021-02-09 LAB — SARS CORONAVIRUS 2 (TAT 6-24 HRS): SARS Coronavirus 2: NEGATIVE

## 2021-02-09 LAB — HEPATITIS PANEL, ACUTE
HCV Ab: NONREACTIVE
Hep A IgM: NONREACTIVE
Hep B C IgM: NONREACTIVE
Hepatitis B Surface Ag: NONREACTIVE

## 2021-02-09 LAB — HIV ANTIBODY (ROUTINE TESTING W REFLEX): HIV Screen 4th Generation wRfx: NONREACTIVE

## 2021-02-09 LAB — LITHIUM LEVEL: Lithium Lvl: <0.06 mmol/L — ABNORMAL LOW (ref 0.60–1.20)

## 2021-02-09 LAB — ETHANOL: Alcohol, Ethyl (B): <10 mg/dL (ref <10)

## 2021-02-09 MED ORDER — THIAMINE HCL 100 MG PO TABS
100.0000 mg | ORAL_TABLET | Freq: Every day | ORAL | Status: DC
  Administered 2021-02-09 – 2021-02-12 (×4): 100 mg via ORAL
  Filled 2021-02-09 (×4): qty 1

## 2021-02-09 MED ORDER — ESCITALOPRAM OXALATE 10 MG PO TABS
10.0000 mg | ORAL_TABLET | Freq: Every morning | ORAL | Status: DC
  Administered 2021-02-10 – 2021-02-12 (×3): 10 mg via ORAL
  Filled 2021-02-09 (×3): qty 1

## 2021-02-09 MED ORDER — ALBUTEROL SULFATE HFA 108 (90 BASE) MCG/ACT IN AERS
2.0000 | INHALATION_SPRAY | RESPIRATORY_TRACT | Status: DC | PRN
  Filled 2021-02-09: qty 6.7

## 2021-02-09 MED ORDER — FOLIC ACID 1 MG PO TABS: 1.0000 mg | ORAL_TABLET | Freq: Every day | ORAL | Status: DC

## 2021-02-09 MED ORDER — LORAZEPAM 1 MG PO TABS
1.0000 mg | ORAL_TABLET | ORAL | Status: DC | PRN
  Administered 2021-02-10: 3 mg via ORAL
  Filled 2021-02-09: qty 3

## 2021-02-09 MED ORDER — THIAMINE HCL 100 MG/ML IJ SOLN: 100.0000 mg | Freq: Every day | INTRAMUSCULAR | Status: DC

## 2021-02-09 MED ORDER — LORATADINE 10 MG PO TABS
10.0000 mg | ORAL_TABLET | Freq: Every day | ORAL | Status: DC
  Administered 2021-02-10 – 2021-02-12 (×3): 10 mg via ORAL
  Filled 2021-02-09 (×3): qty 1

## 2021-02-09 MED ORDER — LORAZEPAM 2 MG/ML IJ SOLN
INTRAMUSCULAR | Status: AC
  Filled 2021-02-09: qty 2

## 2021-02-09 MED ORDER — BUPROPION HCL ER (XL) 150 MG PO TB24
300.0000 mg | ORAL_TABLET | Freq: Every morning | ORAL | Status: DC
  Administered 2021-02-10: 300 mg via ORAL
  Filled 2021-02-09: qty 2

## 2021-02-09 MED ORDER — SODIUM CHLORIDE 0.9 % IV SOLN
75.0000 mL/h | INTRAVENOUS | Status: DC
  Administered 2021-02-09 – 2021-02-11 (×2): 75 mL/h via INTRAVENOUS

## 2021-02-09 MED ORDER — FLUTICASONE PROPIONATE 50 MCG/ACT NA SUSP
1.0000 | Freq: Every day | NASAL | Status: DC
  Administered 2021-02-10: 1 via NASAL
  Filled 2021-02-09: qty 16

## 2021-02-09 MED ORDER — CLONAZEPAM 0.5 MG PO TABS
0.5000 mg | ORAL_TABLET | Freq: Three times a day (TID) | ORAL | Status: DC | PRN
Start: 2021-02-09 — End: 2021-02-12

## 2021-02-09 MED ORDER — LEVETIRACETAM 500 MG PO TABS
500.0000 mg | ORAL_TABLET | Freq: Two times a day (BID) | ORAL | Status: DC
Start: 2021-02-09 — End: 2021-02-12
  Administered 2021-02-09 – 2021-02-12 (×6): 500 mg via ORAL
  Filled 2021-02-09 (×6): qty 1

## 2021-02-09 MED ORDER — LORAZEPAM 2 MG/ML IJ SOLN
INTRAMUSCULAR | Status: AC
  Administered 2021-02-09: 1 mg
  Filled 2021-02-09: qty 1

## 2021-02-09 MED ORDER — IBUPROFEN 200 MG PO TABS
200.0000 mg | ORAL_TABLET | Freq: Four times a day (QID) | ORAL | Status: DC | PRN
  Administered 2021-02-09: 200 mg via ORAL
  Filled 2021-02-09: qty 1

## 2021-02-09 MED ORDER — MECLIZINE HCL 25 MG PO TABS
25.0000 mg | ORAL_TABLET | Freq: Once | ORAL | Status: AC
  Administered 2021-02-09: 25 mg via ORAL
  Filled 2021-02-09: qty 1

## 2021-02-09 MED ORDER — ONDANSETRON HCL 4 MG/2ML IJ SOLN
4.0000 mg | Freq: Four times a day (QID) | INTRAMUSCULAR | Status: DC | PRN
  Administered 2021-02-09: 4 mg via INTRAVENOUS
  Filled 2021-02-09: qty 2

## 2021-02-09 MED ORDER — ADULT MULTIVITAMIN W/MINERALS CH
1.0000 | ORAL_TABLET | Freq: Every day | ORAL | Status: DC
  Administered 2021-02-09 – 2021-02-12 (×4): 1 via ORAL
  Filled 2021-02-09 (×4): qty 1

## 2021-02-09 MED ORDER — MECLIZINE HCL 12.5 MG PO TABS
12.5000 mg | ORAL_TABLET | Freq: Two times a day (BID) | ORAL | Status: DC
  Administered 2021-02-09 – 2021-02-12 (×6): 12.5 mg via ORAL
  Filled 2021-02-09 (×8): qty 1

## 2021-02-09 MED ORDER — LEVETIRACETAM IN NACL 1000 MG/100ML IV SOLN
1000.0000 mg | Freq: Once | INTRAVENOUS | Status: AC
  Administered 2021-02-09: 1000 mg via INTRAVENOUS
  Filled 2021-02-09: qty 100

## 2021-02-09 MED ORDER — ONDANSETRON HCL 4 MG/2ML IJ SOLN
4.0000 mg | Freq: Once | INTRAMUSCULAR | Status: AC
  Administered 2021-02-09: 4 mg via INTRAVENOUS
  Filled 2021-02-09: qty 2

## 2021-02-09 MED ORDER — ENOXAPARIN SODIUM 40 MG/0.4ML ~~LOC~~ SOLN
40.0000 mg | SUBCUTANEOUS | Status: DC
  Administered 2021-02-10 – 2021-02-11 (×2): 40 mg via SUBCUTANEOUS
  Filled 2021-02-09 (×3): qty 0.4

## 2021-02-09 MED ORDER — LORAZEPAM 2 MG/ML IJ SOLN
1.0000 mg | INTRAMUSCULAR | Status: DC | PRN
Start: 2021-02-09 — End: 2021-02-12
  Administered 2021-02-09 (×2): 4 mg via INTRAVENOUS
  Administered 2021-02-10: 2 mg via INTRAVENOUS
  Administered 2021-02-10: 1 mg via INTRAVENOUS
  Administered 2021-02-10: 2 mg via INTRAVENOUS
  Filled 2021-02-09: qty 2
  Filled 2021-02-09 (×3): qty 1

## 2021-02-09 MED ORDER — FOLIC ACID 1 MG PO TABS
1.0000 mg | ORAL_TABLET | Freq: Every day | ORAL | Status: DC
  Administered 2021-02-10 – 2021-02-12 (×3): 1 mg via ORAL
  Filled 2021-02-09 (×3): qty 1

## 2021-02-09 MED ORDER — TOPIRAMATE 25 MG PO TABS
25.0000 mg | ORAL_TABLET | Freq: Two times a day (BID) | ORAL | Status: DC
  Administered 2021-02-09 – 2021-02-12 (×6): 25 mg via ORAL
  Filled 2021-02-09 (×6): qty 1

## 2021-02-09 MED ORDER — TOPIRAMATE 25 MG PO TABS: 25.0000 mg | ORAL_TABLET | Freq: Every morning | ORAL | Status: DC

## 2021-02-09 NOTE — Consult Note (Addendum)
Neurology Consultation Reason for Consult: new onset seizure Referring Physician: ED  CC: seizure  History is obtained from: patient and husband.  HPI: Kelly Zuniga is a 56 y.o. female who was at work today when she had a passing out spell with shaking jerking activity and fell backward, hitting her head. She was feeling dizzy and nauseated before the event. In fact, she's been nauseated for a few days. The ED PA earlier indicated that Kelly Zuniga endorsed a two-drinks-per-day alcohol history, but was adamant that this amount was unchanged. On the other hand, with a 3-day history of nausea, I'm concerned. When I asked her husband about when she started trembling, he mentions that he's noticed this "all day." Kelly Zuniga also has extremely brisk reflexes. She complains of headache, but CT and MRI were OK, showing no acute pathology or blood products. She denies any pain with head/neck movements. There is a history of migraines, for which she takes topamax, and her pain is occipital on the right, radiating forward to the right periorbital and forehead area. She is being admitted to medicine for further evaluation and management, and neurology is consulted for the seizures. I advised a keppra load and 500 mg bid maintenance, and that the EEG could wait until the AM.   ROS: A 14 point ROS was performed and is negative except as noted in the HPI.    Past Medical History:  Diagnosis Date  . Alcohol abuse   . Allergy   . Anxiety   . Asthma   . Asthma    [facility] 11/09/2020  . Depression    posibly bipolar disorder. Sees Dr [name] for psychiatric care and Dr [name] for therapy  . Depression    [facility] 11/09/2020  . Headache(784.0)    last migraine 2004  . Hypertension    never on meds   . Substance abuse (HCC)    [facility] 11/09/2020    Family History  Problem Relation Age of Onset  . Coronary artery disease Other        1st degree relative female <50  . Depression Other         Family Hx  . Diabetes Other        Family Hx 1st degree relative  . Ovarian cancer Other        Family Hx   . Healthy Mother   . Diabetes Father   . Gout Father   . Hypertension Father   . Heart failure Father   . Cancer Father     Social History:  reports that she has been smoking cigarettes. She has been smoking about 0.50 packs per day. She has never used smokeless tobacco. She reports current alcohol use. She reports that she does not use drugs.  Exam: Current vital signs: BP (!) 112/55   Pulse 84   Temp 98.1 F (36.7 C) (Oral)   Resp 16   SpO2 96%  Vital signs in last 24 hours: Temp:  [98.1 F (36.7 C)] 98.1 F (36.7 C) (02/20 1245) Pulse Rate:  [84-96] 84 (02/20 1700) Resp:  [14-28] 16 (02/20 1700) BP: (112-169)/(55-104) 112/55 (02/20 1700) SpO2:  [95 %-97 %] 96 % (02/20 1700)   Physical Exam  Constitutional: Appears well-developed and well-nourished.  Psych: Affect anxious Eyes: No scleral injection HENT: No OP obstrucion MSK: no joint deformities.  Cardiovascular: Normal rate and regular rhythm.  Respiratory: Effort normal, non-labored breathing GI: Soft.  No distension. There is no tenderness.  Skin: WDI  Neuro: Mental Status: Patient is awake, alert, oriented to person, place, month, year, and situation. Patient is able to give a clear and coherent history. Improved from earlier, by report. No signs of aphasia or neglect. Cranial Nerves: II: Visual Fields are full. Pupils are unequal, left > right, and left minimally reactive (h/o Holmes-Adie pupil). III,IV, VI: EOMI without ptosis or diplopia.  V: Facial sensation is symmetric to temperature VII: Facial movement is symmetric.  VIII: hearing is intact to voice X: Uvula elevates symmetrically XI: deferred XII: tongue is midline without atrophy or fasciculations. No tongue-biting observed or endorsed. Motor: Tone is normal. Bulk is normal. 5/5 strength was present in all four extremities.  Fine, fast frequency tremor noted bilaterally. Sensory: Sensation is symmetric to light touch and temperature in the arms and legs. Deep Tendon Reflexes: 3+ and symmetric in the biceps, brachioradialis, patellae, and 2+ and symmetric in the ankles. Plantars: Toes are downgoing bilaterally. Cerebellar: FNF and FTN are intact bilaterally  I have reviewed labs in epic and the results pertinent to this consultation are: Ketones in urine. Alcohol < 10. Elevated AST and ALT. LDL 131 and total cholesterol 232. BMP OK. CBC shows anemia. 191k platelets.  I have reviewed the images obtained: CT and MRI are OK.  Impression:  1) new onset seizure, suspicious for ETOH withdrawal given constellation of supporting data noted above (e.g., ketonuria suggest dehydration and/or malnutrition, and she admits to 3d of nausea, is tremulous/hyperreflexic, etc.) 2) History of vertigo; I am doubtful of vertebrobasilar insufficiency with drop attack and do not think that CTA of the head and neck are necessary. 3) History of migraine headaches, now with right-sided predominant headache and ongoing nausea. 4) Fall with head injury, raising the possibility of post-concussive migraine, or at least a migraine triggered by the blow. 5) Hyperreflexia and tremor, likely due to #1.   Recommendations: 1) Continue keppra 500 mg bid for 3-6 months until seizure free. 2) IV hydration 3) Consider alcohol withdrawal protocol/observation 4) EEG to be done in the morning 5) Doubt CTA of the head and neck will be required, but could consider 6) Increase topiramate dose by 25 mg and treat headache symptomatically (e.g., 30 mg iv ketorolac x 1) 7) Antacid/ulcer prophylaxis 8) May need to cut dose or switch wellbutrin due to its tendency to lower the seizure threshold.  Neurology will follow along. Thank you.  Meredeth Ide, MD

## 2021-02-09 NOTE — Discharge Instructions (Signed)
Do not drive until you are cleared by neurology as you may have had a seizure today, and this would put you and others at risk.

## 2021-02-09 NOTE — ED Triage Notes (Signed)
Pt arrives from work via [employer] after having an witnessed fall and her co workers reports shaking for about 10 seconds and then witnessed another episode that last the same. She does have dried blood to back of head with hematoma. She arrives to ED alert and ox4.

## 2021-02-09 NOTE — ED Provider Notes (Cosign Needed Addendum)
[facility] Provider Note   CSN: [id number] Arrival date & time: 02/09/21  1237     History Chief Complaint  Patient presents with  . Loss of Consciousness    [facility] is a 55 y.o. female with a past medical history of alcohol abuse, presenting to the ED with a chief complaint of syncope.  Patient states that she was at work for about 4 to 5 hours using "a price tag gun" which was causing her hands to cramp up.  She has been dealing with vertigo symptoms for the past 3 to 4 days.  Describes it as dizziness and feeling like the room is spinning around her and has trouble ambulating without holding onto the wall.  She has not been evaluated for this.  This is new for her in the past 3 to 4 days.  She had sensation of vertigo while at work, does not recall what happened next.  States that she was told that she syncopized for several minutes and when she woke up there was EMS surrounding her.  She states that she continues to have dizziness and tremors which she states are because her hands were hurting.  States that the vertigo worsens when she ambulates or stands up.  Denies any vision changes.  Does have a headache to the posterior part of her head after she fell to the floor.  Reports nausea but denies any vomiting.  No neck pain or stiffness, back pain, history of seizures in the past, chest pain, shortness of breath, anticoagulant use, abdominal pain.  States that she drinks wine on a daily basis but has never experienced alcohol withdrawal seizures in the past.  HPI     Past Medical History:  Diagnosis Date  . Alcohol abuse   . Allergy   . Anxiety   . Asthma   . Asthma    [employer]rees[employer] 11/09/2020  . Depression    posibly bipolar disorder. Sees Dr [name] for psychiatric care and Dr [name] for therapy  . Depression    [employer] 11/09/2020  . Headache(784.0)    last migraine 2004  . Hypertension    never on meds   . Substance  abuse (HCC)    [employer] 11/09/2020    Patient Active Problem List   Diagnosis Date Noted  . Substance abuse (HCC) 02/18/2013  . Alcohol dependence (HCC) 02/18/2013  . Headache disorder 02/26/2012  . 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 ANXIETY 12/26/2010  . DEPRESSION 12/26/2010  . HYPERTENSION 12/26/2010  . ALLERGIC RHINITIS 12/26/2010  . ASTHMA 12/26/2010  . HEADACHE 12/26/2010    Past Surgical History:  Procedure Laterality Date  . benign cyst     removed from left cheek  . TONSILECTOMY, ADENOIDECTOMY, BILATERAL MYRINGOTOMY AND TUBES       OB History   No obstetric history on file.     Family History  Problem Relation Age of Onset  . Coronary artery disease Other        1st degree relative female <50  . Depression Other        Family Hx  . Diabetes Other        Family Hx 1st degree relative  . Ovarian cancer Other        Family Hx   . Healthy Mother   . Diabetes Father   . Gout Father   . Hypertension Father   . Heart failure Father   . Cancer Father  Social History   Tobacco Use  . Smoking status: Current Every Day Smoker    Packs/day: 0.50    Types: Cigarettes  . Smokeless tobacco: Never Used  . Tobacco comment: Using the E cigarette to help her quit & still smoking  Vaping Use  . Vaping Use: Never used  Substance Use Topics  . Alcohol use: Yes    Comment: 1 glass of wine per day or less  . Drug use: No    Home Medications Prior to Admission medications   Medication Sig Start Date End Date Taking? Authorizing Provider  albuterol (PROVENTIL) (2.5 MG/3ML) 0.083% nebulizer solution Take 2.5 mg by nebulization every 6 (six) hours as needed for wheezing or shortness of breath. 09/13/15  Yes [provider]  albuterol (VENTOLIN HFA) 108 (90 Base) MCG/ACT inhaler Inhale 2 puffs into the lungs See admin instructions. Inhale 2 puffs into the lungs every 4-6 hours as needed for shortness of breath or wheezing 10/10/20  Yes [provider]   BIOTIN PO Take 1 tablet by mouth in the morning.   Yes [provider]  buPROPion (WELLBUTRIN XL) 300 MG 24 hr tablet Take one tablet daily. Patient taking differently: Take 300 mg by mouth in the morning. 12/09/20  Yes Mozingo, Thereasa Solo, NP  clonazePAM (KLONOPIN) 0.5 MG tablet Take 1 tablet (0.5 mg total) by mouth 3 (three) times daily as needed for anxiety. 11/11/20  Yes Mozingo, Thereasa Solo, NP  escitalopram (LEXAPRO) 10 MG tablet Take one tablet daily. Patient taking differently: Take 10 mg by mouth in the morning. 12/09/20  Yes Mozingo, Thereasa Solo, NP  fexofenadine (ALLEGRA) 180 MG tablet Take 180 mg by mouth in the morning.   Yes [provider]  Multiple Vitamins-Minerals (MULTI ADULT GUMMIES) CHEW Chew 2 tablets by mouth in the morning.   Yes [provider]  naproxen sodium (ALEVE) 220 MG tablet Take 220 mg by mouth 2 (two) times daily as needed (for mild pain).   Yes [provider]  promethazine (PHENERGAN) 25 MG tablet Take 1 tablet (25 mg total) by mouth every 8 (eight) hours as needed for nausea or vomiting. 01/15/21  Yes [name], NP  topiramate (TOPAMAX) 25 MG tablet Take 25 mg by mouth in the morning.   Yes [provider]  triamcinolone (NASACORT) 55 MCG/ACT nasal inhaler Place 2 sprays into the nose daily. For allergies Patient taking differently: Place 2 sprays into the nose in the morning. 02/23/13  Yes Armandina Stammer I, NP  fluticasone (FLONASE) 50 MCG/ACT nasal spray Place 2 sprays into the nose daily. Patient not taking: No sig reported 07/11/13   Nelwyn Salisbury, MD  folic acid (FOLVITE) 1 MG tablet Take 1 tablet (1 mg total) by mouth daily. (Can be purchased from over the counter) :For Folic acid deficiency Patient not taking: No sig reported 02/23/13   Armandina Stammer I, NP  Multiple Vitamin (MULTIVITAMIN WITH MINERALS) TABS Take 1 tablet by mouth daily. For vitamin deficiency Patient not taking: No sig reported  02/23/13   Armandina Stammer I, NP    Allergies    Acetaminophen, Lithium, Promethazine-codeine, Codeine, and Morphine and related  Review of Systems   Review of Systems  Constitutional: Negative for appetite change, chills and fever.  HENT: Negative for ear pain, rhinorrhea, sneezing and sore throat.   Eyes: Negative for photophobia and visual disturbance.  Respiratory: Negative for cough, chest tightness, shortness of breath and wheezing.   Cardiovascular: Negative for chest pain  and palpitations.  Gastrointestinal: Positive for nausea. Negative for abdominal pain, blood in stool, constipation, diarrhea and vomiting.  Genitourinary: Negative for dysuria, hematuria and urgency.  Musculoskeletal: Negative for myalgias.  Skin: Negative for rash.  Neurological: Positive for tremors, syncope and headaches. Negative for dizziness, weakness and light-headedness.    Physical Exam Updated Vital Signs BP (!) 149/87   Pulse 87   Temp 98.1 F (36.7 C) (Oral)   Resp 19   SpO2 96%   Physical Exam Vitals and nursing note reviewed.  Constitutional:      General: She is not in acute distress.    Appearance: She is well-developed and well-nourished.  HENT:     Head: Normocephalic and atraumatic.     Nose: Nose normal.  Eyes:     General: No scleral icterus.       Right eye: No discharge.        Left eye: No discharge.     Extraocular Movements:     Right eye: Nystagmus present.     Left eye: Nystagmus present.     Conjunctiva/sclera: Conjunctivae normal.  Cardiovascular:     Rate and Rhythm: Normal rate and regular rhythm.     Pulses: Intact distal pulses.     Heart sounds: Normal heart sounds. No murmur heard. No friction rub. No gallop.   Pulmonary:     Effort: Pulmonary effort is normal. No respiratory distress.     Breath sounds: Normal breath sounds.  Abdominal:     General: Bowel sounds are normal. There is no distension.     Palpations: Abdomen is soft.     Tenderness: There  is no abdominal tenderness. There is no guarding.  Musculoskeletal:        General: No edema. Normal range of motion.     Cervical back: Normal range of motion and neck supple.  Skin:    General: Skin is warm and dry.     Findings: No rash.  Neurological:     Mental Status: She is alert.     Motor: No abnormal muscle tone.     Coordination: Coordination normal.     Comments: Horizontal nystagmus noted bilaterally. Pupils reactive. No facial asymmetry noted. Cranial nerves appear grossly intact. Sensation intact to light touch on face, BUE and BLE. Strength 5/5 in BUE and BLE. Normal finger-to-nose coordination bilaterally.  Psychiatric:        Mood and Affect: Mood and affect normal.     ED Results / Procedures / Treatments   Labs (all labs ordered are listed, but only abnormal results are displayed) Labs Reviewed  COMPREHENSIVE METABOLIC PANEL - Abnormal; Notable for the following components:      Result Value   AST 111 (*)    ALT 52 (*)    All other components within normal limits  CBC WITH DIFFERENTIAL/PLATELET - Abnormal; Notable for the following components:   RBC 3.29 (*)    Hemoglobin 11.1 (*)    HCT 34.5 (*)    MCV 104.9 (*)    Lymphs Abs 0.6 (*)    All other components within normal limits  CK  CBG MONITORING, ED  TROPONIN I (HIGH SENSITIVITY)  TROPONIN I (HIGH SENSITIVITY)    EKG EKG Interpretation  Date/Time:  Sunday February 09 2021 12:44:33 EST Ventricular Rate:  92 PR Interval:    QRS Duration: 89 QT Interval:  388 QTC Calculation: 480 R Axis:   -30 Text Interpretation: Sinus rhythm Left axis deviation RSR' in V1  or V2, probably normal variant Probable anteroseptal infarct, old no prior for comparison Confirmed by Alona BeneLong, Joshua [phone]([id number]) on 02/09/2021 2:00:50 PM   Radiology CT Head Wo Contrast  Result Date: 02/09/2021 CLINICAL DATA:  Dizziness for a few days. Status post syncope and possible seizure today. EXAM: CT HEAD WITHOUT CONTRAST TECHNIQUE:  Contiguous axial images were obtained from the base of the skull through the vertex without intravenous contrast. COMPARISON:  Head CT 09/15/2020. FINDINGS: Brain: No evidence of acute infarction, hemorrhage, hydrocephalus, extra-axial collection or mass lesion/mass effect. Cortical atrophy is unchanged. Vascular: No hyperdense vessel or unexpected calcification. Skull: Intact.  No focal lesion. Sinuses/Orbits: Mild mucosal thickening inferior aspect of the left maxillary sinus is noted. Otherwise negative. Other: Scalp contusion on the right is seen. IMPRESSION: No acute intracranial normality. Cortical atrophy. Scalp contusion on the right. Electronically Signed   By: Drusilla Kannerhomas  Dalessio M.D.   On: 02/09/2021 14:17    Procedures Procedures   Medications Ordered in ED Medications  ondansetron (ZOFRAN) injection 4 mg (4 mg Intravenous Given 02/09/21 1342)  meclizine (ANTIVERT) tablet 25 mg (25 mg Oral Given 02/09/21 1455)    ED Course  I have reviewed the triage vital signs and the nursing notes.  Pertinent labs & imaging results that were available during my care of the patient were reviewed by me and considered in my medical decision making (see chart for details).  Clinical Course as of 02/09/21 1503  Sun Feb 09, 2021  1352 Hemoglobin(!): 11.1 [HK]  1352 Glucose-Capillary: 88 [HK]  1420 CK Total: 125 [HK]  1420 Troponin I (High Sensitivity): 7 [HK]  1448 CT Head Wo Contrast No abnormalities, shows chronic atrophy. [HK]    Clinical Course User Index [HK] Dietrich PatesKhatri, Hina, PA-C   MDM Rules/Calculators/A&P                          55 year old female with past medical history of alcohol abuse presenting to the ED with a chief complaint of syncope.  She was at work for about 4 to 5 hours using a price tag gun which caused her hands to cramp up.  She admits that for the past 3 to 4 days she has had vertiginous symptoms, feel like the room is spinning around her to the point where she has trouble  ambulating without holding onto the wall.  This is new for her in the past 3 to 4 days.  She had the same sensation while at work and then syncopized.  States that she woke up with EMS surrounding her.  She continues to have dizziness and tremors.  EMS states that coworker says she had shaking of her body for a few seconds.  She denies history of seizures in the past.  She does drink alcohol daily and continues to do so and has never experienced alcohol withdrawal symptoms in the past.  Does report a headache back of her head where she has a hard time moving.  Denies any neck stiffness, vision changes, numbness in arms or legs, vomiting, back pain or fever.  On exam patient with no neurological deficits.  No numbness or weakness on exam.  She has not ambulated since her syncopal episode.  Normal finger-to-nose coordination bilaterally.  She does have horizontal nystagmus noted.  No tenderness of the C, T or L-spine on exam.  Will obtain EKG, lab work including CK as she reports muscle cramping, CT of the head and reassess after antiemetics. Of  note, patient with complaints of severe dizziness when I sat her up to remove her c-collar, at which time she requested antiemetic. I am unsure if this episode she experienced was actually a seizure.  Lab work significant for CBC without any abnormalities, hemoglobin is around her baseline. CK is normal here at 125. CMP without any signs of AKI or electrolyte derangement.  She has slight elevation in her LFTs which I feel is from her alcohol abuse. Troponin is 7. CBG is normal. EKG without any ischemic changes, no STEMI or significant arrythmias.   2:51 PM  CT head without any acute abnormalities, masses or hemorrhage. On recheck patient reports improvement in her nausea with Zofran although still endorses vertiginous symptoms.  Will attempt to treat with meclizine and obtain MRI to further assess for emergent neurological cause of her severe vertigo such as  stroke that was not apparent on CT.  Patient is agreeable to the plan.  She endorses that she would like to ultimately go home and "do not want to be in the [facility] if I don't have to."  Informed her that we can make this decision after we have reassessed her after meclizine and MRI returns and she agrees.  If normal MRI and improvement in vertigo, I have provided her with an ambulatory referral to neurology.  Care handed off to oncoming team pending reassessment and ambulation trial after meclizine and MRI result.   All imaging, if done today, including plain films, CT scans, and ultrasounds, independently reviewed by me, and interpretations confirmed via formal radiology reads.  Portions of this note were generated with Scientist, clinical (histocompatibility and immunogenetics). Dictation errors may occur despite best attempts at proofreading.  Final Clinical Impression(s) / ED Diagnoses Final diagnoses:  Syncope, unspecified syncope type    Rx / DC Orders ED Discharge Orders         Ordered    Ambulatory referral to Neurology       Comments: An appointment is requested in approximately: 1 week   02/09/21 1502            Dietrich Pates, PA-C 02/09/21 1504    Long, Arlyss Repress, MD 02/10/21 [phone]

## 2021-02-09 NOTE — ED Notes (Signed)
Patient transported to MRI 

## 2021-02-09 NOTE — ED Notes (Signed)
Patient transported to CT 

## 2021-02-09 NOTE — ED Notes (Signed)
Admitting at bedside 

## 2021-02-09 NOTE — H&P (Addendum)
History and Physical    Kelly Zuniga VPX:[id number] DOB: 1966-01-25 DOA: 02/09/2021  PCP: [name], NP (Confirm with patient/family/NH records and if not entered, this has to be entered at [facility] point of entry) Patient coming from: home/work  I have personally briefly reviewed patient's old medical records in [facility]  Chief Complaint: syncope  HPI: Kelly Zuniga is a 56 y.o. female with medical history significant of alcohol abuse, astma,anxiety/depression, HTN reprts 3-4 days of room spinning dizziness. At work today she had continued dizziness, hand cramping and weakness. She had a brief episode of LOC falling and striking posterior occiput. Coworkers witness some whole body shaking lasting several seconds. EMS activated and patient transported to [facility] for evaluation. .  ED Course: T 98.1  112/55  84  16. ED-PA exam notable for horizontal nystagmus bilaterally. CT head w/o acute findings, MRI brain w/o acute findings. Lab reveals AST 111, ALT 52 (previous elevastion recorded at outpatient evaluation), CK 125 (nl), Troponin 7, CBC nl. In ED when she moved to sitting position she had increased dizziness along with nausea. On return from MRI patient had a witnessed event where she was unresponsive, had abnormal movement, lateral gaze w/o generalized tonic-clonic movement. She remained post-ictal for 1 hr. She was not considered to be at her baseline. Neurology was consulted by ED-PA and Keppra load and dosing recommended along with admission due to persistent mental status change.   Review of Systems: As per HPI otherwise 10 point review of systems negative.    Past Medical History:  Diagnosis Date  . Alcohol abuse   . Allergy   . Anxiety   . Asthma   . Asthma    Phreesia 11/09/2020  . Depression    posibly bipolar disorder. Sees Dr [name] for psychiatric care and Dr [name] for therapy  . Depression    Phreesia 11/09/2020  . Headache(784.0)    last  migraine 2004  . Hypertension    never on meds   . Substance abuse (HCC)    Phreesia 11/09/2020    Past Surgical History:  Procedure Laterality Date  . benign cyst     removed from left cheek  . TONSILECTOMY, ADENOIDECTOMY, BILATERAL MYRINGOTOMY AND TUBES      Soc Hx -  Lives with SO x 6 years. Works in Therapist, music. Reports she has not been drinking.   reports that she has been smoking cigarettes. She has been smoking about 0.50 packs per day. She has never used smokeless tobacco. She reports current alcohol use. She reports that she does not use drugs.  Allergies  Allergen Reactions  . Acetaminophen Other (See Comments)    Makes LFT get elevated and causes liver problems/issues   . Lithium Other (See Comments)    Causes the patient to have no appetite  . Promethazine-Codeine Nausea And Vomiting and Other (See Comments)    Any codeine-based cough syrups cause N/V  . Codeine Nausea And Vomiting  . Morphine And Related Nausea And Vomiting    Family History  Problem Relation Age of Onset  . Coronary artery disease Other        1st degree relative female <50  . Depression Other        Family Hx  . Diabetes Other        Family Hx 1st degree relative  . Ovarian cancer Other        Family Hx   . Healthy Mother   . Diabetes Father   .  Gout Father   . Hypertension Father   . Heart failure Father   . Cancer Father      Prior to Admission medications   Medication Sig Start Date End Date Taking? Authorizing Provider  albuterol (PROVENTIL) (2.5 MG/3ML) 0.083% nebulizer solution Take 2.5 mg by nebulization every 6 (six) hours as needed for wheezing or shortness of breath. 09/13/15  Yes [provider]  albuterol (VENTOLIN HFA) 108 (90 Base) MCG/ACT inhaler Inhale 2 puffs into the lungs See admin instructions. Inhale 2 puffs into the lungs every 4-6 hours as needed for shortness of breath or wheezing 10/10/20  Yes [provider]  BIOTIN PO Take 1 tablet by  mouth in the morning.   Yes [provider]  buPROPion (WELLBUTRIN XL) 300 MG 24 hr tablet Take one tablet daily. Patient taking differently: Take 300 mg by mouth in the morning. 12/09/20  Yes Mozingo, Thereasa Solo, NP  clonazePAM (KLONOPIN) 0.5 MG tablet Take 1 tablet (0.5 mg total) by mouth 3 (three) times daily as needed for anxiety. 11/11/20  Yes Mozingo, Thereasa Solo, NP  escitalopram (LEXAPRO) 10 MG tablet Take one tablet daily. Patient taking differently: Take 10 mg by mouth in the morning. 12/09/20  Yes Mozingo, Thereasa Solo, NP  fexofenadine (ALLEGRA) 180 MG tablet Take 180 mg by mouth in the morning.   Yes [provider]  Multiple Vitamins-Minerals (MULTI ADULT GUMMIES) CHEW Chew 2 tablets by mouth in the morning.   Yes [provider]  naproxen sodium (ALEVE) 220 MG tablet Take 220 mg by mouth 2 (two) times daily as needed (for mild pain).   Yes [provider]  promethazine (PHENERGAN) 25 MG tablet Take 1 tablet (25 mg total) by mouth every 8 (eight) hours as needed for nausea or vomiting. 01/15/21  Yes [name], NP  topiramate (TOPAMAX) 25 MG tablet Take 25 mg by mouth in the morning.   Yes [provider]  triamcinolone (NASACORT) 55 MCG/ACT nasal inhaler Place 2 sprays into the nose daily. For allergies Patient taking differently: Place 2 sprays into the nose in the morning. 02/23/13  Yes Armandina Stammer I, NP  fluticasone (FLONASE) 50 MCG/ACT nasal spray Place 2 sprays into the nose daily. Patient not taking: No sig reported 07/11/13   Nelwyn Salisbury, MD  folic acid (FOLVITE) 1 MG tablet Take 1 tablet (1 mg total) by mouth daily. (Can be purchased from over the counter) :For Folic acid deficiency Patient not taking: No sig reported 02/23/13   Armandina Stammer I, NP  Multiple Vitamin (MULTIVITAMIN WITH MINERALS) TABS Take 1 tablet by mouth daily. For vitamin deficiency Patient not taking: No sig reported 02/23/13   Armandina Stammer I, NP     Physical Exam: Vitals:   02/09/21 1430 02/09/21 1530 02/09/21 1630 02/09/21 1700  BP: (!) 149/87 (!) 134/94 (!) 157/104 (!) 112/55  Pulse: 87 86 95 84  Resp: 19 20 (!) 28 16  Temp:      TempSrc:      SpO2: 96% 97% 96% 96%     Vitals:   02/09/21 1430 02/09/21 1530 02/09/21 1630 02/09/21 1700  BP: (!) 149/87 (!) 134/94 (!) 157/104 (!) 112/55  Pulse: 87 86 95 84  Resp: 19 20 (!) 28 16  Temp:      TempSrc:      SpO2: 96% 97% 96% 96%   General:  Very slender woman in no distress Head - small superficial laceration right parietal scalp. Eyes: Left pupil  1-2 mm larger then right pupil, both reactive, lids and conjunctivae normal ENMT: Mucous membranes are moist. Posterior pharynx clear of any exudate or lesions.Normal dentition.  Neck: normal, supple, no masses, no thyromegaly Respiratory: clear to auscultation bilaterally, no wheezing, no crackles. Normal respiratory effort. No accessory muscle use.  Cardiovascular: Regular rate and rhythm, no murmurs / rubs / gallops. No extremity edema. 2+ pedal pulses. No carotid bruits.  Abdomen: no tenderness, no masses palpated. No hepatosplenomegaly. Bowel sounds positive.  Musculoskeletal: no clubbing / cyanosis. No joint deformity upper and lower extremities. Good ROM, no contractures. Normal muscle tone.  Skin: no rashes, lesions, ulcers. No induration Neurologic: CN 2-12: nl facial symmetry and movement, EOMI, vision grossly nl.. Sensation intact, DTR normal. Strength 5/5 in all 4.  Psychiatric: Normal judgment and insight. Alert and oriented x 3. Normal mood.     Labs on Admission: I have personally reviewed following labs and imaging studies  CBC: Recent Labs  Lab 02/09/21 1245  WBC 5.1  NEUTROABS 3.5  HGB 11.1*  HCT 34.5*  MCV 104.9*  PLT 191   Basic Metabolic Panel: Recent Labs  Lab 02/09/21 1245  NA 140  K 4.4  CL 101  CO2 25  GLUCOSE 97  BUN 16  CREATININE 0.74  CALCIUM 9.6   GFR: CrCl cannot be  calculated (Unknown ideal weight.). Liver Function Tests: Recent Labs  Lab 02/09/21 1245  AST 111*  ALT 52*  ALKPHOS 68  BILITOT 0.9  PROT 7.0  ALBUMIN 4.6   Recent Labs  Lab 02/09/21 1633  LIPASE 59*   No results for input(s): AMMONIA in the last 168 hours. Coagulation Profile: No results for input(s): INR, PROTIME in the last 168 hours. Cardiac Enzymes: Recent Labs  Lab 02/09/21 1245  CKTOTAL 125   BNP (last 3 results) No results for input(s): PROBNP in the last 8760 hours. HbA1C: No results for input(s): HGBA1C in the last 72 hours. CBG: Recent Labs  Lab 02/09/21 1245 02/09/21 1718  GLUCAP 88 102*   Lipid Profile: No results for input(s): CHOL, HDL, LDLCALC, TRIG, CHOLHDL, LDLDIRECT in the last 72 hours. Thyroid Function Tests: No results for input(s): TSH, T4TOTAL, FREET4, T3FREE, THYROIDAB in the last 72 hours. Anemia Panel: No results for input(s): VITAMINB12, FOLATE, FERRITIN, TIBC, IRON, RETICCTPCT in the last 72 hours. Urine analysis:    Component Value Date/Time   COLORURINE YELLOW 02/09/2021 1541   APPEARANCEUR CLEAR 02/09/2021 1541   LABSPEC 1.017 02/09/2021 1541   PHURINE 7.0 02/09/2021 1541   GLUCOSEU NEGATIVE 02/09/2021 1541   HGBUR NEGATIVE 02/09/2021 1541   BILIRUBINUR NEGATIVE 02/09/2021 1541   BILIRUBINUR n 01/27/2011 0000   KETONESUR 20 (A) 02/09/2021 1541   PROTEINUR NEGATIVE 02/09/2021 1541   UROBILINOGEN 0.2 01/27/2011 0000   UROBILINOGEN 0.2 10/13/2010 0012   NITRITE NEGATIVE 02/09/2021 1541   LEUKOCYTESUR NEGATIVE 02/09/2021 1541    Radiological Exams on Admission: CT Head Wo Contrast  Result Date: 02/09/2021 CLINICAL DATA:  Dizziness for a few days. Status post syncope and possible seizure today. EXAM: CT HEAD WITHOUT CONTRAST TECHNIQUE: Contiguous axial images were obtained from the base of the skull through the vertex without intravenous contrast. COMPARISON:  Head CT 09/15/2020. FINDINGS: Brain: No evidence of acute  infarction, hemorrhage, hydrocephalus, extra-axial collection or mass lesion/mass effect. Cortical atrophy is unchanged. Vascular: No hyperdense vessel or unexpected calcification. Skull: Intact.  No focal lesion. Sinuses/Orbits: Mild mucosal thickening inferior aspect of the left maxillary sinus is noted. Otherwise negative. Other:  Scalp contusion on the right is seen. IMPRESSION: No acute intracranial normality. Cortical atrophy. Scalp contusion on the right. Electronically Signed   By: Drusilla Kanner M.D.   On: 02/09/2021 14:17   MR BRAIN WO CONTRAST  Result Date: 02/09/2021 CLINICAL DATA:  Fall with seizure activity. EXAM: MRI HEAD WITHOUT CONTRAST TECHNIQUE: Multiplanar, multiecho pulse sequences of the brain and surrounding structures were obtained without intravenous contrast. COMPARISON:  Head CT 02/09/2021 FINDINGS: Brain: There is no evidence of an acute infarct, intracranial hemorrhage, mass, midline shift, or extra-axial fluid collection. Scattered small T2 hyperintensities in the cerebral white matter bilaterally are nonspecific but compatible with mild chronic small vessel ischemic disease. There is mild cerebral atrophy. Vascular: Major intracranial vascular flow voids are preserved. Skull and upper cervical spine: Unremarkable bone marrow signal. Small right parietal scalp hematoma. Sinuses/Orbits: Unremarkable orbits. Mild mucosal thickening in the left maxillary sinus. Clear mastoid air cells. Other: None. IMPRESSION: 1. No acute intracranial abnormality. 2. Mild chronic small vessel ischemic disease and cerebral atrophy. 3. Small right parietal scalp hematoma. Electronically Signed   By: Sebastian Ache M.D.   On: 02/09/2021 16:22    EKG: Independently reviewed. NSR LAD, ? Old anteroseptal injury  Assessment/Plan Active Problems:   Seizure (HCC)   Anxiety   HTN (hypertension)   Elevated LFTs   Substance abuse (HCC)    1. Neuro - patient with new onset seizure - one witnessed in  the ED. Neuro exam non-focal. 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 Patient received 1g Keppra IV in ED. Dr. Napoleon Form, neurology, has seen patient Plan Med-surg observation  EEG  Keppra 500 mg BID  2. HTN- stable. Continue home meds  3. Psych - patient stable. Continue home meds  4. Elevated LFTs - noted as outpatient. She has never been screen for chronic hepatitis. Did not get Abd U/S Plan Chronic hepatitis panel  Abd U/S complete  5. Substance abuse - per PA and nurse patient drinks at least 2 0z spirits daily, perhaps more. Current EtOH level 0 with reported last drink 24 hr ago. Plan Alcohol withdrawal protocol initiated.   DVT prophylaxis: lovenox  Code Status: full code  Family Communication: SO in the room during exam. Understands Dx and Tx plan  Disposition Plan: home 24-48 hrs  Consults called: Neurology - Dr. Napoleon Form (with names) Admission status: obs    Illene Regulus MD [facility] Pager [phone]  If 7PM-7AM, please contact night-coverage [email or web address] Password [facility]  02/09/2021, 6:36 PM

## 2021-02-09 NOTE — ED Provider Notes (Signed)
I assumed care of patient at shift change from previous team, please see their note for full H&P. Briefly patient is here for evaluation after a syncopal event where she reportedly had some twitching. She does not have a history of seizures. She does drink 2 "low ball" drinks of bourbon every day and denies stopping drinking or reducing the amount of she is drinking. Her last drink was last night. She denies any history of alcohol withdrawal. She denies drug use.  Plan is to follow-up on MRI and reevaluate for disposition.     1618: Per Dr. Jacqulyn Bath patient had a seizure, lasted about 30 seconds.  Patient getting ativan now.   I spoke with the patient. She is slightly tremulous which she and her boyfriend report started yesterday however has been worsening. She is oriented to person, not to place or time. This is not her baseline according to her boyfriend.  Neurology consult is ordered as this appears to have been her second seizure.  I spoke with Dr. Napoleon Form of neurology. Neurology will see patient in consult. Given that patient is still pretty tremulous and not back to baseline mentally we discussed admission. He recommended 1 g IV Keppra load followed by 500 p.o. twice daily.  I spoke with hospitalist, discussed neurology recommendations, along with patient having two seizures in 1 day with a first-time seizure and still being very tremulous and disoriented/not at baseline.  Patient will require admission.  I did discuss with both patient and her boyfriend who is at bedside that patient is not to drive, operate heavy machinery, or perform other potentially dangerous tasks for at least 6 months and until cleared to resume driving and these tasks by neurology.  Note: Portions of this report may have been transcribed using voice recognition software. Every effort was made to ensure accuracy; however, inadvertent computerized transcription errors may be present    CT Head Wo Contrast  Result  Date: 02/09/2021 CLINICAL DATA:  Dizziness for a few days. Status post syncope and possible seizure today. EXAM: CT HEAD WITHOUT CONTRAST TECHNIQUE: Contiguous axial images were obtained from the base of the skull through the vertex without intravenous contrast. COMPARISON:  Head CT 09/15/2020. FINDINGS: Brain: No evidence of acute infarction, hemorrhage, hydrocephalus, extra-axial collection or mass lesion/mass effect. Cortical atrophy is unchanged. Vascular: No hyperdense vessel or unexpected calcification. Skull: Intact.  No focal lesion. Sinuses/Orbits: Mild mucosal thickening inferior aspect of the left maxillary sinus is noted. Otherwise negative. Other: Scalp contusion on the right is seen. IMPRESSION: No acute intracranial normality. Cortical atrophy. Scalp contusion on the right. Electronically Signed   By: Drusilla Kanner M.D.   On: 02/09/2021 14:17   MR BRAIN WO CONTRAST  Result Date: 02/09/2021 CLINICAL DATA:  Fall with seizure activity. EXAM: MRI HEAD WITHOUT CONTRAST TECHNIQUE: Multiplanar, multiecho pulse sequences of the brain and surrounding structures were obtained without intravenous contrast. COMPARISON:  Head CT 02/09/2021 FINDINGS: Brain: There is no evidence of an acute infarct, intracranial hemorrhage, mass, midline shift, or extra-axial fluid collection. Scattered small T2 hyperintensities in the cerebral white matter bilaterally are nonspecific but compatible with mild chronic small vessel ischemic disease. There is mild cerebral atrophy. Vascular: Major intracranial vascular flow voids are preserved. Skull and upper cervical spine: Unremarkable bone marrow signal. Small right parietal scalp hematoma. Sinuses/Orbits: Unremarkable orbits. Mild mucosal thickening in the left maxillary sinus. Clear mastoid air cells. Other: None. IMPRESSION: 1. No acute intracranial abnormality. 2. Mild chronic small vessel ischemic disease and cerebral  atrophy. 3. Small right parietal scalp hematoma.  Electronically Signed   By: Sebastian Ache M.D.   On: 02/09/2021 16:22    Labs Reviewed  COMPREHENSIVE METABOLIC PANEL - Abnormal; Notable for the following components:      Result Value   AST 111 (*)    ALT 52 (*)    All other components within normal limits  CBC WITH DIFFERENTIAL/PLATELET - Abnormal; Notable for the following components:   RBC 3.29 (*)    Hemoglobin 11.1 (*)    HCT 34.5 (*)    MCV 104.9 (*)    Lymphs Abs 0.6 (*)    All other components within normal limits  URINALYSIS, ROUTINE W REFLEX MICROSCOPIC - Abnormal; Notable for the following components:   Ketones, ur 20 (*)    All other components within normal limits  SARS CORONAVIRUS 2 (TAT 6-24 HRS)  CK  RAPID URINE DRUG SCREEN, HOSP PERFORMED  ETHANOL  CBG MONITORING, ED  TROPONIN I (HIGH SENSITIVITY)  TROPONIN I (HIGH SENSITIVITY)           Cristina Gong, PA-C 02/09/21 2351    Rolan Bucco, MD 02/12/21 1419

## 2021-02-10 ENCOUNTER — Observation Stay (HOSPITAL_COMMUNITY): Payer: No Typology Code available for payment source

## 2021-02-10 ENCOUNTER — Encounter (HOSPITAL_COMMUNITY): Payer: Self-pay | Admitting: Internal Medicine

## 2021-02-10 ENCOUNTER — Other Ambulatory Visit: Payer: Self-pay

## 2021-02-10 DIAGNOSIS — Z8249 Family history of ischemic heart disease and other diseases of the circulatory system: Secondary | ICD-10-CM | POA: Diagnosis not present

## 2021-02-10 DIAGNOSIS — Y900 Blood alcohol level of less than 20 mg/100 ml: Secondary | ICD-10-CM | POA: Diagnosis present

## 2021-02-10 DIAGNOSIS — G4089 Other seizures: Secondary | ICD-10-CM | POA: Diagnosis present

## 2021-02-10 DIAGNOSIS — Y9389 Activity, other specified: Secondary | ICD-10-CM | POA: Diagnosis not present

## 2021-02-10 DIAGNOSIS — F17210 Nicotine dependence, cigarettes, uncomplicated: Secondary | ICD-10-CM | POA: Diagnosis present

## 2021-02-10 DIAGNOSIS — F10239 Alcohol dependence with withdrawal, unspecified: Secondary | ICD-10-CM | POA: Diagnosis present

## 2021-02-10 DIAGNOSIS — F411 Generalized anxiety disorder: Secondary | ICD-10-CM | POA: Diagnosis present

## 2021-02-10 DIAGNOSIS — G9341 Metabolic encephalopathy: Secondary | ICD-10-CM | POA: Diagnosis present

## 2021-02-10 DIAGNOSIS — D539 Nutritional anemia, unspecified: Secondary | ICD-10-CM | POA: Diagnosis present

## 2021-02-10 DIAGNOSIS — Z20822 Contact with and (suspected) exposure to covid-19: Secondary | ICD-10-CM | POA: Diagnosis present

## 2021-02-10 DIAGNOSIS — H5509 Other forms of nystagmus: Secondary | ICD-10-CM | POA: Diagnosis present

## 2021-02-10 DIAGNOSIS — Z888 Allergy status to other drugs, medicaments and biological substances status: Secondary | ICD-10-CM | POA: Diagnosis not present

## 2021-02-10 DIAGNOSIS — S0990XD Unspecified injury of head, subsequent encounter: Secondary | ICD-10-CM

## 2021-02-10 DIAGNOSIS — Z8041 Family history of malignant neoplasm of ovary: Secondary | ICD-10-CM | POA: Diagnosis not present

## 2021-02-10 DIAGNOSIS — D509 Iron deficiency anemia, unspecified: Secondary | ICD-10-CM | POA: Diagnosis present

## 2021-02-10 DIAGNOSIS — R7989 Other specified abnormal findings of blood chemistry: Secondary | ICD-10-CM | POA: Diagnosis not present

## 2021-02-10 DIAGNOSIS — I69398 Other sequelae of cerebral infarction: Secondary | ICD-10-CM

## 2021-02-10 DIAGNOSIS — R945 Abnormal results of liver function studies: Secondary | ICD-10-CM | POA: Diagnosis present

## 2021-02-10 DIAGNOSIS — S0990XA Unspecified injury of head, initial encounter: Secondary | ICD-10-CM | POA: Diagnosis present

## 2021-02-10 DIAGNOSIS — Y990 Civilian activity done for income or pay: Secondary | ICD-10-CM | POA: Diagnosis not present

## 2021-02-10 DIAGNOSIS — Y92512 Supermarket, store or market as the place of occurrence of the external cause: Secondary | ICD-10-CM | POA: Diagnosis not present

## 2021-02-10 DIAGNOSIS — R569 Unspecified convulsions: Secondary | ICD-10-CM | POA: Diagnosis present

## 2021-02-10 DIAGNOSIS — G4700 Insomnia, unspecified: Secondary | ICD-10-CM | POA: Diagnosis present

## 2021-02-10 DIAGNOSIS — W1839XA Other fall on same level, initial encounter: Secondary | ICD-10-CM | POA: Diagnosis present

## 2021-02-10 DIAGNOSIS — F329 Major depressive disorder, single episode, unspecified: Secondary | ICD-10-CM

## 2021-02-10 DIAGNOSIS — E876 Hypokalemia: Secondary | ICD-10-CM | POA: Diagnosis present

## 2021-02-10 DIAGNOSIS — J45909 Unspecified asthma, uncomplicated: Secondary | ICD-10-CM | POA: Diagnosis present

## 2021-02-10 DIAGNOSIS — Z885 Allergy status to narcotic agent status: Secondary | ICD-10-CM | POA: Diagnosis not present

## 2021-02-10 DIAGNOSIS — F419 Anxiety disorder, unspecified: Secondary | ICD-10-CM | POA: Diagnosis not present

## 2021-02-10 DIAGNOSIS — E871 Hypo-osmolality and hyponatremia: Secondary | ICD-10-CM | POA: Diagnosis not present

## 2021-02-10 DIAGNOSIS — K838 Other specified diseases of biliary tract: Secondary | ICD-10-CM | POA: Diagnosis present

## 2021-02-10 DIAGNOSIS — F1910 Other psychoactive substance abuse, uncomplicated: Secondary | ICD-10-CM

## 2021-02-10 DIAGNOSIS — W19XXXA Unspecified fall, initial encounter: Secondary | ICD-10-CM

## 2021-02-10 DIAGNOSIS — Z886 Allergy status to analgesic agent status: Secondary | ICD-10-CM | POA: Diagnosis not present

## 2021-02-10 DIAGNOSIS — Z833 Family history of diabetes mellitus: Secondary | ICD-10-CM | POA: Diagnosis not present

## 2021-02-10 DIAGNOSIS — I10 Essential (primary) hypertension: Secondary | ICD-10-CM | POA: Diagnosis present

## 2021-02-10 LAB — PHOSPHORUS: Phosphorus: 3.8 mg/dL (ref 2.5–4.6)

## 2021-02-10 LAB — MAGNESIUM: Magnesium: 1.5 mg/dL — ABNORMAL LOW (ref 1.7–2.4)

## 2021-02-10 MED ORDER — MAGNESIUM SULFATE 2 GM/50ML IV SOLN
2.0000 g | Freq: Once | INTRAVENOUS | Status: AC
  Administered 2021-02-10: 2 g via INTRAVENOUS
  Filled 2021-02-10: qty 50

## 2021-02-10 NOTE — Procedures (Signed)
Patient Name: Kelly Zuniga  MRN: [id number]  Epilepsy Attending: Charlsie Quest  Referring Physician/Provider: Dr. Illene Regulus Date: 02/10/2021 Duration: 23.50 minutes  Patient history: 55 year old female with new onset seizure.  EEG to evaluate for seizures.  Level of alertness: Awake, asleep  AEDs during EEG study: Keppra, topiramate  Technical aspects: This EEG study was done with scalp electrodes positioned according to the 10-20 International system of electrode placement. Electrical activity was acquired at a sampling rate of 500Hz  and reviewed with a high frequency filter of 70Hz  and a low frequency filter of 1Hz . EEG data were recorded continuously and digitally stored.   Description: The posterior dominant rhythm consists of 9-10 Hz activity of moderate voltage (25-35 uV) seen predominantly in posterior head regions, symmetric and reactive to eye opening and eye closing. Sleep was characterized by vertex waves, sleep spindles (12 to 14 Hz), maximal frontocentral region. Physiologic photic driving was seen during photic stimulation.  Hyperventilation was not performed.     IMPRESSION: This study is within normal limits. No seizures or epileptiform discharges were seen throughout the recording.  Blair Mesina 

## 2021-02-10 NOTE — Progress Notes (Signed)
EEG Completed; Results Pending  

## 2021-02-10 NOTE — Progress Notes (Addendum)
Neurology Progress Note  Subjective: No overnight events.  Patient evaluated at bedside this morning, no family present in the room.  She denies any new complaints but states she is feeling tired today.  She states last drink he had was on Friday which was 2 seltzer, her daily routine.  Also, had 2 glasses of bourbon on Thursday night.  About her trembling, she explains she has had them multiple times in the past whenever she is sick or extremely tense and then it goes away on its own after couple of days.  Last time when she had it she tried Phenergan and that helped with her tremulousness.  No family history of tremors or seizures. Denies headache today.  Objective: Current vital signs: BP 138/86 (BP Location: Right Arm)   Pulse 82   Temp 98.5 F (36.9 C) (Oral)   Resp 17   SpO2 97%  Vital signs in last 24 hours: Temp:  [98.2 F (36.8 C)-98.6 F (37 C)] 98.5 F (36.9 C) (02/21 1012) Pulse Rate:  [82-97] 82 (02/21 1012) Resp:  [15-28] 17 (02/21 1012) BP: (112-162)/(55-128) 138/86 (02/21 1012) SpO2:  [96 %-99 %] 97 % (02/21 1012)  GENERAL: Awake, alert in NAD HEENT: Normocephalic and atraumatic LUNGS: Normal respiratory effort.  CV: Regular rate and rhythm. ABDOMEN: Soft, nontender Ext: warm  NEUROLOGICAL:  Mental Status: Patient is alert, awake, oriented to person, place, month, year and situation.  Able to give a coherent current history.  No signs of aphasia or neglect. Cranial Nerves:  II: Visual fields are full.  Pupils unequal in size (L>R), h/o Holmes-Adie pupil. III, IV, VI: EOMI. Eyelids elevate symmetrically.  V: Sensation is intact to light touch and symmetrical to face.  VII: Smile is symmetrical. Able to puff cheeks and raise eyebrows.  VIII: hearing intact to voice. IX, X: Palate elevates symmetrically. Phonation is normal.  LI:DCVUDTHY shrug 5/5. XII: tongue is midline without fasciculations. Motor: 5/5 strength to all muscle groups tested.  Tremulousness//fine,  fast frequency tremors noted bilaterally in upper extremities, none in lower extremities. Tone: is normal and bulk is normal Sensation: Intact to light touch bilaterally in both upper and lower extremities.  Cerebellar: FNF and FTN are intact bilaterally. Deep Tendon Reflexess: 3+ and symmetric in brachioradialis, biceps, triceps 2+ and symmetric in ankles. Plantars:  Toes are downgoing bilaterally. Gait: deferred  Medications  Current Facility-Administered Medications:  .  0.9 %  sodium chloride infusion, 75 mL/hr, Intravenous, Continuous, Norins, Rosalyn Gess, MD, Last Rate: 75 mL/hr at 02/09/21 2030, 75 mL/hr at 02/09/21 2030 .  albuterol (VENTOLIN HFA) 108 (90 Base) MCG/ACT inhaler 2 puff, 2 puff, Inhalation, Q4H PRN, Norins, Rosalyn Gess, MD .  clonazePAM (KLONOPIN) tablet 0.5 mg, 0.5 mg, Oral, TID PRN, Norins, Rosalyn Gess, MD .  enoxaparin (LOVENOX) injection 40 mg, 40 mg, Subcutaneous, Q24H, Norins, Rosalyn Gess, MD .  escitalopram (LEXAPRO) tablet 10 mg, 10 mg, Oral, q AM, Norins, Rosalyn Gess, MD, 10 mg at 02/10/21 3888 .  fluticasone (FLONASE) 50 MCG/ACT nasal spray 1 spray, 1 spray, Each Nare, Daily, Norins, Rosalyn Gess, MD .  folic acid (FOLVITE) tablet 1 mg, 1 mg, Oral, Daily, Norins, Rosalyn Gess, MD, 1 mg at 02/10/21 0919 .  ibuprofen (ADVIL) tablet 200 mg, 200 mg, Oral, Q6H PRN, Norins, Rosalyn Gess, MD, 200 mg at 02/09/21 1834 .  levETIRAcetam (KEPPRA) tablet 500 mg, 500 mg, Oral, BID, Norins, Rosalyn Gess, MD, 500 mg at 02/10/21 7579 .  loratadine (CLARITIN) tablet 10 mg,  10 mg, Oral, Daily, Norins, Rosalyn Gess, MD, 10 mg at 02/10/21 [phone] .  LORazepam (ATIVAN) tablet 1-4 mg, 1-4 mg, Oral, Q1H PRN **OR** LORazepam (ATIVAN) injection 1-4 mg, 1-4 mg, Intravenous, Q1H PRN, Norins, Rosalyn Gess, MD, 2 mg at 02/10/21 0042 .  meclizine (ANTIVERT) tablet 12.5 mg, 12.5 mg, Oral, BID, Norins, Rosalyn Gess, MD, 12.5 mg at 02/10/21 9983 .  multivitamin with minerals tablet 1 tablet, 1 tablet, Oral, Daily, Norins,  Rosalyn Gess, MD, 1 tablet at 02/10/21 [phone] .  ondansetron (ZOFRAN) injection 4 mg, 4 mg, Intravenous, Q6H PRN, Norins, Rosalyn Gess, MD, 4 mg at 02/09/21 2033 .  thiamine tablet 100 mg, 100 mg, Oral, Daily, 100 mg at 02/10/21 0919 **OR** thiamine (B-1) injection 100 mg, 100 mg, Intravenous, Daily, Norins, Rosalyn Gess, MD .  topiramate (TOPAMAX) tablet 25 mg, 25 mg, Oral, BID, Norins, Rosalyn Gess, MD, 25 mg at 02/10/21 0919  Pertinent Labs Low Mg on 2/20 1.5   Imaging CT Head wo contrast 02/09/2021  IMPRESSION: No acute intracranial normality. Cortical atrophy. Scalp contusion on the right.  MRI Brain wo contrast 02/09/2021  IMPRESSION: 1. No acute intracranial abnormality. 2. Mild chronic small vessel ischemic disease and cerebral atrophy. 3. Small right parietal scalp hematoma.  EEG 02/10/2021  IMPRESSION: This study is within normal limits. No seizures or epileptiform discharges were seen throughout the recording   Assessment: 55 year old female admitted with new onset seizures, most likely alcohol withdrawal seizures. -Blood alcohol level<10 on 2/20, last known drink on 2/19.  Presents with nausea/vomiting, ketonuria, tremors in upper extremities bilaterally, seizure-like activity and hyperreflexia. -History of vertigo and also history of migraine headaches, uses Topamax. -On examination today patient is alert awake oriented, still tremulous in upper extremities (on & off), hyperreflexic. Tremors seems most likely functional, does not need any more work up for it inpatient. -CT head does not show any acute intracranial abnormality.  MRI brain does not show any acute intracranial abnormality, mild chronic small vessel ischemic disease and cerebral atrophy along with small right parietal scalp hematoma. -EEG today, does not show any seizures or epileptiform discharges.  Impression: New onset seizure-likely EtOH withdrawal. Functional tremor Chronic migraines and vertigo-needs  outpatient follow-up  Recommendations: -Continue Keppra 500 mg twice daily for 3 to 6 months until seizure-free. -Continue Thiamine and Folic acid -Avoid Pherngen (Anti-emetic) or Anti-psychotic with SSRI to avoid Serotonin syndrome. -Continue CIWA -Slowly taper off Wellbutrin as it has tendency to lower the seizure threshold into inpatient or can be done as an outpatient as well. -Neurology will be available for any questions.   Arnoldo Lenis, MD PGY-1, Resident   Attending Neurohospitalist Addendum Patient seen and examined with APP/Resident. Agree with the history and physical as documented above. Agree with the plan as documented, which I helped formulate. I have independently reviewed the chart, obtained history, review of systems and examined the patient.I have personally reviewed pertinent head/neck/spine imaging (CT/MRI). Please feel free to call with any questions. -- Milon Dikes, MD Neurologist [facility] Pager: [phone]

## 2021-02-10 NOTE — Progress Notes (Addendum)
Progress Note    Kelly Zuniga  BSJ:[id number] DOB: 04-12-1966  DOA: 02/09/2021 PCP: [name], NP    Brief Narrative:   Chief complaint: Syncope  Medical records reviewed and are as summarized below:  Kelly Zuniga is an 55 y.o. female with a PMH of alcohol abuse, asthma, anxiety/depression, HTN who presented with a chief complaint of a 3-4 day h/o of dizziness/vertigo associated with a brief episode of LOC and falling and striking posterior occiput. Coworkers witnessed whole body shaking lasting several seconds. EMS activated and patient transported to [facility] for evaluation. In the ED, exam notable for horizontal nystagmus bilaterally. CT/MRI negative for acute findings. Upon return from MRI, patient had an unresponsive event with abnormal movements (lateral gaze but on tonic-clonic activity). Was noted to be post-ictal x 1 hour. Keppra initiated and neurology consulted.  Assessment/Plan:   Principal Problem:   Seizure (HCC) associated with vertigo, POA Neurology consulted. MRI/CT negative (personally reviewed). EEG pending, but exam and history pointing to ETOH withdrawal seizure. Vertebrobasilar insufficiency in the differential but less likely, and neurologist does not think a CTA of the head/neck indicated at this time. Very tremulous on exam and given risk of DTs, will change to full admission.  She is not medically stable to discharge today. Continue Meclizine, Keppra, IVF and neuro checks. Discontinue Wellbutrin as this medication is known to lower seizure threshold.   Active Problems:   Fall/head injury secondary to principle problem Supportive care. No skull fracture on imaging.    Hypomagnesemia, POA 2 grams IV replacement ordered.    Macrocytic anemia Likely from nutritional deficiency related to ETOH.  Supplementing thiamine/folic acid.    Major depressive disorder/generalized anxiety disorder, insomnia. Sees [facility] psychiatry. Continue Topamax,  Klonopin and Lexapro. Discontinue Wellbutrin given ? Seizures. Will route note to her treating psychiatry provider to let her know of seizures.    HTN (hypertension) BP 130s/70's-80's.    Substance abuse (HCC)/Alcohol abuse Continue Ativan per CIWA protocol, folic acid, thiamine. CIWA score 6 currently but has been as high as 31 over the past 24 hours.    Elevated LFTs May be related to ETOH. Monitor trend.    Asthma Continue PRN bronchodilators, lungs clear.    Holmes-Adie syndrome Tonic pupil.   Nutritional status        There is no height or weight on file to calculate BMI.   Family Communication/Anticipated D/C date and plan/Code Status   DVT prophylaxis: enoxaparin (LOVENOX) injection 40 mg Start: 02/09/21 2200   Current Level of Care:: Level of care: Med-Surg Code Status: Full Code.  Family Communication: No family at bedside. Disposition Plan: Status is: Observation  The patient will require care spanning > 2 midnights and should be moved to inpatient because: persistent AMS, tremulousness, at high risk for DTs, seizure.  Dispo: The patient is from: Home              Anticipated d/c is to: Home              Anticipated d/c date is: 2 days              Patient currently is not medically stable to d/c.   Difficult to place patient No   Medical Consultants:    Neurology   Anti-Infectives:    None  Subjective:   Lethargic, tremulous, minimizing ETOH use. Continues to be very dizzy. No chest pain. No current nausea or vomiting.  Objective:    Vitals:  02/10/21 0700 02/10/21 0800 02/10/21 0900 02/10/21 1012  BP: 131/80 133/80 135/77 138/86  Pulse: 96 91 97 82  Resp:   16 17  Temp:   98.5 F (36.9 C) 98.5 F (36.9 C)  TempSrc:   Oral Oral  SpO2: 96% 99% 98% 97%    Intake/Output Summary (Last 24 hours) at 02/10/2021 1114 Last data filed at 02/09/2021 1800 Gross per 24 hour  Intake 220 ml  Output -  Net 220 ml   There were no vitals filed  for this visit.  Exam: General: Lethargic, tremulous.  Cardiovascular: Heart sounds are regular, tachycardic. No gallops or rubs. No murmurs. No JVD. Lungs: Clear to auscultation bilaterally with good air movement. No rales, rhonchi or wheezes. Abdomen: Soft, nontender, nondistended with normal active bowel sounds. No masses. No hepatosplenomegaly. Neurological: Lethargic, awakens to voice. Moves all extremities 4 with equal strength. Cranial nerves II through XII grossly intact except L>R pupil. Hand tremors present. Skin: Warm and dry. Head bandaged posterior occiput. Extremities: No clubbing or cyanosis. No edema. Pedal pulses 2+. Psychiatric: Mood and affect are anxious. Insight and judgment are fair.         Data Reviewed:   I have personally reviewed following labs and imaging studies:  Labs: Labs show the following:   Basic Metabolic Panel: Recent Labs  Lab 02/09/21 1245 02/09/21 2016  NA 140  --   K 4.4  --   CL 101  --   CO2 25  --   GLUCOSE 97  --   BUN 16  --   CREATININE 0.74  --   CALCIUM 9.6  --   MG  --  1.5*  PHOS  --  3.8   GFR CrCl cannot be calculated (Unknown ideal weight.). Liver Function Tests: Recent Labs  Lab 02/09/21 1245  AST 111*  ALT 52*  ALKPHOS 68  BILITOT 0.9  PROT 7.0  ALBUMIN 4.6   Recent Labs  Lab 02/09/21 1633  LIPASE 59*   CBC: Recent Labs  Lab 02/09/21 1245  WBC 5.1  NEUTROABS 3.5  HGB 11.1*  HCT 34.5*  MCV 104.9*  PLT 191   Cardiac Enzymes: Recent Labs  Lab 02/09/21 1245  CKTOTAL 125   CBG: Recent Labs  Lab 02/09/21 1245 02/09/21 1718  GLUCAP 88 102*   Microbiology Recent Results (from the past 240 hour(s))  SARS CORONAVIRUS 2 (TAT 6-24 HRS) Nasopharyngeal Nasopharyngeal Swab     Status: None   Collection Time: 02/09/21  6:42 PM   Specimen: Nasopharyngeal Swab  Result Value Ref Range Status   SARS Coronavirus 2 NEGATIVE NEGATIVE Final    Comment: (NOTE) SARS-CoV-2 target nucleic acids are  NOT DETECTED.  The SARS-CoV-2 RNA is generally detectable in upper and lower respiratory specimens during the acute phase of infection. Negative results do not preclude SARS-CoV-2 infection, do not rule out co-infections with other pathogens, and should not be used as the sole basis for treatment or other patient management decisions. Negative results must be combined with clinical observations, patient history, and epidemiological information. The expected result is Negative.  Fact Sheet for Patients: HairSlick.no  Fact Sheet for Healthcare Providers: quierodirigir.com  This test is not yet approved or cleared by the [place] FDA and  has been authorized for detection and/or diagnosis of SARS-CoV-2 by FDA under an Emergency Use Authorization (EUA). This EUA will remain  in effect (meaning this test can be used) for the duration of the COVID-19 declaration under Se ction  564(b)(1) of the Act, 21 U.S.C. section 360bbb-3(b)(1), unless the authorization is terminated or revoked sooner.  Performed at [facility], [address] [place], [place] [address]     Procedures and diagnostic studies:  CT Head Wo Contrast  Result Date: 02/09/2021 CLINICAL DATA:  Dizziness for a few days. Status post syncope and possible seizure today. EXAM: CT HEAD WITHOUT CONTRAST TECHNIQUE: Contiguous axial images were obtained from the base of the skull through the vertex without intravenous contrast. COMPARISON:  Head CT 09/15/2020. FINDINGS: Brain: No evidence of acute infarction, hemorrhage, hydrocephalus, extra-axial collection or mass lesion/mass effect. Cortical atrophy is unchanged. Vascular: No hyperdense vessel or unexpected calcification. Skull: Intact.  No focal lesion. Sinuses/Orbits: Mild mucosal thickening inferior aspect of the left maxillary sinus is noted. Otherwise negative. Other: Scalp contusion on the right is seen.  IMPRESSION: No acute intracranial normality. Cortical atrophy. Scalp contusion on the right. Electronically Signed   By: Drusilla Kannerhomas  Dalessio M.D.   On: 02/09/2021 14:17   MR BRAIN WO CONTRAST  Result Date: 02/09/2021 CLINICAL DATA:  Fall with seizure activity. EXAM: MRI HEAD WITHOUT CONTRAST TECHNIQUE: Multiplanar, multiecho pulse sequences of the brain and surrounding structures were obtained without intravenous contrast. COMPARISON:  Head CT 02/09/2021 FINDINGS: Brain: There is no evidence of an acute infarct, intracranial hemorrhage, mass, midline shift, or extra-axial fluid collection. Scattered small T2 hyperintensities in the cerebral white matter bilaterally are nonspecific but compatible with mild chronic small vessel ischemic disease. There is mild cerebral atrophy. Vascular: Major intracranial vascular flow voids are preserved. Skull and upper cervical spine: Unremarkable bone marrow signal. Small right parietal scalp hematoma. Sinuses/Orbits: Unremarkable orbits. Mild mucosal thickening in the left maxillary sinus. Clear mastoid air cells. Other: None. IMPRESSION: 1. No acute intracranial abnormality. 2. Mild chronic small vessel ischemic disease and cerebral atrophy. 3. Small right parietal scalp hematoma. Electronically Signed   By: Sebastian AcheAllen  Grady M.D.   On: 02/09/2021 16:22   US Abdomen Complete  Result Date: 02/10/2021 CLINICAL DATA:  Elevated LFTs EXAM: ABDOMEN ULTRASOUND COMPLETE COMPARISON:  None. FINDINGS: Gallbladder: No gallstones or wall thickening visualized. Altered mental status limits evaluation of a sonographic Murphy sign. Common bile duct: Diameter: 10.1 mm, dilated Liver: Diffusely increased hepatic echogenicity with loss of definition of the portal triads and diminished posterior through transmission compatible with hepatic steatosis. No focal liver lesion is seen. Portal vein is patent on color Doppler imaging with normal direction of blood flow towards the liver. IVC: No  abnormality visualized. Pancreas: Suboptimally visualized.  Visualized portion unremarkable. Spleen: Size and appearance within normal limits. Right Kidney: Length: 10.1 cm. Echogenicity within normal limits. No mass or hydronephrosis visualized. Left Kidney: Length: 9.9 cm. Echogenicity within normal limits. No mass or hydronephrosis visualized. Abdominal aorta: Incomplete visualization. Atherosclerotic calcifications are present. Maximal AP diameter of the visualized aorta measures 2.3 cm. Other findings: Technically challenging exam due to altered mental status. IMPRESSION: 1. Technically challenging exam due to patient's altered mental status. 2. Dilated common bile duct measuring up to 10.1 mm. Recommend correlation with serum bilirubin levels and if there is derangement/concern for choledocholithiasis, MRCP could be obtained. This recommendation follows ACR consensus guidelines: White Paper of the ACR Incidental Findings Committee II on Gallbladder and Biliary Findings. J Am Coll Radiol 2013:;10:953-956. 3. Diffusely increased hepatic echogenicity, most commonly seen with hepatic steatosis. No focal liver lesion is seen. 4.  Aortic Atherosclerosis (ICD10-I70.0). Electronically Signed   By: Kreg ShropshirePrice  DeHay M.D.   On: 02/10/2021 05:54  Medications:   . enoxaparin (LOVENOX) injection  40 mg Subcutaneous Q24H  . escitalopram  10 mg Oral q AM  . fluticasone  1 spray Each Nare Daily  . folic acid  1 mg Oral Daily  . levETIRAcetam  500 mg Oral BID  . loratadine  10 mg Oral Daily  . meclizine  12.5 mg Oral BID  . multivitamin with minerals  1 tablet Oral Daily  . thiamine  100 mg Oral Daily   Or  . thiamine  100 mg Intravenous Daily  . topiramate  25 mg Oral BID   Continuous Infusions: . sodium chloride 75 mL/hr (02/09/21 2030)     LOS: 0 days   Hillery Aldo, MD  [facility]   [facility] How to contact the [facility] Attending or Consulting provider 7A - 7P or covering provider  during after hours 7P -7A, for this patient?  1. Check the care team in [facility] and look for a) attending/consulting [facility] provider listed and b) the [facility] team listed 2. Log into [email or web address] and use [place]'s universal password to access. If you do not have the password, please contact the hospital operator. 3. Locate the [facility] provider you are looking for under [facility] and page to a number that you can be directly reached. 4. If you still have difficulty reaching the provider, please page the [facility] (Director on Call) for the Hospitalists listed on amion for assistance.  02/10/2021, 11:14 AM

## 2021-02-11 ENCOUNTER — Ambulatory Visit: Payer: No Typology Code available for payment source

## 2021-02-11 LAB — COMPREHENSIVE METABOLIC PANEL
ALT: 31 U/L (ref 0–44)
AST: 47 U/L — ABNORMAL HIGH (ref 15–41)
Albumin: 3.5 g/dL (ref 3.5–5.0)
Alkaline Phosphatase: 64 U/L (ref 38–126)
Anion gap: 12 (ref 5–15)
BUN: 8 mg/dL (ref 6–20)
CO2: 19 mmol/L — ABNORMAL LOW (ref 22–32)
Calcium: 8.6 mg/dL — ABNORMAL LOW (ref 8.9–10.3)
Chloride: 99 mmol/L (ref 98–111)
Creatinine, Ser: 0.71 mg/dL (ref 0.44–1.00)
GFR, Estimated: >60 mL/min (ref >60)
Glucose, Bld: 78 mg/dL (ref 70–99)
Potassium: 3.3 mmol/L — ABNORMAL LOW (ref 3.5–5.1)
Sodium: 130 mmol/L — ABNORMAL LOW (ref 135–145)
Total Bilirubin: 1.1 mg/dL (ref 0.3–1.2)
Total Protein: 5.9 g/dL — ABNORMAL LOW (ref 6.5–8.1)

## 2021-02-11 LAB — MAGNESIUM: Magnesium: 1.7 mg/dL (ref 1.7–2.4)

## 2021-02-11 MED ORDER — POTASSIUM CHLORIDE CRYS ER 20 MEQ PO TBCR
40.0000 meq | EXTENDED_RELEASE_TABLET | Freq: Once | ORAL | Status: AC
  Administered 2021-02-11: 40 meq via ORAL
  Filled 2021-02-11: qty 2

## 2021-02-11 MED ORDER — MAGNESIUM SULFATE 2 GM/50ML IV SOLN
2.0000 g | Freq: Once | INTRAVENOUS | Status: AC
  Administered 2021-02-11: 2 g via INTRAVENOUS
  Filled 2021-02-11: qty 50

## 2021-02-11 NOTE — Progress Notes (Signed)
PROGRESS NOTE  Kelly Zuniga JXB:[id number] DOB: 21-Apr-1966 DOA: 02/09/2021 PCP: [name], NP   LOS: 1 day   Brief Narrative / Interim history: 55 year old female with history of alcohol abuse, asthma, anxiety/depression, hypertension who comes in with a weeklong dizziness/vertigo associated with a episode of loss of consciousness while at work.  She tells me she works for [employer], felt very dizzy and next thing she notices is that she was surrounded by EMS.  She was brought to the hospital, she underwent a CT and MRI of the brain which were negative for acute findings.  Upon returning from MRI she had her unresponsive event with abnormal movements, lateral gaze but none tonic-clonic movements.  She was postictal for about an hour.  Keppra was started and neurology was consulted  Subjective / 24h Interval events: Remains somewhat tremulous this morning, asking to go home.  She seems to be mildly confused, very tangential in her answers.  When asked what city is this she tells me [facility] last year but [place] now.  When asked to clarify she tells me that she was referring to [place]  Assessment & Plan: Principal Problem Seizure, associated vertigo, POA-neurology consulted, appreciate input.  MRI and CT were negative for acute findings.  EEG was done yesterday showed no seizures or epileptiform discharges.  Patient restarted on Keppra, continue.  Continue meclizine  Active Problems Alcohol abuse, significant withdrawal-remains at risk for DTs, CIWA last night was as high as 18, still tremulous this morning.  Monitor  Acute metabolic encephalopathy, confusion-patient tangential in answering questions, intermittent confusion  Major depressive disorder/generalized anxiety disorder/insomnia-follows with psychiatry as an outpatient.  Continue Topamax, Klonopin, Lexapro.  Discontinue Wellbutrin given seizures  Elevated LFTs-improving, right upper quadrant ultrasound did showed CBD  dilatation but she is completely asymptomatic, no epigastric or right upper quadrant pain, no nausea no vomiting and tolerating diet well.  Bilirubin is normal.  Hypokalemia, hypomagnesemia -continue to replete today, continue to monitor  Hyponatremia-stop further IV fluids  Microcytic anemia-likely due to EtOH use, B12 was on the lower side of normal in 2014.  Recheck anemia panel.  Continue thiamine  Deconditioning-patient seen by physical therapy today, recommends SNF but she will only wants to go home. Will need home health maximized upon discharge  Scheduled Meds: . enoxaparin (LOVENOX) injection  40 mg Subcutaneous Q24H  . escitalopram  10 mg Oral q AM  . fluticasone  1 spray Each Nare Daily  . folic acid  1 mg Oral Daily  . levETIRAcetam  500 mg Oral BID  . loratadine  10 mg Oral Daily  . meclizine  12.5 mg Oral BID  . multivitamin with minerals  1 tablet Oral Daily  . thiamine  100 mg Oral Daily  . topiramate  25 mg Oral BID   Continuous Infusions: . magnesium sulfate bolus IVPB 2 g (02/11/21 0853)   PRN Meds:.albuterol, clonazePAM, ibuprofen, LORazepam **OR** LORazepam, ondansetron (ZOFRAN) IV  Diet Orders (From admission, onward)    Start     Ordered   02/10/21 1250  Diet regular Room service appropriate? Yes; Fluid consistency: Thin  Diet effective now       Question Answer Comment  Room service appropriate? Yes   Fluid consistency: Thin      02/10/21 1249          DVT prophylaxis: enoxaparin (LOVENOX) injection 40 mg Start: 02/09/21 2200     Code Status: Full Code  Family Communication: No family at bedside  Status  is: Inpatient  Remains inpatient appropriate because:Inpatient level of care appropriate due to severity of illness   Dispo: The patient is from: Home              Anticipated d/c is to: Home              Anticipated d/c date is: 2 days              Patient currently is not medically stable to d/c.   Difficult to place patient No  Level  of care: Telemetry Medical  Consultants:  Neurology   Procedures:  EEG  Microbiology  None   Antimicrobials: None     Objective: Vitals:   02/10/21 1900 02/11/21 0040 02/11/21 0426 02/11/21 0726  BP: (!) 147/83 (!) 148/88 (!) 149/87 (!) 141/88  Pulse: 78 80 84 96  Resp: 16 16 15 18   Temp: 98.2 F (36.8 C) 98.4 F (36.9 C) 98.4 F (36.9 C) 98.8 F (37.1 C)  TempSrc: Oral Oral Oral Oral  SpO2: 100% 96% 95% 96%  Weight:      Height:        Intake/Output Summary (Last 24 hours) at 02/11/2021 0948 Last data filed at 02/11/2021 02/13/2021 Gross per 24 hour  Intake 1770 ml  Output --  Net 1770 ml   Filed Weights   02/10/21 1535  Weight: 46.9 kg    Examination:  Constitutional: No apparent distress, visibly tremulous, cachectic appearing female Eyes: no scleral icterus ENMT: Mucous membranes are moist.  Neck: normal, supple Respiratory: clear to auscultation bilaterally, no wheezing, no crackles.  Cardiovascular: Regular rate and rhythm, no murmurs / rubs / gallops. No LE edema.  Abdomen: non distended, no tenderness. Bowel sounds positive.  Musculoskeletal: no clubbing / cyanosis.  Skin: no rashes Neurologic: Very tremulous at rest, no focal deficits  Data Reviewed: I have independently reviewed following labs and imaging studies   CBC: Recent Labs  Lab 02/09/21 1245  WBC 5.1  NEUTROABS 3.5  HGB 11.1*  HCT 34.5*  MCV 104.9*  PLT 191   Basic Metabolic Panel: Recent Labs  Lab 02/09/21 1245 02/09/21 2016 02/11/21 0443  NA 140  --  130*  K 4.4  --  3.3*  CL 101  --  99  CO2 25  --  19*  GLUCOSE 97  --  78  BUN 16  --  8  CREATININE 0.74  --  0.71  CALCIUM 9.6  --  8.6*  MG  --  1.5* 1.7  PHOS  --  3.8  --    Liver Function Tests: Recent Labs  Lab 02/09/21 1245 02/11/21 0443  AST 111* 47*  ALT 52* 31  ALKPHOS 68 64  BILITOT 0.9 1.1  PROT 7.0 5.9*  ALBUMIN 4.6 3.5   Coagulation Profile: No results for input(s): INR, PROTIME in the last  168 hours. HbA1C: No results for input(s): HGBA1C in the last 72 hours. CBG: Recent Labs  Lab 02/09/21 1245 02/09/21 1718  GLUCAP 88 102*    Recent Results (from the past 240 hour(s))  SARS CORONAVIRUS 2 (TAT 6-24 HRS) Nasopharyngeal Nasopharyngeal Swab     Status: None   Collection Time: 02/09/21  6:42 PM   Specimen: Nasopharyngeal Swab  Result Value Ref Range Status   SARS Coronavirus 2 NEGATIVE NEGATIVE Final    Comment: (NOTE) SARS-CoV-2 target nucleic acids are NOT DETECTED.  The SARS-CoV-2 RNA is generally detectable in upper and lower respiratory specimens during the acute phase  of infection. Negative results do not preclude SARS-CoV-2 infection, do not rule out co-infections with other pathogens, and should not be used as the sole basis for treatment or other patient management decisions. Negative results must be combined with clinical observations, patient history, and epidemiological information. The expected result is Negative.  Fact Sheet for Patients: HairSlick.no  Fact Sheet for Healthcare Providers: quierodirigir.com  This test is not yet approved or cleared by the [place] FDA and  has been authorized for detection and/or diagnosis of SARS-CoV-2 by FDA under an Emergency Use Authorization (EUA). This EUA will remain  in effect (meaning this test can be used) for the duration of the COVID-19 declaration under Se ction 564(b)(1) of the Act, 21 U.S.C. section 360bbb-3(b)(1), unless the authorization is terminated or revoked sooner.  Performed at [facility], [address], [place], [place] [address]      Radiology Studies: EEG now  Result Date: 02/10/2021 Charlsie Quest, MD     02/10/2021 12:34 PM Patient Name: Kelly Zuniga MRN: [id number] Epilepsy Attending: Charlsie Quest Referring Physician/Provider: Dr. Illene Regulus Date: 02/10/2021 Duration: 23.50 minutes Patient history:  55 year old female with new onset seizure.  EEG to evaluate for seizures. Level of alertness: Awake, asleep AEDs during EEG study: Keppra, topiramate Technical aspects: This EEG study was done with scalp electrodes positioned according to the 10-20 International system of electrode placement. Electrical activity was acquired at a sampling rate of 500Hz  and reviewed with a high frequency filter of 70Hz  and a low frequency filter of 1Hz . EEG data were recorded continuously and digitally stored. Description: The posterior dominant rhythm consists of 9-10 Hz activity of moderate voltage (25-35 uV) seen predominantly in posterior head regions, symmetric and reactive to eye opening and eye closing. Sleep was characterized by vertex waves, sleep spindles (12 to 14 Hz), maximal frontocentral region. Physiologic photic driving was seen during photic stimulation.  Hyperventilation was not performed.   IMPRESSION: This study is within normal limits. No seizures or epileptiform discharges were seen throughout the recording. Priyanka , MD, PhD [facility]  Between 7 am - 7 pm I am available, please contact me via Amion or Securechat  Between 7 pm - 7 am I am not available, please contact night coverage MD/APP via Amion

## 2021-02-11 NOTE — Evaluation (Signed)
Physical Therapy Evaluation Patient Details Name: Kelly Zuniga MRN: [id number] DOB: Aug 11, 1966 Today's Date: 02/11/2021   History of Present Illness  55yo female c/o dizziness and hand cramping and weakness, had recent fall in which she struck her posterior occiput. Brought to the ED from work by EMS and admitted with new onset seizures. PMH depression, HTN, substance abuse  Clinical Impression   Patient received in bed, very pleasant but very confused and shocked to learn she is not at [facility] in [facility], seems convinced that the events that brought her to the hospital occurred months ago. Somewhat impulsive with mobility and very unaware of overall deficits and safety. Needed up to [facility] for balance and safety with gait training in the hallway with no device. Left in bed with all needs met, bed alarm active and MD aware of patient status. Will potentially need SNF, however if she refuses would need 24/7A from family and max HHPT services.     Follow Up Recommendations SNF;Other (comment);Supervision/Assistance - 24 hour (may be able to progress to HHPT but would need 24/7A from family at this point for safety)    Equipment Recommendations  Rolling walker with 5" wheels;3in1 (PT)    Recommendations for Other Services       Precautions / Restrictions Precautions Precautions: Fall;Other (comment) Precaution Comments: moderately unsteady, seizure precautions Restrictions Weight Bearing Restrictions: No      Mobility  Bed Mobility Overal bed mobility: Needs Assistance Bed Mobility: Supine to Sit;Sit to Supine     Supine to sit: Supervision;HOB elevated Sit to supine: Supervision;HOB elevated   General bed mobility comments: increased time, no physical assist given    Transfers Overall transfer level: Needs assistance Equipment used: None Transfers: Sit to/from Stand Sit to Stand: Min assist         General transfer comment: MinA to boost to full upright standing and gain  balance, crouched posture and impulsively bend knees/does minisquats without warning  Ambulation/Gait Ambulation/Gait assistance: Mod assist Gait Distance (Feet): 150 Feet Assistive device: None Gait Pattern/deviations: Step-through pattern;Scissoring;Trendelenburg;Drifts right/left;Trunk flexed;Narrow base of support Gait velocity: decreased   General Gait Details: moderately unsteady with gait- drifts left and right and reaching out for IV pole intermittently. On and off scissoring pattern and LOB noted with poor awareness of overall deficits.  Stairs            Wheelchair Mobility    Modified Rankin (Stroke Patients Only)       Balance Overall balance assessment: Needs assistance Sitting-balance support: Bilateral upper extremity supported;Feet supported Sitting balance-Leahy Scale: Good Sitting balance - Comments: statically   Standing balance support: During functional activity;No upper extremity supported Standing balance-Leahy Scale: Poor Standing balance comment: up to [place] to maintain dynamic balance, high fall risk                             Pertinent Vitals/Pain Pain Assessment: No/denies pain    Home Living Family/patient expects to be discharged to:: Private residence Living Arrangements: Spouse/significant other (boyfriend and his two kids) Available Help at Discharge: Available PRN/intermittently;Other (Comment);Family (boyfriend) Type of Home: House Home Access: Stairs to enter Entrance Stairs-Rails: Can reach both Entrance Stairs-Number of Steps: 6 Home Layout: Two level   Additional Comments: Pt doesn't own AD, but father was disabled and her mother may have equipment like RW and canes    Prior Function Level of Independence: Independent         Comments:  poor historian and tells [place] contradictory bouts of information during session     Hand Dominance        Extremity/Trunk Assessment   Upper Extremity Assessment Upper  Extremity Assessment: Defer to OT evaluation    Lower Extremity Assessment Lower Extremity Assessment: Generalized weakness    Cervical / Trunk Assessment Cervical / Trunk Assessment: Kyphotic  Communication   Communication: No difficulties  Cognition Arousal/Alertness: Awake/alert Behavior During Therapy: WFL for tasks assessed/performed;Impulsive Overall Cognitive Status: No family/caregiver present to determine baseline cognitive functioning Area of Impairment: Orientation;Attention;Memory;Following commands;Safety/judgement;Awareness;Problem solving                 Orientation Level: Disoriented to;Place;Situation Current Attention Level: Sustained Memory: Decreased short-term memory Following Commands: Follows one step commands consistently;Follows one step commands with increased time;Follows multi-step commands inconsistently Safety/Judgement: Decreased awareness of safety;Decreased awareness of deficits Awareness: Intellectual Problem Solving: Slow processing;Decreased initiation;Difficulty sequencing;Requires verbal cues General Comments: slow processing and very poor awareness of deficts. Convinced she is at [facility] in [facility], when given cues she corrects herself to being in [place]. First tells [place] she lives with her boyfriend, later tells [place] she lives with her husband. Convinced that the events that brought her to the hospital occurred months ago.      General Comments      Exercises     Assessment/Plan    PT Assessment Patient needs continued PT services  PT Problem List Decreased strength;Decreased cognition;Decreased activity tolerance;Decreased safety awareness;Decreased balance;Decreased knowledge of precautions;Decreased mobility;Decreased coordination       PT Treatment Interventions DME instruction;Balance training;Gait training;Neuromuscular re-education;Stair training;Functional mobility training;Patient/family education;Therapeutic  exercise;Therapeutic activities    PT Goals (Current goals can be found in the Care Plan section)  Acute Rehab PT Goals Patient Stated Goal: build upper body strength PT Goal Formulation: With patient Time For Goal Achievement: 02/25/21 Potential to Achieve Goals: Fair    Frequency Min 4X/week   Barriers to discharge        Co-evaluation               AM-PAC PT "6 Clicks" Mobility  Outcome Measure Help needed turning from your back to your side while in a flat bed without using bedrails?: A Little Help needed moving from lying on your back to sitting on the side of a flat bed without using bedrails?: A Little Help needed moving to and from a bed to a chair (including a wheelchair)?: A Little Help needed standing up from a chair using your arms (e.g., wheelchair or bedside chair)?: A Little Help needed to walk in hospital room?: A Lot Help needed climbing 3-5 steps with a railing? : A Lot 6 Click Score: 16    End of Session Equipment Utilized During Treatment: Gait belt Activity Tolerance: Patient tolerated treatment well Patient left: in bed;with call bell/phone within reach;with bed alarm set Nurse Communication: Mobility status PT Visit Diagnosis: Unsteadiness on feet (R26.81);Difficulty in walking, not elsewhere classified (R26.2);Muscle weakness (generalized) (M62.81);History of falling (Z91.81)    Time: 4888-9169 PT Time Calculation (min) (ACUTE ONLY): 28 min   Charges:   PT Evaluation $PT Eval Moderate Complexity: 1 Mod PT Treatments $Gait Training: 8-22 mins        Windell Norfolk, DPT, PN1   Supplemental Physical Therapist [place]    Pager [phone] Acute Rehab Office [phone]

## 2021-02-11 NOTE — Evaluation (Addendum)
Occupational Therapy Evaluation Patient Details Name: Kelly Zuniga MRN: [id number] DOB: May 23, 1966 Today's Date: 02/11/2021    History of Present Illness 54yo female c/o dizziness and hand cramping and weakness, had recent fall in which she struck her posterior occiput. Brought to the ED from work by EMS and admitted with new onset seizures. PMH depression, HTN, substance abuse   Clinical Impression   PTA, pt lives with significant other and his young children. Pt reports being independent with all daily tasks without AD and works at [employer]. Pt presents now with deficits in standing balance, safety, cognition and speech. Pt with poor balance in standing requiring Min A for sit to stand and hands on assist throughout mobility without AD to prevent fall due to LEs giving out. Pt with difficulty problem solving, requiring Min A to brush teeth standing at sink to maintain balance. Pt requires Min A for UB ADLs and LB ADLs. Pt noted with consistent signs of aphasia, often vocalizing incorrect words in sentences similar to word that was meant to be in sentence, as well as word finding difficulties. When OT brought water back to pt, she reported, I'm a pewson that drinks water the most" instead of person. Secure chat sent to MD to request speech consult for further cognitive/language assessments.  Based on presentation and high fall risk, recommend SNF for short term rehab at this time. However, pt reports desire to return home. Pt will require hands-on assist and use of RW to prevent falls when ambulating at home, requires 24/7A.    Follow Up Recommendations  SNF;Supervision/Assistance - 24 hour (if pt decides to go home, will need 24/7 physical assist when ambulating. Would benefit from OP OT or SLP follow-up pending continued cognitive deficits.)    Equipment Recommendations  Other (comment) (RW)    Recommendations for Other Services Speech consult     Precautions / Restrictions  Precautions Precautions: Fall;Other (comment) Precaution Comments: moderately unsteady, seizure precautions Restrictions Weight Bearing Restrictions: No      Mobility Bed Mobility Overal bed mobility: Needs Assistance Bed Mobility: Supine to Sit;Sit to Supine     Supine to sit: Supervision;HOB elevated Sit to supine: Supervision;HOB elevated   General bed mobility comments: increased time, no physical assist given    Transfers Overall transfer level: Needs assistance Equipment used: None Transfers: Sit to/from Stand Sit to Stand: Min assist         General transfer comment: MinA to boost to full upright standing and gain balance, crouched posture and legs noted to give way increasing fall risk without physical support/DME    Balance Overall balance assessment: Needs assistance Sitting-balance support: Bilateral upper extremity supported;Feet supported Sitting balance-Leahy Scale: Good     Standing balance support: During functional activity;No upper extremity supported Standing balance-Leahy Scale: Poor Standing balance comment: up to ModA to maintain dynamic balance, high fall risk                           ADL either performed or assessed with clinical judgement   ADL Overall ADL's : Needs assistance/impaired Eating/Feeding: Set up;Sitting   Grooming: Minimal assistance;Standing;Oral care Grooming Details (indicate cue type and reason): Min A for oral care in standing to maintain balance without AD. Pt required problem solving cues to open toothbrush package, able to sequence remainder of task. However, once she placed toothpaste cap on tube, pt noted to also be attempting to place peri cleanser cap over toothpaste tube Upper Body  Bathing: Supervision/ safety;Sitting   Lower Body Bathing: Minimal assistance;Sit to/from stand;Sitting/lateral leans   Upper Body Dressing : Supervision/safety;Sitting   Lower Body Dressing: Minimal  assistance;Sitting/lateral leans;Sit to/from stand   Toilet Transfer: Minimal assistance;Ambulation   Toileting- Clothing Manipulation and Hygiene: Minimal assistance;Sit to/from stand;Sitting/lateral lean       Functional mobility during ADLs: Minimal assistance;Cueing for sequencing;Cueing for safety General ADL Comments: Poor standing balance with legs giving way when walking in room having to catch self. Pt with difficulty problem solving simple ADLs. Aphasia signs noted throughout conversation     Vision Baseline Vision/History: Wears glasses Patient Visual Report: No change from baseline Vision Assessment?: No apparent visual deficits     Perception     Praxis      Pertinent Vitals/Pain Pain Assessment: No/denies pain     Hand Dominance Right   Extremity/Trunk Assessment Upper Extremity Assessment Upper Extremity Assessment: Overall WFL for tasks assessed (tremulous throughout)   Lower Extremity Assessment Lower Extremity Assessment: Defer to PT evaluation   Cervical / Trunk Assessment Cervical / Trunk Assessment: Kyphotic   Communication Communication Communication: Expressive difficulties   Cognition Arousal/Alertness: Awake/alert Behavior During Therapy: WFL for tasks assessed/performed;Impulsive Overall Cognitive Status: Impaired/Different from baseline Area of Impairment: Orientation;Attention;Memory;Following commands;Safety/judgement;Awareness;Problem solving                 Orientation Level: Disoriented to;Time Current Attention Level: Selective Memory: Decreased short-term memory Following Commands: Follows one step commands consistently;Follows multi-step commands with increased time Safety/Judgement: Decreased awareness of safety;Decreased awareness of deficits Awareness: Intellectual Problem Solving: Slow processing;Decreased initiation;Difficulty sequencing;Requires verbal cues General Comments: Pt with poor awareness of deficits. Noted  word finding difficulty and signs of aphasia. Pt would confidently answer questions or make a statement though noted with incorrect replacement of words. When asked date, pt confidently says September 11, 2021 but when OT questioned, pt asks "is it not the 22nd of february?". When given water, pt reports "I'm a pewson that drinks water the most" instead of person.   General Comments  Pt able to name 3 animals that start with C quickly. Often saying one wrong word (similar to intended word) during full sentences and unaware of it. Pt endorses having the correct words in her mind but cannot get them out, unsure of accuracy of this. Pt reports no speech difficulties prior to Sunday    Exercises     Shoulder Instructions      Home Living Family/patient expects to be discharged to:: Private residence Living Arrangements: Spouse/significant other;Children (boyfriend and his 2 kids) Available Help at Discharge: Family;Available 24 hours/day Type of Home: House Home Access: Stairs to enter [employer] of Steps: 3 Entrance Stairs-Rails: Can reach both Home Layout: Two level;1/2 bath on main level;Other (Comment) (bedroom downstairs) Alternate Level Stairs-Number of Steps: 16 Alternate Level Stairs-Rails: Left Bathroom Shower/Tub: Producer, television/film/video: Standard     Home Equipment: Other (comment) (reports she has access to her dad's old DME)   Additional Comments: Pt doesn't own AD, but father was disabled and may have access to equipment like RW and canes. Reports her boyfriend can stay home with her for a few days once she gets home      Prior Functioning/Environment Level of Independence: Independent        Comments: Works at [employer], no use of AD at baseline        OT Problem List: Decreased activity tolerance;Impaired balance (sitting and/or standing);Decreased cognition;Decreased coordination;Decreased safety awareness;Decreased knowledge of  use of DME or  AE      OT Treatment/Interventions: Self-care/ADL training;Therapeutic exercise;DME and/or AE instruction;Cognitive remediation/compensation;Therapeutic activities;Patient/family education;Balance training    OT Goals(Current goals can be found in the care plan section) Acute Rehab OT Goals Patient Stated Goal: be able to go home OT Goal Formulation: With patient Time For Goal Achievement: 02/25/21 Potential to Achieve Goals: Good ADL Goals Pt Will Perform Grooming: with modified independence;standing Pt Will Transfer to Toilet: with modified independence;ambulating Pt Will Perform Toileting - Clothing Manipulation and hygiene: with modified independence;sitting/lateral leans;sit to/from stand Additional ADL Goal #1: Pt to be able to accurately answer all orientation questions with 0 errors in speech. Additional ADL Goal #2: Pt to complete 2-3 step trail making task during functional task with 0 verbal cues  OT Frequency: Min 2X/week   Barriers to D/C:            Co-evaluation              AM-PAC OT "6 Clicks" Daily Activity     Outcome Measure Help from another person eating meals?: A Little Help from another person taking care of personal grooming?: A Little Help from another person toileting, which includes using toliet, bedpan, or urinal?: A Little Help from another person bathing (including washing, rinsing, drying)?: A Little Help from another person to put on and taking off regular upper body clothing?: A Little Help from another person to put on and taking off regular lower body clothing?: A Little 6 Click Score: 18   End of Session Nurse Communication: Mobility status  Activity Tolerance: Patient tolerated treatment well Patient left: in bed;with call bell/phone within reach;with bed alarm set  OT Visit Diagnosis: Unsteadiness on feet (R26.81);Other abnormalities of gait and mobility (R26.89);Apraxia (R48.2);Other symptoms and signs involving cognitive  function;Cognitive communication deficit (R41.841)                Time: 6546-5035 OT Time Calculation (min): 31 min Charges:  OT General Charges $OT Visit: 1 Visit OT Evaluation $OT Eval Moderate Complexity: 1 Mod OT Treatments $Self Care/Home Management : 8-22 mins  Bradd Canary, OTR/L Acute Rehab Services Office: [phone]  Lorre Munroe 02/11/2021, 2:33 PM

## 2021-02-11 NOTE — TOC CAGE-AID Note (Signed)
Transition of Care [facility]) - CAGE-AID Screening   Patient Details  Name: Giavana Rooke MRN: [id number] Date of Birth: 29-Jul-1966  Transition of Care [facility]) CM/SW Contact:    Emeterio Reeve, [place] Phone Number: 02/11/2021, 10:17 AM   Clinical Narrative:  CSW met with pt at bedside. CSW introduced self and explained her role at the hospital.  Pt reports she drinks a tall glass of bourbon  and a glass of wine a day. Pt became tearful and stated "I dont know why people think im an alcoholic, Im not I just have two drinks a day". Pt reports she does not need resources at this time.   CAGE-AID Screening:    Have You Ever Felt You Ought to Cut Down on Your Drinking or Drug Use?: No Have People Annoyed You By Critizing Your Drinking Or Drug Use?: Yes Have You Felt Bad Or Guilty About Your Drinking Or Drug Use?: No Have You Ever Had a Drink or Used Drugs First Thing In The Morning to Steady Your Nerves or to Get Rid of a Hangover?: No CAGE-AID Score: 1  Substance Abuse Education Offered: Yes  Substance abuse interventions: Patient Counseling    Emeterio Reeve, Latanya Presser, [place] Social Worker [phone]

## 2021-02-12 LAB — COMPREHENSIVE METABOLIC PANEL
ALT: 40 U/L (ref 0–44)
AST: 65 U/L — ABNORMAL HIGH (ref 15–41)
Albumin: 4 g/dL (ref 3.5–5.0)
Alkaline Phosphatase: 75 U/L (ref 38–126)
Anion gap: 12 (ref 5–15)
BUN: 11 mg/dL (ref 6–20)
CO2: 20 mmol/L — ABNORMAL LOW (ref 22–32)
Calcium: 9.5 mg/dL (ref 8.9–10.3)
Chloride: 99 mmol/L (ref 98–111)
Creatinine, Ser: 0.66 mg/dL (ref 0.44–1.00)
GFR, Estimated: >60 mL/min (ref >60)
Glucose, Bld: 91 mg/dL (ref 70–99)
Potassium: 3.8 mmol/L (ref 3.5–5.1)
Sodium: 131 mmol/L — ABNORMAL LOW (ref 135–145)
Total Bilirubin: 1 mg/dL (ref 0.3–1.2)
Total Protein: 6.6 g/dL (ref 6.5–8.1)

## 2021-02-12 LAB — IRON AND TIBC
Iron: 75 ug/dL (ref 28–170)
Saturation Ratios: 20 % (ref 10.4–31.8)
TIBC: 372 ug/dL (ref 250–450)
UIBC: 297 ug/dL

## 2021-02-12 LAB — CBC
HCT: 33.3 % — ABNORMAL LOW (ref 36.0–46.0)
Hemoglobin: 11.5 g/dL — ABNORMAL LOW (ref 12.0–15.0)
MCH: 35.1 pg — ABNORMAL HIGH (ref 26.0–34.0)
MCHC: 34.5 g/dL (ref 30.0–36.0)
MCV: 101.5 fL — ABNORMAL HIGH (ref 80.0–100.0)
Platelets: 189 10*3/uL (ref 150–400)
RBC: 3.28 MIL/uL — ABNORMAL LOW (ref 3.87–5.11)
RDW: 13 % (ref 11.5–15.5)
WBC: 5.1 10*3/uL (ref 4.0–10.5)
nRBC: 0 % (ref 0.0–0.2)

## 2021-02-12 LAB — RETICULOCYTES
Immature Retic Fract: 14.8 % (ref 2.3–15.9)
RBC.: 3.27 MIL/uL — ABNORMAL LOW (ref 3.87–5.11)
Retic Count, Absolute: 99.7 10*3/uL (ref 19.0–186.0)
Retic Ct Pct: 3.1 % (ref 0.4–3.1)

## 2021-02-12 LAB — FERRITIN: Ferritin: 340 ng/mL — ABNORMAL HIGH (ref 11–307)

## 2021-02-12 LAB — FOLATE: Folate: 29.1 ng/mL (ref >5.9)

## 2021-02-12 LAB — PHOSPHORUS: Phosphorus: 3.4 mg/dL (ref 2.5–4.6)

## 2021-02-12 LAB — VITAMIN B12: Vitamin B-12: 358 pg/mL (ref 180–914)

## 2021-02-12 LAB — MAGNESIUM: Magnesium: 1.9 mg/dL (ref 1.7–2.4)

## 2021-02-12 MED ORDER — THIAMINE HCL 100 MG PO TABS: 100.0000 mg | ORAL_TABLET | Freq: Every day | ORAL | 0 refills | Status: DC

## 2021-02-12 MED ORDER — LEVETIRACETAM 500 MG PO TABS: 500.0000 mg | ORAL_TABLET | Freq: Two times a day (BID) | ORAL | 1 refills | Status: DC

## 2021-02-12 MED ORDER — TOPIRAMATE 25 MG PO TABS: 25.0000 mg | ORAL_TABLET | Freq: Two times a day (BID) | ORAL | 1 refills | Status: DC

## 2021-02-12 NOTE — Plan of Care (Signed)
  Problem: Education: Goal: Knowledge of General Education information will improve Description: Including pain rating scale, medication(s)/side effects and non-pharmacologic comfort measures Outcome: Adequate for Discharge   

## 2021-02-12 NOTE — Progress Notes (Signed)
Physical Therapy Treatment Patient Details Name: Kelly Zuniga MRN: [id number] DOB: 09/03/1966 Today's Date: 02/12/2021    History of Present Illness 54yo female c/o dizziness and hand cramping and weakness, had recent fall in which she struck her posterior occiput. Brought to the ED from work by EMS and admitted with new onset seizures. PMH depression, HTN, substance abuse    PT Comments    Pt received sitting on EOB, eager to return home. Pt's husband present for entire session. A&Ox4. Improved orientation, safety awareness, problem solving, and memory. Min guard for OOB mobility for safety including ambulation ~180 ft and 5 stairs with railings. Improved steadiness with ambulation, no evidence of scissoring gait or LOB. Pt steady without AD, but recommend RW for longer distances.  Pt and husband also educated that husband should be present and behind pt while she navigates stairs. Recommend OP PT to improve strength, endurance, and balance. Pt and husband agreeable to all education and plan. Pt left sitting on EOB with husband present, call bell within reach, and nursing aware of her status.   Follow Up Recommendations  Outpatient PT     Equipment Recommendations  Rolling walker with 5" wheels;3in1 (PT)    Recommendations for Other Services       Precautions / Restrictions Precautions Precautions: Fall;Other (comment) Precaution Comments: moderately unsteady, seizure precautions Restrictions Weight Bearing Restrictions: No    Mobility  Bed Mobility               General bed mobility comments: Pt received sitting on EOB    Transfers Overall transfer level: Modified independent   Transfers: Sit to/from Stand Sit to Stand: Modified independent (Device/Increase time)         General transfer comment: Mod I for increased time  Ambulation/Gait Ambulation/Gait assistance: Min guard Gait Distance (Feet): 180 Feet Assistive device: None Gait Pattern/deviations:  Step-through pattern;Decreased step length - right;Decreased step length - left Gait velocity: Decreased   General Gait Details: Trialed RW due to unsteadiness yesterday. Improved steadniess and non-reliance on UE support, no signs of reaching out for UE support. Walked without AD, no physical assist give, cueing for pacing. No evidence of scissoring or drifting seen yesterday. Min guard for safety   Stairs Stairs: Yes Stairs assistance: Min guard Stair Management: One rail Left;Alternating pattern;Forwards;Step to pattern Number of Stairs: 5 General stair comments: Min guard for safety. Cueing for pacing and sequencing. Alternating pattern for ascending, pt safer with step to pattern for descending   Wheelchair Mobility    Modified Rankin (Stroke Patients Only)       Balance Overall balance assessment: Needs assistance Sitting-balance support: Feet supported;No upper extremity supported Sitting balance-Leahy Scale: Good     Standing balance support: During functional activity;No upper extremity supported Standing balance-Leahy Scale: Fair Standing balance comment: Fair standing balance, would benefit from UE support for longer ambulatory distances in community                            Cognition Arousal/Alertness: Awake/alert Behavior During Therapy: [facility] for tasks assessed/performed;Impulsive Overall Cognitive Status: Within Functional Limits for tasks assessed Area of Impairment: Attention;Memory;Safety/judgement;Awareness;Problem solving                   Current Attention Level: Selective Memory: Decreased short-term memory Following Commands: Follows one step commands consistently;Follows multi-step commands with increased time Safety/Judgement: Decreased awareness of safety;Decreased awareness of deficits Awareness: Intellectual Problem Solving: Slow processing;Decreased initiation;Difficulty sequencing;Requires  verbal cues General Comments:  A&Ox4. Improved problem solving and awareness. Able to follow commands consistently and responds appropriately to questions.      Exercises      General Comments        Pertinent Vitals/Pain Pain Assessment: No/denies pain    Home Living     Available Help at Discharge: Family;Available 24 hours/day Type of Home: House              Prior Function            PT Goals (current goals can now be found in the care plan section)      Frequency    Min 3X/week      PT Plan Discharge plan needs to be updated;Frequency needs to be updated    Co-evaluation              AM-PAC PT "6 Clicks" Mobility   Outcome Measure  Help needed turning from your back to your side while in a flat bed without using bedrails?: None Help needed moving from lying on your back to sitting on the side of a flat bed without using bedrails?: None Help needed moving to and from a bed to a chair (including a wheelchair)?: None Help needed standing up from a chair using your arms (e.g., wheelchair or bedside chair)?: None Help needed to walk in hospital room?: A Little Help needed climbing 3-5 steps with a railing? : A Little 6 Click Score: 22    End of Session Equipment Utilized During Treatment: Gait belt Activity Tolerance: Patient tolerated treatment well Patient left: in bed;with call bell/phone within reach;with family/visitor present Nurse Communication: Mobility status PT Visit Diagnosis: Unsteadiness on feet (R26.81);Difficulty in walking, not elsewhere classified (R26.2);Muscle weakness (generalized) (M62.81);History of falling (Z91.81)     Time:  -     Charges:                       Conley Rolls, SPT

## 2021-02-12 NOTE — Progress Notes (Signed)
Discharge instructions (including medications) discussed with and copy provided to patient/caregiver 

## 2021-02-12 NOTE — TOC Initial Note (Signed)
Transition of Care [facility]) - Initial/Assessment Note    Patient Details  Name: Kelly Zuniga MRN: [id number] Date of Birth: 03/21/1966  Transition of Care [facility]) CM/SW Contact:    Kingsley Plan, RN Phone Number: 02/12/2021, 10:34 AM  Clinical Narrative:                 Patient from home with husband.   She can get a walker from her mother ( was her father's).   OP PT,SP arranged at [facility]. Confirmed patient's contact information. Placed on AVS  Expected Discharge Plan: Home/Self Care Barriers to Discharge: No Barriers Identified   Patient Goals and CMS Choice Patient states their goals for this hospitalization and ongoing recovery are:: to go home CMS Medicare.gov Compare Post Acute Care list provided to:: Patient Choice offered to / list presented to : Patient  Expected Discharge Plan and Services Expected Discharge Plan: Home/Self Care   Discharge Planning Services: CM Consult Post Acute Care Choice:  (OP PT SP) Living arrangements for the past 2 months: Single Family Home                 DME Arranged: N/A         HH Arranged: NA          Prior Living Arrangements/Services Living arrangements for the past 2 months: Single Family Home Lives with:: Spouse Patient language and need for interpreter reviewed:: Yes Do you feel safe going back to the place where you live?: Yes      Need for Family Participation in Patient Care: Yes (Comment) Care giver support system in place?: Yes (comment)   Criminal Activity/Legal Involvement Pertinent to Current Situation/Hospitalization: No - Comment as needed  Activities of Daily Living Home Assistive Devices/Equipment: None ADL Screening (condition at time of admission) Patient's cognitive ability adequate to safely complete daily activities?: Yes Is the patient deaf or have difficulty hearing?: No Does the patient have difficulty seeing, even when wearing glasses/contacts?: No Does the patient have difficulty  concentrating, remembering, or making decisions?: Yes Patient able to express need for assistance with ADLs?: Yes Does the patient have difficulty dressing or bathing?: No Independently performs ADLs?: Yes (appropriate for developmental age) Does the patient have difficulty walking or climbing stairs?: Yes Weakness of Legs: Both Weakness of Arms/Hands: None  Permission Sought/Granted   Permission granted to share information with : No              Emotional Assessment Appearance:: Appears stated age Attitude/Demeanor/Rapport: Engaged Affect (typically observed): Accepting Orientation: : Oriented to Self,Oriented to Place,Oriented to  Time,Oriented to Situation Alcohol / Substance Use: Not Applicable Psych Involvement: No (comment)  Admission diagnosis:  Seizure (HCC) [R56.9] Elevated LFTs [R79.89] Syncope, unspecified syncope type [R55] 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 Hypomagnesemia 02/10/2021  . Generalized anxiety disorder 02/10/2021  . Insomnia 02/10/2021  . Fall due to seizure (HCC) 02/10/2021  . Head injury 02/10/2021  . Macrocytic anemia 02/10/2021  . 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 Seizure (HCC) 02/09/2021  . Elevated LFTs 02/09/2021  . Substance abuse (HCC) 02/18/2013  . Alcohol dependence (HCC) 02/18/2013  . Headache disorder 02/26/2012  . 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 MDD (major depressive disorder) 12/26/2010  . HTN (hypertension) 12/26/2010  . ALLERGIC RHINITIS 12/26/2010  . Asthma 12/26/2010   PCP:  Kelly Agee, NP Pharmacy:   [facility] [place], [address] [place] [address] Phone: [phone] Fax: [phone]     Social Determinants of Health (SDOH) Interventions    Readmission Risk  Interventions No flowsheet data found.

## 2021-02-12 NOTE — Discharge Summary (Addendum)
Physician Discharge Summary  Kelly Zuniga QMG:[id number] DOB: 01/26/1966 DOA: 02/09/2021  PCP: [name], NP  Admit date: 02/09/2021 Discharge date: 02/12/2021  Admitted From: home Discharge disposition: home   Recommendations for Outpatient Follow-Up:   Seizure precautions keppra for 2-3 months-- neurology referral placed Alcohol cessation Follow LFTs BMP 1 week re: Na  Discharge Diagnosis:   Principal Problem:   Seizure (HCC) Active Problems:   MDD (major depressive disorder)   HTN (hypertension)   Asthma   Substance abuse (HCC)   Alcohol dependence (HCC)   Holmes-Adie syndrome   Elevated LFTs   Hypomagnesemia   Generalized anxiety disorder   Insomnia   Fall due to seizure [facility])   Head injury   Macrocytic anemia      Discharge Condition: Improved.  Diet recommendation:  Regular.  Wound care: None.  Code status: Full.   History of Present Illness:   Kelly Zuniga is a 55 y.o. female with medical history significant of alcohol abuse, astma,anxiety/depression, HTN reprts 3-4 days of room spinning dizziness. At work today she had continued dizziness, hand cramping and weakness. She had a brief episode of LOC falling and striking posterior occiput. Coworkers witness some whole body shaking lasting several seconds. EMS activated and patient transported to [facility] for evaluation. .  ED Course: T 98.1  112/55  84  16. ED-PA exam notable for horizontal nystagmus bilaterally. CT head w/o acute findings, MRI brain w/o acute findings. Lab reveals AST 111, ALT 52 (previous elevastion recorded at outpatient evaluation), CK 125 (nl), Troponin 7, CBC nl. In ED when she moved to sitting position she had increased dizziness along with nausea. On return from MRI patient had a witnessed event where she was unresponsive, had abnormal movement, lateral gaze w/o generalized tonic-clonic movement. She remained post-ictal for 1 hr. She was not considered to be at her  baseline. Neurology was consulted by ED-PA and Keppra load and dosing recommended along with admission due to persistent mental status change.    Hospital Course by Problem:   Seizure, associated vertigo, POA-neurology consulted, appreciate input.  MRI and CT were negative for acute findings.  EEG was done yesterday showed no seizures or epileptiform discharges.  Patient restarted on Keppra, continue per neuro for 2-3 months  Alcohol abuse, significant withdrawal- -CIWA much improved this AM-- tremor gone per patient  Acute metabolic encephalopathy, confusion -resolved  Major depressive disorder/generalized anxiety disorder/insomnia-follows with psychiatry as an outpatient.  Continue Topamax, Klonopin, Lexapro.  Discontinue Wellbutrin given seizures  Elevated LFTs-improving, right upper quadrant ultrasound did showed CBD dilatation but she is completely asymptomatic, no epigastric or right upper quadrant pain, no nausea no vomiting and tolerating diet well.  Bilirubin is normal.  Hypokalemia, hypomagnesemia  -repleted  Hyponatremia -recheck outpatient  Microcytic anemia-likely due to EtOH use, B12 was on the lower side of normal in 2014 but wnl this admission. Continue thiamine/folic acid  Deconditioning-patient seen by physical therapy today, recommends SNF initally but much improved on day of d/c- outpatient PT     Medical Consultants:    neurology  Discharge Exam:   Vitals:   02/12/21 0536 02/12/21 1122  BP: (!) 142/85 (!) 145/97  Pulse: 91 (!) 109  Resp: 18 16  Temp: 98 F (36.7 C) 98.4 F (36.9 C)  SpO2: 100% 98%   Vitals:   02/11/21 1722 02/11/21 2320 02/12/21 0536 02/12/21 1122  BP: (!) 150/89 139/85 (!) 142/85 (!) 145/97  Pulse: 97 85 91 (!) 109  Resp: 17 18 18 16   Temp: 98.4 F (36.9 C) 98.9 F (37.2 C) 98 F (36.7 C) 98.4 F (36.9 C)  TempSrc: Oral Oral Oral Oral  SpO2: 97% 97% 100% 98%  Weight:      Height:        General exam: Appears  calm and comfortable.  A+OX3  The results of significant diagnostics from this hospitalization (including imaging, microbiology, ancillary and laboratory) are listed below for reference.     Procedures and Diagnostic Studies:   EEG now  Result Date: Feb 28, 2021 02/12/2021, MD     Feb 28, 2021 12:34 PM Patient Name: Kelly Zuniga MRN: Sherrye Payor Epilepsy Attending: [id number] Referring Physician/Provider: Dr. Charlsie Quest Date: 02/28/21 Duration: 23.50 minutes Patient history: 55 year old female with new onset seizure.  EEG to evaluate for seizures. Level of alertness: Awake, asleep AEDs during EEG study: Keppra, topiramate Technical aspects: This EEG study was done with scalp electrodes positioned according to the 10-20 International system of electrode placement. Electrical activity was acquired at a sampling rate of 500Hz  and reviewed with a high frequency filter of 70Hz  and a low frequency filter of 1Hz . EEG data were recorded continuously and digitally stored. Description: The posterior dominant rhythm consists of 9-10 Hz activity of moderate voltage (25-35 uV) seen predominantly in posterior head regions, symmetric and reactive to eye opening and eye closing. Sleep was characterized by vertex waves, sleep spindles (12 to 14 Hz), maximal frontocentral region. Physiologic photic driving was seen during photic stimulation.  Hyperventilation was not performed.   IMPRESSION: This study is within normal limits. No seizures or epileptiform discharges were seen throughout the recording. 40   CT Head Wo Contrast  Result Date: 02/09/2021 CLINICAL DATA:  Dizziness for a few days. Status post syncope and possible seizure today. EXAM: CT HEAD WITHOUT CONTRAST TECHNIQUE: Contiguous axial images were obtained from the base of the skull through the vertex without intravenous contrast. COMPARISON:  Head CT 09/15/2020. FINDINGS: Brain: No evidence of acute infarction, hemorrhage,  hydrocephalus, extra-axial collection or mass lesion/mass effect. Cortical atrophy is unchanged. Vascular: No hyperdense vessel or unexpected calcification. Skull: Intact.  No focal lesion. Sinuses/Orbits: Mild mucosal thickening inferior aspect of the left maxillary sinus is noted. Otherwise negative. Other: Scalp contusion on the right is seen. IMPRESSION: No acute intracranial normality. Cortical atrophy. Scalp contusion on the right. Electronically Signed   By: M.D.   On: 02/09/2021 14:17   MR BRAIN WO CONTRAST  Result Date: 02/09/2021 CLINICAL DATA:  Fall with seizure activity. EXAM: MRI HEAD WITHOUT CONTRAST TECHNIQUE: Multiplanar, multiecho pulse sequences of the brain and surrounding structures were obtained without intravenous contrast. COMPARISON:  Head CT 02/09/2021 FINDINGS: Brain: There is no evidence of an acute infarct, intracranial hemorrhage, mass, midline shift, or extra-axial fluid collection. Scattered small T2 hyperintensities in the cerebral white matter bilaterally are nonspecific but compatible with mild chronic small vessel ischemic disease. There is mild cerebral atrophy. Vascular: Major intracranial vascular flow voids are preserved. Skull and upper cervical spine: Unremarkable bone marrow signal. Small right parietal scalp hematoma. Sinuses/Orbits: Unremarkable orbits. Mild mucosal thickening in the left maxillary sinus. Clear mastoid air cells. Other: None. IMPRESSION: 1. No acute intracranial abnormality. 2. Mild chronic small vessel ischemic disease and cerebral atrophy. 3. Small right parietal scalp hematoma. Electronically Signed   By: Drusilla Kanner M.D.   On: 02/09/2021 16:22   02/11/2021 Abdomen Complete  Result Date: 02/28/21 CLINICAL DATA:  Elevated LFTs EXAM: ABDOMEN  ULTRASOUND COMPLETE COMPARISON:  None. FINDINGS: Gallbladder: No gallstones or wall thickening visualized. Altered mental status limits evaluation of a sonographic Murphy sign. Common bile duct:  Diameter: 10.1 mm, dilated Liver: Diffusely increased hepatic echogenicity with loss of definition of the portal triads and diminished posterior through transmission compatible with hepatic steatosis. No focal liver lesion is seen. Portal vein is patent on color Doppler imaging with normal direction of blood flow towards the liver. IVC: No abnormality visualized. Pancreas: Suboptimally visualized.  Visualized portion unremarkable. Spleen: Size and appearance within normal limits. Right Kidney: Length: 10.1 cm. Echogenicity within normal limits. No mass or hydronephrosis visualized. Left Kidney: Length: 9.9 cm. Echogenicity within normal limits. No mass or hydronephrosis visualized. Abdominal aorta: Incomplete visualization. Atherosclerotic calcifications are present. Maximal AP diameter of the visualized aorta measures 2.3 cm. Other findings: Technically challenging exam due to altered mental status. IMPRESSION: 1. Technically challenging exam due to patient's altered mental status. 2. Dilated common bile duct measuring up to 10.1 mm. Recommend correlation with serum bilirubin levels and if there is derangement/concern for choledocholithiasis, MRCP could be obtained. This recommendation follows ACR consensus guidelines: White Paper of the ACR Incidental Findings Committee II on Gallbladder and Biliary Findings. J Am Coll Radiol 2013:;10:953-956. 3. Diffusely increased hepatic echogenicity, most commonly seen with hepatic steatosis. No focal liver lesion is seen. 4.  Aortic Atherosclerosis (ICD10-I70.0). Electronically Signed   By: Kreg Shropshire M.D.   On: 02/10/2021 05:54     Labs:   Basic Metabolic Panel: Recent Labs  Lab 02/09/21 1245 02/09/21 2016 02/11/21 0443 02/12/21 0455  NA 140  --  130* 131*  K 4.4  --  3.3* 3.8  CL 101  --  99 99  CO2 25  --  19* 20*  GLUCOSE 97  --  78 91  BUN 16  --  8 11  CREATININE 0.74  --  0.71 0.66  CALCIUM 9.6  --  8.6* 9.5  MG  --  1.5* 1.7 1.9  PHOS  --  3.8   --  3.4   GFR Estimated Creatinine Clearance: 58.4 mL/min (by C-G formula based on SCr of 0.66 mg/dL). Liver Function Tests: Recent Labs  Lab 02/09/21 1245 02/11/21 0443 02/12/21 0455  AST 111* 47* 65*  ALT 52* 31 40  ALKPHOS 68 64 75  BILITOT 0.9 1.1 1.0  PROT 7.0 5.9* 6.6  ALBUMIN 4.6 3.5 4.0   Recent Labs  Lab 02/09/21 1633  LIPASE 59*   No results for input(s): AMMONIA in the last 168 hours. Coagulation profile No results for input(s): INR, PROTIME in the last 168 hours.  CBC: Recent Labs  Lab 02/09/21 1245 02/12/21 0455  WBC 5.1 5.1  NEUTROABS 3.5  --   HGB 11.1* 11.5*  HCT 34.5* 33.3*  MCV 104.9* 101.5*  PLT 191 189   Cardiac Enzymes: Recent Labs  Lab 02/09/21 1245  CKTOTAL 125   BNP: Invalid input(s): POCBNP CBG: Recent Labs  Lab 02/09/21 1245 02/09/21 1718  GLUCAP 88 102*   D-Dimer No results for input(s): DDIMER in the last 72 hours. Hgb A1c No results for input(s): HGBA1C in the last 72 hours. Lipid Profile No results for input(s): CHOL, HDL, LDLCALC, TRIG, CHOLHDL, LDLDIRECT in the last 72 hours. Thyroid function studies No results for input(s): TSH, T4TOTAL, T3FREE, THYROIDAB in the last 72 hours.  Invalid input(s): FREET3 Anemia work up Recent Labs    02/12/21 0455  VITAMINB12 358  FOLATE 29.1  FERRITIN 340*  TIBC 372  IRON 75  RETICCTPCT 3.1   Microbiology Recent Results (from the past 240 hour(s))  SARS CORONAVIRUS 2 (TAT 6-24 HRS) Nasopharyngeal Nasopharyngeal Swab     Status: None   Collection Time: 02/09/21  6:42 PM   Specimen: Nasopharyngeal Swab  Result Value Ref Range Status   SARS Coronavirus 2 NEGATIVE NEGATIVE Final    Comment: (NOTE) SARS-CoV-2 target nucleic acids are NOT DETECTED.  The SARS-CoV-2 RNA is generally detectable in upper and lower respiratory specimens during the acute phase of infection. Negative results do not preclude SARS-CoV-2 infection, do not rule out co-infections with other  pathogens, and should not be used as the sole basis for treatment or other patient management decisions. Negative results must be combined with clinical observations, patient history, and epidemiological information. The expected result is Negative.  Fact Sheet for Patients: HairSlick.nohttps://www.fda.gov/media/138098/download  Fact Sheet for Healthcare Providers: quierodirigir.comhttps://www.fda.gov/media/138095/download  This test is not yet approved or cleared by the [place] FDA and  has been authorized for detection and/or diagnosis of SARS-CoV-2 by FDA under an Emergency Use Authorization (EUA). This EUA will remain  in effect (meaning this test can be used) for the duration of the COVID-19 declaration under Se ction 564(b)(1) of the Act, 21 U.S.C. section 360bbb-3(b)(1), unless the authorization is terminated or revoked sooner.  Performed at [facility], [address] [place], [place] [address]      Discharge Instructions:   Discharge Instructions    Ambulatory referral to Neurology   Complete by: As directed    An appointment is requested in approximately: 1 week   Ambulatory referral to Physical Therapy   Complete by: As directed    Ambulatory referral to Speech Therapy   Complete by: As directed    Diet general   Complete by: As directed    Discharge instructions   Complete by: As directed    Per [place] statutes, patients with seizures are not allowed to drive until they have been seizure-free for six months.   Use caution when using heavy equipment or power tools. Avoid working on ladders or at heights. Take showers instead of baths. Ensure the water temperature is not too high on the home water heater. Do not go swimming alone. Do not lock yourself in a room alone (i.e. bathroom). When caring for infants or small children, sit down when holding, feeding, or changing them to minimize risk of injury to the child in the event you have a seizure. Maintain good sleep  hygiene. Avoid alcohol.   If patienthas another seizure, call 911 and bring them back to the ED if: A. The seizure lasts longer than 5 minutes.  B. The patient doesn't wake shortly after the seizure or has new problems such as difficulty seeing, speaking or moving following the seizure C. The patient was injured during the seizure D. The patient has a temperature over 102 F (39C) E. The patient vomited during the seizure and now is having trouble breathing   Continue keppra 500 mg bid for 3-6 months until seizure free   Increase activity slowly   Complete by: As directed      Allergies as of 02/12/2021      Reactions   Acetaminophen Other (See Comments)   Makes LFT get elevated and causes liver problems/issues   Lithium Other (See Comments)   Causes the patient to have no appetite   Promethazine-codeine Nausea And Vomiting, Other (See Comments)   Any codeine-based cough  syrups cause N/V   Codeine Nausea And Vomiting   Morphine And Related Nausea And Vomiting      Medication List    STOP taking these medications   buPROPion 300 MG 24 hr tablet Commonly known as: Wellbutrin XL   fluticasone 50 MCG/ACT nasal spray Commonly known as: FLONASE   multivitamin with minerals Tabs tablet   promethazine 25 MG tablet Commonly known as: PHENERGAN     TAKE these medications   albuterol (2.5 MG/3ML) 0.083% nebulizer solution Commonly known as: PROVENTIL Take 2.5 mg by nebulization every 6 (six) hours as needed for wheezing or shortness of breath.   albuterol 108 (90 Base) MCG/ACT inhaler Commonly known as: VENTOLIN HFA Inhale 2 puffs into the lungs See admin instructions. Inhale 2 puffs into the lungs every 4-6 hours as needed for shortness of breath or wheezing   BIOTIN PO Take 1 tablet by mouth in the morning.   clonazePAM 0.5 MG tablet Commonly known as: KLONOPIN Take 1 tablet (0.5 mg total) by mouth 3 (three) times daily as needed for anxiety.   escitalopram 10  MG tablet Commonly known as: LEXAPRO Take one tablet daily. What changed:   how much to take  how to take this  when to take this  additional instructions   fexofenadine 180 MG tablet Commonly known as: ALLEGRA Take 180 mg by mouth in the morning.   folic acid 1 MG tablet Commonly known as: FOLVITE Take 1 tablet (1 mg total) by mouth daily. (Can be purchased from over the counter) :For Folic acid deficiency   levETIRAcetam 500 MG tablet Commonly known as: KEPPRA Take 1 tablet (500 mg total) by mouth 2 (two) times daily.   Multi Adult Gummies Chew Chew 2 tablets by mouth in the morning.   naproxen sodium 220 MG tablet Commonly known as: ALEVE Take 220 mg by mouth 2 (two) times daily as needed (for mild pain).   thiamine 100 MG tablet Take 1 tablet (100 mg total) by mouth daily.   topiramate 25 MG tablet Commonly known as: TOPAMAX Take 1 tablet (25 mg total) by mouth 2 (two) times daily. What changed: when to take this   triamcinolone 55 MCG/ACT nasal inhaler Commonly known as: NASACORT Place 2 sprays into the nose daily. For allergies What changed:   when to take this  additional instructions       Follow-up Information    Schedule an appointment as soon as possible for a visit  with [name], NP.   Specialty: Adult Health Nurse Practitioner Contact information: [address] [place] [address] [phone]        [facility] EMERGENCY DEPARTMENT.   Specialty: Emergency Medicine Why: If symptoms worsen Contact information: [address] [id number] mc [place] [address] [phone]       [facility] Follow up.   Specialty: Rehabilitation Contact information: [address] [id number] mc [place] [address] [phone]               Time coordinating discharge: 35 min  Signed:  Joseph Art DO  [facility] 02/14/2021, 1:21 PM

## 2021-02-12 NOTE — Evaluation (Signed)
Speech Language Pathology Evaluation Patient Details Name: Kele Withem MRN: [id number] DOB: 1966/08/25 Today's Date: 02/12/2021 Time: 1005-1020 SLP Time Calculation (min) (ACUTE ONLY): 15 min  Problem List:  Patient Active Problem List   Diagnosis Date Noted  . Hypomagnesemia 02/10/2021  . Generalized anxiety disorder 02/10/2021  . Insomnia 02/10/2021  . Fall due to seizure (HCC) 02/10/2021  . Head injury 02/10/2021  . Macrocytic anemia 02/10/2021  . 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 Seizure (HCC) 02/09/2021  . Elevated LFTs 02/09/2021  . Substance abuse (HCC) 02/18/2013  . Alcohol dependence (HCC) 02/18/2013  . Headache disorder 02/26/2012  . 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 MDD (major depressive disorder) 12/26/2010  . HTN (hypertension) 12/26/2010  . ALLERGIC RHINITIS 12/26/2010  . Asthma 12/26/2010   Past Medical History:  Past Medical History:  Diagnosis Date  . Alcohol abuse   . Allergy   . Anxiety   . Asthma   . Asthma    Phreesia 11/09/2020  . Depression    posibly bipolar disorder. Sees Dr [name] for psychiatric care and Dr [name] for therapy  . Depression    Phreesia 11/09/2020  . Headache(784.0)    last migraine 2004  . 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 Hypertension    never on meds   . Seizure (HCC) 02/09/2021  . Substance abuse (HCC)    Phreesia 11/09/2020   Past Surgical History:  Past Surgical History:  Procedure Laterality Date  . benign cyst     removed from left cheek  . TONSILECTOMY, ADENOIDECTOMY, BILATERAL MYRINGOTOMY AND TUBES     HPI:  55yo female c/o dizziness and hand cramping and weakness, had recent fall in which she struck her posterior occiput. Brought to the ED from work by EMS and admitted with new onset seizures. PMH depression, HTN, substance abuse   Assessment / Plan / Recommendation Clinical Impression  Patient presents with a moderate cognitive impairment as  per this assessment via [facility] [employer] Mental Status exam) for which she received a score of 19, placing her in scoring range for 'Dementia' (scores of 1-20). She was fully oriented to time, place, situation and her impairments primarily consisted of decreased delayed recall, decreased attention and impulsivity. Patient did seem aware of memory difficulties, stating, "I'm usually pretty good with that" when completing 5 word delayed recall. Some errors were related to her poor attention and impulsivity, such as drawing clock at 10 minutes after 11 instead of 10 minutes to eleven even though she said "10 minutes to 11" while drawing it. Her speech was rapid but language was intact. When SLP informed her he was recommending some short term ST, she said "to work on memory?" and then reported she had had "psychotic therapy" before. Patient was in agreement for [facility] ST in addition to OT and PT already recommending HH therapies. (initially recommendation from PT and OT was for SNF, however patient is requesting discharge home.)    SLP Assessment  SLP Recommendation/Assessment: All further Speech Lanaguage Pathology  needs can be addressed in the next venue of care SLP Visit Diagnosis: Cognitive communication deficit (R41.841)    Follow Up Recommendations  Home health SLP    Frequency and Duration     N/A      SLP Evaluation Cognition  Overall Cognitive Status: Impaired/Different from baseline Orientation Level: Oriented X4 Attention: Sustained Sustained Attention: Impaired Sustained Attention Impairment: Verbal basic;Verbal complex Memory: Impaired Memory Impairment: Other (comment);Storage deficit;Retrieval deficit (  recalled 2/5 words) Behaviors: Impulsive;Restless Safety/Judgment: Appears intact       Comprehension  Auditory Comprehension Overall Auditory Comprehension: Appears within functional limits for tasks assessed    Expression Expression Primary Mode of Expression:  Verbal Verbal Expression Overall Verbal Expression: Appears within functional limits for tasks assessed   Oral / Motor  Oral Motor/Sensory Function Overall Oral Motor/Sensory Function: Within functional limits Motor Speech Overall Motor Speech: Appears within functional limits for tasks assessed Respiration: Within functional limits Resonance: Within functional limits Articulation: Within functional limitis Intelligibility: Intelligible Motor Planning: Witnin functional limits   GO            Angela Nevin, MA, CCC-SLP Speech Therapy [facility]

## 2021-02-17 ENCOUNTER — Telehealth: Payer: Self-pay | Admitting: Adult Health

## 2021-02-17 NOTE — Telephone Encounter (Signed)
Pt called and left a message stating that she was recently in the hospital and wants to talk to gina. She said it was personal. Please give her a call at [phone]

## 2021-02-17 NOTE — Telephone Encounter (Signed)
Called and spoke with patient. Recent admission to [facility] after having seizures. Wellbutrin discontinued.

## 2021-02-24 ENCOUNTER — Ambulatory Visit (INDEPENDENT_AMBULATORY_CARE_PROVIDER_SITE_OTHER): Payer: No Typology Code available for payment source | Admitting: Registered Nurse

## 2021-02-24 ENCOUNTER — Other Ambulatory Visit: Payer: Self-pay

## 2021-02-24 ENCOUNTER — Encounter: Payer: Self-pay | Admitting: Registered Nurse

## 2021-02-24 VITALS — BP 156/90 | HR 90 | Temp 98.6°F | Resp 18 | Ht 60.0 in | Wt 91.8 lb

## 2021-02-24 DIAGNOSIS — R7989 Other specified abnormal findings of blood chemistry: Secondary | ICD-10-CM

## 2021-02-24 DIAGNOSIS — I69398 Other sequelae of cerebral infarction: Secondary | ICD-10-CM | POA: Diagnosis not present

## 2021-02-24 DIAGNOSIS — Z09 Encounter for follow-up examination after completed treatment for conditions other than malignant neoplasm: Secondary | ICD-10-CM | POA: Diagnosis not present

## 2021-02-24 DIAGNOSIS — R569 Unspecified convulsions: Secondary | ICD-10-CM | POA: Diagnosis not present

## 2021-02-24 DIAGNOSIS — F1910 Other psychoactive substance abuse, uncomplicated: Secondary | ICD-10-CM

## 2021-02-24 DIAGNOSIS — F10239 Alcohol dependence with withdrawal, unspecified: Secondary | ICD-10-CM

## 2021-02-24 NOTE — Patient Instructions (Signed)
° ° ° °  If you have lab work done today you will be contacted with your lab results within the next 2 weeks.  If you have not heard from us then please contact us. The fastest way to get your results is to register for My Chart. ° ° °IF you received an x-ray today, you will receive an invoice from [facility]. Please contact [facility] at [phone] with questions or concerns regarding your invoice.  ° °IF you received labwork today, you will receive an invoice from [facility]. Please contact [facility] at [phone] with questions or concerns regarding your invoice.  ° °Our billing staff will not be able to assist you with questions regarding bills from these companies. ° °You will be contacted with the lab results as soon as they are available. The fastest way to get your results is to activate your My Chart account. Instructions are located on the last page of this paperwork. If you have not heard from us regarding the results in 2 weeks, please contact this office. °  ° ° ° °

## 2021-02-24 NOTE — Progress Notes (Signed)
Established Patient Office Visit  Subjective:  Patient ID: Kelly Zuniga, female    DOB: Jul 28, 1966  Age: 55 y.o. MRN: [id number]  CC:  Chief Complaint  Patient presents with  . Hospitalization Follow-up    Patient states she was hospitalized 2 weeks for seizures that just started. She thinks she had another seizure on Sunday night.    HPI Kelly Zuniga presents for hospital follow up   In brief: had 3-4 days of dizziness then, while at work, collapsed and was witnessed to have seizure like activity. Was transported to ER by EMS. Imaging of head negative. Labs showed some concerns that may have elicited seizure. Of note, pt does have hx of etoh abuse and while her drinking has greatly diminished, she has been binging more lately. Unsure of last binge prior to seizure. Hospital stay of 2 weeks, started on keppra 500mg  PO bid, tolerated well. Asked to follow up with PCP for labs and with Dr. at [facility] when possible - scheduled for late April. Will follow soon with PT, OT, and SLP.   Pt today expressed frustration at the uncertainty she faces regarding etiology for these seizures. Has unfortunately binged etoh once since discharge as she has had trouble coping with the anxiety. Notes that she has been taken off of wellbutrin all together, and has not used klonopin in months - works with [facility] for management of these. Otherwise, no apparent risk factors for seizure. Labs shows elevated LFTs and electrolyte imbalance in the hospital. Imaging showed hepatic enlargement most consistent with steatosis, though reportedly a difficult exam, would have concern for alcoholic cirrhosis. Will consider repeat imaging if LFTs remain substantially elevated.   Past Medical History:  Diagnosis Date  . Alcohol abuse   . Allergy   . Anxiety   . Asthma   . Asthma    Phreesia 11/09/2020  . Depression    posibly bipolar disorder. Sees Dr 11/11/2020 for psychiatric care and Dr [name] for therapy  . Depression    Phreesia 11/09/2020  . Headache(784.0)    last migraine 2004  . 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 Hypertension    never on meds   . Seizure (HCC) 02/09/2021  . Substance abuse (HCC)    Phreesia 11/09/2020    Past Surgical History:  Procedure Laterality Date  . benign cyst     removed from left cheek  . TONSILECTOMY, ADENOIDECTOMY, BILATERAL MYRINGOTOMY AND TUBES      Family History  Problem Relation Age of Onset  . Coronary artery disease Other        1st degree relative female <50  . Depression Other        Family Hx  . Diabetes Other        Family Hx 1st degree relative  . Ovarian cancer Other        Family Hx   . Healthy Mother   . Diabetes Father   . Gout Father   . Hypertension Father   . Heart failure Father   . Cancer Father     Social History   Socioeconomic History  . Marital status: Significant Other    Spouse name: Not on file  . Number of children: 2  . Years of education: Not on file  . Highest education level: Not on file  Occupational History  . Not on file  Tobacco Use  . Smoking status: Current Every Day Smoker    Packs/day: 0.50    Types:  Cigarettes  . Smokeless tobacco: Never Used  . Tobacco comment: Using the E cigarette to help her quit & still smoking  Vaping Use  . Vaping Use: Never used  Substance and Sexual Activity  . Alcohol use: Yes    Comment: 1 glass of wine per day or less  . Drug use: No  . Sexual activity: Not on file  Other Topics Concern  . Not on file  Social History Narrative  . Not on file   Social Determinants of Health   Financial Resource Strain: Not on file  Food Insecurity: Not on file  Transportation Needs: Not on file  Physical Activity: Not on file  Stress: Not on file  Social Connections: Not on file  Intimate Partner Violence: Not on file    Outpatient Medications Prior to Visit  Medication Sig Dispense Refill  . albuterol (PROVENTIL) (2.5 MG/3ML) 0.083%  nebulizer solution Take 2.5 mg by nebulization every 6 (six) hours as needed for wheezing or shortness of breath.    Marland Kitchen albuterol (VENTOLIN HFA) 108 (90 Base) MCG/ACT inhaler Inhale 2 puffs into the lungs See admin instructions. Inhale 2 puffs into the lungs every 4-6 hours as needed for shortness of breath or wheezing    . BIOTIN PO Take 1 tablet by mouth in the morning.    . clonazePAM (KLONOPIN) 0.5 MG tablet Take 1 tablet (0.5 mg total) by mouth 3 (three) times daily as needed for anxiety. 90 tablet 2  . escitalopram (LEXAPRO) 10 MG tablet Take one tablet daily. (Patient taking differently: Take 10 mg by mouth in the morning.) 30 tablet 5  . fexofenadine (ALLEGRA) 180 MG tablet Take 180 mg by mouth in the morning.    . folic acid (FOLVITE) 1 MG tablet Take 1 tablet (1 mg total) by mouth daily. (Can be purchased from over the counter) :For Folic acid deficiency 30 tablet 0  . levETIRAcetam (KEPPRA) 500 MG tablet Take 1 tablet (500 mg total) by mouth 2 (two) times daily. 60 tablet 1  . Multiple Vitamins-Minerals (MULTI ADULT GUMMIES) CHEW Chew 2 tablets by mouth in the morning.    . naproxen sodium (ALEVE) 220 MG tablet Take 220 mg by mouth 2 (two) times daily as needed (for mild pain).    Marland Kitchen thiamine 100 MG tablet Take 1 tablet (100 mg total) by mouth daily. 30 tablet 0  . topiramate (TOPAMAX) 25 MG tablet Take 1 tablet (25 mg total) by mouth 2 (two) times daily. 60 tablet 1  . triamcinolone (NASACORT) 55 MCG/ACT nasal inhaler Place 2 sprays into the nose daily. For allergies (Patient taking differently: Place 2 sprays into the nose in the morning.) 1 Inhaler 11   No facility-administered medications prior to visit.    Allergies  Allergen Reactions  . Acetaminophen Other (See Comments)    Makes LFT get elevated and causes liver problems/issues   . Lithium Other (See Comments)    Causes the patient to have no appetite  . Promethazine-Codeine Nausea And Vomiting and Other (See Comments)     Any codeine-based cough syrups cause N/V  . Codeine Nausea And Vomiting  . Morphine And Related Nausea And Vomiting    ROS Review of Systems  Constitutional: Negative.   HENT: Negative.   Eyes: Negative.   Respiratory: Negative.   Cardiovascular: Negative.   Gastrointestinal: Negative.   Genitourinary: Negative.   Musculoskeletal: Negative.   Skin: Negative.   Neurological: Negative.   Psychiatric/Behavioral: Negative.   All other systems  reviewed and are negative.     Objective:    Physical Exam Vitals and nursing note reviewed.  Constitutional:      General: She is not in acute distress.    Appearance: Normal appearance. She is normal weight. She is not ill-appearing, toxic-appearing or diaphoretic.  Cardiovascular:     Rate and Rhythm: Normal rate and regular rhythm.     Heart sounds: Normal heart sounds. No murmur heard. No friction rub. No gallop.   Pulmonary:     Effort: Pulmonary effort is normal. No respiratory distress.     Breath sounds: Normal breath sounds. No stridor. No wheezing, rhonchi or rales.  Chest:     Chest wall: No tenderness.  Skin:    General: Skin is warm and dry.     Coloration: Skin is jaundiced.  Neurological:     General: No focal deficit present.     Mental Status: She is alert and oriented to person, place, and time. Mental status is at baseline.  Psychiatric:        Mood and Affect: Mood normal.        Behavior: Behavior normal.        Thought Content: Thought content normal.        Judgment: Judgment normal.     BP (!) 156/90   Pulse 90   Temp 98.6 F (37 C) (Temporal)   Resp 18   Ht 5' (1.524 m)   Wt 91 lb 12.8 oz (41.6 kg)   SpO2 94%   BMI 17.93 kg/m  Wt Readings from Last 3 Encounters:  02/24/21 91 lb 12.8 oz (41.6 kg)  02/10/21 103 lb 6.3 oz (46.9 kg)  01/15/21 83 lb (37.6 kg)     Health Maintenance Due  Topic Date Due  . MAMMOGRAM  11/07/2017    There are no preventive care reminders to display for this  patient.  Lab Results  Component Value Date   TSH 1.680 11/12/2020   Lab Results  Component Value Date   WBC 5.1 02/12/2021   HGB 11.5 (L) 02/12/2021   HCT 33.3 (L) 02/12/2021   MCV 101.5 (H) 02/12/2021   PLT 189 02/12/2021   Lab Results  Component Value Date   NA 131 (L) 02/12/2021   K 3.8 02/12/2021   CO2 20 (L) 02/12/2021   GLUCOSE 91 02/12/2021   BUN 11 02/12/2021   CREATININE 0.66 02/12/2021   BILITOT 1.0 02/12/2021   ALKPHOS 75 02/12/2021   AST 65 (H) 02/12/2021   ALT 40 02/12/2021   PROT 6.6 02/12/2021   ALBUMIN 4.0 02/12/2021   CALCIUM 9.5 02/12/2021   ANIONGAP 12 02/12/2021   GFR 155.91 02/28/2013   Lab Results  Component Value Date   CHOL 232 (H) 11/12/2020   Lab Results  Component Value Date   HDL 88 11/12/2020   Lab Results  Component Value Date   LDLCALC 131 (H) 11/12/2020   Lab Results  Component Value Date   TRIG 77 11/12/2020   Lab Results  Component Value Date   CHOLHDL 2.6 11/12/2020   Lab Results  Component Value Date   HGBA1C 4.9 11/12/2020      Assessment & Plan:   Problem List Items Addressed This Visit   None   Visit Diagnoses    New onset seizure (HCC)    -  Primary   Relevant Orders   Vitamin B12   Vitamin B1   CBC With Differential   Comprehensive metabolic panel  Magnesium   Lactate Dehydrogenase   Brain natriuretic peptide   Prolactin      No orders of the defined types were placed in this encounter.   Follow-up: No follow-ups on file.   PLAN  Labs collected. Will follow up with the patient as warranted.  Discussed with patient that she must stop consuming alcohol. This is likely the etiology for her seizure.   Start PT, OT, and SLP as intended. Follow with Neuro as scheduled.  Strict ER precuations  Patient encouraged to call clinic with any questions, comments, or concerns.  Janeece Ageeichard Berkeley Vanaken, NP

## 2021-02-25 ENCOUNTER — Other Ambulatory Visit: Payer: Self-pay | Admitting: Registered Nurse

## 2021-02-25 DIAGNOSIS — R7989 Other specified abnormal findings of blood chemistry: Secondary | ICD-10-CM

## 2021-02-25 NOTE — Progress Notes (Signed)
Placed order for repeat US of RUQ given technically challenging exam during admission. Pt was imaged while in postictal state, complicating exam. Concern for changes with dilated common bile duct. Pending results of this study will consider MRCP vs. Referral to GI for further assessment of liver function.  Jari Sportsman, NP

## 2021-02-26 ENCOUNTER — Telehealth: Payer: Self-pay | Admitting: Registered Nurse

## 2021-02-26 NOTE — Telephone Encounter (Signed)
Copied from CRM [id number]. Topic: Referral - Request for Referral >> Feb 26, 2021  9:38 AM Jaquita Rector A wrote: Has patient seen PCP for this complaint? Yes.   *If NO, is insurance requiring patient see PCP for this issue before PCP can refer them? Referral for which specialty: OT, PT and speech therapy  Preferred provider/office: none picked  Reason for referral: Per [facility]

## 2021-02-27 ENCOUNTER — Telehealth: Payer: Self-pay | Admitting: Registered Nurse

## 2021-02-27 NOTE — Telephone Encounter (Signed)
Pt's job is needing know when she can go back to work or if she is even able to work.   [facility] wants pt to have OT, SPEECH THERAPY and PT. They want her to have this before her New Pt  app on 04/17/21.

## 2021-02-27 NOTE — Telephone Encounter (Signed)
Referral was placed on 02/25/2021.

## 2021-02-28 NOTE — Telephone Encounter (Signed)
Called pt LM to call back to discuss details.

## 2021-02-28 NOTE — Telephone Encounter (Signed)
Please advise.   Pt work asking when/if she can return.  [facility] wants you to order OT, Speech therapy and PT before her appt. with them on 04/17/2021

## 2021-02-28 NOTE — Telephone Encounter (Signed)
She can return to extremely light duty on a limited schedule. I would prefer 3-4 days each week and around 4-6 hours each day as tolerated. No heavy lifting. No working alone. If she is amenable to these we can write a note. Otherwise I would want to defer to Neurology. New seizures aren't something to mess with, unfortunately.   Thanks,  Luan Pulling

## 2021-03-03 LAB — CBC WITH DIFFERENTIAL
Basophils Absolute: 0.2 10*3/uL (ref 0.0–0.2)
Basos: 4 %
EOS (ABSOLUTE): 0.2 10*3/uL (ref 0.0–0.4)
Eos: 4 %
Hematocrit: 34.1 % (ref 34.0–46.6)
Hemoglobin: 11.6 g/dL (ref 11.1–15.9)
Immature Grans (Abs): 0 10*3/uL (ref 0.0–0.1)
Immature Granulocytes: 0 %
Lymphocytes Absolute: 1.3 10*3/uL (ref 0.7–3.1)
Lymphs: 24 %
MCH: 33.4 pg — ABNORMAL HIGH (ref 26.6–33.0)
MCHC: 34 g/dL (ref 31.5–35.7)
MCV: 98 fL — ABNORMAL HIGH (ref 79–97)
Monocytes Absolute: 0.8 10*3/uL (ref 0.1–0.9)
Monocytes: 14 %
Neutrophils Absolute: 3 10*3/uL (ref 1.4–7.0)
Neutrophils: 54 %
RBC: 3.47 x10E6/uL — ABNORMAL LOW (ref 3.77–5.28)
RDW: 13 % (ref 11.7–15.4)
WBC: 5.5 10*3/uL (ref 3.4–10.8)

## 2021-03-03 LAB — COMPREHENSIVE METABOLIC PANEL
ALT: 52 IU/L — ABNORMAL HIGH (ref 0–32)
AST: 74 IU/L — ABNORMAL HIGH (ref 0–40)
Albumin/Globulin Ratio: 2.5 — ABNORMAL HIGH (ref 1.2–2.2)
Albumin: 4.9 g/dL (ref 3.8–4.9)
Alkaline Phosphatase: 84 IU/L (ref 44–121)
BUN/Creatinine Ratio: 13 (ref 9–23)
BUN: 7 mg/dL (ref 6–24)
Bilirubin Total: 0.3 mg/dL (ref 0.0–1.2)
CO2: 20 mmol/L (ref 20–29)
Calcium: 9.9 mg/dL (ref 8.7–10.2)
Chloride: 99 mmol/L (ref 96–106)
Creatinine, Ser: 0.54 mg/dL — ABNORMAL LOW (ref 0.57–1.00)
Globulin, Total: 2 g/dL (ref 1.5–4.5)
Glucose: 71 mg/dL (ref 65–99)
Potassium: 4 mmol/L (ref 3.5–5.2)
Sodium: 138 mmol/L (ref 134–144)
Total Protein: 6.9 g/dL (ref 6.0–8.5)
eGFR: 110 mL/min/{1.73_m2} (ref >59)

## 2021-03-03 LAB — VITAMIN B12: Vitamin B-12: 334 pg/mL (ref 232–1245)

## 2021-03-03 LAB — VITAMIN B1: Thiamine: 171.7 nmol/L (ref 66.5–200.0)

## 2021-03-03 LAB — LACTATE DEHYDROGENASE: LDH: 209 IU/L (ref 119–226)

## 2021-03-03 LAB — PROLACTIN: Prolactin: 11 ng/mL (ref 4.8–23.3)

## 2021-03-03 LAB — MAGNESIUM: Magnesium: 1.6 mg/dL (ref 1.6–2.3)

## 2021-03-03 LAB — BRAIN NATRIURETIC PEPTIDE: BNP: 43.7 pg/mL (ref 0.0–100.0)

## 2021-03-03 NOTE — Telephone Encounter (Signed)
Pt. Called in, spoke about work limitations. Pt. Asked for written documentation of restrictions.

## 2021-03-04 ENCOUNTER — Ambulatory Visit: Payer: No Typology Code available for payment source | Attending: Internal Medicine

## 2021-03-04 ENCOUNTER — Other Ambulatory Visit: Payer: Self-pay

## 2021-03-04 ENCOUNTER — Ambulatory Visit: Payer: No Typology Code available for payment source

## 2021-03-04 DIAGNOSIS — R41841 Cognitive communication deficit: Secondary | ICD-10-CM | POA: Diagnosis present

## 2021-03-04 DIAGNOSIS — M6281 Muscle weakness (generalized): Secondary | ICD-10-CM | POA: Diagnosis present

## 2021-03-04 DIAGNOSIS — R4701 Aphasia: Secondary | ICD-10-CM | POA: Insufficient documentation

## 2021-03-04 DIAGNOSIS — R2689 Other abnormalities of gait and mobility: Secondary | ICD-10-CM | POA: Insufficient documentation

## 2021-03-04 DIAGNOSIS — R2681 Unsteadiness on feet: Secondary | ICD-10-CM | POA: Insufficient documentation

## 2021-03-04 NOTE — Patient Instructions (Signed)
   Talk Path Therapy app  -free (word finding - "aphasia")  Constant Therapy app  -free for 14 days, and $ after that (use "[email or web address]" for 15% off monthly subscriptions)  To compensate for word finding: Describe the thing Say a completely different sentence NOT using the word you can't think of Think of a synonym

## 2021-03-04 NOTE — Telephone Encounter (Signed)
Kelly Zuniga, she was on her way to a PT and speech therapy appt today I asked her to call me after so we can discuss her letter. Pt states she will call me back after those are done.

## 2021-03-05 NOTE — Therapy (Signed)
[facility] [address] [place], [place], [address] Phone: [phone]   Fax:  [phone]  Speech Language Pathology Evaluation  Patient Details  Name: Kelly Zuniga MRN: [id number] Date of Birth: [birth year] Referring Provider (SLP): [name], DO   Encounter Date: 03/04/2021   End of Session - 03/05/21 0827    Visit Number 1    Number of Visits 17    Date for SLP Re-Evaluation 06/02/21    SLP Start Time 1403    SLP Stop Time  1445    SLP Time Calculation (min) 42 min    Activity Tolerance Patient tolerated treatment well           Past Medical History:  Diagnosis Date  . Alcohol abuse   . Allergy   . Anxiety   . Asthma   . Asthma    Phreesia 11/09/2020  . Depression    posibly bipolar disorder. Sees Dr [name] for psychiatric care and Dr [name] for therapy  . Depression    Phreesia 11/09/2020  . Headache(784.0)    last migraine 2004  . 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 Hypertension    never on meds   . Seizure (HCC) 02/09/2021  . Substance abuse (HCC)    Phreesia 11/09/2020    Past Surgical History:  Procedure Laterality Date  . benign cyst     removed from left cheek  . TONSILECTOMY, ADENOIDECTOMY, BILATERAL MYRINGOTOMY AND TUBES      There were no vitals filed for this visit.   Subjective Assessment - 03/04/21 1414    Subjective "My balance and walking, I have trouble finding words, and my short term memory."    Currently in Pain? No/denies              SLP Evaluation [facility] - 03/05/21 0001      SLP Visit Information   SLP Received On 03/04/21    Referring Provider (SLP) [name], DO    Onset Date 02-09-21    Medical Diagnosis seizure      Subjective   Patient/Family Stated Goal Improve word finding      General Information   HPI 54yo female c/o dizziness and hand cramping and weakness, had recent fall in which she struck her posterior occiput. Brought  to the ED from work by EMS and admitted with new onset seizures. PMH depression, HTN, substance abuse.      Prior Functional Status   Cognitive/Linguistic Baseline Within functional limits    Type of Home House     Lives With Significant other    Available Support Family    Vocation Full time employment      Cognition   Overall Cognitive Status Impaired/Different from baseline    Area of Impairment Memory   pt reported memory issues; pt will likely need cognitive assessment, and goals TBD   Behaviors Poor frustration tolerance;Impulsive   with anomia; pt scored 23 on SLUMS for the SLE on 02-12-21 (day of d/c)     Auditory Comprehension   Overall Auditory Comprehension Appears within functional limits for tasks assessed      Verbal Expression   Overall Verbal Expression Impaired    Naming Impairment    Confrontation 75-100% accurate    Convergent --   76% (23/30 on BNT)   Other Naming Comments pt demonstrated behaviors of anxiety and frustration after the first and subsequent instances of anomia on the [facility] Naming Test - leg bouncing, hands wringing, head  on the table, banging her foot on the floor, and shouting the name of the item when recalled. SLP told pt that frustration and anxiety will make her anomia worse, comparatively if she did not have those feelings. Suggested she talk with her MD about this.    Other Verbal Expression Comments see "other naming comments" Pt states she has anomia "every day", however this was not noted in conversation today with SLP during evaluation until [employer].      Motor Speech   Overall Motor Speech Appears within functional limits for tasks assessed      Standardized Assessments   Standardized Assessments  [place] Naming Test-2nd edition    [place] Naming Test-2nd edition  23/30 on even numbered items which is more than 2 standard deviations below the mean on two measurements of this version of the test                            SLP Education - 03/05/21 0821    Education Details anomia compensations; constant therapy app, talk path therapy app, talk to MD about anxiety/frustration    Person(s) Educated Patient    Methods Explanation;Demonstration    Comprehension Verbalized understanding;Returned demonstration            SLP Short Term Goals - 03/05/21 0842      SLP SHORT TERM GOAL #1   Title pt will report successful usage of a med tracking system between 3 sessions    Time 4    Period Weeks   or 9 sessions, for all STGs   Status New      SLP SHORT TERM GOAL #2   Title pt will demonstrate WNL verbal expression in 15 minutes mod complex conversation in 4 sessions using compensations PRN    Time 4    Period Weeks    Status New      SLP SHORT TERM GOAL #3   Title pt will complete cognitive assessment in the first two therapy sessions    Time 2    Period --   sessions   Status New            SLP Long Term Goals - 03/05/21 1610      SLP LONG TERM GOAL #1   Title pt will demonstrate WNL verbal expression in 15 minutes mod complex-complex conversation in 4 sessions using compensations PRN    Time 8    Period Weeks   or 17 total sesisons, for all LTGs   Status New      SLP LONG TERM GOAL #2   Title pt will indicate a frustration level regarding aonmia which does not debilitate further conversation    Time 8    Period Weeks    Status New            Plan - 03/05/21 0828    Clinical Impression Statement Kelly Zuniga presents today with reported short term memory deficts, evidence of impulsivity and reduced frustration tolerance with anomia on [employer], and likely cognitive communciation defiicts that will need to be assessed on first therapy visit. She reports increasing frustration in conversation when she has an anomic event. Frustration and anxious behaviors were seen during the [place] today, and in this clinician's opinion, negatively impacted pt's  ability for fluid verbal expression after that time. Currently she is managing her appointments using MyChart (different from prior to seizures) and tries to remember if she took  her meds. To her knowledge she has not skipped or overmedicated to date, but stated, "I'm always wondering if I took my meds or not!" Her score of 23/30 on the [employer] -2 (evens version) (BNT-2) places her naming skills at more than 2 standard deviations below the mean. Interestingly, pt showed little anomia during conversation during the evaluation session. She was observed to use description strategy during the BNT-2, and when SLP educated pt about anomia strategies she shouted, "I do that!" when synonym strategy was described to her. SLP believes pt has some degree of cognitive deficit and will be assessed for this during first therapy session. SLP believes that pt will benefit from skilled ST targeting cognition and verbal expression.    Speech Therapy Frequency 2x / week    Duration 8 weeks   or 17 sessions   Treatment/Interventions Environmental controls;Functional tasks;Compensatory techniques;SLP instruction and feedback;Multimodal communcation approach;Language facilitation;Cueing hierarchy;Cognitive reorganization;Patient/family education;Internal/external aids    Potential to Achieve Goals Good    SLP Home Exercise Plan provided - apps    Consulted and Agree with Plan of Care Patient           Patient will benefit from skilled therapeutic intervention in order to improve the following deficits and impairments:   Aphasia - Plan: SLP plan of care cert/re-cert  Cognitive communication deficit - Plan: SLP plan of care cert/re-cert    Problem List Patient Active Problem List   Diagnosis Date Noted  . Hypomagnesemia 02/10/2021  . Generalized anxiety disorder 02/10/2021  . Insomnia 02/10/2021  . Fall due to seizure (HCC) 02/10/2021  . Head injury 02/10/2021  . Macrocytic anemia 02/10/2021  .  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 Seizure (HCC) 02/09/2021  . Elevated LFTs 02/09/2021  . Substance abuse (HCC) 02/18/2013  . Alcohol dependence (HCC) 02/18/2013  . Headache disorder 02/26/2012  . 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 MDD (major depressive disorder) 12/26/2010  . HTN (hypertension) 12/26/2010  . ALLERGIC RHINITIS 12/26/2010  . Asthma 12/26/2010    [facility] 03/05/2021, 8:50 AM  [facility] [address] [place], [place], [address] Phone: [phone]   Fax:  [phone]  Name: Kelly Zuniga MRN: [id number] Date of Birth: 19-Dec-1966

## 2021-03-05 NOTE — Therapy (Signed)
[facility] [address] [place], [place], [address] Phone: [phone]   Fax:  [phone]  Physical Therapy Evaluation  Patient Details  Name: Kelly Zuniga MRN: [id number] Date of Birth: Oct 27, 1966 Referring Provider (PT): Referred by Kelly Zuniga (hospitalist) but PCP is Kelly Zuniga.   Encounter Date: 03/04/2021   PT End of Session - 03/04/21 1319    Visit Number 1    Number of Visits 17    PT Start Time 1315    PT Stop Time 1402    PT Time Calculation (min) 47 min    Equipment Utilized During Treatment Gait belt    Activity Tolerance Patient tolerated treatment well    Behavior During Therapy Anxious           Past Medical History:  Diagnosis Date  . Alcohol abuse   . Allergy   . Anxiety   . Asthma   . Asthma    Phreesia 11/09/2020  . Depression    posibly bipolar disorder. Sees Dr Kelly Zuniga for psychiatric care and Dr Kelly Zuniga for therapy  . Depression    Phreesia 11/09/2020  . Headache(784.0)    last migraine 2004  . 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 Hypertension    never on meds   . Seizure (HCC) 02/09/2021  . Substance abuse (HCC)    Phreesia 11/09/2020    Past Surgical History:  Procedure Laterality Date  . benign cyst     removed from left cheek  . TONSILECTOMY, ADENOIDECTOMY, BILATERAL MYRINGOTOMY AND TUBES      There were no vitals filed for this visit.    Subjective Assessment - 03/04/21 1321    Subjective Pt was recently hospitalized due to seizure. She was started on keppra. Pt reports they are still doing testing to try to further determine cause. Pt reports that since seizure activity she is having memory issues (more short term), decreased balance, poor depth perception, weakness and fatigue, as well as speech issues. Finds that she has trouble getting her words out. Pt is using walker to walk with more when she goes out longer distances. Pt reports 1 seizure late  last week since coming home. She has talked to her PCP about that. Reports she had ringing in her right ear, a bad headache and when woke up she could not even sit up with severe nausea.    Pertinent History 55 y.o. female with medical history significant of alcohol abuse, astma,anxiety/depression, HTN, holmes-adie syndrome reprts 3-4 days of room spinning dizziness. At work today she had continued dizziness, hand cramping and weakness. She had a brief episode of LOC falling and striking posterior occiput. Coworkers witness some whole body shaking lasting several seconds. EMS activated and patient transported to [facility] for evaluation.   CT head w/o acute findings, MRI brain w/o acute findings.    Patient Stated Goals Pt wants to improve her balance and be able to bend over and sit to without feeling like she will fall. Also wants to build up her strength.    Currently in Pain? No/denies              [facility] PT Assessment - 03/04/21 1329      Assessment   Medical Diagnosis seizure    Referring Provider (PT) Referred by Kelly Zuniga (hospitalist) but PCP is Kelly Zuniga.    Onset Date/Surgical Date 02/09/21    Hand Dominance Right      Precautions   Precautions Fall  Balance Screen   Has the patient fallen in the past 6 months Yes    How many times? 1    Has the patient had a decrease in activity level because of a fear of falling?  Yes    Is the patient reluctant to leave their home because of a fear of falling?  Yes      Home Environment   Living Environment Private residence    Living Arrangements Spouse/significant other    Available Help at Discharge Family   husband works during the day   Type of Home House    Home Access Stairs to enter    Entrance Stairs-Number of Steps 3    Entrance Stairs-Rails None    Home Layout Two level;Bed/bath upstairs    Alternate Level Stairs-Number of Steps 12    Alternate Level Stairs-Rails Left    Home Equipment Walker - 2 wheels     Additional Comments going to be adding railing to stairs soon      Prior Function   Level of Independence Independent    Vocation Part time employment    Vocation Requirements worked in [employer]    Leisure play golf, walk dog, Psychiatristart      Cognition   Overall Cognitive Status Impaired/Different from baseline    Area of Impairment Memory    Memory Decreased short-term memory      Observation/Other Assessments   Observations noted slight tremor in hands. Perihperal vision intact as well as smooth pursuit and saccades.      Sensation   Light Touch Appears Intact      Coordination   Gross Motor Movements are Fluid and Coordinated Yes   intact RAMs   Fine Motor Movements are Fluid and Coordinated No   slowed finger opposition bilateral   Finger Nose Finger Test intact    Heel Shin Test intact      ROM / Strength   AROM / PROM / Strength Strength      Strength   Overall Strength Comments strength grossly 4+/5 throughout except left shoulder 4/5.      Transfers   Transfers Sit to Stand;Stand to Sit    Sit to Stand 5: Supervision    Five time sit to stand comments  51.87 sec without hands    Stand to Sit 5: Supervision      Ambulation/Gait   Ambulation/Gait Yes    Ambulation/Gait Assistance 5: Supervision    Ambulation Distance (Feet) 100 Feet    Assistive device Rolling walker    Gait Pattern Step-through pattern;Decreased step length - right;Decreased step length - left    Ambulation Surface Level;Indoor    Gait velocity 14.76 sec=0.735m/s    Stairs Yes    Stairs Assistance 4: Min guard    Stair Management Technique One rail Left;Step to pattern    Number of Stairs 4    Height of Stairs 6      Standardized Balance Assessment   Standardized Balance Assessment Berg Balance Test      Berg Balance Test   Sit to Stand Able to stand without using hands and stabilize independently    Standing Unsupported Able to stand safely 2 minutes    Sitting with Back Unsupported but Feet  Supported on Floor or Stool Able to sit safely and securely 2 minutes    Stand to Sit Sits safely with minimal use of hands    Transfers Able to transfer safely, minor use of hands    Standing  Unsupported with Eyes Closed Able to stand 10 seconds with supervision    Standing Unsupported with Feet Together Able to place feet together independently and stand for 1 minute with supervision    From Standing, Reach Forward with Outstretched Arm Can reach forward >12 cm safely (5")    From Standing Position, Pick up Object from Floor Unable to pick up and needs supervision    From Standing Position, Turn to Look Behind Over each Shoulder Turn sideways only but maintains balance    Turn 360 Degrees Able to turn 360 degrees safely but slowly    Standing Unsupported, Alternately Place Feet on Step/Stool Able to stand independently and safely and complete 8 steps in 20 seconds    Standing Unsupported, One Foot in Front Able to plae foot ahead of the other independently and hold 30 seconds    Standing on One Leg Tries to lift leg/unable to hold 3 seconds but remains standing independently    Total Score 42                      Objective measurements completed on examination: See above findings.               PT Education - 03/05/21 0839    Education Details Discussed PT plan of care and results of testing.    Person(s) Educated Patient    Methods Explanation    Comprehension Verbalized understanding            PT Short Term Goals - 03/05/21 0948      PT SHORT TERM GOAL #1   Title Pt will be independent with initial HEP for strength and balance.    Time 4    Period Weeks    Status New    Target Date 04/04/21      PT SHORT TERM GOAL #2   Title Pt will decrease  5 x sit to stand from 51.87 sec to <40 sec for improved balance and functional strength.    Baseline 03/04/21 51.87 sec from chair without hands    Time 4    Period Weeks    Status New    Target Date  04/04/21      PT SHORT TERM GOAL #3   Title Pt will increase Berg from 42 to >45/56 for improved balance and decreased fall risk.    Baseline 03/04/21 45/56    Time 4    Period Weeks    Status New    Target Date 04/04/21      PT SHORT TERM GOAL #4   Title Pt will ambulate >300' on level surfaces with LRAD mod I for improved mobility in home and short community distances.    Time 4    Period Weeks    Status New    Target Date 04/04/21             PT Long Term Goals - 03/05/21 0951      PT LONG TERM GOAL #1   Title Pt will be independent with progressive HEP for strength and balance to continue gains on own.    Time 8    Period Weeks    Status New    Target Date 05/04/21      PT LONG TERM GOAL #2   Title Pt will decrease 5 x sit to stand  to <30 sec for improved balance and functional strength.    Baseline 03/04/21 51.87 sec from chair with hands  Time 8    Period Weeks    Status New    Target Date 05/04/21      PT LONG TERM GOAL #3   Title Pt will increase gait speed from 0.40m/s to >0.40m/s for improved gait safety in the community.    Baseline 03/04/21 0.75m/s with walker    Time 8    Period Weeks    Status New    Target Date 05/04/21      PT LONG TERM GOAL #4   Title Pt will increase Berg to >50/56 for improved balance and decreased fall risk.    Baseline 03/04/21 42/56    Time 8    Period Weeks    Status New    Target Date 05/04/21      PT LONG TERM GOAL #5   Title Pt will ambulate up/down step in reciprocal pattern with 1 rail mod I for improved access to home.    Time 8    Period Weeks    Status New    Target Date 05/04/21                  Plan - 03/04/21 1905    Clinical Impression Statement Pt is 55 y/o female who presents after hospitalization from seizure. Pt has history of alcohol abuse and anxiety/depression. Was anxious during evaluation. Pt presents with slight decreased strength grossly 4+/5 throughout. Pt has decreased balance with  5 x sit to stand of 51.87 sec and Berg Balance score of 42/56 indicating high fall risk. Pt is currently ambulating with FW with gait sped of 0.5m/s which indicates decreased safety with community ambulation. Pt is also reporting some difficulty with depth perception especially with stair negotiation. Pt will benefit from skilled PT to address strength, balance and functional mobility defictis.    Personal Factors and Comorbidities Comorbidity 3+    Comorbidities alcohol abuse, astma,anxiety/depression, HTN, holmes-adie syndrome    Examination-Activity Limitations Locomotion Level;Transfers;Stairs;Stand    Examination-Participation Restrictions Community Activity;Occupation;Cleaning    Stability/Clinical Decision Making Evolving/Moderate complexity    Clinical Decision Making Moderate    Rehab Potential Good    PT Frequency 2x / week   plus eval   PT Duration 8 weeks    PT Treatment/Interventions ADLs/Self Care Home Management;DME Instruction;Gait training;Stair training;Functional mobility training;Therapeutic activities;Therapeutic exercise;Balance training;Neuromuscular re-education;Patient/family education;Vestibular;Visual/perceptual remediation/compensation    PT Next Visit Plan Begin initial functional strengthening and balance HEP. Pt has high anxiety. Gait training with walker but can we try cane to see how she does? She is furniture walking in home.    Consulted and Agree with Plan of Care Patient           Patient will benefit from skilled therapeutic intervention in order to improve the following deficits and impairments:  Abnormal gait,Decreased balance,Decreased cognition,Decreased mobility,Decreased strength,Decreased coordination  Visit Diagnosis: Other abnormalities of gait and mobility  Muscle weakness (generalized)  Unsteadiness on feet     Problem List Patient Active Problem List   Diagnosis Date Noted  . Hypomagnesemia 02/10/2021  . Generalized anxiety disorder  02/10/2021  . Insomnia 02/10/2021  . Fall due to seizure (HCC) 02/10/2021  . Head injury 02/10/2021  . Macrocytic anemia 02/10/2021  . 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 Seizure (HCC) 02/09/2021  . Elevated LFTs 02/09/2021  . Substance abuse (HCC) 02/18/2013  . Alcohol dependence (HCC) 02/18/2013  . Headache disorder 02/26/2012  . 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 MDD (major depressive disorder) 12/26/2010  .  HTN (hypertension) 12/26/2010  . ALLERGIC RHINITIS 12/26/2010  . Asthma 12/26/2010    Ronn Melena, PT, DPT, NCS 03/05/2021, 9:54 AM  [facility] [address] [place], [place], [address] Phone: [phone]   Fax:  [phone]  Name: Kelly Zuniga MRN: [id number] Date of Birth: March 26, 1966

## 2021-03-06 ENCOUNTER — Ambulatory Visit: Payer: No Typology Code available for payment source

## 2021-03-06 ENCOUNTER — Other Ambulatory Visit: Payer: Self-pay

## 2021-03-06 DIAGNOSIS — R2689 Other abnormalities of gait and mobility: Secondary | ICD-10-CM

## 2021-03-06 DIAGNOSIS — R4701 Aphasia: Secondary | ICD-10-CM

## 2021-03-06 DIAGNOSIS — R41841 Cognitive communication deficit: Secondary | ICD-10-CM

## 2021-03-06 DIAGNOSIS — R2681 Unsteadiness on feet: Secondary | ICD-10-CM

## 2021-03-06 DIAGNOSIS — M6281 Muscle weakness (generalized): Secondary | ICD-10-CM

## 2021-03-06 NOTE — Therapy (Signed)
[facility] [address] [place], [place], [address] Phone: [phone]   Fax:  [phone]  Speech Language Pathology Treatment  Patient Details  Name: Kelly Zuniga MRN: [id number] Date of Birth: 04-Feb-1966 Referring Provider (SLP): [name], DO   Encounter Date: 03/06/2021   End of Session - 03/06/21 1729    Visit Number 2    Number of Visits 17    Date for SLP Re-Evaluation 06/02/21    SLP Start Time 1703    SLP Stop Time  1745    SLP Time Calculation (min) 42 min    Activity Tolerance Patient tolerated treatment well           Past Medical History:  Diagnosis Date  . Alcohol abuse   . Allergy   . Anxiety   . Asthma   . Asthma    Phreesia 11/09/2020  . Depression    posibly bipolar disorder. Sees Dr [name] for psychiatric care and Dr [name] for therapy  . Depression    Phreesia 11/09/2020  . Headache(784.0)    last migraine 2004  . 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 Hypertension    never on meds   . Seizure (HCC) 02/09/2021  . Substance abuse (HCC)    Phreesia 11/09/2020    Past Surgical History:  Procedure Laterality Date  . benign cyst     removed from left cheek  . TONSILECTOMY, ADENOIDECTOMY, BILATERAL MYRINGOTOMY AND TUBES      There were no vitals filed for this visit.          ADULT SLP TREATMENT - 03/06/21 1729      General Information   Behavior/Cognition Alert;Cooperative;Pleasant mood      Treatment Provided   Treatment provided Cognitive-Linquistic      Cognitive-Linquistic Treatment   Treatment focused on Cognition;Aphasia;Patient/family/caregiver education    Skilled Treatment Pt reports she completed HEP; however, increased frustration endorsed if she was unable to ID word or generate words in harder category. Pt became tearful while discussing word finding deficits. CLQT completed this session to further assess cognition, with all subtests WNL;  however, pt noted with increased difficulty with clock drawing (pt guessed which hand was longer), story retell, and generative naming. Pt able to manage frustration level with no outburst noted during evaluation. SLP provided additional items for HEP.      Assessment / Recommendations / Plan   Plan Continue with current plan of care      Progression Toward Goals   Progression toward goals Progressing toward goals              SLP Short Term Goals - 03/06/21 1658      SLP SHORT TERM GOAL #1   Title pt will report successful usage of a med tracking system between 3 sessions    Time 4    Period Weeks   or 9 sessions, for all STGs   Status On-going      SLP SHORT TERM GOAL #2   Title pt will demonstrate WNL verbal expression in 15 minutes mod complex conversation in 4 sessions using compensations PRN    Time 4    Period Weeks    Status On-going      SLP SHORT TERM GOAL #3   Title pt will complete cognitive assessment in the first two therapy sessions    Period --   sessions   Status Achieved  SLP Long Term Goals - 03/06/21 1658      SLP LONG TERM GOAL #1   Title pt will demonstrate WNL verbal expression in 15 minutes mod complex-complex conversation in 4 sessions using compensations PRN    Time 8    Period Weeks   or 17 total sesisons, for all LTGs   Status On-going      SLP LONG TERM GOAL #2   Title pt will indicate a frustration level regarding aonmia which does not debilitate further conversation    Time 8    Period Weeks    Status On-going            Plan - 03/06/21 1803    Clinical Impression Statement Kelly Zuniga presents today with reported short term memory deficts, evidence of impulsivity and reduced frustration tolerance with anomia. CLQT completed this session, with all subtests WNL; however, pt demonstrated extended processing time for clock drawing, decreased recall for story retall, and difficulty with generative naming. Pt endorsed she is  managing current frustration related to cognitive linguistic skills by removing self from situation. SLP believes pt has some degree of cognitive deficit, although not highlighted on CLQT. SLP believes that pt will benefit from skilled ST targeting cognition and verbal expression.    Speech Therapy Frequency 2x / week    Duration 8 weeks    Treatment/Interventions Environmental controls;Functional tasks;Compensatory techniques;SLP instruction and feedback;Multimodal communcation approach;Language facilitation;Cueing hierarchy;Cognitive reorganization;Patient/family education;Internal/external aids    Potential to Achieve Goals Fair    SLP Home Exercise Plan provided    Consulted and Agree with Plan of Care Patient           Patient will benefit from skilled therapeutic intervention in order to improve the following deficits and impairments:   Aphasia  Cognitive communication deficit    Problem List Patient Active Problem List   Diagnosis Date Noted  . Hypomagnesemia 02/10/2021  . Generalized anxiety disorder 02/10/2021  . Insomnia 02/10/2021  . Fall due to seizure (HCC) 02/10/2021  . Head injury 02/10/2021  . Macrocytic anemia 02/10/2021  . 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 Seizure (HCC) 02/09/2021  . Elevated LFTs 02/09/2021  . Substance abuse (HCC) 02/18/2013  . Alcohol dependence (HCC) 02/18/2013  . Headache disorder 02/26/2012  . 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 MDD (major depressive disorder) 12/26/2010  . HTN (hypertension) 12/26/2010  . ALLERGIC RHINITIS 12/26/2010  . Asthma 12/26/2010    Janann Colonel, MA CCC-SLP 03/06/2021, 6:09 PM  [facility] [address] [place], [place], [address] Phone: [phone]   Fax:  [phone]   Name: Kelly Zuniga MRN: [id number] Date of Birth: November 03, 1966

## 2021-03-06 NOTE — Telephone Encounter (Signed)
Patient checking on the status of letter and would like to pick up today. Please call [phone]

## 2021-03-06 NOTE — Patient Instructions (Signed)
Access Code: [email or web address] URL: [email or web address] Date: 03/06/2021 Prepared by: Elmer Bales  Exercises Sit to Stand - 2 x daily - 7 x weekly - 2 sets - 5 reps Side Stepping with Counter Support - 2 x daily - 7 x weekly - 1 sets - 3 reps Walking March - 2 x daily - 7 x weekly - 1 sets - 3 reps Backward Walking with Counter Support - 2 x daily - 7 x weekly - 1 sets - 3 reps

## 2021-03-06 NOTE — Therapy (Signed)
[facility] [address] [place], [place], [address] Phone: [phone]   Fax:  [phone]  Physical Therapy Treatment  Patient Details  Name: Kelly Zuniga MRN: [id number] Date of Birth: 12/08/1966 Referring Provider (PT): Referred by [name] (hospitalist) but PCP is [name].   Encounter Date: 03/06/2021   PT End of Session - 03/06/21 1624    Visit Number 2    Number of Visits 17    PT Start Time 1622   pt arrived a little late   PT Stop Time 1700    PT Time Calculation (min) 38 min    Equipment Utilized During Treatment Gait belt    Activity Tolerance Patient tolerated treatment well    Behavior During Therapy Anxious           Past Medical History:  Diagnosis Date  . Alcohol abuse   . Allergy   . Anxiety   . Asthma   . Asthma    Phreesia 11/09/2020  . Depression    posibly bipolar disorder. Sees Dr [name] for psychiatric care and Dr [name] for therapy  . Depression    Phreesia 11/09/2020  . Headache(784.0)    last migraine 2004  . 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 Hypertension    never on meds   . Seizure (HCC) 02/09/2021  . Substance abuse (HCC)    Phreesia 11/09/2020    Past Surgical History:  Procedure Laterality Date  . benign cyst     removed from left cheek  . TONSILECTOMY, ADENOIDECTOMY, BILATERAL MYRINGOTOMY AND TUBES      There were no vitals filed for this visit.   Subjective Assessment - 03/06/21 1624    Subjective Pt reports doing pretty well just a little tired.    Pertinent History 55 y.o. female with medical history significant of alcohol abuse, astma,anxiety/depression, HTN, holmes-adie syndrome reprts 3-4 days of room spinning dizziness. At work today she had continued dizziness, hand cramping and weakness. She had a brief episode of LOC falling and striking posterior occiput. Coworkers witness some whole body shaking lasting several seconds. EMS  activated and patient transported to [facility] for evaluation.   CT head w/o acute findings, MRI brain w/o acute findings.    Patient Stated Goals Pt wants to improve her balance and be able to bend over and sit to without feeling like she will fall. Also wants to build up her strength.    Currently in Pain? No/denies                             [facility] Adult PT Treatment/Exercise - 03/06/21 1625      Ambulation/Gait   Ambulation/Gait Yes    Ambulation/Gait Assistance 5: Supervision;4: Min guard    Ambulation/Gait Assistance Details Verbal cues for sequencing with cane. Pt instructed to relax left arm to allow more arm swing.    Ambulation Distance (Feet) 345 Feet    Assistive device Straight cane    Gait Pattern Step-through pattern;Decreased arm swing - left    Ambulation Surface Level;Indoor      Neuro Re-ed    Neuro Re-ed Details  Sit to stand x 10 without hands with focus on trying to keep core tight as was initially leaning posterior some. Improved as went on with much more upright posture. Then performed with airex under feet x 10. Standing on airex feet apart x 30 sec then x 30 sec with  eyes closed with increased sway. Alternating toe taps on cone airex with CGA. Pt more challenged in right SLS. On airex feet together x 30 sec then head turns left/right x 10. Along counter: side stepping without UE support 8' x 6 with verbal cues to keep feet straight, marching with 1 UE support forwards then backwards gait 8' x 3 each CGA/ close SBA.                  PT Education - 03/06/21 1836    Education Details Initial HEP. Advised against furniture walking at home instead using walker at this time.    Person(s) Educated Patient    Methods Explanation;Demonstration;Handout    Comprehension Verbalized understanding;Returned demonstration            PT Short Term Goals - 03/05/21 0948      PT SHORT TERM GOAL #1   Title Pt will be independent with initial HEP for  strength and balance.    Time 4    Period Weeks    Status New    Target Date 04/04/21      PT SHORT TERM GOAL #2   Title Pt will decrease  5 x sit to stand from 51.87 sec to <40 sec for improved balance and functional strength.    Baseline 03/04/21 51.87 sec from chair without hands    Time 4    Period Weeks    Status New    Target Date 04/04/21      PT SHORT TERM GOAL #3   Title Pt will increase Berg from 42 to >45/56 for improved balance and decreased fall risk.    Baseline 03/04/21 45/56    Time 4    Period Weeks    Status New    Target Date 04/04/21      PT SHORT TERM GOAL #4   Title Pt will ambulate >300' on level surfaces with LRAD mod I for improved mobility in home and short community distances.    Time 4    Period Weeks    Status New    Target Date 04/04/21             PT Long Term Goals - 03/05/21 0951      PT LONG TERM GOAL #1   Title Pt will be independent with progressive HEP for strength and balance to continue gains on own.    Time 8    Period Weeks    Status New    Target Date 05/04/21      PT LONG TERM GOAL #2   Title Pt will decrease 5 x sit to stand  to <30 sec for improved balance and functional strength.    Baseline 03/04/21 51.87 sec from chair with hands    Time 8    Period Weeks    Status New    Target Date 05/04/21      PT LONG TERM GOAL #3   Title Pt will increase gait speed from 0.1m/s to >0.15m/s for improved gait safety in the community.    Baseline 03/04/21 0.62m/s with walker    Time 8    Period Weeks    Status New    Target Date 05/04/21      PT LONG TERM GOAL #4   Title Pt will increase Berg to >50/56 for improved balance and decreased fall risk.    Baseline 03/04/21 42/56    Time 8    Period Weeks    Status New  Target Date 05/04/21      PT LONG TERM GOAL #5   Title Pt will ambulate up/down step in reciprocal pattern with 1 rail mod I for improved access to home.    Time 8    Period Weeks    Status New    Target  Date 05/04/21                 Plan - 03/06/21 1837    Clinical Impression Statement Pt improved with practice on balance activities. Internally feels less steady per her report than she is presenting with anxiety. Focusing on engaging core helped some. Able to initiate gait training with SPC and pt picked up sequencing well. Will need to continue to practice before switches off walker.    Personal Factors and Comorbidities Comorbidity 3+    Comorbidities alcohol abuse, astma,anxiety/depression, HTN, holmes-adie syndrome    Examination-Activity Limitations Locomotion Level;Transfers;Stairs;Stand    Examination-Participation Restrictions Community Activity;Occupation;Cleaning    Stability/Clinical Decision Making Evolving/Moderate complexity    Rehab Potential Good    PT Frequency 2x / week   plus eval   PT Duration 8 weeks    PT Treatment/Interventions ADLs/Self Care Home Management;DME Instruction;Gait training;Stair training;Functional mobility training;Therapeutic activities;Therapeutic exercise;Balance training;Neuromuscular re-education;Patient/family education;Vestibular;Visual/perceptual remediation/compensation    PT Next Visit Plan How is HEP going? Add to is as able. Pt has high anxiety. Gait training with SPC. Continue balance activities.    Consulted and Agree with Plan of Care Patient           Patient will benefit from skilled therapeutic intervention in order to improve the following deficits and impairments:  Abnormal gait,Decreased balance,Decreased cognition,Decreased mobility,Decreased strength,Decreased coordination  Visit Diagnosis: Other abnormalities of gait and mobility  Muscle weakness (generalized)  Unsteadiness on feet     Problem List Patient Active Problem List   Diagnosis Date Noted  . Hypomagnesemia 02/10/2021  . Generalized anxiety disorder 02/10/2021  . Insomnia 02/10/2021  . Fall due to seizure (HCC) 02/10/2021  . Head injury 02/10/2021   . Macrocytic anemia 02/10/2021  . 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 Seizure (HCC) 02/09/2021  . Elevated LFTs 02/09/2021  . Substance abuse (HCC) 02/18/2013  . Alcohol dependence (HCC) 02/18/2013  . Headache disorder 02/26/2012  . 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 MDD (major depressive disorder) 12/26/2010  . HTN (hypertension) 12/26/2010  . ALLERGIC RHINITIS 12/26/2010  . Asthma 12/26/2010    Ronn Melena, PT, DPT, NCS 03/06/2021, 6:40 PM  [facility] [address] [place], [place], [address] Phone: [phone]   Fax:  [phone]  Name: Kelly Zuniga MRN: [id number] Date of Birth: Oct 23, 1966

## 2021-03-10 NOTE — Telephone Encounter (Signed)
Called back left a message letting her know I still need to discuss the details as she never did call back after PT last week. Still need to know if she wants to return or is unable. Note can be completed once pt calls back

## 2021-03-10 NOTE — Telephone Encounter (Signed)
Called pt back.

## 2021-03-10 NOTE — Telephone Encounter (Signed)
Patient has returned call regarding documentation for their return to work Please contact to discuss further when possible

## 2021-03-11 NOTE — Telephone Encounter (Signed)
Pt agreeable to light duty would like to do 3 days a week at 4 hours each for now and then can reasses as she continues. Pt reports just needs letter to site specific limitations

## 2021-03-11 NOTE — Telephone Encounter (Signed)
Pended letter draft in chart. Please review and change if needed then can go to MyChart for pt convenience

## 2021-03-12 ENCOUNTER — Ambulatory Visit: Payer: No Typology Code available for payment source

## 2021-03-12 NOTE — Telephone Encounter (Signed)
Ok to send  Thanks  [employer]

## 2021-03-14 ENCOUNTER — Ambulatory Visit: Payer: No Typology Code available for payment source

## 2021-03-14 ENCOUNTER — Other Ambulatory Visit: Payer: Self-pay

## 2021-03-14 DIAGNOSIS — R2689 Other abnormalities of gait and mobility: Secondary | ICD-10-CM

## 2021-03-14 DIAGNOSIS — R2681 Unsteadiness on feet: Secondary | ICD-10-CM

## 2021-03-14 NOTE — Therapy (Signed)
[facility] [address] [place], [place], [address] Phone: [phone]   Fax:  [phone]  Physical Therapy Treatment  Patient Details  Name: Kelly Zuniga MRN: [id number] Date of Birth: [birth year] Referring Provider (PT): Referred by Kelly Zuniga (hospitalist) but PCP is Kelly Zuniga.   Encounter Date: 03/14/2021   PT End of Session - 03/14/21 0852    Visit Number 3    Number of Visits 17    PT Start Time 0849    PT Stop Time 0928    PT Time Calculation (min) 39 min    Equipment Utilized During Treatment Gait belt    Activity Tolerance Patient tolerated treatment well    Behavior During Therapy Anxious           Past Medical History:  Diagnosis Date  . Alcohol abuse   . Allergy   . Anxiety   . Asthma   . Asthma    Phreesia 11/09/2020  . Depression    posibly bipolar disorder. Sees Kelly Zuniga for psychiatric care and Kelly Zuniga for therapy  . Depression    Phreesia 11/09/2020  . Headache(784.0)    last migraine 2004  . 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 Hypertension    never on meds   . Seizure (HCC) 02/09/2021  . Substance abuse (HCC)    Phreesia 11/09/2020    Past Surgical History:  Procedure Laterality Date  . benign cyst     removed from left cheek  . TONSILECTOMY, ADENOIDECTOMY, BILATERAL MYRINGOTOMY AND TUBES      There were no vitals filed for this visit.   Subjective Assessment - 03/14/21 0852    Subjective Pt reports she has her good days and bad days. Denies any falls. Exercises doing good but walking backwards is challenging.    Pertinent History 55 y.o. female with medical history significant of alcohol abuse, astma,anxiety/depression, HTN, holmes-adie syndrome reprts 3-4 days of room spinning dizziness. At work today she had continued dizziness, hand cramping and weakness. She had a brief episode of LOC falling and striking posterior occiput. Coworkers witness some  whole body shaking lasting several seconds. EMS activated and patient transported to [facility] for evaluation.   CT head w/o acute findings, MRI brain w/o acute findings.    Patient Stated Goals Pt wants to improve her balance and be able to bend over and sit to without feeling like she will fall. Also wants to build up her strength.    Currently in Pain? No/denies                             [facility] Adult PT Treatment/Exercise - 03/14/21 0853      Ambulation/Gait   Ambulation/Gait Yes    Ambulation/Gait Assistance 5: Supervision;4: Min guard    Ambulation/Gait Assistance Details Verbal cues for sequencing at first. As went on PT cued pt to step bigger with RLE past cane and try to keep head up more. Also cued to relax left arm to try to get some arm swing.    Ambulation Distance (Feet) 345 Feet   850' outside   Assistive device Straight cane    Gait Pattern Step-through pattern;Decreased step length - right;Decreased step length - left;Decreased arm swing - left    Ambulation Surface Level;Unlevel;Indoor;Outdoor;Paved;Grass      Neuro Re-ed    Neuro Re-ed Details  Gait over obstacles with SPC: weaving in and out of 5 cones,  stepping over 2 2x4" bolsters x 3 laps including 115' around gym, alternating tapping cone then stepping over 5 x 4 bouts with SPC. At counter using [facility] marching forward then backwards gait 8' x 4 then with no UE support marching forwards and 1 UE support on counter backwards 8' x 4. Close SBA/CGA with activities for safety. Pt would be anxious at times and less steady but improved as went on.                  PT Education - 03/14/21 1210    Education Details Discussed purchasing SPC but will practice at least once more in clinic before release to use in home    Person(s) Educated Patient    Methods Explanation    Comprehension Verbalized understanding            PT Short Term Goals - 03/05/21 0948      PT SHORT TERM GOAL #1   Title Pt will  be independent with initial HEP for strength and balance.    Time 4    Period Weeks    Status New    Target Date 04/04/21      PT SHORT TERM GOAL #2   Title Pt will decrease  5 x sit to stand from 51.87 sec to <40 sec for improved balance and functional strength.    Baseline 03/04/21 51.87 sec from chair without hands    Time 4    Period Weeks    Status New    Target Date 04/04/21      PT SHORT TERM GOAL #3   Title Pt will increase Berg from 42 to >45/56 for improved balance and decreased fall risk.    Baseline 03/04/21 45/56    Time 4    Period Weeks    Status New    Target Date 04/04/21      PT SHORT TERM GOAL #4   Title Pt will ambulate >300' on level surfaces with LRAD mod I for improved mobility in home and short community distances.    Time 4    Period Weeks    Status New    Target Date 04/04/21             PT Long Term Goals - 03/05/21 0951      PT LONG TERM GOAL #1   Title Pt will be independent with progressive HEP for strength and balance to continue gains on own.    Time 8    Period Weeks    Status New    Target Date 05/04/21      PT LONG TERM GOAL #2   Title Pt will decrease 5 x sit to stand  to <30 sec for improved balance and functional strength.    Baseline 03/04/21 51.87 sec from chair with hands    Time 8    Period Weeks    Status New    Target Date 05/04/21      PT LONG TERM GOAL #3   Title Pt will increase gait speed from 0.36m/s to >0.28m/s for improved gait safety in the community.    Baseline 03/04/21 0.35m/s with walker    Time 8    Period Weeks    Status New    Target Date 05/04/21      PT LONG TERM GOAL #4   Title Pt will increase Berg to >50/56 for improved balance and decreased fall risk.    Baseline 03/04/21 42/56    Time  8    Period Weeks    Status New    Target Date 05/04/21      PT LONG TERM GOAL #5   Title Pt will ambulate up/down step in reciprocal pattern with 1 rail mod I for improved access to home.    Time 8     Period Weeks    Status New    Target Date 05/04/21                 Plan - 03/14/21 1211    Clinical Impression Statement Pt able to progress gait with SPC today supervision/CGA at times. Pt able to increase step length and relax more as went on. Gets anxious when gets unsteady but improves as goes on.    Personal Factors and Comorbidities Comorbidity 3+    Comorbidities alcohol abuse, astma,anxiety/depression, HTN, holmes-adie syndrome    Examination-Activity Limitations Locomotion Level;Transfers;Stairs;Stand    Examination-Participation Restrictions Community Activity;Occupation;Cleaning    Stability/Clinical Decision Making Evolving/Moderate complexity    Rehab Potential Good    PT Frequency 2x / week   plus eval   PT Duration 8 weeks    PT Treatment/Interventions ADLs/Self Care Home Management;DME Instruction;Gait training;Stair training;Functional mobility training;Therapeutic activities;Therapeutic exercise;Balance training;Neuromuscular re-education;Patient/family education;Vestibular;Visual/perceptual remediation/compensation    PT Next Visit Plan Pt has high anxiety. Gait training with SPC on varied surfaces and around obstacles. Continue balance activities.    Consulted and Agree with Plan of Care Patient           Patient will benefit from skilled therapeutic intervention in order to improve the following deficits and impairments:  Abnormal gait,Decreased balance,Decreased cognition,Decreased mobility,Decreased strength,Decreased coordination  Visit Diagnosis: Other abnormalities of gait and mobility  Unsteadiness on feet     Problem List Patient Active Problem List   Diagnosis Date Noted  . Hypomagnesemia 02/10/2021  . Generalized anxiety disorder 02/10/2021  . Insomnia 02/10/2021  . Fall due to seizure (HCC) 02/10/2021  . Head injury 02/10/2021  . Macrocytic anemia 02/10/2021  . 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 Seizure (HCC) 02/09/2021  . Elevated LFTs 02/09/2021  . Substance abuse (HCC) 02/18/2013  . Alcohol dependence (HCC) 02/18/2013  . Headache disorder 02/26/2012  . 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 MDD (major depressive disorder) 12/26/2010  . HTN (hypertension) 12/26/2010  . ALLERGIC RHINITIS 12/26/2010  . Asthma 12/26/2010    Ronn Melena, PT, DPT, NCS 03/14/2021, 12:14 PM  [facility] [address] [place], [place], [address] Phone: [phone]   Fax:  [phone]  Name: Kelly Zuniga MRN: [id number] Date of Birth: 1966-08-05

## 2021-03-18 ENCOUNTER — Other Ambulatory Visit: Payer: Self-pay

## 2021-03-18 ENCOUNTER — Ambulatory Visit
Admission: RE | Admit: 2021-03-18 | Discharge: 2021-03-18 | Disposition: A | Discharge status: Home / Self Care | Payer: No Typology Code available for payment source | Source: Ambulatory Visit | Attending: Registered Nurse | Admitting: Registered Nurse

## 2021-03-18 ENCOUNTER — Ambulatory Visit: Payer: No Typology Code available for payment source

## 2021-03-18 DIAGNOSIS — R2689 Other abnormalities of gait and mobility: Secondary | ICD-10-CM | POA: Diagnosis not present

## 2021-03-18 DIAGNOSIS — R41841 Cognitive communication deficit: Secondary | ICD-10-CM

## 2021-03-18 DIAGNOSIS — R4701 Aphasia: Secondary | ICD-10-CM

## 2021-03-18 DIAGNOSIS — R7989 Other specified abnormal findings of blood chemistry: Secondary | ICD-10-CM

## 2021-03-18 NOTE — Patient Instructions (Signed)
Do the homework from last session!

## 2021-03-18 NOTE — Therapy (Signed)
[facility] [address] [place], [place], [address] Phone: [phone]   Fax:  [phone]  Speech Language Pathology Treatment  Patient Details  Name: Kelly Zuniga MRN: [id number] Date of Birth: 22-Jul-1966 Referring Provider (SLP): [name], DO   Encounter Date: 03/18/2021   End of Session - 03/18/21 1648    Visit Number 3    Number of Visits 17    Date for SLP Re-Evaluation 06/02/21    SLP Start Time 1450    SLP Stop Time  1530    SLP Time Calculation (min) 40 min    Activity Tolerance Patient tolerated treatment well           Past Medical History:  Diagnosis Date  . Alcohol abuse   . Allergy   . Anxiety   . Asthma   . Asthma    Phreesia 11/09/2020  . Depression    posibly bipolar disorder. Sees Dr [name] for psychiatric care and Dr [name] for therapy  . Depression    Phreesia 11/09/2020  . Headache(784.0)    last migraine 2004  . 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 Hypertension    never on meds   . Seizure (HCC) 02/09/2021  . Substance abuse (HCC)    Phreesia 11/09/2020    Past Surgical History:  Procedure Laterality Date  . benign cyst     removed from left cheek  . TONSILECTOMY, ADENOIDECTOMY, BILATERAL MYRINGOTOMY AND TUBES      There were no vitals filed for this visit.   Subjective Assessment - 03/18/21 1500    Subjective "I've been better."    Currently in Pain? Yes    Pain Score 3     Pain Location Back    Pain Orientation Lower    Pain Descriptors / Indicators Sore    Pain Type Acute pain    Pain Onset Today                 ADULT SLP TREATMENT - 03/18/21 1501      General Information   Behavior/Cognition Alert;Cooperative;Pleasant mood      Treatment Provided   Treatment provided Cognitive-Linquistic      Cognitive-Linquistic Treatment   Treatment focused on Aphasia;Cognition    Skilled Treatment "I thought about you and your [facility] the other night. You didn't quite make it." Pt did not do any homework "I had a funeral." Cognitive tx (9 minutes): SLP ascertained that when pt misses her meds she turns smart speaker off instead of snoozing the alarm, which SLP suggested to pt. She will try this if involved in something else when med timer goes off when she is in the middle of something. (speech tx): Pt gave two examples of things she was doing in order to verbally generate her thought in an anomic incident. These two techniques (description, calming herself) have both been successful. Today in structured tasks pertienet to pt she req'd extra time to think of 6 items in mod complex categories.      Assessment / Recommendations / Plan   Plan Continue with current plan of care      Progression Toward Goals   Progression toward goals Progressing toward goals              SLP Short Term Goals - 03/18/21 1649      SLP SHORT TERM GOAL #1   Title pt will report successful usage of a med tracking system between 3 sessions  Time 3    Period Weeks   or 9 sessions, for all STGs   Status On-going      SLP SHORT TERM GOAL #2   Title pt will demonstrate WNL verbal expression in 15 minutes mod complex conversation in 4 sessions using compensations PRN    Time 3    Period Weeks    Status On-going      SLP SHORT TERM GOAL #3   Title pt will complete cognitive assessment in the first two therapy sessions    Period --   sessions   Status Achieved            SLP Long Term Goals - 03/18/21 1649      SLP LONG TERM GOAL #1   Title pt will demonstrate WNL verbal expression in 15 minutes mod complex-complex conversation in 4 sessions using compensations PRN    Time 7    Period Weeks   or 17 total sesisons, for all LTGs   Status On-going      SLP LONG TERM GOAL #2   Title pt will indicate a frustration level regarding aonmia which does not debilitate further conversation    Time 7    Period Weeks    Status On-going             Plan - 03/18/21 1649    Clinical Impression Statement Kelly Zuniga presents today with reported short term memory deficts, evidence of impulsivity and reduced frustration tolerance with anomia. CLQT completed this session, with all subtests WNL; however, pt demonstrated extended processing time for clock drawing, decreased recall for story retall, and difficulty with generative naming. Pt endorsed she is managing current frustration related to cognitive linguistic skills by removing self from situation. SLP believes pt has some degree of cognitive deficit, although not highlighted on CLQT. SLP believes that pt will benefit from skilled ST targeting cognition and verbal expression.    Speech Therapy Frequency 2x / week    Duration 8 weeks   or 17 total visits   Treatment/Interventions Environmental controls;Functional tasks;Compensatory techniques;SLP instruction and feedback;Multimodal communcation approach;Language facilitation;Cueing hierarchy;Cognitive reorganization;Patient/family education;Internal/external aids    Potential to Achieve Goals Fair    SLP Home Exercise Plan provided    Consulted and Agree with Plan of Care Patient           Patient will benefit from skilled therapeutic intervention in order to improve the following deficits and impairments:   Aphasia  Cognitive communication deficit    Problem List Patient Active Problem List   Diagnosis Date Noted  . Hypomagnesemia 02/10/2021  . Generalized anxiety disorder 02/10/2021  . Insomnia 02/10/2021  . Fall due to seizure (HCC) 02/10/2021  . Head injury 02/10/2021  . Macrocytic anemia 02/10/2021  . 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 Seizure (HCC) 02/09/2021  . Elevated LFTs 02/09/2021  . Substance abuse (HCC) 02/18/2013  . Alcohol dependence (HCC) 02/18/2013  . Headache disorder 02/26/2012  . 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 MDD (major depressive disorder)  12/26/2010  . HTN (hypertension) 12/26/2010  . ALLERGIC RHINITIS 12/26/2010  . Asthma 12/26/2010    [facility] ,MS, CCC-SLP  03/18/2021, 4:49 PM  [facility] [address] [place], [place], [address] Phone: [phone]   Fax:  [phone]   Name: Kelly Zuniga MRN: [id number] Date of Birth: Sep 03, 1966

## 2021-03-20 ENCOUNTER — Other Ambulatory Visit: Payer: Self-pay

## 2021-03-20 ENCOUNTER — Ambulatory Visit: Payer: No Typology Code available for payment source

## 2021-03-20 DIAGNOSIS — R2689 Other abnormalities of gait and mobility: Secondary | ICD-10-CM

## 2021-03-20 DIAGNOSIS — R41841 Cognitive communication deficit: Secondary | ICD-10-CM

## 2021-03-20 DIAGNOSIS — R4701 Aphasia: Secondary | ICD-10-CM

## 2021-03-20 DIAGNOSIS — R2681 Unsteadiness on feet: Secondary | ICD-10-CM

## 2021-03-20 NOTE — Therapy (Signed)
[facility] [address] [place], [place], [address] Phone: [phone]   Fax:  [phone]  Physical Therapy Treatment  Patient Details  Name: Kelly Zuniga MRN: [id number] Date of Birth: 02-06-1966 Referring Provider (PT): Referred by Kelly Zuniga (hospitalist) but PCP is Kelly Zuniga.   Encounter Date: 03/20/2021   PT End of Session - 03/20/21 1106    Visit Number 4    Number of Visits 17    PT Start Time 1105    PT Stop Time 1146    PT Time Calculation (min) 41 min    Equipment Utilized During Treatment Gait belt    Activity Tolerance Patient tolerated treatment well    Behavior During Therapy Anxious           Past Medical History:  Diagnosis Date  . Alcohol abuse   . Allergy   . Anxiety   . Asthma   . Asthma    Phreesia 11/09/2020  . Depression    posibly bipolar disorder. Sees Dr Kelly Zuniga for psychiatric care and Dr Kelly Zuniga for therapy  . Depression    Phreesia 11/09/2020  . Headache(784.0)    last migraine 2004  . 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 Hypertension    never on meds   . Seizure (HCC) 02/09/2021  . Substance abuse (HCC)    Phreesia 11/09/2020    Past Surgical History:  Procedure Laterality Date  . benign cyst     removed from left cheek  . TONSILECTOMY, ADENOIDECTOMY, BILATERAL MYRINGOTOMY AND TUBES      There were no vitals filed for this visit.   Subjective Assessment - 03/20/21 1107    Subjective Pt reports that she is doing well. Got her cane with a quad tip just so it would stand up and has been using around home. Reports turning is still challenging as well as if she bends down to get something. Had near fall when was bending down to get dog waterbowl. She did have an ultrasound on abdomen and has to have MRI as they found something blocking bile duct.    Pertinent History 55 y.o. female with medical history significant of alcohol abuse,  astma,anxiety/depression, HTN, holmes-adie syndrome reprts 3-4 days of room spinning dizziness. At work today she had continued dizziness, hand cramping and weakness. She had a brief episode of LOC falling and striking posterior occiput. Coworkers witness some whole body shaking lasting several seconds. EMS activated and patient transported to [facility] for evaluation.   CT head w/o acute findings, MRI brain w/o acute findings.    Patient Stated Goals Pt wants to improve her balance and be able to bend over and sit to without feeling like she will fall. Also wants to build up her strength.    Currently in Pain? No/denies                             [facility] Adult PT Treatment/Exercise - 03/20/21 1109      Ambulation/Gait   Ambulation/Gait Yes    Ambulation/Gait Assistance 5: Supervision    Ambulation/Gait Assistance Details around in session between activities    Assistive device Straight cane   with quad tip   Gait Pattern Step-through pattern;Decreased step length - right;Decreased step length - left;Decreased arm swing - left    Ambulation Surface Level;Indoor      Neuro Re-ed    Neuro Re-ed Details  Gait around obstacle course with  new cane with quad tip 345' with weaving in and out of 4 cones on path and over blue mat for 3 laps around each CGA. Dynamic gait activities in hallway with cane: gait with stop/go 40' x 2, gait with horizontal head turns 40' x 2 then vertical head turns 40' x 2. Pt slowed steps with head turns and was less steady with horizontal. Marching gait 40' x 2. Standing on airex squatting down to reach for 5 cones on stool x 2 then repeated with cones on floor 5 x 2 CGA. Standing on airex: tapping 2 different colored cones with whichever foot PT called out with color x 2 min, standing feet together eyes closed 30 sec x 2, standing feet together with head turns left/right x 10.                  PT Education - 03/20/21 1828    Education Details Added  corner balance activities    Person(s) Educated Patient    Methods Explanation;Demonstration;Handout    Comprehension Verbalized understanding            PT Short Term Goals - 03/05/21 0948      PT SHORT TERM GOAL #1   Title Pt will be independent with initial HEP for strength and balance.    Time 4    Period Weeks    Status New    Target Date 04/04/21      PT SHORT TERM GOAL #2   Title Pt will decrease  5 x sit to stand from 51.87 sec to <40 sec for improved balance and functional strength.    Baseline 03/04/21 51.87 sec from chair without hands    Time 4    Period Weeks    Status New    Target Date 04/04/21      PT SHORT TERM GOAL #3   Title Pt will increase Berg from 42 to >45/56 for improved balance and decreased fall risk.    Baseline 03/04/21 45/56    Time 4    Period Weeks    Status New    Target Date 04/04/21      PT SHORT TERM GOAL #4   Title Pt will ambulate >300' on level surfaces with LRAD mod I for improved mobility in home and short community distances.    Time 4    Period Weeks    Status New    Target Date 04/04/21             PT Long Term Goals - 03/05/21 0951      PT LONG TERM GOAL #1   Title Pt will be independent with progressive HEP for strength and balance to continue gains on own.    Time 8    Period Weeks    Status New    Target Date 05/04/21      PT LONG TERM GOAL #2   Title Pt will decrease 5 x sit to stand  to <30 sec for improved balance and functional strength.    Baseline 03/04/21 51.87 sec from chair with hands    Time 8    Period Weeks    Status New    Target Date 05/04/21      PT LONG TERM GOAL #3   Title Pt will increase gait speed from 0.57m/s to >0.33m/s for improved gait safety in the community.    Baseline 03/04/21 0.71m/s with walker    Time 8    Period Weeks  Status New    Target Date 05/04/21      PT LONG TERM GOAL #4   Title Pt will increase Berg to >50/56 for improved balance and decreased fall risk.     Baseline 03/04/21 42/56    Time 8    Period Weeks    Status New    Target Date 05/04/21      PT LONG TERM GOAL #5   Title Pt will ambulate up/down step in reciprocal pattern with 1 rail mod I for improved access to home.    Time 8    Period Weeks    Status New    Target Date 05/04/21                 Plan - 03/20/21 1829    Clinical Impression Statement Pt continues to show improved stability with cane. When she gets off sequence it does throw her some. She was challenged when added in head turns with gait significantly decreasing gait speed and step length.    Personal Factors and Comorbidities Comorbidity 3+    Comorbidities alcohol abuse, astma,anxiety/depression, HTN, holmes-adie syndrome    Examination-Activity Limitations Locomotion Level;Transfers;Stairs;Stand    Examination-Participation Restrictions Community Activity;Occupation;Cleaning    Stability/Clinical Decision Making Evolving/Moderate complexity    Rehab Potential Good    PT Frequency 2x / week   plus eval   PT Duration 8 weeks    PT Treatment/Interventions ADLs/Self Care Home Management;DME Instruction;Gait training;Stair training;Functional mobility training;Therapeutic activities;Therapeutic exercise;Balance training;Neuromuscular re-education;Patient/family education;Vestibular;Visual/perceptual remediation/compensation    PT Next Visit Plan Pt has high anxiety. Gait training with SPC on varied surfaces and around obstacles. Continue balance activities on compliant surface. How are new corner balance activities going?    Consulted and Agree with Plan of Care Patient           Patient will benefit from skilled therapeutic intervention in order to improve the following deficits and impairments:  Abnormal gait,Decreased balance,Decreased cognition,Decreased mobility,Decreased strength,Decreased coordination  Visit Diagnosis: Other abnormalities of gait and mobility  Unsteadiness on feet     Problem  List Patient Active Problem List   Diagnosis Date Noted  . Hypomagnesemia 02/10/2021  . Generalized anxiety disorder 02/10/2021  . Insomnia 02/10/2021  . Fall due to seizure (HCC) 02/10/2021  . Head injury 02/10/2021  . Macrocytic anemia 02/10/2021  . 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 Seizure (HCC) 02/09/2021  . Elevated LFTs 02/09/2021  . Substance abuse (HCC) 02/18/2013  . Alcohol dependence (HCC) 02/18/2013  . Headache disorder 02/26/2012  . 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 MDD (major depressive disorder) 12/26/2010  . HTN (hypertension) 12/26/2010  . ALLERGIC RHINITIS 12/26/2010  . Asthma 12/26/2010    Ronn Melena, PT, DPT, NCS 03/20/2021, 6:31 PM  [facility] [address] [place], [place], [address] Phone: [phone]   Fax:  [phone]  Name: Cyntha Brickman MRN: [id number] Date of Birth: 04-18-1966

## 2021-03-20 NOTE — Patient Instructions (Signed)
   Neuropsychology services  - at [facility]  (O)  [phone]  Dr. Milbert Coulter, Dr. Roseanne Reno  - at [facility] (O) [phone]  Dr. Vella Kohler, Dr. Kieth Brightly

## 2021-03-20 NOTE — Patient Instructions (Signed)
Access Code: [email or web address] URL: [email or web address] Date: 03/20/2021 Prepared by: Elmer Bales  Exercises Sit to Stand - 2 x daily - 7 x weekly - 2 sets - 5 reps Side Stepping with Counter Support - 2 x daily - 7 x weekly - 1 sets - 3 reps Walking March - 2 x daily - 7 x weekly - 1 sets - 3 reps Backward Walking with Counter Support - 2 x daily - 7 x weekly - 1 sets - 3 reps Romberg Stance Eyes Closed on Foam Pad - 2 x daily - 7 x weekly - 1 sets - 3 reps - 20 sec hold Standing head turns - 2 x daily - 7 x weekly - 2 sets - 10 reps

## 2021-03-20 NOTE — Therapy (Signed)
[facility] [address] [place], [place], [address] Phone: [phone]   Fax:  [phone]  Speech Language Pathology Treatment  Patient Details  Name: Kelly Zuniga MRN: [id number] Date of Birth: 20-Jul-1966 Referring Provider (SLP): [name], DO   Encounter Date: 03/20/2021   End of Session - 03/20/21 2256    Visit Number 4    Number of Visits 17    Date for SLP Re-Evaluation 06/02/21    SLP Start Time 1149    SLP Stop Time  1230    SLP Time Calculation (min) 41 min    Activity Tolerance Patient tolerated treatment well           Past Medical History:  Diagnosis Date  . Alcohol abuse   . Allergy   . Anxiety   . Asthma   . Asthma    Phreesia 11/09/2020  . Depression    posibly bipolar disorder. Sees Dr [name] for psychiatric care and Dr [name] for therapy  . Depression    Phreesia 11/09/2020  . Headache(784.0)    last migraine 2004  . 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 Hypertension    never on meds   . Seizure (HCC) 02/09/2021  . Substance abuse (HCC)    Phreesia 11/09/2020    Past Surgical History:  Procedure Laterality Date  . benign cyst     removed from left cheek  . TONSILECTOMY, ADENOIDECTOMY, BILATERAL MYRINGOTOMY AND TUBES      There were no vitals filed for this visit.   Subjective Assessment - 03/20/21 1151    Subjective "I have new homework for PT. I'm having problems with side to side so I'm working on that."    Currently in Pain? No/denies                 ADULT SLP TREATMENT - 03/20/21 1151      General Information   Behavior/Cognition Alert;Cooperative;Pleasant mood      Treatment Provided   Treatment provided Cognitive-Linquistic      Cognitive-Linquistic Treatment   Treatment focused on Aphasia;Cognition    Skilled Treatment "There have been so many (anomic episodes) I can't think of one." SLP learned through therapeutic interview that pt is  using compensations more, instead of just getting frustrated. Long discussion about frustration and anxiety about anomia and pt feeling like she has to apologize for anomia to family and friends. SLP helped pt realize via conversation that she didn't have to apologize for a medical condition, and that instead she acknowledge the anomia and ask for person's patience. SLP provided pt with two types of aphasia cards for her to choose the one she prefers more. Pt thanked SLP for providing these for her. SLP talked about neuropsych services and provided names/contact information for practices with neuropsych services.      Assessment / Recommendations / Plan   Plan Continue with current plan of care      Progression Toward Goals   Progression toward goals Progressing toward goals            SLP Education - 03/20/21 2255    Education Details neuropsych, pt should not apologize for anomia    Person(s) Educated Patient    Methods Explanation;Handout    Comprehension Verbalized understanding            SLP Short Term Goals - 03/20/21 2258      SLP SHORT TERM GOAL #1   Title pt will report successful usage  of a med tracking system between 3 sessions    Time 3    Period Weeks   or 9 sessions, for all STGs   Status On-going      SLP SHORT TERM GOAL #2   Title pt will demonstrate WNL verbal expression in 15 minutes mod complex conversation in 4 sessions using compensations PRN    Time 3    Period Weeks    Status On-going      SLP SHORT TERM GOAL #3   Title pt will complete cognitive assessment in the first two therapy sessions    Period --   sessions   Status Achieved            SLP Long Term Goals - 03/20/21 2258      SLP LONG TERM GOAL #1   Title pt will demonstrate WNL verbal expression in 15 minutes mod complex-complex conversation in 4 sessions using compensations PRN    Time 7    Period Weeks   or 17 total sesisons, for all LTGs   Status On-going      SLP LONG TERM GOAL  #2   Title pt will indicate a frustration level regarding aonmia which does not debilitate further conversation    Time 7    Period Weeks    Status On-going            Plan - 03/20/21 2256    Clinical Impression Statement Kelly Zuniga presents today with reported short term memory deficts, evidence of impulsivity and reduced frustration tolerance with anomia. Pt endorsed she is managing current frustration related to cognitive linguistic skills by telling herself to "think of the word, [name]" and taking a breath. Pt took aphasia card today and SLP suggested neuropsych services. SLP believes pt has some degree of cognitive deficit, although not highlighted on CLQT. SLP believes that pt will benefit from skilled ST targeting cognition and verbal expression.    Speech Therapy Frequency 2x / week    Duration 8 weeks   or 17 total visits   Treatment/Interventions Environmental controls;Functional tasks;Compensatory techniques;SLP instruction and feedback;Multimodal communcation approach;Language facilitation;Cueing hierarchy;Cognitive reorganization;Patient/family education;Internal/external aids    Potential to Achieve Goals Fair    SLP Home Exercise Plan provided    Consulted and Agree with Plan of Care Patient           Patient will benefit from skilled therapeutic intervention in order to improve the following deficits and impairments:   Aphasia  Cognitive communication deficit    Problem List Patient Active Problem List   Diagnosis Date Noted  . Hypomagnesemia 02/10/2021  . Generalized anxiety disorder 02/10/2021  . Insomnia 02/10/2021  . Fall due to seizure (HCC) 02/10/2021  . Head injury 02/10/2021  . Macrocytic anemia 02/10/2021  . 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 Seizure (HCC) 02/09/2021  . Elevated LFTs 02/09/2021  . Substance abuse (HCC) 02/18/2013  . Alcohol dependence (HCC) 02/18/2013  . Headache disorder 02/26/2012  .  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 MDD (major depressive disorder) 12/26/2010  . HTN (hypertension) 12/26/2010  . ALLERGIC RHINITIS 12/26/2010  . Asthma 12/26/2010    [facility] ,MS, CCC-SLP  03/20/2021, 10:58 PM  [facility] [address] [place], [place], [address] Phone: [phone]   Fax:  [phone]   Name: Kelly Zuniga MRN: [id number] Date of Birth: 07/08/1966

## 2021-03-21 ENCOUNTER — Telehealth: Payer: Self-pay | Admitting: Registered Nurse

## 2021-03-21 NOTE — Telephone Encounter (Signed)
Pt called in asking for the results of the [place] that done on 03/18/21, pt is aware that Gerlene Burdock is out of the office until Tuesday of next week.   Pt can be reached at the home #

## 2021-03-21 NOTE — Telephone Encounter (Signed)
Please advise patient would like to discuss results

## 2021-03-25 ENCOUNTER — Other Ambulatory Visit: Payer: Self-pay

## 2021-03-25 ENCOUNTER — Encounter: Payer: Self-pay | Admitting: Registered Nurse

## 2021-03-25 ENCOUNTER — Ambulatory Visit (INDEPENDENT_AMBULATORY_CARE_PROVIDER_SITE_OTHER): Payer: No Typology Code available for payment source | Admitting: Registered Nurse

## 2021-03-25 ENCOUNTER — Ambulatory Visit: Payer: No Typology Code available for payment source

## 2021-03-25 ENCOUNTER — Ambulatory Visit: Payer: No Typology Code available for payment source | Attending: Internal Medicine

## 2021-03-25 VITALS — BP 128/76 | HR 91 | Temp 98.1°F | Resp 15 | Ht 60.0 in | Wt 96.6 lb

## 2021-03-25 DIAGNOSIS — R4701 Aphasia: Secondary | ICD-10-CM

## 2021-03-25 DIAGNOSIS — K838 Other specified diseases of biliary tract: Secondary | ICD-10-CM

## 2021-03-25 DIAGNOSIS — R2689 Other abnormalities of gait and mobility: Secondary | ICD-10-CM | POA: Diagnosis not present

## 2021-03-25 DIAGNOSIS — R41841 Cognitive communication deficit: Secondary | ICD-10-CM | POA: Diagnosis present

## 2021-03-25 DIAGNOSIS — R9431 Abnormal electrocardiogram [ECG] [EKG]: Secondary | ICD-10-CM | POA: Diagnosis not present

## 2021-03-25 DIAGNOSIS — F329 Major depressive disorder, single episode, unspecified: Secondary | ICD-10-CM

## 2021-03-25 DIAGNOSIS — R7989 Other specified abnormal findings of blood chemistry: Secondary | ICD-10-CM | POA: Diagnosis not present

## 2021-03-25 DIAGNOSIS — I69398 Other sequelae of cerebral infarction: Secondary | ICD-10-CM

## 2021-03-25 DIAGNOSIS — R569 Unspecified convulsions: Secondary | ICD-10-CM

## 2021-03-25 DIAGNOSIS — M6281 Muscle weakness (generalized): Secondary | ICD-10-CM

## 2021-03-25 DIAGNOSIS — R2681 Unsteadiness on feet: Secondary | ICD-10-CM | POA: Diagnosis present

## 2021-03-25 NOTE — Progress Notes (Signed)
Established Patient Office Visit  Subjective:  Patient ID: Kelly Zuniga, female    DOB: Apr 03, 1966  Age: 55 y.o. MRN: [id number]  CC:  Chief Complaint  Patient presents with  . Follow-up    Pt reports had [place] last Tuesday and would like to review and set next steps,  Also collapsed on Saturday but okay since     HPI Kelly Zuniga presents for [place] follow up  Imaging noted dilated bile duct, apparently more so than imagine in February 2022, though noted that imaging in Feb was technically challenging due to pt altered mental status. Pt interested in next step. No further hepatic symptoms. No GI symptoms. Denies EtOH or drug consumption since last visit.  Did have a brief episode of syncope and collapse on Saturday evening when taking her dog for a walk. Brief LOC. No chest pain or other changes. Did not have drugs or EtOH. Feeling fine since. EKG in ER was abnormal but inconclusive. Has not seen cardiology.  Aphasia treatment with speech therapy going well, but unfortunately this is taking a toll on her mental health. Would be interested in seeing neuropsychology for this.   Past Medical History:  Diagnosis Date  . Alcohol abuse   . Allergy   . Anxiety   . Asthma   . Asthma    Phreesia 11/09/2020  . Depression    posibly bipolar disorder. Sees Dr [name] for psychiatric care and Dr [name] for therapy  . Depression    Phreesia 11/09/2020  . Headache(784.0)    last migraine 2004  . 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 Hypertension    never on meds   . Seizure (HCC) 02/09/2021  . Substance abuse (HCC)    Phreesia 11/09/2020    Past Surgical History:  Procedure Laterality Date  . benign cyst     removed from left cheek  . TONSILECTOMY, ADENOIDECTOMY, BILATERAL MYRINGOTOMY AND TUBES      Family History  Problem Relation Age of Onset  . Coronary artery disease Other        1st degree relative female <50  . Depression Other        Family Hx  . Diabetes Other         Family Hx 1st degree relative  . Ovarian cancer Other        Family Hx   . Healthy Mother   . Diabetes Father   . Gout Father   . Hypertension Father   . Heart failure Father   . Cancer Father     Social History   Socioeconomic History  . Marital status: Significant Other    Spouse name: Not on file  . Number of children: 2  . Years of education: Not on file  . Highest education level: Not on file  Occupational History  . Not on file  Tobacco Use  . Smoking status: Current Every Day Smoker    Packs/day: 0.50    Types: Cigarettes  . Smokeless tobacco: Never Used  . Tobacco comment: Using the E cigarette to help her quit & still smoking  Vaping Use  . Vaping Use: Never used  Substance and Sexual Activity  . Alcohol use: Yes    Comment: 1 glass of wine per day or less  . Drug use: No  . Sexual activity: Not on file  Other Topics Concern  . Not on file  Social History Narrative  . Not on file   Social Determinants of Health  Financial Resource Strain: Not on file  Food Insecurity: Not on file  Transportation Needs: Not on file  Physical Activity: Not on file  Stress: Not on file  Social Connections: Not on file  Intimate Partner Violence: Not on file    Outpatient Medications Prior to Visit  Medication Sig Dispense Refill  . albuterol (PROVENTIL) (2.5 MG/3ML) 0.083% nebulizer solution Take 2.5 mg by nebulization every 6 (six) hours as needed for wheezing or shortness of breath.    Marland Kitchen albuterol (VENTOLIN HFA) 108 (90 Base) MCG/ACT inhaler Inhale 2 puffs into the lungs See admin instructions. Inhale 2 puffs into the lungs every 4-6 hours as needed for shortness of breath or wheezing    . BIOTIN PO Take 1 tablet by mouth in the morning.    . clonazePAM (KLONOPIN) 0.5 MG tablet Take 1 tablet (0.5 mg total) by mouth 3 (three) times daily as needed for anxiety. 90 tablet 2  . escitalopram (LEXAPRO) 10 MG tablet Take one tablet daily. (Patient taking differently:  Take 10 mg by mouth in the morning.) 30 tablet 5  . fexofenadine (ALLEGRA) 180 MG tablet Take 180 mg by mouth in the morning.    . folic acid (FOLVITE) 1 MG tablet Take 1 tablet (1 mg total) by mouth daily. (Can be purchased from over the counter) :For Folic acid deficiency 30 tablet 0  . levETIRAcetam (KEPPRA) 500 MG tablet Take 1 tablet (500 mg total) by mouth 2 (two) times daily. 60 tablet 1  . magnesium 30 MG tablet Take 30 mg by mouth 2 (two) times daily. Takes 1x/day.    . Multiple Vitamins-Minerals (MULTI ADULT GUMMIES) CHEW Chew 2 tablets by mouth in the morning.    . naproxen sodium (ALEVE) 220 MG tablet Take 220 mg by mouth 2 (two) times daily as needed (for mild pain).    Marland Kitchen thiamine 100 MG tablet Take 1 tablet (100 mg total) by mouth daily. 30 tablet 0  . topiramate (TOPAMAX) 25 MG tablet Take 1 tablet (25 mg total) by mouth 2 (two) times daily. 60 tablet 1  . triamcinolone (NASACORT) 55 MCG/ACT nasal inhaler Place 2 sprays into the nose daily. For allergies (Patient taking differently: Place 2 sprays into the nose in the morning.) 1 Inhaler 11  . vitamin B-12 (CYANOCOBALAMIN) 100 MCG tablet Take 100 mcg by mouth daily.     No facility-administered medications prior to visit.    Allergies  Allergen Reactions  . Acetaminophen Other (See Comments)    Makes LFT get elevated and causes liver problems/issues   . Lithium Other (See Comments)    Causes the patient to have no appetite  . Promethazine-Codeine Nausea And Vomiting and Other (See Comments)    Any codeine-based cough syrups cause N/V  . Codeine Nausea And Vomiting  . Morphine And Related Nausea And Vomiting    ROS Review of Systems  Constitutional: Negative.   HENT: Negative.   Eyes: Negative.   Respiratory: Negative.   Cardiovascular: Negative.   Gastrointestinal: Negative.   Genitourinary: Negative.   Musculoskeletal: Negative.   Skin: Negative.   Neurological: Negative.   Psychiatric/Behavioral: Negative.    All other systems reviewed and are negative.     Objective:    Physical Exam Vitals and nursing note reviewed.  Constitutional:      General: She is not in acute distress.    Appearance: Normal appearance. She is normal weight. She is not ill-appearing, toxic-appearing or diaphoretic.  Cardiovascular:  Rate and Rhythm: Normal rate and regular rhythm.     Heart sounds: Normal heart sounds. No murmur heard. No friction rub. No gallop.   Pulmonary:     Effort: Pulmonary effort is normal. No respiratory distress.     Breath sounds: Normal breath sounds. No stridor. No wheezing, rhonchi or rales.  Chest:     Chest wall: No tenderness.  Skin:    General: Skin is warm and dry.  Neurological:     General: No focal deficit present.     Mental Status: She is alert and oriented to person, place, and time. Mental status is at baseline.  Psychiatric:        Mood and Affect: Mood normal.        Behavior: Behavior normal.        Thought Content: Thought content normal.        Judgment: Judgment normal.     BP 128/76   Pulse 91   Temp 98.1 F (36.7 C) (Temporal)   Resp 15   Ht 5' (1.524 m)   Wt 96 lb 9.6 oz (43.8 kg)   SpO2 98%   BMI 18.87 kg/m  Wt Readings from Last 3 Encounters:  03/25/21 96 lb 9.6 oz (43.8 kg)  02/24/21 91 lb 12.8 oz (41.6 kg)  02/10/21 103 lb 6.3 oz (46.9 kg)     Health Maintenance Due  Topic Date Due  . PAP SMEAR-Modifier  02/11/2014  . MAMMOGRAM  11/07/2017  . COVID-19 Vaccine (3 - Booster for Pfizer series) 09/30/2020    There are no preventive care reminders to display for this patient.  Lab Results  Component Value Date   TSH 1.680 11/12/2020   Lab Results  Component Value Date   WBC 5.5 02/24/2021   HGB 11.6 02/24/2021   HCT 34.1 02/24/2021   MCV 98 (H) 02/24/2021   PLT 189 02/12/2021   Lab Results  Component Value Date   NA 138 02/24/2021   K 4.0 02/24/2021   CO2 20 02/24/2021   GLUCOSE 71 02/24/2021   BUN 7 02/24/2021    CREATININE 0.54 (L) 02/24/2021   BILITOT 0.3 02/24/2021   ALKPHOS 84 02/24/2021   AST 74 (H) 02/24/2021   ALT 52 (H) 02/24/2021   PROT 6.9 02/24/2021   ALBUMIN 4.9 02/24/2021   CALCIUM 9.9 02/24/2021   ANIONGAP 12 02/12/2021   GFR 155.91 02/28/2013   Lab Results  Component Value Date   CHOL 232 (H) 11/12/2020   Lab Results  Component Value Date   HDL 88 11/12/2020   Lab Results  Component Value Date   LDLCALC 131 (H) 11/12/2020   Lab Results  Component Value Date   TRIG 77 11/12/2020   Lab Results  Component Value Date   CHOLHDL 2.6 11/12/2020   Lab Results  Component Value Date   HGBA1C 4.9 11/12/2020      Assessment & Plan:   Problem List Items Addressed This Visit      Other   Elevated LFTs   Relevant Orders   MR ABDOMEN MRCP W WO CONTAST    Other Visit Diagnoses    Dilated bile duct    -  Primary   Relevant Orders   MR ABDOMEN MRCP W WO CONTAST   New onset seizure [facility](HCC)       Relevant Orders   MR ABDOMEN MRCP W WO CONTAST   Abnormal EKG       Relevant Orders   Ambulatory referral to Cardiology  No orders of the defined types were placed in this encounter.   Follow-up: No follow-ups on file.   PLAN  MRCP ordered. Will follow up as indicated  Refer to cardiology for syncope. Suspect secondary to hepatic dysfunction but will refer to help rule out cardiac abnormality.  Refer to neuropsych. Agree that patient could benefit from this.  Patient encouraged to call clinic with any questions, comments, or concerns.   [name], NP

## 2021-03-25 NOTE — Therapy (Signed)
[facility] [address] [place], [place], [address] Phone: [phone]   Fax:  [phone]  Speech Language Pathology Treatment  Patient Details  Name: Kelly Zuniga MRN: [id number] Date of Birth: Mar 31, 1966 Referring Provider (SLP): [name], DO   Encounter Date: 03/25/2021   End of Session - 03/25/21 1112    Visit Number 5    Number of Visits 17    Date for SLP Re-Evaluation 06/02/21    SLP Start Time 1103   arrived 3 mins late   SLP Stop Time  1145    SLP Time Calculation (min) 42 min    Activity Tolerance Other (comment)           Past Medical History:  Diagnosis Date  . Alcohol abuse   . Allergy   . Anxiety   . Asthma   . Asthma    Phreesia 11/09/2020  . Depression    posibly bipolar disorder. Sees Dr [name] for psychiatric care and Dr [name] for therapy  . Depression    Phreesia 11/09/2020  . Headache(784.0)    last migraine 2004  . 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 Hypertension    never on meds   . Seizure (HCC) 02/09/2021  . Substance abuse (HCC)    Phreesia 11/09/2020    Past Surgical History:  Procedure Laterality Date  . benign cyst     removed from left cheek  . TONSILECTOMY, ADENOIDECTOMY, BILATERAL MYRINGOTOMY AND TUBES      There were no vitals filed for this visit.   Subjective Assessment - 03/25/21 1106    Subjective "I've had better days. I'm a hot mess."    Pain Score 5     Pain Location Knee   and hip   Pain Descriptors / Indicators Aching    Pain Type Acute pain    Pain Onset In the past 7 days    Multiple Pain Sites Yes                 ADULT SLP TREATMENT - 03/25/21 1111      General Information   Behavior/Cognition Alert;Cooperative;Pleasant mood      Treatment Provided   Treatment provided Cognitive-Linquistic      Cognitive-Linquistic Treatment   Treatment focused on Aphasia;Cognition;Patient/family/caregiver education     Skilled Treatment Pt reported she believes she had "mini stroke" on Saturday resulting in fall and increased word finding. Pt had MD appointment today with recommendation for "full body MRI." Pt reports increased word finding difficulty has improved since Saturday. Frustration endorsed re: current level of functioning, with primary SLP recommending neuropysch evaluation. SLP engaged patient in short structured conversations, with rare word finding exhibited and occasional halting/dysfluent speech noted. Pt able to compensate with pausing and descriptions utilized independently.      Assessment / Recommendations / Plan   Plan Continue with current plan of care      Progression Toward Goals   Progression toward goals Progressing toward goals            SLP Education - 03/25/21 1414    Education Details HEP, neuropysch    Person(s) Educated Patient    Methods Explanation;Demonstration    Comprehension Verbalized understanding;Returned demonstration            SLP Short Term Goals - 03/25/21 1142      SLP SHORT TERM GOAL #1   Title pt will report successful usage of a med tracking system between 3 sessions  Baseline 03-25-21 (Alexa timer),    Time 2    Period Weeks   or 9 sessions, for all STGs   Status On-going      SLP SHORT TERM GOAL #2   Title pt will demonstrate WNL verbal expression in 15 minutes mod complex conversation in 4 sessions using compensations PRN    Time 2    Period Weeks    Status On-going      SLP SHORT TERM GOAL #3   Title pt will complete cognitive assessment in the first two therapy sessions    Period --   sessions   Status Achieved            SLP Long Term Goals - 03/25/21 1143      SLP LONG TERM GOAL #1   Title pt will demonstrate WNL verbal expression in 15 minutes mod complex-complex conversation in 4 sessions using compensations PRN    Time 6    Period Weeks   or 17 total sesisons, for all LTGs   Status On-going      SLP LONG TERM GOAL #2    Title pt will indicate a frustration level regarding aonmia which does not debilitate further conversation    Time 6    Period Weeks    Status On-going            Plan - 03/25/21 1415    Clinical Impression Statement Mikenzi presents today with reported short term memory deficts, evidence of impulsivity and reduced frustration tolerance with anomia. Pt initally presented with some emotional lability upon entrance, which resolved throughout session. SLP targeted word finding in short structured conversation with rare word finding and occasional halting/dysfluency exhibited. SLP believes pt has some degree of cognitive deficit, although not highlighted on CLQT. SLP believes that pt will benefit from skilled ST targeting cognition and verbal expression.    Speech Therapy Frequency 2x / week    Duration 8 weeks   or 17 total visits   Treatment/Interventions Environmental controls;Functional tasks;Compensatory techniques;SLP instruction and feedback;Multimodal communcation approach;Language facilitation;Cueing hierarchy;Cognitive reorganization;Patient/family education;Internal/external aids    Potential to Achieve Goals Fair    SLP Home Exercise Plan provided    Consulted and Agree with Plan of Care Patient           Patient will benefit from skilled therapeutic intervention in order to improve the following deficits and impairments:   Aphasia  Cognitive communication deficit    Problem List Patient Active Problem List   Diagnosis Date Noted  . Hypomagnesemia 02/10/2021  . Generalized anxiety disorder 02/10/2021  . Insomnia 02/10/2021  . Fall due to seizure (HCC) 02/10/2021  . Head injury 02/10/2021  . Macrocytic anemia 02/10/2021  . 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 Seizure (HCC) 02/09/2021  . Elevated LFTs 02/09/2021  . Substance abuse (HCC) 02/18/2013  . Alcohol dependence (HCC) 02/18/2013  . Headache disorder 02/26/2012  .  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 MDD (major depressive disorder) 12/26/2010  . HTN (hypertension) 12/26/2010  . ALLERGIC RHINITIS 12/26/2010  . Asthma 12/26/2010    Janann Colonel, MA CCC-SLP 03/25/2021, 2:19 PM  [facility] [address] [place], [place], [address] Phone: [phone]   Fax:  [phone]   Name: Hisako Bugh MRN: [id number] Date of Birth: [birth year]

## 2021-03-25 NOTE — Therapy (Signed)
[facility] [address] [place], [place], [address] Phone: [phone]   Fax:  [phone]  Physical Therapy Treatment  Patient Details  Name: Kelly Zuniga MRN: [id number] Date of Birth: 1966/01/02 Referring Provider (PT): Referred by [name] (hospitalist) but PCP is [name].   Encounter Date: 03/25/2021   PT End of Session - 03/25/21 1153    Visit Number 5    Number of Visits 17    PT Start Time 1151    PT Stop Time 1230    PT Time Calculation (min) 39 min    Equipment Utilized During Treatment Gait belt    Activity Tolerance Patient tolerated treatment well    Behavior During Therapy Anxious           Past Medical History:  Diagnosis Date  . Alcohol abuse   . Allergy   . Anxiety   . Asthma   . Asthma    Phreesia 11/09/2020  . Depression    posibly bipolar disorder. Sees Dr [name] for psychiatric care and Dr [name] for therapy  . Depression    Phreesia 11/09/2020  . Headache(784.0)    last migraine 2004  . 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 Hypertension    never on meds   . Seizure (HCC) 02/09/2021  . Substance abuse (HCC)    Phreesia 11/09/2020    Past Surgical History:  Procedure Laterality Date  . benign cyst     removed from left cheek  . TONSILECTOMY, ADENOIDECTOMY, BILATERAL MYRINGOTOMY AND TUBES      Vitals:     Subjective Assessment - 03/25/21 1154    Subjective Pt reports that she got up to take dog out on Saturday and she had a syncopal episode. She has scrape on her left forehead, right knee and reports right hip bruised.  She had no warning when occurred. Saw PCP today about [place] on abdomen and will be having an MRI as noted some abnormality at bile duct.    Pertinent History 55 y.o. female with medical history significant of alcohol abuse, astma,anxiety/depression, HTN, holmes-adie syndrome reprts 3-4 days of room spinning dizziness. At work today she had  continued dizziness, hand cramping and weakness. She had a brief episode of LOC falling and striking posterior occiput. Coworkers witness some whole body shaking lasting several seconds. EMS activated and patient transported to [facility] for evaluation.   CT head w/o acute findings, MRI brain w/o acute findings.    Patient Stated Goals Pt wants to improve her balance and be able to bend over and sit to without feeling like she will fall. Also wants to build up her strength.    Currently in Pain? Yes    Pain Score 8     Pain Location Knee    Pain Orientation Right    Pain Descriptors / Indicators Aching    Pain Type Acute pain    Pain Onset In the past 7 days    Pain Frequency Intermittent    Aggravating Factors  since fall Saturday    Multiple Pain Sites Yes    Pain Score 6    Pain Location Hip    Pain Orientation Right    Pain Descriptors / Indicators Aching    Pain Type Acute pain    Pain Onset In the past 7 days    Pain Frequency Intermittent    Aggravating Factors  since fall Saturday    Pain Score 3    Pain Location  Head    Pain Orientation Anterior    Pain Descriptors / Indicators Sore    Pain Type Acute pain    Pain Onset In the past 7 days    Pain Frequency Intermittent    Aggravating Factors  scrape from fall Saturday                             [facility] Adult PT Treatment/Exercise - 03/25/21 1203      Ambulation/Gait   Ambulation/Gait Yes    Ambulation/Gait Assistance 5: Supervision;4: Min guard    Ambulation/Gait Assistance Details Pt at times leaning to right slightly. Verbal cues to try to increase left arm swing. HR=98    Ambulation Distance (Feet) 850 Feet    Assistive device Straight cane   with quad tip   Gait Pattern Step-through pattern;Decreased step length - right;Decreased step length - left;Decreased arm swing - left;Narrow base of support    Ambulation Surface Level;Unlevel;Indoor;Outdoor;Paved;Grass      Neuro Re-ed    Neuro Re-ed Details   Along counter: walking over blue mat without AD 6' x 8 with verbal cues to increase step length. Standing feet apart with UE D1 diagonals with 1.1# med ball x 5 each direction then feet together with trunk rotation handing ball to therapist at side x 10 each way CGA. Verbal cues to keep tummy tight to help stabilize.                    PT Short Term Goals - 03/05/21 0948      PT SHORT TERM GOAL #1   Title Pt will be independent with initial HEP for strength and balance.    Time 4    Period Weeks    Status New    Target Date 04/04/21      PT SHORT TERM GOAL #2   Title Pt will decrease  5 x sit to stand from 51.87 sec to <40 sec for improved balance and functional strength.    Baseline 03/04/21 51.87 sec from chair without hands    Time 4    Period Weeks    Status New    Target Date 04/04/21      PT SHORT TERM GOAL #3   Title Pt will increase Berg from 42 to >45/56 for improved balance and decreased fall risk.    Baseline 03/04/21 45/56    Time 4    Period Weeks    Status New    Target Date 04/04/21      PT SHORT TERM GOAL #4   Title Pt will ambulate >300' on level surfaces with LRAD mod I for improved mobility in home and short community distances.    Time 4    Period Weeks    Status New    Target Date 04/04/21             PT Long Term Goals - 03/05/21 0951      PT LONG TERM GOAL #1   Title Pt will be independent with progressive HEP for strength and balance to continue gains on own.    Time 8    Period Weeks    Status New    Target Date 05/04/21      PT LONG TERM GOAL #2   Title Pt will decrease 5 x sit to stand  to <30 sec for improved balance and functional strength.    Baseline 03/04/21 51.87 sec from  chair with hands    Time 8    Period Weeks    Status New    Target Date 05/04/21      PT LONG TERM GOAL #3   Title Pt will increase gait speed from 0.47m/s to >0.79m/s for improved gait safety in the community.    Baseline 03/04/21 0.43m/s with walker     Time 8    Period Weeks    Status New    Target Date 05/04/21      PT LONG TERM GOAL #4   Title Pt will increase Berg to >50/56 for improved balance and decreased fall risk.    Baseline 03/04/21 42/56    Time 8    Period Weeks    Status New    Target Date 05/04/21      PT LONG TERM GOAL #5   Title Pt will ambulate up/down step in reciprocal pattern with 1 rail mod I for improved access to home.    Time 8    Period Weeks    Status New    Target Date 05/04/21                 Plan - 03/25/21 1934    Clinical Impression Statement Pt was able to ambulate outside with cane supervision/CGA. She does at times get off on sequence and feels unsteady pausing her steps. She was challenged with standing on compliant surface with adding in rotational movements but stability overall improving. She reported less issues with bending down.    Personal Factors and Comorbidities Comorbidity 3+    Comorbidities alcohol abuse, astma,anxiety/depression, HTN, holmes-adie syndrome    Examination-Activity Limitations Locomotion Level;Transfers;Stairs;Stand    Examination-Participation Restrictions Community Activity;Occupation;Cleaning    Stability/Clinical Decision Making Evolving/Moderate complexity    Rehab Potential Good    PT Frequency 2x / week   plus eval   PT Duration 8 weeks    PT Treatment/Interventions ADLs/Self Care Home Management;DME Instruction;Gait training;Stair training;Functional mobility training;Therapeutic activities;Therapeutic exercise;Balance training;Neuromuscular re-education;Patient/family education;Vestibular;Visual/perceptual remediation/compensation    PT Next Visit Plan Pt has high anxiety. Gait training with SPC on varied surfaces and around obstacles. Continue balance activities on compliant surface.    Consulted and Agree with Plan of Care Patient           Patient will benefit from skilled therapeutic intervention in order to improve the following deficits  and impairments:  Abnormal gait,Decreased balance,Decreased cognition,Decreased mobility,Decreased strength,Decreased coordination  Visit Diagnosis: Other abnormalities of gait and mobility  Muscle weakness (generalized)  Unsteadiness on feet     Problem List Patient Active Problem List   Diagnosis Date Noted  . Hypomagnesemia 02/10/2021  . Generalized anxiety disorder 02/10/2021  . Insomnia 02/10/2021  . Fall due to seizure (HCC) 02/10/2021  . Head injury 02/10/2021  . Macrocytic anemia 02/10/2021  . 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 Seizure (HCC) 02/09/2021  . Elevated LFTs 02/09/2021  . Substance abuse (HCC) 02/18/2013  . Alcohol dependence (HCC) 02/18/2013  . Headache disorder 02/26/2012  . 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 MDD (major depressive disorder) 12/26/2010  . HTN (hypertension) 12/26/2010  . ALLERGIC RHINITIS 12/26/2010  . Asthma 12/26/2010    Ronn Melena, PT, DPT, NCS 03/25/2021, 7:37 PM   [facility] [address] [place], [place], [address] Phone: [phone]   Fax:  [phone]  Name: Lamiah Marmol MRN: [id number] Date of Birth: 10/05/1966

## 2021-03-27 ENCOUNTER — Ambulatory Visit: Payer: No Typology Code available for payment source

## 2021-03-27 ENCOUNTER — Other Ambulatory Visit: Payer: Self-pay

## 2021-03-27 DIAGNOSIS — R2689 Other abnormalities of gait and mobility: Secondary | ICD-10-CM

## 2021-03-27 DIAGNOSIS — R4701 Aphasia: Secondary | ICD-10-CM

## 2021-03-27 DIAGNOSIS — R2681 Unsteadiness on feet: Secondary | ICD-10-CM

## 2021-03-27 DIAGNOSIS — M6281 Muscle weakness (generalized): Secondary | ICD-10-CM

## 2021-03-27 DIAGNOSIS — R41841 Cognitive communication deficit: Secondary | ICD-10-CM

## 2021-03-27 NOTE — Therapy (Signed)
[facility] [address] [place], [place], [address] Phone: [phone]   Fax:  [phone]  Speech Language Pathology Treatment  Patient Details  Name: Kelly Zuniga MRN: [id number] Date of Birth: [birth year] Referring Provider (SLP): [name], DO   Encounter Date: 03/27/2021   End of Session - 03/27/21 1424    Visit Number 6    Number of Visits 17    Date for SLP Re-Evaluation 06/02/21    SLP Start Time 1230    SLP Stop Time  1315    SLP Time Calculation (min) 45 min    Activity Tolerance Patient tolerated treatment well           Past Medical History:  Diagnosis Date  . Alcohol abuse   . Allergy   . Anxiety   . Asthma   . Asthma    Phreesia 11/09/2020  . Depression    posibly bipolar disorder. Sees Dr [name] for psychiatric care and Dr [name] for therapy  . Depression    Phreesia 11/09/2020  . Headache(784.0)    last migraine 2004  . 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 Hypertension    never on meds   . Seizure (HCC) 02/09/2021  . Substance abuse (HCC)    Phreesia 11/09/2020    Past Surgical History:  Procedure Laterality Date  . benign cyst     removed from left cheek  . TONSILECTOMY, ADENOIDECTOMY, BILATERAL MYRINGOTOMY AND TUBES      There were no vitals filed for this visit.   Subjective Assessment - 03/27/21 1230    Subjective "I'm having a good day"    Currently in Pain? Yes    Pain Location Knee                 ADULT SLP TREATMENT - 03/27/21 1230      General Information   Behavior/Cognition Alert;Cooperative;Pleasant mood      Treatment Provided   Treatment provided Cognitive-Linquistic      Cognitive-Linquistic Treatment   Treatment focused on Aphasia;Cognition;Patient/family/caregiver education    Skilled Treatment Kaiya reported her aunt endorsed improvements in conversation with less halting exhibited. SLP engaged patient in extensive open ended  conversation via conversation starters, with rare halting and rare word finding episodes exhibited x2. With additonal time and use of description strategy, pt able to independently compensate. Pt reports she has "good days and bad days" related to speech, with indication that fatigue may be factor. SLP educated patient on impact of fatigue, stress, and pain on cognitive communication. SLP targeted impact of word finding on return to work at [employer], in which pt endorsed primary concern as physical. 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      Assessment / Recommendations / Plan   Plan Continue with current plan of care      Progression Toward Goals   Progression toward goals Progressing toward goals            SLP Education - 03/27/21 1424    Education Details HEP, functional naming related to work    [employer]) Educated Patient    Methods Explanation;Demonstration    Comprehension Verbalized understanding;Returned demonstration            SLP Short Term Goals - 03/27/21 1425      SLP SHORT TERM GOAL #1   Title pt will report successful usage of a med tracking system between 3 sessions  Baseline 03-25-21 (Alexa timer),    Time 2    Period Weeks   or 9 sessions, for all STGs   Status On-going      SLP SHORT TERM GOAL #2   Title pt will demonstrate WNL verbal expression in 15 minutes mod complex conversation in 4 sessions using compensations PRN    Baseline 03-27-21    Time 2    Period Weeks    Status On-going      SLP SHORT TERM GOAL #3   Title pt will complete cognitive assessment in the first two therapy sessions    Period --   sessions   Status Achieved            SLP Long Term Goals - 03/27/21 1425      SLP LONG TERM GOAL #1   Title pt will demonstrate WNL verbal expression in 15 minutes mod complex-complex conversation in 4 sessions using compensations PRN    Time 6    Period Weeks   or 17 total  sesisons, for all LTGs   Status On-going      SLP LONG TERM GOAL #2   Title pt will indicate a frustration level regarding aonmia which does not debilitate further conversation    Time 6    Period Weeks    Status On-going            Plan - 03/27/21 1426    Clinical Impression Statement Aowyn presents today with mild short term memory deficts, less frequent impulsivity and improved frustration tolerance with anomia. SLP targeted word finding in 15-30 minute open-ended conversation via conversational starters, with rare word finding and rare halting/dysfluency exhibited. Pt able to compensation for anomia x2 without cues. SLP believes pt has some degree of cognitive deficit, although not highlighted on CLQT. SLP believes that pt will benefit from skilled ST targeting cognition and verbal expression.    Speech Therapy Frequency 2x / week    Duration 8 weeks   or 17 total visits   Treatment/Interventions Environmental controls;Functional tasks;Compensatory techniques;SLP instruction and feedback;Multimodal communcation approach;Language facilitation;Cueing hierarchy;Cognitive reorganization;Patient/family education;Internal/external aids    Potential to Achieve Goals Fair    SLP Home Exercise Plan provided    Consulted and Agree with Plan of Care Patient           Patient will benefit from skilled therapeutic intervention in order to improve the following deficits and impairments:   Aphasia  Cognitive communication deficit    Problem List Patient Active Problem List   Diagnosis Date Noted  . Hypomagnesemia 02/10/2021  . Generalized anxiety disorder 02/10/2021  . Insomnia 02/10/2021  . Fall due to seizure (HCC) 02/10/2021  . Head injury 02/10/2021  . Macrocytic anemia 02/10/2021  . 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 Seizure (HCC) 02/09/2021  . Elevated LFTs 02/09/2021  . Substance abuse (HCC) 02/18/2013  . Alcohol dependence (HCC)  02/18/2013  . Headache disorder 02/26/2012  . 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 MDD (major depressive disorder) 12/26/2010  . HTN (hypertension) 12/26/2010  . ALLERGIC RHINITIS 12/26/2010  . Asthma 12/26/2010    Janann Colonel, MA CCC-SLP 03/27/2021, 2:32 PM   [facility] [address] [place], [place], [address] Phone: [phone]   Fax:  [phone]   Name: Kerrilynn Derenzo MRN: [id number] Date of Birth: [birth year]

## 2021-03-27 NOTE — Therapy (Signed)
[facility] [address] [place], [place], [address] Phone: [phone]   Fax:  [phone]  Physical Therapy Treatment  Patient Details  Name: Kelly Payorracey Zuniga MRN: [id number] Date of Birth: Mar 14, 1966 Referring Provider (PT): Referred by Marlin CanaryJessica Vann (hospitalist) but PCP is Janeece Ageeichard Morrow.   Encounter Date: 03/27/2021   PT End of Session - 03/27/21 1146    Visit Number 6    Number of Visits 17    PT Start Time 1147    PT Stop Time 1229    PT Time Calculation (min) 42 min    Equipment Utilized During Treatment Gait belt    Activity Tolerance Patient tolerated treatment well    Behavior During Therapy Anxious           Past Medical History:  Diagnosis Date  . Alcohol abuse   . Allergy   . Anxiety   . Asthma   . Asthma    Phreesia 11/09/2020  . Depression    posibly bipolar disorder. Sees Dr Alanson Alyary Cottle for psychiatric care and Dr Caralyn Guileavid Gutterman for therapy  . Depression    Phreesia 11/09/2020  . Headache(784.0)    last migraine 2004  . 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 Hypertension    never on meds   . Seizure (HCC) 02/09/2021  . Substance abuse (HCC)    Phreesia 11/09/2020    Past Surgical History:  Procedure Laterality Date  . benign cyst     removed from left cheek  . TONSILECTOMY, ADENOIDECTOMY, BILATERAL MYRINGOTOMY AND TUBES      There were no vitals filed for this visit.   Subjective Assessment - 03/27/21 1149    Subjective Patient reports that she has been doing good, has been completing the exercises. Continues to have soreness in the R Knee. No falls to report.    Pertinent History 55 y.o. female with medical history significant of alcohol abuse, astma,anxiety/depression, HTN, holmes-adie syndrome reprts 3-4 days of room spinning dizziness. At work today she had continued dizziness, hand cramping and weakness. She had a brief episode of LOC falling and striking posterior occiput.  Coworkers witness some whole body shaking lasting several seconds. EMS activated and [facility].   CT head w/o acute findings, MRI brain w/o acute findings.    Patient Stated Goals Pt wants to improve her balance and be able to bend over and sit to without feeling like she will fall. Also wants to build up her strength.    Currently in Pain? Yes    Pain Score 8     Pain Location Knee    Pain Orientation Right    Pain Descriptors / Indicators Aching;Sore    Pain Type Acute pain    Pain Onset In the past 7 days    Pain Frequency Intermittent    Aggravating Factors  fall (saturday)    Pain Onset In the past 7 days    Pain Onset In the past 7 days                [facility] - 03/27/21 0001      Transfers   Transfers Sit to Stand;Stand to Sit    Sit to Stand 5: Supervision    Stand to Sit 5: Supervision      Ambulation/Gait   Ambulation/Gait Yes    Ambulation/Gait Assistance 5: Supervision;4: Min guard    Ambulation/Gait Assistance Details Completed ambulation x 350 ft with SPC. With PT  providing verbal cues for upright posture and relaxation throughout ambulation    Ambulation Distance (Feet) 350 Feet    Assistive device Straight cane    Gait Pattern Step-through pattern;Decreased step length - right;Decreased step length - left;Decreased arm swing - left;Narrow base of support    Ambulation Surface Level;Indoor      High Level Balance   High Level Balance Activities Head turns;Negotiating over obstacles    High Level Balance Comments With ambulation: completed  horizontal/vertical x 115 ft each with increased challenge with horizontal > vertical. One-two instances of slight imbalance, requiring CGA from PT. With hurdles at countertop compelted forward step over hurdles without UE support, initially completing step to pattern then progressing to reciprocal pattern x 5 laps down and back. Then progressed to lateral side stepping  over orange hurdles x 3 laps down and back, increased challenge noted when leading with RLE.      Therapeutic Activites    Therapeutic Activities Other Therapeutic Activities    Other Therapeutic Activities standing on blue mat: completed reaching activity to colored cones x 3 reps (4 cones each rep) working toward improved ability to bend/down stand up. Close supervision with completion.            Balance Exercises - 03/27/21 0001      Balance Exercises: Standing   Standing Eyes Opened Narrow base of support (BOS);Head turns;Foam/compliant surface;Limitations    Standing Eyes Opened Limitations completed standing on airex with narrow BOS and eyes open, completed horizontal/vertical head turns x 10 reps each direction. increased challenge with horizontal.    Standing Eyes Closed Narrow base of support (BOS);Wide (BOA);Head turns;Foam/compliant surface;3 reps;30 secs;Limitations    Standing Eyes Closed Limitations completed 3 x 30 seconds iwth narrow BOS and vision removed. then progressed to wide BOS and completd horiz/vertical head turns x 10 reps each direction. increased challenge noted with head turns and vision removed.    Rockerboard Anterior/posterior;EO;Limitations    Rockerboard Limitations standing on rockerboard positioned A/P: completed standing holding board steady with alternating OH Shoulder flexion x 10 reps. intermittent CGA               PT Short Term Goals - 03/05/21 0948      PT SHORT TERM GOAL #1   Title Pt will be independent with initial HEP for strength and balance.    Time 4    Period Weeks    Status New    Target Date 04/04/21      PT SHORT TERM GOAL #2   Title Pt will decrease  5 x sit to stand from 51.87 sec to <40 sec for improved balance and functional strength.    Baseline 03/04/21 51.87 sec from chair without hands    Time 4    Period Weeks    Status New    Target Date 04/04/21      PT SHORT TERM GOAL #3   Title Pt will increase Berg from  42 to >45/56 for improved balance and decreased fall risk.    Baseline 03/04/21 45/56    Time 4    Period Weeks    Status New    Target Date 04/04/21      PT SHORT TERM GOAL #4   Title Pt will ambulate >300' on level surfaces with LRAD mod I for improved mobility in home and short community distances.    Time 4    Period Weeks    Status New    Target Date 04/04/21  PT Long Term Goals - 03/05/21 0951      PT LONG TERM GOAL #1   Title Pt will be independent with progressive HEP for strength and balance to continue gains on own.    Time 8    Period Weeks    Status New    Target Date 05/04/21      PT LONG TERM GOAL #2   Title Pt will decrease 5 x sit to stand  to <30 sec for improved balance and functional strength.    Baseline 03/04/21 51.87 sec from chair with hands    Time 8    Period Weeks    Status New    Target Date 05/04/21      PT LONG TERM GOAL #3   Title Pt will increase gait speed from 0.35m/s to >0.65m/s for improved gait safety in the community.    Baseline 03/04/21 0.33m/s with walker    Time 8    Period Weeks    Status New    Target Date 05/04/21      PT LONG TERM GOAL #4   Title Pt will increase Berg to >50/56 for improved balance and decreased fall risk.    Baseline 03/04/21 42/56    Time 8    Period Weeks    Status New    Target Date 05/04/21      PT LONG TERM GOAL #5   Title Pt will ambulate up/down step in reciprocal pattern with 1 rail mod I for improved access to home.    Time 8    Period Weeks    Status New    Target Date 05/04/21                 Plan - 03/27/21 1324    Clinical Impression Statement Patient continued to demonstrate increased challenge with horizontal head turns, and vision removed on complaint surfaces. Patient also require intermittent CGA with negotiating over obstacles. Will continue to progress toward all LTGs.    Personal Factors and Comorbidities Comorbidity 3+    Comorbidities alcohol abuse,  astma,anxiety/depression, HTN, holmes-adie syndrome    Examination-Activity Limitations Locomotion Level;Transfers;Stairs;Stand    Examination-Participation Restrictions Community Activity;Occupation;Cleaning    Stability/Clinical Decision Making Evolving/Moderate complexity    Rehab Potential Good    PT Frequency 2x / week   plus eval   PT Duration 8 weeks    PT Treatment/Interventions ADLs/Self Care Home Management;DME Instruction;Gait training;Stair training;Functional mobility training;Therapeutic activities;Therapeutic exercise;Balance training;Neuromuscular re-education;Patient/family education;Vestibular;Visual/perceptual remediation/compensation    PT Next Visit Plan STG due next week. Pt has high anxiety. Gait training with SPC on varied surfaces and around obstacles. Continue balance activities on compliant surface. negotiating over obstacles.    Consulted and Agree with Plan of Care Patient           Patient will benefit from skilled therapeutic intervention in order to improve the following deficits and impairments:  Abnormal gait,Decreased balance,Decreased cognition,Decreased mobility,Decreased strength,Decreased coordination  Visit Diagnosis: Other abnormalities of gait and mobility  Muscle weakness (generalized)  Unsteadiness on feet     Problem List Patient Active Problem List   Diagnosis Date Noted  . Hypomagnesemia 02/10/2021  . Generalized anxiety disorder 02/10/2021  . Insomnia 02/10/2021  . Fall due to seizure (HCC) 02/10/2021  . Head injury 02/10/2021  . Macrocytic anemia 02/10/2021  . 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 Seizure (HCC) 02/09/2021  . Elevated LFTs 02/09/2021  . Substance abuse (HCC) 02/18/2013  .  Alcohol dependence (HCC) 02/18/2013  . Headache disorder 02/26/2012  . 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 MDD (major depressive disorder) 12/26/2010  . HTN (hypertension) 12/26/2010  . ALLERGIC  RHINITIS 12/26/2010  . Asthma 12/26/2010    Tempie Donning, PT, DPT 03/27/2021, 1:27 PM  [facility] [address] [place], [place], [address] Phone: [phone]   Fax:  [phone]  Name: Kelly Zuniga MRN: [id number] Date of Birth: 02-28-1966

## 2021-04-01 ENCOUNTER — Ambulatory Visit: Payer: No Typology Code available for payment source

## 2021-04-01 ENCOUNTER — Other Ambulatory Visit: Payer: Self-pay

## 2021-04-01 VITALS — BP 148/82

## 2021-04-01 DIAGNOSIS — R2689 Other abnormalities of gait and mobility: Secondary | ICD-10-CM | POA: Diagnosis not present

## 2021-04-01 DIAGNOSIS — R2681 Unsteadiness on feet: Secondary | ICD-10-CM

## 2021-04-01 DIAGNOSIS — R4701 Aphasia: Secondary | ICD-10-CM

## 2021-04-01 DIAGNOSIS — R41841 Cognitive communication deficit: Secondary | ICD-10-CM

## 2021-04-01 NOTE — Therapy (Signed)
[facility] [address] [place], [place], [address] Phone: [phone]   Fax:  [phone]  Speech Language Pathology Treatment  Patient Details  Name: Kelly Zuniga MRN: [id number] Date of Birth: Sep 11, 1966 Referring Provider (SLP): [name], DO   Encounter Date: 04/01/2021   End of Session - 04/01/21 1101    Visit Number 7    Number of Visits 17    Date for SLP Re-Evaluation 06/02/21    SLP Start Time 1100    SLP Stop Time  1145    SLP Time Calculation (min) 45 min    Activity Tolerance Patient tolerated treatment well           Past Medical History:  Diagnosis Date  . Alcohol abuse   . Allergy   . Anxiety   . Asthma   . Asthma    Phreesia 11/09/2020  . Depression    posibly bipolar disorder. Sees Dr [name] for psychiatric care and Dr [name] for therapy  . Depression    Phreesia 11/09/2020  . Headache(784.0)    last migraine 2004  . 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 Hypertension    never on meds   . Seizure (HCC) 02/09/2021  . Substance abuse (HCC)    Phreesia 11/09/2020    Past Surgical History:  Procedure Laterality Date  . benign cyst     removed from left cheek  . TONSILECTOMY, ADENOIDECTOMY, BILATERAL MYRINGOTOMY AND TUBES      There were no vitals filed for this visit.   Subjective Assessment - 04/01/21 1103    Subjective "it's a bad speech day" with increased halting    Currently in Pain? No/denies                 ADULT SLP TREATMENT - 04/01/21 1102      General Information   Behavior/Cognition Alert;Cooperative;Pleasant mood      Treatment Provided   Treatment provided Cognitive-Linquistic      Cognitive-Linquistic Treatment   Treatment focused on Aphasia;Cognition;Patient/family/caregiver education    Skilled Treatment Pt presented with increased halted speech this session. SLP engaged patient in short paragraph reading task and automatic speech  tasks to aid flow of speech, which was effective. Halting improved from x9 occurances to x1 occurance as pt progressed throughout paragraph reading tasks. SLP cued intermittent abdominal breathing to reduce tension and ease frustration. SLP trialed automatic speech tasks, with intermittent halting exhibited. Halting in conversation reduced throughout session after structured tasks. Rare word finding exhibited this session.      Assessment / Recommendations / Plan   Plan Continue with current plan of care      Progression Toward Goals   Progression toward goals Progressing toward goals            SLP Education - 04/01/21 1414    Education Details reading to aid flow of speech, automatic speech tasks, abdominal breathing    Person(s) Educated Patient    Methods Explanation;Demonstration;Verbal cues    Comprehension Verbalized understanding;Returned demonstration;Need further instruction            SLP Short Term Goals - 04/01/21 1102      SLP SHORT TERM GOAL #1   Title pt will report successful usage of a med tracking system between 3 sessions    Baseline 03-25-21 (Alexa timer),    Time 1    Period Weeks   or 9 sessions, for all STGs   Status On-going  SLP SHORT TERM GOAL #2   Title pt will demonstrate WNL verbal expression in 15 minutes mod complex conversation in 4 sessions using compensations PRN    Baseline 03-27-21    Time 1    Period Weeks    Status On-going      SLP SHORT TERM GOAL #3   Title pt will complete cognitive assessment in the first two therapy sessions    Period --   sessions   Status Achieved            SLP Long Term Goals - 04/01/21 1102      SLP LONG TERM GOAL #1   Title pt will demonstrate WNL verbal expression in 15 minutes mod complex-complex conversation in 4 sessions using compensations PRN    Time 5    Period Weeks   or 17 total sesisons, for all LTGs   Status On-going      SLP LONG TERM GOAL #2   Title pt will indicate a frustration  level regarding aonmia which does not debilitate further conversation    Time 5    Period Weeks    Status On-going            Plan - 04/01/21 1415    Clinical Impression Statement Kelly Zuniga presents today with increased halting and hesitations in opening conversations. SLP introduced reading simple passages or saying automatic speech sequences in improve fluidity of speech, decrease halting, and reduce frusration, which was effective. Overall improved frustration tolerance with anomia and dysfluent speech exhibited, in which pt stated "I breath my way through it". SLP believes pt has still some degree of cognitive deficit, although not highlighted on CLQT. SLP believes that pt will benefit from skilled ST targeting cognition and verbal expression to improve QOL.    Speech Therapy Frequency 2x / week    Duration 8 weeks   or 17 total visits   Treatment/Interventions Environmental controls;Functional tasks;Compensatory techniques;SLP instruction and feedback;Multimodal communcation approach;Language facilitation;Cueing hierarchy;Cognitive reorganization;Patient/family education;Internal/external aids    Potential to Achieve Goals Good    SLP Home Exercise Plan provided    Consulted and Agree with Plan of Care Patient           Patient will benefit from skilled therapeutic intervention in order to improve the following deficits and impairments:   Aphasia  Cognitive communication deficit    Problem List Patient Active Problem List   Diagnosis Date Noted  . Hypomagnesemia 02/10/2021  . Generalized anxiety disorder 02/10/2021  . Insomnia 02/10/2021  . Fall due to seizure (HCC) 02/10/2021  . Head injury 02/10/2021  . Macrocytic anemia 02/10/2021  . 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 Seizure (HCC) 02/09/2021  . Elevated LFTs 02/09/2021  . Substance abuse (HCC) 02/18/2013  . Alcohol dependence (HCC) 02/18/2013  . Headache disorder 02/26/2012   . 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 MDD (major depressive disorder) 12/26/2010  . HTN (hypertension) 12/26/2010  . ALLERGIC RHINITIS 12/26/2010  . Asthma 12/26/2010    Janann Colonel, MA CCC-SLP 04/01/2021, 2:19 PM  [facility] [address] [place], [place], [address] Phone: [phone]   Fax:  [phone]   Name: Kelly Zuniga MRN: [id number] Date of Birth: 1966-06-14

## 2021-04-01 NOTE — Therapy (Signed)
[facility] [address] [place], [name], [address] Phone: [phone]   Fax:  [phone]  Physical Therapy Treatment  Patient Details  Name: Kelly Zuniga MRN: [id number] Date of Birth: 12/20/1966 Referring Provider (PT): Referred by Eulogio Bear (hospitalist) but PCP is Maximiano Coss.   Encounter Date: 04/01/2021   PT End of Session - 04/01/21 1022    Visit Number 7    Number of Visits 17    PT Start Time 1020    PT Stop Time 1059    PT Time Calculation (min) 39 min    Equipment Utilized During Treatment Gait belt    Activity Tolerance Patient tolerated treatment well    Behavior During Therapy Anxious           Past Medical History:  Diagnosis Date  . Alcohol abuse   . Allergy   . Anxiety   . Asthma   . Asthma    Phreesia 11/09/2020  . Depression    posibly bipolar disorder. Sees Dr Cristy Friedlander for psychiatric care and Dr Apolonio Schneiders for therapy  . Depression    Phreesia 11/09/2020  . Headache(784.0)    last migraine 2004  . 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 Hypertension    never on meds   . Seizure ([place]) 02/09/2021  . Substance abuse ([place])    Phreesia 11/09/2020    Past Surgical History:  Procedure Laterality Date  . benign cyst     removed from left cheek  . TONSILECTOMY, ADENOIDECTOMY, BILATERAL MYRINGOTOMY AND TUBES      Vitals:   04/01/21 1023  BP: (!) 148/82     Subjective Assessment - 04/01/21 1022    Subjective Patient reports doing ok today. Pt was very unsteady yesterday so did not do much. She used walker some when had to get up. She said she just woke up that way. She tried to rest and drink a lot of water.    Pertinent History 55 y.o. female with medical history significant of alcohol abuse, astma,anxiety/depression, HTN, holmes-adie syndrome reprts 3-4 days of room spinning dizziness. At work today she had continued dizziness, hand cramping and weakness. She had a brief  episode of LOC falling and striking posterior occiput. Coworkers witness some whole body shaking lasting several seconds. EMS activated and patient transported to [facility] for evaluation.   CT head w/o acute findings, MRI brain w/o acute findings.    Patient Stated Goals Pt wants to improve her balance and be able to bend over and sit to without feeling like she will fall. Also wants to build up her strength.    Currently in Pain? No/denies    Pain Onset In the past 7 days    Pain Onset In the past 7 days    Pain Onset In the past 7 days                             [facility] Adult PT Treatment/Exercise - 04/01/21 1026      Transfers   Transfers Sit to Stand;Stand to Sit    Sit to Stand 5: Supervision    Stand to Sit 5: Supervision      Ambulation/Gait   Ambulation/Gait Yes    Ambulation/Gait Assistance 5: Supervision    Ambulation/Gait Assistance Details Pt does lean to right some at times. Verbal cues to relax left arm and focus on increased step length.    Ambulation Distance (Feet)  850 Feet    Assistive device Straight cane   quad tip   Gait Pattern Step-through pattern;Decreased arm swing - left;Narrow base of support    Ambulation Surface Level;Unlevel;Indoor;Outdoor;Paved;Grass    Curb 5: Supervision    Curb Details (indicate cue type and reason) x 3 outside with cane with last rep trying to perform walking up to curb without pausing.      Standardized Balance Assessment   Standardized Balance Assessment Berg Balance Test      Berg Balance Test   Sit to Stand Able to stand without using hands and stabilize independently    Standing Unsupported Able to stand safely 2 minutes    Sitting with Back Unsupported but Feet Supported on Floor or Stool Able to sit safely and securely 2 minutes    Stand to Sit Sits safely with minimal use of hands    Transfers Able to transfer safely, minor use of hands    Standing Unsupported with Eyes Closed Able to stand 10 seconds with  supervision    Standing Ubsupported with Feet Together Able to place feet together independently and stand for 1 minute with supervision    From Standing, Reach Forward with Outstretched Arm Can reach forward >12 cm safely (5")    From Standing Position, Pick up Object from [place] to pick up shoe safely and easily    From Standing Position, Turn to Look Behind Over each Shoulder Needs supervision when turning    Turn 360 Degrees Able to turn 360 degrees safely but slowly    Standing Unsupported, Alternately Place Feet on Step/Stool Able to stand independently and safely and complete 8 steps in 20 seconds    Standing Unsupported, One Foot in Front Able to take small step independently and hold 30 seconds    Standing on One Leg Tries to lift leg/unable to hold 3 seconds but remains standing independently    Total Score 43      Neuro Re-ed    Neuro Re-ed Details  Standing in staggered stance x 30 sec each position then staggered stance with head turns x 10 horizontal. Pt more steady with LLE in front.                  PT Education - 04/01/21 1219    Education Details Discussed result of Berg.    Person(s) Educated Patient    Methods Explanation    Comprehension Verbalized understanding            PT Short Term Goals - 04/01/21 1219      PT SHORT TERM GOAL #1   Title Pt will be independent with initial HEP for strength and balance.    Time 4    Period Weeks    Status New    Target Date 04/04/21      PT SHORT TERM GOAL #2   Title Pt will decrease  5 x sit to stand from 51.87 sec to <40 sec for improved balance and functional strength.    Baseline 03/04/21 51.87 sec from chair without hands    Time 4    Period Weeks    Status New    Target Date 04/04/21      PT SHORT TERM GOAL #3   Title Pt will increase Berg from 42 to >45/56 for improved balance and decreased fall risk.    Baseline 03/04/21 45/56. 04/01/21 43/56    Time 4    Period Weeks    Status Not Met  Target Date 04/04/21      PT SHORT TERM GOAL #4   Title Pt will ambulate >300' on level surfaces with LRAD mod I for improved mobility in home and short community distances.    Time 4    Period Weeks    Status New    Target Date 04/04/21             PT Long Term Goals - 03/05/21 0951      PT LONG TERM GOAL #1   Title Pt will be independent with progressive HEP for strength and balance to continue gains on own.    Time 8    Period Weeks    Status New    Target Date 05/04/21      PT LONG TERM GOAL #2   Title Pt will decrease 5 x sit to stand  to <30 sec for improved balance and functional strength.    Baseline 03/04/21 51.87 sec from chair with hands    Time 8    Period Weeks    Status New    Target Date 05/04/21      PT LONG TERM GOAL #3   Title Pt will increase gait speed from 0.41ms to >0.820m for improved gait safety in the community.    Baseline 03/04/21 0.6722mwith walker    Time 8    Period Weeks    Status New    Target Date 05/04/21      PT LONG TERM GOAL #4   Title Pt will increase Berg to >50/56 for improved balance and decreased fall risk.    Baseline 03/04/21 42/56    Time 8    Period Weeks    Status New    Target Date 05/04/21      PT LONG TERM GOAL #5   Title Pt will ambulate up/down step in reciprocal pattern with 1 rail mod I for improved access to home.    Time 8    Period Weeks    Status New    Target Date 05/04/21                 Plan - 04/01/21 1220    Clinical Impression Statement PT started checking STGs with BerMerrilee Janskyday. Pt increased 1 point to 43/56 still indicating high fall risk and short of goal. She is still challenged with narrow BOS actvities. Showing improving step length with gait with cane but at times does lean to the right.    Personal Factors and Comorbidities Comorbidity 3+    Comorbidities alcohol abuse, astma,anxiety/depression, HTN, holmes-adie syndrome    Examination-Activity Limitations Locomotion  Level;Transfers;Stairs;Stand    Examination-Participation Restrictions Community Activity;Occupation;Cleaning    Stability/Clinical Decision Making Evolving/Moderate complexity    Rehab Potential Good    PT Frequency 2x / week   plus eval   PT Duration 8 weeks    PT Treatment/Interventions ADLs/Self Care Home Management;DME Instruction;Gait training;Stair training;Functional mobility training;Therapeutic activities;Therapeutic exercise;Balance training;Neuromuscular re-education;Patient/family education;Vestibular;Visual/perceptual remediation/compensation    PT Next Visit Plan Finish checking remaining STGs.  Pt has high anxiety. Gait training with [place] varied surfaces and around obstacles. Continue balance activities on compliant surface, narrow BOS. negotiating over obstacles.    Consulted and Agree with Plan of Care Patient           Patient will benefit from skilled therapeutic intervention in order to improve the following deficits and impairments:  Abnormal gait,Decreased balance,Decreased cognition,Decreased mobility,Decreased strength,Decreased coordination  Visit Diagnosis: Other abnormalities of gait and  mobility  Unsteadiness on feet     Problem List Patient Active Problem List   Diagnosis Date Noted  . Hypomagnesemia 02/10/2021  . Generalized anxiety disorder 02/10/2021  . Insomnia 02/10/2021  . Fall due to seizure ([place]) 02/10/2021  . Head injury 02/10/2021  . Macrocytic anemia 02/10/2021  . Seizure as late effect of cerebrovascular accident (CVA) ([place]) 02/10/2021  . Seizure ([place]) 02/09/2021  . Elevated LFTs 02/09/2021  . Substance abuse ([place]) 02/18/2013  . Alcohol dependence ([place]) 02/18/2013  . Headache disorder 02/26/2012  . 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 MDD (major depressive disorder) 12/26/2010  . HTN (hypertension) 12/26/2010  . ALLERGIC RHINITIS 12/26/2010  . Asthma 12/26/2010    Electa Sniff, PT, DPT, NCS 04/01/2021, 12:23 PM  [facility] [address] [place], [name], [address] Phone: [phone]   Fax:  [phone]  Name: Kelly Zuniga MRN: [id number] Date of Birth: Jun 23, 1966

## 2021-04-03 ENCOUNTER — Ambulatory Visit: Payer: No Typology Code available for payment source

## 2021-04-03 ENCOUNTER — Other Ambulatory Visit: Payer: Self-pay

## 2021-04-03 DIAGNOSIS — R2689 Other abnormalities of gait and mobility: Secondary | ICD-10-CM

## 2021-04-03 DIAGNOSIS — R2681 Unsteadiness on feet: Secondary | ICD-10-CM

## 2021-04-03 DIAGNOSIS — R4701 Aphasia: Secondary | ICD-10-CM

## 2021-04-03 DIAGNOSIS — R41841 Cognitive communication deficit: Secondary | ICD-10-CM

## 2021-04-03 NOTE — Patient Instructions (Addendum)
Exercises Sit to Stand - 2 x daily - 7 x weekly - 2 sets - 5 reps Side Stepping with Counter Support - 2 x daily - 7 x weekly - 1 sets - 3 reps Walking March - 2 x daily - 7 x weekly - 1 sets - 3 reps Backward Walking with Counter Support - 2 x daily - 7 x weekly - 1 sets - 3 reps Romberg Stance Eyes Closed on Foam Pad - 2 x daily - 7 x weekly - 1 sets - 3 reps - 20 sec hold Standing head turns - 2 x daily - 7

## 2021-04-03 NOTE — Therapy (Signed)
[facility] [address] [place], [name], [address] Phone: [phone]   Fax:  [phone]  Physical Therapy Treatment  Patient Details  Name: Kelly Zuniga MRN: [id number] Date of Birth: 01/09/1966 Referring Provider (PT): Referred by Eulogio Bear (hospitalist) but PCP is Maximiano Coss.   Encounter Date: 04/03/2021   PT End of Session - 04/03/21 1104    Visit Number 8    Number of Visits 17    PT Start Time 0355    PT Stop Time 1143    PT Time Calculation (min) 40 min    Equipment Utilized During Treatment Gait belt    Activity Tolerance Patient tolerated treatment well    Behavior During Therapy Anxious           Past Medical History:  Diagnosis Date  . Alcohol abuse   . Allergy   . Anxiety   . Asthma   . Asthma    Phreesia 11/09/2020  . Depression    posibly bipolar disorder. Sees Dr Cristy Friedlander for psychiatric care and Dr Apolonio Schneiders for therapy  . Depression    Phreesia 11/09/2020  . Headache(784.0)    last migraine 2004  . 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 Hypertension    never on meds   . Seizure ([place]) 02/09/2021  . Substance abuse ([place])    Phreesia 11/09/2020    Past Surgical History:  Procedure Laterality Date  . benign cyst     removed from left cheek  . TONSILECTOMY, ADENOIDECTOMY, BILATERAL MYRINGOTOMY AND TUBES      There were no vitals filed for this visit.   Subjective Assessment - 04/03/21 1104    Subjective Patient reports she is having a great day today both physically and speechwise.    Pertinent History 55 y.o. female with medical history significant of alcohol abuse, astma,anxiety/depression, HTN, holmes-adie syndrome reprts 3-4 days of room spinning dizziness. At work today she had continued dizziness, hand cramping and weakness. She had a brief episode of LOC falling and striking posterior occiput. Coworkers witness some whole body shaking lasting several seconds. EMS  activated and patient transported to [facility] for evaluation.   CT head w/o acute findings, MRI brain w/o acute findings.    Patient Stated Goals Pt wants to improve her balance and be able to bend over and sit to without feeling like she will fall. Also wants to build up her strength.    Currently in Pain? No/denies    Pain Onset In the past 7 days    Pain Onset In the past 7 days    Pain Onset In the past 7 days                             [facility] Adult PT Treatment/Exercise - 04/03/21 1105      Transfers   Transfers Sit to Stand;Stand to Sit    Sit to Stand 7: Independent    Five time sit to stand comments  13.15 sec from chair without hands    Stand to Sit 6: Modified independent (Device/Increase time)      Ambulation/Gait   Ambulation/Gait Yes    Ambulation/Gait Assistance 6: Modified independent (Device/Increase time);5: Supervision    Ambulation/Gait Assistance Details Pt ambulated with cane first with increased step length and better job relaxing left arm. Switched to no AD and supervision for safety with wider BOS and less trunk rotation and arm swing.  Ambulation Distance (Feet) 345 Feet   115' without AD   Assistive device Straight cane;None   with quad tip   Gait Pattern Step-through pattern;Decreased step length - right;Decreased step length - left;Decreased arm swing - right;Decreased arm swing - left;Wide base of support    Ambulation Surface Level;Indoor      Neuro Re-ed    Neuro Re-ed Details  Reviewed HEP from [place] as noted below. Standing on airex: feet apart x 30 sec eyes closed then with eyes open with head turns left/right and up/down x 10 each. Then feet together x 30 sec eyes open, 30 sec eyes closed with increased sway, then eyes open with head turns left/right and up/down x 10 each. Marching on airex x 20 with CGA with verbal cues to control steps. Dynamic gait activities with cane 115' x 2 with walking over 3 reciprocal 2x4" bolsters, weaving  in and out of 4 cones and over blue mat then repeated course 115' x 3 without AD CGA. Gait without AD: 34' with speed changes on command, 115 with stop/go on command, 115' with head turns on command, 45' with manual pertubations with CGA.             Exercises Sit to Stand - 2 x daily - 7 x weekly - 2 sets - 5 reps Side Stepping with Counter Support - 2 x daily - 7 x weekly - 1 sets - 3 reps Walking March - 2 x daily - 7 x weekly - 1 sets - 3 reps Backward Walking with Counter Support - 2 x daily - 7 x weekly - 1 sets - 3 reps Romberg Stance Eyes Closed on Foam Pad - 2 x daily - 7 x weekly - 1 sets - 3 reps - 20 sec hold Standing head turns - 2 x daily - 7      PT Education - 04/03/21 1423    Education Details Discussed results of testing and progress thus far. Pt still to use cane whenever leaves home and also inside if feeling less steady.    Person(s) Educated Patient    Methods Explanation    Comprehension Verbalized understanding            PT Short Term Goals - 04/03/21 1113      PT SHORT TERM GOAL #1   Title Pt will be independent with initial HEP for strength and balance.    Time 4    Period Weeks    Status Achieved    Target Date 04/04/21      PT SHORT TERM GOAL #2   Title Pt will decrease  5 x sit to stand from 51.87 sec to <40 sec for improved balance and functional strength.    Baseline 03/04/21 51.87 sec from chair without hands, 04/03/21 13.15 sec from chair without hands    Time 4    Period Weeks    Status Achieved    Target Date 04/04/21      PT SHORT TERM GOAL #3   Title Pt will increase Berg from 42 to >45/56 for improved balance and decreased fall risk.    Baseline 03/04/21 45/56. 04/01/21 43/56    Time 4    Period Weeks    Status Not Met    Target Date 04/04/21      PT SHORT TERM GOAL #4   Title Pt will ambulate >300' on level surfaces with LRAD mod I for improved mobility in home and short community distances.  Baseline 345' with cane mod I     Time 4    Period Weeks    Status Achieved    Target Date 04/04/21             PT Long Term Goals - 04/03/21 1114      PT LONG TERM GOAL #1   Title Pt will be independent with progressive HEP for strength and balance to continue gains on own.    Time 8    Period Weeks    Status New      PT LONG TERM GOAL #2   Title Pt will decrease 5 x sit to stand  to <30 sec for improved balance and functional strength.    Baseline 03/04/21 51.87 sec from chair with hands. 04/03/21 13.15 sec    Time 8    Period Weeks    Status Achieved      PT LONG TERM GOAL #3   Title Pt will increase gait speed from 0.63ms to >0.856m for improved gait safety in the community.    Baseline 03/04/21 0.6719mwith walker    Time 8    Period Weeks    Status New      PT LONG TERM GOAL #4   Title Pt will increase Berg to >50/56 for improved balance and decreased fall risk.    Baseline 03/04/21 42/56    Time 8    Period Weeks    Status New      PT LONG TERM GOAL #5   Title Pt will ambulate up/down step in reciprocal pattern with 1 rail mod I for improved access to home.    Time 8    Period Weeks    Status New                 Plan - 04/03/21 1424    Clinical Impression Statement Pt making good progress towards goals meeting 3/4 STGs. She is decreased her 5 x sit to stand time significantly to 13.15 sec from 51 sec meeting LTG as well indicating improving balance. She is able to walk on level surfaces with cane mod I. Independent with initial HEP. Pt still challenged with more narrow BOS activities and on compliant surface. Started gait training without AD and pt improved as went on once she relaxed more. Pt continues to benefit from skilled PT to continue to progress her gait and balance.    Personal Factors and Comorbidities Comorbidity 3+    Comorbidities alcohol abuse, astma,anxiety/depression, HTN, holmes-adie syndrome    Examination-Activity Limitations Locomotion Level;Transfers;Stairs;Stand     Examination-Participation Restrictions Community Activity;Occupation;Cleaning    Stability/Clinical Decision Making Evolving/Moderate complexity    Rehab Potential Good    PT Frequency 2x / week   plus eval   PT Duration 8 weeks    PT Treatment/Interventions ADLs/Self Care Home Management;DME Instruction;Gait training;Stair training;Functional mobility training;Therapeutic activities;Therapeutic exercise;Balance training;Neuromuscular re-education;Patient/family education;Vestibular;Visual/perceptual remediation/compensation    PT Next Visit Plan Pt has high anxiety. Continue to progress gait training without AD more. I cleared just to go without around home currently as long as she is feeling good. Continue balance activities on compliant surface, narrow BOS. negotiating over obstacles.    Consulted and Agree with Plan of Care Patient           Patient will benefit from skilled therapeutic intervention in order to improve the following deficits and impairments:  Abnormal gait,Decreased balance,Decreased cognition,Decreased mobility,Decreased strength,Decreased coordination  Visit Diagnosis: Other abnormalities of gait and mobility  Unsteadiness on feet     Problem List Patient Active Problem List   Diagnosis Date Noted  . Hypomagnesemia 02/10/2021  . Generalized anxiety disorder 02/10/2021  . Insomnia 02/10/2021  . Fall due to seizure ([place]) 02/10/2021  . Head injury 02/10/2021  . Macrocytic anemia 02/10/2021  . Seizure as late effect of cerebrovascular accident (CVA) ([place]) 02/10/2021  . Seizure ([place]) 02/09/2021  . Elevated LFTs 02/09/2021  . Substance abuse ([place]) 02/18/2013  . Alcohol dependence ([place]) 02/18/2013  . Headache disorder 02/26/2012  . 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 MDD (major depressive disorder) 12/26/2010  . HTN (hypertension) 12/26/2010  . ALLERGIC RHINITIS 12/26/2010  . Asthma 12/26/2010    Electa Sniff, PT, DPT, NCS 04/03/2021, 2:28 PM  [facility] [address] [place], [name], [address] Phone: [phone]   Fax:  [phone]  Name: Kelly Zuniga MRN: [id number] Date of Birth: [birth year]

## 2021-04-03 NOTE — Therapy (Signed)
[facility] [address] [place], [name], [address] Phone: [phone]   Fax:  [phone]  Speech Language Pathology Treatment  Patient Details  Name: Kelly Zuniga MRN: [id number] Date of Birth: 01-Sep-1966 Referring Provider (SLP): Eulogio Bear, DO   Encounter Date: 04/03/2021   End of Session - 04/03/21 1227    Visit Number 8    Number of Visits 17    Date for SLP Re-Evaluation 06/02/21    SLP Start Time 1018    SLP Stop Time  1100    SLP Time Calculation (min) 42 min    Activity Tolerance Patient tolerated treatment well           Past Medical History:  Diagnosis Date  . Alcohol abuse   . Allergy   . Anxiety   . Asthma   . Asthma    Phreesia 11/09/2020  . Depression    posibly bipolar disorder. Sees Dr Cristy Friedlander for psychiatric care and Dr Apolonio Schneiders for therapy  . Depression    Phreesia 11/09/2020  . Headache(784.0)    last migraine 2004  . 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 Hypertension    never on meds   . Seizure ([place]) 02/09/2021  . Substance abuse ([place])    Phreesia 11/09/2020    Past Surgical History:  Procedure Laterality Date  . benign cyst     removed from left cheek  . TONSILECTOMY, ADENOIDECTOMY, BILATERAL MYRINGOTOMY AND TUBES      There were no vitals filed for this visit.   Subjective Assessment - 04/03/21 1217    Subjective "It's a good speech day!"    Currently in Pain? No/denies                 ADULT SLP TREATMENT - 04/03/21 1028      General Information   Behavior/Cognition Alert;Cooperative;Pleasant mood      Treatment Provided   Treatment provided Cognitive-Linquistic      Cognitive-Linquistic Treatment   Treatment focused on Cognition;Aphasia;Patient/family/caregiver education    Skilled Treatment SLP inquired to pt what was causing her "good speech day" today and pt replied, "Less stress, lots of talking, lots of word usage," [name] reports the  thing causing less stress is "The kids are getting ready to leave town for the next 10 days" and that she will have less responsibilities around the house. SLP worked with pt with abdominal breathing (AB) and 4-4-4 breathing (inhale 4 seconds, hold 4 seconds, exhale 4 seconds) - SLP guided pt through this x4 cycles and pt stated that her AB was more relaxing for her than 4-4-4. SLP told pt to cont with 4-4-4. SLP then engaged pt with mod complex conversation for 20 minutes without any overt s/sx speech or language defiicts. SLP explained to pt that it appears that her neuro symptoms have resolved somewhat since the initiation of therapy and that any psychological ("external") factors that were playing a role in her speech and language sx have now taken the forefront. SLP postulates this is the reason for her having such fluctuation in her sx from day to day. Pt acknowledged understanding      Assessment / Recommendations / Plan   Plan Continue with current plan of care      Progression Toward Goals   Progression toward goals Progressing toward goals            SLP Education - 04/03/21 1226    Education Details see "skilled intervention" for details  Person(s) Educated Patient    Methods Explanation    Comprehension Verbalized understanding            SLP Short Term Goals - 04/03/21 1228      SLP SHORT TERM GOAL #1   Title pt will report successful usage of a med tracking system between 3 sessions    Baseline 03-25-21 (Alexa timer),    Period --   or 9 sessions, for all STGs   Status Partially Met      SLP SHORT TERM GOAL #2   Title pt will demonstrate WNL verbal expression in 15 minutes mod complex conversation in 4 sessions using compensations PRN    Baseline 03-27-21    Status Partially Met      SLP SHORT TERM GOAL #3   Title pt will complete cognitive assessment in the first two therapy sessions    Period --   sessions   Status Achieved            SLP Long Term Goals -  04/03/21 1229      SLP LONG TERM GOAL #1   Title pt will demonstrate WNL verbal expression in 15 minutes mod complex-complex conversation in 4 sessions using compensations PRN    Baseline 04-03-21    Time 5    Period Weeks   or 17 total sesisons, for all LTGs   Status On-going      SLP LONG TERM GOAL #2   Title pt will indicate a frustration level regarding aonmia which does not debilitate further conversation    Baseline 04-03-21    Time 5    Period Weeks    Status On-going            Plan - 04/03/21 1034    Clinical Impression Statement [name] presents today with speech and language WNL - no dysluency nor anomia observed during session. SLP reviewed the alliterative sentences and the need for (pt-stated) compesation of overarticulation, abdominal breathing, and focus on the consonant. SLP introduced 4-4-4 breathing (vagal-type breathing) where SLP suggested inhale for 4 seconds, hold 4 seconds, and then exhale/relax for 4 seconds. SLP believes pt has still some degree of cognitive deficit, although not highlighted on CLQT. SLP believes that pt will benefit from skilled ST targeting cognition and verbal expression to improve QOL.    Speech Therapy Frequency 2x / week    Duration 8 weeks   or 17 total visits   Treatment/Interventions Environmental controls;Functional tasks;Compensatory techniques;SLP instruction and feedback;Multimodal communcation approach;Language facilitation;Cueing hierarchy;Cognitive reorganization;Patient/family education;Internal/external aids    Potential to Achieve Goals Good    SLP Home Exercise Plan provided    Consulted and Agree with Plan of Care Patient           Patient will benefit from skilled therapeutic intervention in order to improve the following deficits and impairments:   Aphasia  Cognitive communication deficit    Problem List Patient Active Problem List   Diagnosis Date Noted  . Hypomagnesemia 02/10/2021  . Generalized anxiety  disorder 02/10/2021  . Insomnia 02/10/2021  . Fall due to seizure ([place]) 02/10/2021  . Head injury 02/10/2021  . Macrocytic anemia 02/10/2021  . Seizure as late effect of cerebrovascular accident (CVA) ([place]) 02/10/2021  . Seizure ([place]) 02/09/2021  . Elevated LFTs 02/09/2021  . Substance abuse ([place]) 02/18/2013  . Alcohol dependence ([place]) 02/18/2013  . Headache disorder 02/26/2012  . 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 MDD (major depressive disorder) 12/26/2010  . HTN (hypertension) 12/26/2010  .  ALLERGIC RHINITIS 12/26/2010  . Asthma 12/26/2010    [facility] ,[place], [place]  04/03/2021, 12:31 PM  [facility] [address] [place], [name], [address] Phone: [phone]   Fax:  [phone]   Name: Kelly Zuniga MRN: [id number] Date of Birth: [birth year]

## 2021-04-08 ENCOUNTER — Ambulatory Visit: Payer: No Typology Code available for payment source

## 2021-04-08 ENCOUNTER — Other Ambulatory Visit: Payer: Self-pay

## 2021-04-08 ENCOUNTER — Ambulatory Visit: Payer: No Typology Code available for payment source | Admitting: Physical Therapy

## 2021-04-08 ENCOUNTER — Encounter: Payer: Self-pay | Admitting: Physical Therapy

## 2021-04-08 DIAGNOSIS — R41841 Cognitive communication deficit: Secondary | ICD-10-CM

## 2021-04-08 DIAGNOSIS — R2681 Unsteadiness on feet: Secondary | ICD-10-CM

## 2021-04-08 DIAGNOSIS — R2689 Other abnormalities of gait and mobility: Secondary | ICD-10-CM

## 2021-04-08 DIAGNOSIS — R4701 Aphasia: Secondary | ICD-10-CM

## 2021-04-08 NOTE — Therapy (Signed)
[facility] [address] [place], [name], [address] Phone: [phone]   Fax:  [phone]  Physical Therapy Treatment  Patient Details  Name: Kelly Zuniga MRN: [id number] Date of Birth: 10-05-1966 Referring Provider (PT): Referred by Eulogio Bear (hospitalist) but PCP is Maximiano Coss.   Encounter Date: 04/08/2021   PT End of Session - 04/08/21 1244    Visit Number 9    Number of Visits 17    PT Start Time 7622    PT Stop Time 1227    PT Time Calculation (min) 40 min    Equipment Utilized During Treatment Gait belt    Activity Tolerance Patient tolerated treatment well    Behavior During Therapy Anxious           Past Medical History:  Diagnosis Date  . Alcohol abuse   . Allergy   . Anxiety   . Asthma   . Asthma    Phreesia 11/09/2020  . Depression    posibly bipolar disorder. Sees Dr Cristy Friedlander for psychiatric care and Dr Apolonio Schneiders for therapy  . Depression    Phreesia 11/09/2020  . Headache(784.0)    last migraine 2004  . 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 Hypertension    never on meds   . Seizure ([place]) 02/09/2021  . Substance abuse ([place])    Phreesia 11/09/2020    Past Surgical History:  Procedure Laterality Date  . benign cyst     removed from left cheek  . TONSILECTOMY, ADENOIDECTOMY, BILATERAL MYRINGOTOMY AND TUBES      There were no vitals filed for this visit.   Subjective Assessment - 04/08/21 1150    Subjective No changes, no falls. Reports today is a good day.    Pertinent History 55 y.o. female with medical history significant of alcohol abuse, astma,anxiety/depression, HTN, holmes-adie syndrome reprts 3-4 days of room spinning dizziness. At work today she had continued dizziness, hand cramping and weakness. She had a brief episode of LOC falling and striking posterior occiput. Coworkers witness some whole body shaking lasting several seconds. EMS activated and patient  transported to [facility] for evaluation.   CT head w/o acute findings, MRI brain w/o acute findings.    Patient Stated Goals Pt wants to improve her balance and be able to bend over and sit to without feeling like she will fall. Also wants to build up her strength.    Currently in Pain? No/denies    Pain Onset In the past 7 days    Pain Onset In the past 7 days    Pain Onset In the past 7 days                             [facility] Adult PT Treatment/Exercise - 04/08/21 1155      Ambulation/Gait   Ambulation/Gait Yes    Ambulation/Gait Assistance 4: Min guard    Ambulation/Gait Assistance Details x1 lap ambulating with no AD, x2 laps ambulating with gentle head motions up/down right and left with pt reporting 6/10 dizziness, decr to 4/10 and then back to 0/10 with seated rest break. performed gait throughout remainder of session with no AD between activities. initial cues to relax BUE for reciprocal arm swing during gait    Ambulation Distance (Feet) 345 Feet    Assistive device None    Gait Pattern Step-through pattern;Decreased step length - right;Decreased step length - left;Decreased arm swing - right;Decreased arm  swing - left;Wide base of support    Ambulation Surface Level;Indoor               Balance Exercises - 04/08/21 0001      Balance Exercises: Standing   Standing Eyes Opened Narrow base of support (BOS);Head turns;Foam/compliant surface;Limitations    Standing Eyes Opened Limitations on blue air ex: x5 reps head turns, x5 reps head nods    Standing Eyes Closed Narrow base of support (BOS);Foam/compliant surface;3 reps;30 secs;Limitations    Standing Eyes Closed Limitations on blue air ex, 3 x 30 seconds feet together    SLS with Vectors Foam/compliant surface;Limitations    SLS with Vectors Limitations on blue foam beam, alternating toe taps to 2 cones x10 reps B, cues for gentle toe tap, incr difficulty with stance on RLE, no UE support and min guard for  balance    Stepping Strategy Anterior;Posterior;Foam/compliant surface;Limitations    Stepping Strategy Limitations on blue foam beam: x5 reps B each direction, no UE support    Tandem Gait Forward;Intermittent upper extremity support;3 reps;Limitations    Tandem Gait Limitations down and back at countertop, on level ground, needing intermittent HHA for balance    Partial Tandem Stance Eyes closed;2 reps;15 secs;Limitations    Partial Tandem Stance Limitations on level ground, performed in corner, incr difficulty with RLE posteriorly    Sidestepping 2 reps;Limitations    Sidestepping Limitations on blue foam beam no UE support, cues for incr step height    Other Standing Exercises on blue/red mats next to countertop: marching down and back x2 reps, retro gait down and back x2 reps - no UE support, intermittent min guard for balance               PT Short Term Goals - 04/03/21 1113      PT SHORT TERM GOAL #1   Title Pt will be independent with initial HEP for strength and balance.    Time 4    Period Weeks    Status Achieved    Target Date 04/04/21      PT SHORT TERM GOAL #2   Title Pt will decrease  5 x sit to stand from 51.87 sec to <40 sec for improved balance and functional strength.    Baseline 03/04/21 51.87 sec from chair without hands, 04/03/21 13.15 sec from chair without hands    Time 4    Period Weeks    Status Achieved    Target Date 04/04/21      PT SHORT TERM GOAL #3   Title Pt will increase Berg from 42 to >45/56 for improved balance and decreased fall risk.    Baseline 03/04/21 45/56. 04/01/21 43/56    Time 4    Period Weeks    Status Not Met    Target Date 04/04/21      PT SHORT TERM GOAL #4   Title Pt will ambulate >300' on level surfaces with LRAD mod I for improved mobility in home and short community distances.    Baseline 345' with cane mod I    Time 4    Period Weeks    Status Achieved    Target Date 04/04/21             PT Long Term Goals -  04/03/21 1114      PT LONG TERM GOAL #1   Title Pt will be independent with progressive HEP for strength and balance to continue gains on own.  Time 8    Period Weeks    Status New      PT LONG TERM GOAL #2   Title Pt will decrease 5 x sit to stand  to <30 sec for improved balance and functional strength.    Baseline 03/04/21 51.87 sec from chair with hands. 04/03/21 13.15 sec    Time 8    Period Weeks    Status Achieved      PT LONG TERM GOAL #3   Title Pt will increase gait speed from 0.51ms to >0.846m for improved gait safety in the community.    Baseline 03/04/21 0.6739mwith walker    Time 8    Period Weeks    Status New      PT LONG TERM GOAL #4   Title Pt will increase Berg to >50/56 for improved balance and decreased fall risk.    Baseline 03/04/21 42/56    Time 8    Period Weeks    Status New      PT LONG TERM GOAL #5   Title Pt will ambulate up/down step in reciprocal pattern with 1 rail mod I for improved access to home.    Time 8    Period Weeks    Status New                 Plan - 04/08/21 1321    Clinical Impression Statement Continued to progress gait training with no AD over level surfaces indoors. With incr gait distance, pt did better with more relaxed and reciprocal arm swing. When adding in gentle head motions, pt reported 6/10 dizziness (did not have any with gait with head motions previous session). Subsided after seated rest break. Remainder of session focused on balance on compliant surfaces with vision removed and narrow BOS. Pt tolerated remainder of session well. Will continue to progress towards LTGs.    Personal Factors and Comorbidities Comorbidity 3+    Comorbidities alcohol abuse, astma,anxiety/depression, HTN, holmes-adie syndrome    Examination-Activity Limitations Locomotion Level;Transfers;Stairs;Stand    Examination-Participation Restrictions Community Activity;Occupation;Cleaning    Stability/Clinical Decision Making  Evolving/Moderate complexity    Rehab Potential Good    PT Frequency 2x / week   plus eval   PT Duration 8 weeks    PT Treatment/Interventions ADLs/Self Care Home Management;DME Instruction;Gait training;Stair training;Functional mobility training;Therapeutic activities;Therapeutic exercise;Balance training;Neuromuscular re-education;Patient/family education;Vestibular;Visual/perceptual remediation/compensation    PT Next Visit Plan Pt has high anxiety.pt had incr dizziness with gait with no AD and head motions - monitor and maybe see if pt would need more of a vestib screen? Continue to progress gait training without AD more. I cleared just to go without around home currently as long as she is feeling good. Continue balance activities on compliant surface, narrow BOS. negotiating over obstacles.    Consulted and Agree with Plan of Care Patient           Patient will benefit from skilled therapeutic intervention in order to improve the following deficits and impairments:  Abnormal gait,Decreased balance,Decreased cognition,Decreased mobility,Decreased strength,Decreased coordination  Visit Diagnosis: Other abnormalities of gait and mobility  Unsteadiness on feet     Problem List Patient Active Problem List   Diagnosis Date Noted  . Hypomagnesemia 02/10/2021  . Generalized anxiety disorder 02/10/2021  . Insomnia 02/10/2021  . [place] due [place]  . Head injury 02/10/2021  . Macrocytic anemia 02/10/2021  . 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 Seizure ([place]  02/09/2021  . Elevated LFTs 02/09/2021  . Substance abuse ([place]) 02/18/2013  . Alcohol dependence ([place]) 02/18/2013  . Headache disorder 02/26/2012  . 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 MDD (major depressive disorder) 12/26/2010  . HTN (hypertension) 12/26/2010  . ALLERGIC RHINITIS 12/26/2010  . Asthma 12/26/2010    Arliss Journey, PT, DPT  04/08/2021, 1:25  PM  [facility] [address] [place], [name], [address] Phone: [phone]   Fax:  [phone]  Name: Kelly Zuniga MRN: [id number] Date of Birth: Dec 29, 1965

## 2021-04-08 NOTE — Therapy (Signed)
[facility] [address] [place], [name], [address] Phone: [phone]   Fax:  [phone]  Speech Language Pathology Treatment  Patient Details  Name: Kelly Zuniga MRN: [id number] Date of Birth: 11/30/1966 Referring Provider (SLP): Eulogio Bear, DO   Encounter Date: 04/08/2021   End of Session - 04/08/21 2352    Visit Number 9    Number of Visits 17    Date for SLP Re-Evaluation 06/02/21    SLP Start Time 1103    SLP Stop Time  1145    SLP Time Calculation (min) 42 min    Activity Tolerance Patient tolerated treatment well           Past Medical History:  Diagnosis Date  . Alcohol abuse   . Allergy   . Anxiety   . Asthma   . Asthma    Phreesia 11/09/2020  . Depression    posibly bipolar disorder. Sees Dr Cristy Friedlander for psychiatric care and Dr Apolonio Schneiders for therapy  . Depression    Phreesia 11/09/2020  . Headache(784.0)    last migraine 2004  . 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 Hypertension    never on meds   . Seizure ([place]) 02/09/2021  . Substance abuse ([place])    Phreesia 11/09/2020    Past Surgical History:  Procedure Laterality Date  . benign cyst     removed from left cheek  . TONSILECTOMY, ADENOIDECTOMY, BILATERAL MYRINGOTOMY AND TUBES      There were no vitals filed for this visit.   Subjective Assessment - 04/08/21 1106    Subjective "I forgot my homework but I wasn't going to have my chauffeur turn around."    Currently in Pain? No/denies                 ADULT SLP TREATMENT - 04/08/21 1106      General Information   Behavior/Cognition Alert;Cooperative;Pleasant mood      Treatment Provided   Treatment provided Cognitive-Linquistic      Cognitive-Linquistic Treatment   Treatment focused on Cognition;Aphasia;Patient/family/caregiver education    Skilled Treatment Pt rates her speech "around an 8 (out of 10)." As pt progresses through her day it increases closer  to a 10. "Monday was not a good day because of tax day, I was stressed and tired." SLP reiterated SLP believes pt's neuro symptoms have largely resolved and pt's level of stress/anxiety/psychological factors are playing a primary role in her speech and language at this time. SLP taught pt verb Network Strengthening Treatment (VNeST) as pt stated she had more difficulty generating verbs than nouns.      Assessment / Recommendations / Plan   Plan Continue with current plan of care   d/c in 2-4 sessions suspected     Progression Toward Goals   Progression toward goals Progressing toward goals              SLP Short Term Goals - 04/03/21 1228      SLP SHORT TERM GOAL #1   Title pt will report successful usage of a med tracking system between 3 sessions    Baseline 03-25-21 (Alexa timer),    Period --   or 9 sessions, for all STGs   Status Partially Met      SLP SHORT TERM GOAL #2   Title pt will demonstrate WNL verbal expression in 15 minutes mod complex conversation in 4 sessions using compensations PRN    Baseline 03-27-21    Status  Partially Met      SLP SHORT TERM GOAL #3   Title pt will complete cognitive assessment in the first two therapy sessions    Period --   sessions   Status Achieved            SLP Long Term Goals - 04/08/21 2354      SLP LONG TERM GOAL #1   Title pt will demonstrate WNL verbal expression in 15 minutes mod complex-complex conversation in 4 sessions using compensations PRN    Baseline 04-03-21, 04/08/21    Time 4    Period Weeks   or 17 total sesisons, for all LTGs   Status On-going      SLP LONG TERM GOAL #2   Title pt will indicate a frustration level regarding aonmia which does not debilitate further conversation    Baseline 04-03-21    Status Achieved            Plan - 04/08/21 2352    Clinical Impression Statement Sarahi presents again today with speech and language WNL - no dysluency nor anomia observed during session. SLP believes pt has  still some degree of cognitive deficit, although not highlighted on CLQT. SLP believes that pt will benefit from skilled ST targeting cognition and verbal expression to improve QOL although this SLP believes her deficts are primarily psychologically based at this time as pt again told SLP a "bad" speech day was due to increased stress. SLP suspects pt will be d/c'd in at least 2 and as many as 4 sessions.    Speech Therapy Frequency 2x / week    Duration 8 weeks   or 17 total visits   Treatment/Interventions Environmental controls;Functional tasks;Compensatory techniques;SLP instruction and feedback;Multimodal communcation approach;Language facilitation;Cueing hierarchy;Cognitive reorganization;Patient/family education;Internal/external aids    Potential to Achieve Goals Good    SLP Home Exercise Plan provided    Consulted and Agree with Plan of Care Patient           Patient will benefit from skilled therapeutic intervention in order to improve the following deficits and impairments:   Aphasia  Cognitive communication deficit    Problem List Patient Active Problem List   Diagnosis Date Noted  . Hypomagnesemia 02/10/2021  . Generalized anxiety disorder 02/10/2021  . Insomnia 02/10/2021  . Fall due to seizure ([place]) 02/10/2021  . Head injury 02/10/2021  . Macrocytic anemia 02/10/2021  . Seizure as late effect of cerebrovascular accident (CVA) ([place]) 02/10/2021  . Seizure ([place]) 02/09/2021  . Elevated LFTs 02/09/2021  . Substance abuse ([place]) 02/18/2013  . Alcohol dependence ([place]) 02/18/2013  . Headache disorder 02/26/2012  . 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 MDD (major depressive disorder) 12/26/2010  . HTN (hypertension) 12/26/2010  . ALLERGIC RHINITIS 12/26/2010  . Asthma 12/26/2010    [facility] ,[place], [place]  04/08/2021, 11:55 PM  [facility] [address] [place], [name], [address] Phone: [phone]   Fax:   [phone]   Name: Gerianne Simonet MRN: [id number] Date of Birth: 12-28-1965

## 2021-04-08 NOTE — Patient Instructions (Signed)
  VERB NETWROK STRENGTHENING TREATMENT - (VNeST) Do this with any verb you have difficulty generating on your own  Step 1 Create 3 pairs of subjects/objects for the verb. Think of 3 people who could perform the action, or start by thinking of 3 objects the action could be done to. It might be easier to work on one complete set at a time, thinking of the agent and patient together. The goal is to be as specific as possible with the nouns, so farmer would be a better agent than man for the verb drive. Write down each noun in the appropriate column so you'll have 3 triads (subject-verb-object). It's okay to get personal. Family members, friends, and pets make great agents for VNeST, as long as that person would actually DO the target action! Just try to vary the responses, so not all the nouns are personal or one type of profession. Similarly, try to use many different meanings of the verb if possible.  Step 2 Read each triad of words aloud. It's not important to conjugate the verb or add any articles to the nouns ("farmer drive tractor"), but it's okay if you do ("the farmer drives the tractor").    Step 3 Select one of the three triads to expand upon by asking "WHERE" it happens, "WHY" it happens, and "WHEN" it happens. Try to be as specific as possible and write the responses down to expand the sentence. Then read the original triad followed by the longer, more detailed sentence.

## 2021-04-10 ENCOUNTER — Ambulatory Visit: Payer: No Typology Code available for payment source

## 2021-04-10 ENCOUNTER — Other Ambulatory Visit: Payer: Self-pay

## 2021-04-10 VITALS — BP 148/93 | HR 100

## 2021-04-10 DIAGNOSIS — R2689 Other abnormalities of gait and mobility: Secondary | ICD-10-CM

## 2021-04-10 DIAGNOSIS — R2681 Unsteadiness on feet: Secondary | ICD-10-CM

## 2021-04-10 DIAGNOSIS — M6281 Muscle weakness (generalized): Secondary | ICD-10-CM

## 2021-04-10 NOTE — Patient Instructions (Signed)
Warning Signs of a Stroke A stroke is a medical emergency and should be treated right away--every second counts. A stroke is caused by a decrease or block in blood flow to the brain. When certain areas of the brain do not get enough oxygen, brain cells begin to die. A stroke can lead to brain damage and can sometimes be life-threatening. However, if a person gets medical treatment right away, he or she has a better chance of surviving and recovering from a stroke. It is very important to be able to recognize the symptoms of a stroke. Types of strokes There are two main types of strokes:  Ischemic stroke. This is the most common type. This stroke happens when a blood vessel that supplies blood to the brain is blocked.  Hemorrhagic stroke. This results from bleeding in the brain when a blood vessel leaks or bursts (ruptures). A transient ischemic attack (TIA) causes stroke-like symptoms that go away quickly. Unlike a stroke, a TIA does not cause permanent damage to the brain. However, the symptoms of a TIA are the same as a stroke. TIAs also require medical treatment right away. Having a TIA is a sign that you are at higher risk for a stroke. Warning signs of a stroke The symptoms of stroke may vary and will reflect the part of the brain that is involved. Symptoms usually happen suddenly. "BE FAST" is an easy way to remember the main warning signs of a stroke:  B - Balance. Signs are dizziness, sudden trouble walking, or loss of balance.  E - Eyes. Signs are trouble seeing or a sudden change in vision.  F - Face. Signs are sudden weakness or numbness of the face, or the face or eyelid drooping on one side.  A - Arms. Signs are weakness or numbness in an arm. This happens suddenly and usually on one side of the body.  S - Speech. Signs are sudden trouble speaking, slurred speech, or trouble understanding what people say.  T - Time. Time to call emergency services. Write down what time symptoms  started. Other signs of a stroke Some less common signs of a stroke include:  A sudden, severe headache with no known cause.  Nausea or vomiting.  Seizure. A stroke may be happening even if only one "BE FAST" symptom is present. These symptoms may represent a serious problem that is an emergency. Do not wait to see if the symptoms will go away. Get medical help right away. Call your local emergency services (911 in the [place] Do not drive yourself to the hospital.   Summary  A stroke is a medical emergency and should be treated right away--every second counts.  "BE FAST" is an easy way to remember the main warning signs of a stroke.  Call your local emergency services right away if you or someone else has any stroke symptoms, even if the symptoms go away.  Make note of what time the first symptoms appeared. Emergency responders or emergency room staff will need to know this information.  Do not wait to see if symptoms will go away. Call 911 even if only one of the "BE FAST" symptoms appears. This information is not intended to replace advice given to you by your health care provider. Make sure you discuss any questions you have with your health care provider. Document Revised: 07/08/2020 Document Reviewed: 07/08/2020 Elsevier Patient Education  2021 Elsevier Inc.  

## 2021-04-10 NOTE — Therapy (Signed)
[facility] [address] [place], [name], [address] Phone: [phone]   Fax:  [phone]  Physical Therapy Treatment  Patient Details  Name: Kelly Zuniga MRN: [id number] Date of Birth: 10/30/1966 Referring Provider (PT): Referred by Kelly Zuniga (hospitalist) but PCP is Kelly Zuniga.   Encounter Date: 04/10/2021   PT End of Session - 04/10/21 1148    Visit Number 9   arrived - no charge   Number of Visits 17    PT Start Time 0160    PT Stop Time 1216    PT Time Calculation (min) 27 min    Equipment Utilized During Treatment Gait belt    Activity Tolerance Patient tolerated treatment well    Behavior During Therapy Anxious           Past Medical History:  Diagnosis Date  . Alcohol abuse   . Allergy   . Anxiety   . Asthma   . Asthma    Phreesia 11/09/2020  . Depression    posibly bipolar disorder. Sees Kelly Zuniga for psychiatric care and Kelly Zuniga for therapy  . Depression    Phreesia 11/09/2020  . Headache(784.0)    last migraine 2004  . 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 Hypertension    never on meds   . Seizure ([place]) 02/09/2021  . Substance abuse ([place])    Phreesia 11/09/2020    Past Surgical History:  Procedure Laterality Date  . benign cyst     removed from left cheek  . TONSILECTOMY, ADENOIDECTOMY, BILATERAL MYRINGOTOMY AND TUBES      Vitals:   04/10/21 1156  BP: (!) 148/93  Pulse: 100  SpO2: 99%     Subjective Assessment - 04/10/21 1149    Subjective Patient reports that she was tired yesterday after session on Tuesday. Had episode yesterday where she was unsure if it was seizure, but felt some numbess/weakness for short period of time as well as balance was off. Did not go to ED.    Pertinent History 55 y.o. female with medical history significant of alcohol abuse, astma,anxiety/depression, HTN, holmes-adie syndrome reprts 3-4 days of room spinning dizziness. At work  today she had continued dizziness, hand cramping and weakness. She had a brief episode of LOC falling and striking posterior occiput. Coworkers witness some whole body shaking lasting several seconds. EMS activated and patient transported to [facility] for evaluation.   CT head w/o acute findings, MRI brain w/o acute findings.    Patient Stated Goals Pt wants to improve her balance and be able to bend over and sit to without feeling like she will fall. Also wants to build up her strength.    Currently in Pain? No/denies    Pain Onset In the past 7 days    Pain Onset In the past 7 days    Pain Onset In the past 7 days             Patient arrived to therapy session today. Patient reports increased dizziness during appointment Tuesday. Yesterday (Wednesday, April 20th) patient reports that she went to sleep and woke up with some weakness and reduced sensation. Patient reports she also felt off balance. She rested the rest of the day, and has began to feel better today. BP: 148/93, HR: 100, Sp02: 99%. No therapy session completed today due to patient reports. PT completed general strength assessment, no weakness noted. No abnormal sensations noted, just mild dimished sensation on R side.  Patient would  like to have MRI and follow up with Kelly Zuniga prior to PT services, with PT verbalizing agreement. PT educating on signs/symptoms of CVA (handout provided) and to monitor BP. If any of signs/symptoms arise to go to ED or call 911 immediately. Patient verbalized understanding.       PT Short Term Goals - 04/03/21 1113      PT SHORT TERM GOAL #1   Title Pt will be independent with initial HEP for strength and balance.    Time 4    Period Weeks    Status Achieved    Target Date 04/04/21      PT SHORT TERM GOAL #2   Title Pt will decrease  5 x sit to stand from 51.87 sec to <40 sec for improved balance and functional strength.    Baseline 03/04/21 51.87 sec from chair without hands, 04/03/21 13.15 sec from  chair without hands    Time 4    Period Weeks    Status Achieved    Target Date 04/04/21      PT SHORT TERM GOAL #3   Title Pt will increase Berg from 42 to >45/56 for improved balance and decreased fall risk.    Baseline 03/04/21 45/56. 04/01/21 43/56    Time 4    Period Weeks    Status Not Met    Target Date 04/04/21      PT SHORT TERM GOAL #4   Title Pt will ambulate >300' on level surfaces with LRAD mod I for improved mobility in home and short community distances.    Baseline 345' with cane mod I    Time 4    Period Weeks    Status Achieved    Target Date 04/04/21             PT Long Term Goals - 04/03/21 1114      PT LONG TERM GOAL #1   Title Pt will be independent with progressive HEP for strength and balance to continue gains on own.    Time 8    Period Weeks    Status New      PT LONG TERM GOAL #2   Title Pt will decrease 5 x sit to stand  to <30 sec for improved balance and functional strength.    Baseline 03/04/21 51.87 sec from chair with hands. 04/03/21 13.15 sec    Time 8    Period Weeks    Status Achieved      PT LONG TERM GOAL #3   Title Pt will increase gait speed from 0.21ms to >0.838m for improved gait safety in the community.    Baseline 03/04/21 0.6737mwith walker    Time 8    Period Weeks    Status New      PT LONG TERM GOAL #4   Title Pt will increase Berg to >50/56 for improved balance and decreased fall risk.    Baseline 03/04/21 42/56    Time 8    Period Weeks    Status New      PT LONG TERM GOAL #5   Title Pt will ambulate up/down step in reciprocal pattern with 1 rail mod I for improved access to home.    Time 8    Period Weeks    Status New                  Patient will benefit from skilled therapeutic intervention in order to improve the following deficits and impairments:  Visit Diagnosis: Other abnormalities of gait and mobility  Unsteadiness on feet  Muscle weakness (generalized)     Problem  List Patient Active Problem List   Diagnosis Date Noted  . Hypomagnesemia 02/10/2021  . Generalized anxiety disorder 02/10/2021  . Insomnia 02/10/2021  . Fall due to seizure ([place]) 02/10/2021  . Head injury 02/10/2021  . Macrocytic anemia 02/10/2021  . Seizure as late effect of cerebrovascular accident (CVA) ([place]) 02/10/2021  . Seizure ([place]) 02/09/2021  . Elevated LFTs 02/09/2021  . Substance abuse ([place]) 02/18/2013  . Alcohol dependence ([place]) 02/18/2013  . Headache disorder 02/26/2012  . 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 MDD (major depressive disorder) 12/26/2010  . HTN (hypertension) 12/26/2010  . ALLERGIC RHINITIS 12/26/2010  . Asthma 12/26/2010    Jones Bales, PT, DPT 04/10/2021, 1:01 PM  [facility] [address] [place], [name], [address] Phone: [phone]   Fax:  [phone]  Name: Kelly Zuniga MRN: [id number] Date of Birth: November 15, 1966

## 2021-04-11 ENCOUNTER — Ambulatory Visit (HOSPITAL_COMMUNITY)
Admission: RE | Admit: 2021-04-11 | Discharge: 2021-04-11 | Disposition: A | Discharge status: Home / Self Care | Payer: No Typology Code available for payment source | Source: Ambulatory Visit | Attending: Registered Nurse | Admitting: Registered Nurse

## 2021-04-11 DIAGNOSIS — Z01812 Encounter for preprocedural laboratory examination: Secondary | ICD-10-CM | POA: Diagnosis present

## 2021-04-11 DIAGNOSIS — Z20822 Contact with and (suspected) exposure to covid-19: Secondary | ICD-10-CM | POA: Diagnosis not present

## 2021-04-12 LAB — SARS CORONAVIRUS 2 (TAT 6-24 HRS): SARS Coronavirus 2: NEGATIVE

## 2021-04-14 ENCOUNTER — Encounter (HOSPITAL_COMMUNITY): Payer: Self-pay | Admitting: *Deleted

## 2021-04-14 NOTE — Progress Notes (Signed)
Anesthesia Chart Review: SAME DAY WORK-UP   Case: [id number] Date/Time: 04/15/21 0800   Procedure: MRI/CT OF ABDOMEN WITH ANESTHESIA (N/A )   Anesthesia type: General   Pre-op diagnosis: RIGHT UPPER QUADRANT ABDOMENAL PAIN   Location: [facility] RADIOLOGY [facility] / [facility] OR   Surgeons: Radiologist, Medication, MD      DISCUSSION: Patient is a 55 year old female scheduled for MRI/MRCP under anesthesia. She has had at least mildly elevated LFTs with increase in common bile duct dilation of unclear etiology (consider obstructing calculus, stricture or mass) on US over the past 2 months. MRI/MRCP recommended to further evaluate. This was ordered by her PCP Morrow, Richard, NP following evaluation on 03/25/21. H&P form also scanned under Media tab.  History includes smoking (tobacco and using E-cig to help quit), HTN, anxiety, depression, asthma, Holmes-Adie Syndrome, alcohol abuse (currently reported ~ 1 glass wine/day), seizure (01/2021, possibly alcohol withdrawal by notes). - Hospitalization at [facility] 02/09/21-02/12/21 after syncopal episode with seizure-like activity. By notes, had 3-4 days of vertigo prior to episode. 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 She had another sensation of vertigo at work followed by brief LOC. Co-workers noted seizure-like activity. She continued to report dizziness and tremors. She reported daily wine, but denied history of alcohol withdrawal. She was placed on CIWA protocol.  and developed hand cramping in weakness. Head CT showed right scalp contusion, cortical atrophy, but no acute intracranial findings. MRI brain also showed no acute intracranial findings. On return to MRI, she had witnessed event where she was unresponsive, had abnormal movement without tonic-clonic activity, and lateral gaze. Neurology consulted (Dr. John Absher) and recommended Keppra with out-patient follow-up. He was suspicuos for ETOh withdrawal seizure "given constellation of  supporting data" like ketonuria, previous nausea, tremor. EEG showed no seizure activity. He doubted vertebrobasilar insufficiency with "drop attack and do not think that CTA of the head and neck are necessary" at that time. She also had abdominal US during admission due to elevated LFTs but otherwise asymptomatic. This shows dilated common bile duct, so out-patient follow-up planned.    Since hospital discharge, she has been evaluated by her PCP Morrow, Richard, NP at least twice. He notes her frustration regarding uncertainty of definitive etiology of seizures, although in-patient alcohol withdrawal suspected per in-patient neurologist with reported episode of binge drinking to deal with anxiety following her discharge. She apparently felt she was having some expressive aphasia issues since then, so he did arranged for HH PT/OT/SLP while awaiting out-patient visit with neurology. He rechecked her LFTs which remained mildly elevated (AST 74, ALT 52 as of 02/24/21), so rechecked abdominal US which showed further dilation of her CBD so MRI/MRCP recommended. At her last visit on 03/25/21, he noted she had another episode of brief syncope the week prior while walking her dog. No murmur on exam. EKG from 02/09/21 showed SR, possible old anterior infarct. Although cardiac etiology not suspected, he did refer her to cardiology with visit scheduled for 05/20/21 with Dr. Christopher. He also referred her to neuropsychology, and he ordered the MRI/MRCP to further evaluate the cause of her CBD dilation.   Reviewed available information with anesthesiologist Hodierne, Adam, MD. As above patient with history of seizure and/or syncope starting in 01/2021, possible related to ETOH withdrawl. Also with new CBD dilation, worsening on follow-up US this month. She has had neurology evaluation while inpatient and is on Keppra and Topamax. Currently, cardiology etiology not suspected, but cardiology evaluation   is planned next month per  request of PCP. Appears PCP [name], NP is well aware of all of this and has been coordinating much of her care since her hospitalization. He has completed her H&P for this procedure under anesthesia. Anesthesia team to evaluate on the day of procedure. Her 04/11/21 COVID-19 test was negative.    VS:   Wt Readings from Last 3 Encounters:  03/25/21 43.8 kg  02/24/21 41.6 kg  02/10/21 46.9 kg   BP Readings from Last 3 Encounters:  04/10/21 (!) 148/93  04/01/21 (!) 148/82  03/25/21 128/76   Pulse Readings from Last 3 Encounters:  04/10/21 100  03/25/21 91  02/24/21 90    PROVIDERS: [name], NP is PCP  - Levert Feinstein, MD is neurologist. Scheduled to get established 04/17/21.  Jodelle Red, MD is cardiologist. Scheduled to get established 05/20/21. - By notes, she sees provider at [facility]   LABS: For day of procedure as indicated. As of 02/24/21, Cr 0.54, AST 74, ALT 52, total bilirubin 0.3, H/H 11.6/34.1, glucose 71. PLT count 189 02/12/21.   OTHER: EEG 02/10/21: IMPRESSION: This study is within normal limits. No seizures or epileptiform discharges were seen throughout the recording.   IMAGES: [place] Abd 03/18/21: IMPRESSION: Dilated common bile duct measuring up to 14 mm may be due to a distal obstructing calculus, stricture, or mass. - Further evaluation with MRI/MRCP is recommended. (Comparison 02/10/21 done for elevated LFTs: CBD measured 10.1 mm)  MRI Head 02/09/21: IMPRESSION: 1. No acute intracranial abnormality. 2. Mild chronic small vessel ischemic disease and cerebral atrophy. 3. Small right parietal scalp hematoma.  CT Head 02/09/21: IMPRESSION: No acute intracranial normality. Cortical atrophy. Scalp contusion on the right.  CXR 08/26/20: FINDINGS: - AP and cross-table lateral views are submitted. The AP view was repeated. - The heart size and mediastinal contours are normal. There is mild scarring at the left lung base  associated with blunting of the costophrenic angle. The lungs are mildly hyperinflated but otherwise clear. There is no pleural effusion or pneumothorax. No acute osseous findings. IMPRESSION: No active cardiopulmonary process.   EKG: 02/09/21: Sinus rhythm Left axis deviation RSR' in V1 or V2, probably normal variant Probable anteroseptal infarct, old no prior for comparison Confirmed by Alona Bene [phone]) on 02/09/2021 2:00:50 PM  CV:  Past Medical History:  Diagnosis Date  . Alcohol abuse   . Allergy   . Anxiety   . Asthma   . Asthma    Phreesia 11/09/2020  . Depression    posibly bipolar disorder. Sees Dr [name] for psychiatric care and Dr [name] for therapy  . Depression    Phreesia 11/09/2020  . Headache(784.0)    last migraine 2004  . 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 Hypertension    never on meds   . Seizure ([facility]) 02/09/2021  . Substance abuse ([facility])    Phreesia 11/09/2020    Past Surgical History:  Procedure Laterality Date  . benign cyst     removed from left cheek  . TONSILECTOMY, ADENOIDECTOMY, BILATERAL MYRINGOTOMY AND TUBES      MEDICATIONS: No current facility-administered medications for this encounter.   Marland Kitchen albuterol (PROVENTIL) (2.5 MG/3ML) 0.083% nebulizer solution  . albuterol (VENTOLIN HFA) 108 (90 Base) MCG/ACT inhaler  . B Complex-C (SUPER B COMPLEX/VITAMIN C PO)  . BIOTIN PO  . cetirizine (ZYRTEC) 10 MG tablet  . clonazePAM (KLONOPIN) 0.5 MG tablet  . escitalopram (LEXAPRO) 10 MG tablet  .  levETIRAcetam (KEPPRA) 500 MG tablet  . magnesium gluconate (MAGONATE) 500 MG tablet  . Multiple Vitamins-Minerals (MULTI ADULT GUMMIES) CHEW  . naproxen sodium (ALEVE) 220 MG tablet  . topiramate (TOPAMAX) 25 MG tablet  . triamcinolone (NASACORT) 55 MCG/ACT nasal inhaler  . thiamine 100 MG tablet    Shonna Chock, PA-C Surgical Short Stay/Anesthesiology [facility] Phone [phone] [facility] Phone [phone] 04/14/2021 4:43  PM

## 2021-04-14 NOTE — H&P (View-Only) (Signed)
Anesthesia Chart Review: Kelly Zuniga   Case: [id number] Date/Time: 04/15/21 0800   Procedure: MRI/CT OF ABDOMEN WITH ANESTHESIA (N/A )   Anesthesia type: General   Pre-op diagnosis: RIGHT UPPER QUADRANT ABDOMENAL PAIN   Location: [facility] RAD[facility] / [facility]   Surgeons: Radiologist, Medication, MD      DISCUSSION: Patient is a 55 year old female scheduled for MRI/MRCP under anesthesia. She has had at least mildly elevated LFTs with increase in common bile duct dilation of unclear etiology (consider obstructing calculus, stricture or mass) on [place] over the past 2 months. MRI/MRCP recommended to further evaluate. This was ordered by her PCP Kelly Agee, NP following evaluation on 03/25/21. H&P form also scanned under Media tab.  History includes smoking (tobacco and using E-cig to help quit), HTN, anxiety, depression, asthma, Holmes-Adie Syndrome, alcohol abuse (currently reported ~ 1 glass wine/day), seizure (01/2021, possibly alcohol withdrawal by notes). - Hospitalization at [facility] 02/09/21-02/12/21 after syncopal episode with seizure-like activity. By notes, had 3-4 days of vertigo prior to episode. 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 She had another sensation of vertigo at work followed by brief LOC. Co-workers noted seizure-like activity. She continued to report dizziness and tremors. She reported daily wine, but denied history of alcohol withdrawal. She was placed on CIWA protocol.  and developed hand cramping in weakness. Head CT showed right scalp contusion, cortical atrophy, but no acute intracranial findings. MRI brain also showed no acute intracranial findings. On return to MRI, she had witnessed event where she was unresponsive, had abnormal movement without tonic-clonic activity, and lateral gaze. Neurology consulted (Dr. Merri Zuniga) and recommended Keppra with out-patient follow-up. He was suspicuos for ETOh withdrawal seizure "given constellation of  supporting data" like ketonuria, previous nausea, tremor. EEG showed no seizure activity. He doubted vertebrobasilar insufficiency with "drop attack and do not think that CTA of the head and neck are necessary" at that time. She also had abdominal US during admission due to elevated LFTs but otherwise asymptomatic. This shows dilated common bile duct, so out-patient follow-up planned.    Since hospital discharge, she has been evaluated by her PCP Kelly Agee, NP at least twice. He notes her frustration regarding uncertainty of definitive etiology of seizures, although in-patient alcohol withdrawal suspected per in-patient neurologist with reported episode of binge drinking to deal with anxiety following her discharge. She apparently felt she was having some expressive aphasia issues since then, so he did arranged for [facility] PT/OT/SLP while awaiting out-patient visit with neurology. He rechecked her LFTs which remained mildly elevated (AST 74, ALT 52 as of 02/24/21), so rechecked abdominal US which showed further dilation of her CBD so MRI/MRCP recommended. At her last visit on 03/25/21, he noted she had another episode of brief syncope the week prior while walking her dog. No murmur on exam. EKG from 02/09/21 showed SR, possible old anterior infarct. Although cardiac etiology not suspected, he did refer her to cardiology with visit scheduled for 05/20/21 with Dr. Cristal Zuniga. He also referred her to neuropsychology, and he ordered the MRI/MRCP to further evaluate the cause of her CBD dilation.   Reviewed available information with anesthesiologist Kelly Rich, MD. As above patient with history of seizure and/or syncope starting in 01/2021, possible related to ETOH withdrawl. Also with new CBD dilation, worsening on follow-up US this month. She has had neurology evaluation while inpatient and is on Keppra and Topamax. Currently, cardiology etiology not suspected, but cardiology evaluation  is planned next month per  request of PCP. Appears PCP Kelly Agee, NP is well aware of all of this and has been coordinating much of her care since her hospitalization. He has completed her H&P for this procedure under anesthesia. Anesthesia team to evaluate on the day of procedure. Her 04/11/21 COVID-19 test was negative.    VS:   Wt Readings from Last 3 Encounters:  03/25/21 43.8 kg  02/24/21 41.6 kg  02/10/21 46.9 kg   BP Readings from Last 3 Encounters:  04/10/21 (!) 148/93  04/01/21 (!) 148/82  03/25/21 128/76   Pulse Readings from Last 3 Encounters:  04/10/21 100  03/25/21 91  02/24/21 90    PROVIDERS: Kelly Agee, NP is PCP  - Levert Feinstein, MD is neurologist. Scheduled to get established 04/17/21.  Jodelle Red, MD is cardiologist. Scheduled to get established 05/20/21. - By notes, she sees provider at [facility]   LABS: For day of procedure as indicated. As of 02/24/21, Cr 0.54, AST 74, ALT 52, total bilirubin 0.3, H/H 11.6/34.1, glucose 71. PLT count 189 02/12/21.   OTHER: EEG 02/10/21: IMPRESSION: This study is within normal limits. No seizures or epileptiform discharges were seen throughout the recording.   IMAGES: [place] Abd 03/18/21: IMPRESSION: Dilated common bile duct measuring up to 14 mm may be due to a distal obstructing calculus, stricture, or mass. - Further evaluation with MRI/MRCP is recommended. (Comparison 02/10/21 done for elevated LFTs: CBD measured 10.1 mm)  MRI Head 02/09/21: IMPRESSION: 1. No acute intracranial abnormality. 2. Mild chronic small vessel ischemic disease and cerebral atrophy. 3. Small right parietal scalp hematoma.  CT Head 02/09/21: IMPRESSION: No acute intracranial normality. Cortical atrophy. Scalp contusion on the right.  CXR 08/26/20: FINDINGS: - AP and cross-table lateral views are submitted. The AP view was repeated. - The heart size and mediastinal contours are normal. There is mild scarring at the left lung base  associated with blunting of the costophrenic angle. The lungs are mildly hyperinflated but otherwise clear. There is no pleural effusion or pneumothorax. No acute osseous findings. IMPRESSION: No active cardiopulmonary process.   EKG: 02/09/21: Sinus rhythm Left axis deviation RSR' in V1 or V2, probably normal variant Probable anteroseptal infarct, old no prior for comparison Confirmed by Alona Bene [phone]) on 02/09/2021 2:00:50 PM  CV:  Past Medical History:  Diagnosis Date  . Alcohol abuse   . Allergy   . Anxiety   . Asthma   . Asthma    Phreesia 11/09/2020  . Depression    posibly bipolar disorder. Sees Dr [name] for psychiatric care and Dr [name] for therapy  . Depression    Phreesia 11/09/2020  . Headache(784.0)    last migraine 2004  . 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 Hypertension    never on meds   . Seizure (HCC) 02/09/2021  . Substance abuse (HCC)    Phreesia 11/09/2020    Past Surgical History:  Procedure Laterality Date  . benign cyst     removed from left cheek  . TONSILECTOMY, ADENOIDECTOMY, BILATERAL MYRINGOTOMY AND TUBES      MEDICATIONS: No current facility-administered medications for this encounter.   Marland Kitchen albuterol (PROVENTIL) (2.5 MG/3ML) 0.083% nebulizer solution  . albuterol (VENTOLIN HFA) 108 (90 Base) MCG/ACT inhaler  . B Complex-C (SUPER B COMPLEX/VITAMIN C PO)  . BIOTIN PO  . cetirizine (ZYRTEC) 10 MG tablet  . clonazePAM (KLONOPIN) 0.5 MG tablet  . escitalopram (LEXAPRO) 10 MG tablet  .  levETIRAcetam (KEPPRA) 500 MG tablet  . magnesium gluconate (MAGONATE) 500 MG tablet  . Multiple Vitamins-Minerals (MULTI ADULT GUMMIES) CHEW  . naproxen sodium (ALEVE) 220 MG tablet  . topiramate (TOPAMAX) 25 MG tablet  . triamcinolone (NASACORT) 55 MCG/ACT nasal inhaler  . thiamine 100 MG tablet    Shonna Chock, PA-C Surgical Short Stay/Anesthesiology [facility] Phone [phone] [facility] Phone [phone] 04/14/2021 4:43  PM

## 2021-04-14 NOTE — Progress Notes (Signed)
Spoke with pt for pre-op call. Pt denies heart history, but is to see a cardiologist for her blood pressure. She also states she either has seizures or little strokes, states they have not completely decided what she is having. She is on anti-seizure medicine.  Covid test done 04/11/21 and it's negative. Pt states she's been in quarantine since the test was done and understands that she stays in quarantine until she comes to the hospital tomorrow.  Chart sent to Anesthesia PA

## 2021-04-15 ENCOUNTER — Ambulatory Visit: Payer: No Typology Code available for payment source

## 2021-04-15 ENCOUNTER — Ambulatory Visit (HOSPITAL_COMMUNITY): Payer: No Typology Code available for payment source | Admitting: Vascular Surgery

## 2021-04-15 ENCOUNTER — Ambulatory Visit (HOSPITAL_COMMUNITY)
Admission: RE | Admit: 2021-04-15 | Discharge: 2021-04-15 | Disposition: A | Discharge status: Home / Self Care | Payer: No Typology Code available for payment source | Source: Ambulatory Visit | Attending: Registered Nurse | Admitting: Registered Nurse

## 2021-04-15 ENCOUNTER — Ambulatory Visit (HOSPITAL_COMMUNITY)
Admission: RE | Admit: 2021-04-15 | Discharge: 2021-04-15 | Disposition: A | Discharge status: Home / Self Care | Payer: No Typology Code available for payment source | Source: Ambulatory Visit | Attending: Anesthesiology | Admitting: Anesthesiology

## 2021-04-15 ENCOUNTER — Encounter (HOSPITAL_COMMUNITY): Admission: RE | Disposition: A | Discharge status: Home / Self Care | Payer: Self-pay | Source: Ambulatory Visit

## 2021-04-15 ENCOUNTER — Encounter (HOSPITAL_COMMUNITY): Payer: Self-pay

## 2021-04-15 ENCOUNTER — Other Ambulatory Visit: Payer: Self-pay

## 2021-04-15 DIAGNOSIS — R1011 Right upper quadrant pain: Secondary | ICD-10-CM | POA: Insufficient documentation

## 2021-04-15 DIAGNOSIS — F17290 Nicotine dependence, other tobacco product, uncomplicated: Secondary | ICD-10-CM | POA: Insufficient documentation

## 2021-04-15 DIAGNOSIS — Z79899 Other long term (current) drug therapy: Secondary | ICD-10-CM | POA: Insufficient documentation

## 2021-04-15 DIAGNOSIS — R7989 Other specified abnormal findings of blood chemistry: Secondary | ICD-10-CM

## 2021-04-15 DIAGNOSIS — K760 Fatty (change of) liver, not elsewhere classified: Secondary | ICD-10-CM | POA: Insufficient documentation

## 2021-04-15 DIAGNOSIS — K838 Other specified diseases of biliary tract: Secondary | ICD-10-CM

## 2021-04-15 DIAGNOSIS — Z791 Long term (current) use of non-steroidal anti-inflammatories (NSAID): Secondary | ICD-10-CM | POA: Insufficient documentation

## 2021-04-15 DIAGNOSIS — R569 Unspecified convulsions: Secondary | ICD-10-CM

## 2021-04-15 DIAGNOSIS — Z888 Allergy status to other drugs, medicaments and biological substances status: Secondary | ICD-10-CM | POA: Insufficient documentation

## 2021-04-15 DIAGNOSIS — K828 Other specified diseases of gallbladder: Secondary | ICD-10-CM | POA: Insufficient documentation

## 2021-04-15 DIAGNOSIS — Z885 Allergy status to narcotic agent status: Secondary | ICD-10-CM | POA: Insufficient documentation

## 2021-04-15 HISTORY — DX: Pneumonia, unspecified organism: J18.9

## 2021-04-15 HISTORY — PX: RADIOLOGY WITH ANESTHESIA: SHX6223

## 2021-04-15 HISTORY — DX: Anemia, unspecified: D64.9

## 2021-04-15 LAB — COMPREHENSIVE METABOLIC PANEL
ALT: 85 U/L — ABNORMAL HIGH (ref 0–44)
AST: 159 U/L — ABNORMAL HIGH (ref 15–41)
Albumin: 4.4 g/dL (ref 3.5–5.0)
Alkaline Phosphatase: 61 U/L (ref 38–126)
Anion gap: 17 — ABNORMAL HIGH (ref 5–15)
BUN: 8 mg/dL (ref 6–20)
CO2: 22 mmol/L (ref 22–32)
Calcium: 9.2 mg/dL (ref 8.9–10.3)
Chloride: 98 mmol/L (ref 98–111)
Creatinine, Ser: 0.54 mg/dL (ref 0.44–1.00)
GFR, Estimated: >60 mL/min (ref >60)
Glucose, Bld: 81 mg/dL (ref 70–99)
Potassium: 3.5 mmol/L (ref 3.5–5.1)
Sodium: 137 mmol/L (ref 135–145)
Total Bilirubin: 0.5 mg/dL (ref 0.3–1.2)
Total Protein: 6.7 g/dL (ref 6.5–8.1)

## 2021-04-15 LAB — CBC
HCT: 35.6 % — ABNORMAL LOW (ref 36.0–46.0)
Hemoglobin: 12 g/dL (ref 12.0–15.0)
MCH: 34.6 pg — ABNORMAL HIGH (ref 26.0–34.0)
MCHC: 33.7 g/dL (ref 30.0–36.0)
MCV: 102.6 fL — ABNORMAL HIGH (ref 80.0–100.0)
Platelets: 162 10*3/uL (ref 150–400)
RBC: 3.47 MIL/uL — ABNORMAL LOW (ref 3.87–5.11)
RDW: 13.3 % (ref 11.5–15.5)
WBC: 5.3 10*3/uL (ref 4.0–10.5)
nRBC: 0 % (ref 0.0–0.2)

## 2021-04-15 SURGERY — MRI WITH ANESTHESIA
Anesthesia: General

## 2021-04-15 MED ORDER — GADOBUTROL 1 MMOL/ML IV SOLN
4.0000 mL | Freq: Once | INTRAVENOUS | Status: AC | PRN
  Administered 2021-04-15: 4 mL via INTRAVENOUS

## 2021-04-15 MED ORDER — ROCURONIUM BROMIDE 10 MG/ML (PF) SYRINGE
PREFILLED_SYRINGE | INTRAVENOUS | Status: DC | PRN
  Administered 2021-04-15: 50 mg via INTRAVENOUS

## 2021-04-15 MED ORDER — CHLORHEXIDINE GLUCONATE 0.12 % MT SOLN: 15.0000 mL | Freq: Once | OROMUCOSAL | Status: AC

## 2021-04-15 MED ORDER — LIDOCAINE 2% (20 MG/ML) 5 ML SYRINGE
INTRAMUSCULAR | Status: DC | PRN
  Administered 2021-04-15: 20 mg via INTRAVENOUS

## 2021-04-15 MED ORDER — EPHEDRINE SULFATE-NACL 50-0.9 MG/10ML-% IV SOSY
PREFILLED_SYRINGE | INTRAVENOUS | Status: DC | PRN
  Administered 2021-04-15: 10 mg via INTRAVENOUS
  Administered 2021-04-15: 5 mg via INTRAVENOUS

## 2021-04-15 MED ORDER — CHLORHEXIDINE GLUCONATE 0.12 % MT SOLN
OROMUCOSAL | Status: AC
  Administered 2021-04-15: 15 mL via OROMUCOSAL
  Filled 2021-04-15: qty 15

## 2021-04-15 MED ORDER — SUGAMMADEX SODIUM 200 MG/2ML IV SOLN
INTRAVENOUS | Status: DC | PRN
  Administered 2021-04-15: 200 mg via INTRAVENOUS

## 2021-04-15 MED ORDER — LACTATED RINGERS IV SOLN: INTRAVENOUS | Status: DC

## 2021-04-15 MED ORDER — ONDANSETRON HCL 4 MG/2ML IJ SOLN
INTRAMUSCULAR | Status: DC | PRN
  Administered 2021-04-15: 4 mg via INTRAVENOUS

## 2021-04-15 MED ORDER — PROPOFOL 10 MG/ML IV BOLUS
INTRAVENOUS | Status: DC | PRN
  Administered 2021-04-15: 90 mg via INTRAVENOUS

## 2021-04-15 MED ORDER — MIDAZOLAM HCL 2 MG/2ML IJ SOLN
INTRAMUSCULAR | Status: DC | PRN
  Administered 2021-04-15: 1 mg via INTRAVENOUS

## 2021-04-15 MED ORDER — FENTANYL CITRATE (PF) 250 MCG/5ML IJ SOLN
INTRAMUSCULAR | Status: DC | PRN
  Administered 2021-04-15 (×2): 50 ug via INTRAVENOUS

## 2021-04-15 MED ORDER — ORAL CARE MOUTH RINSE: 15.0000 mL | Freq: Once | OROMUCOSAL | Status: AC

## 2021-04-15 MED ORDER — DEXAMETHASONE SODIUM PHOSPHATE 10 MG/ML IJ SOLN
INTRAMUSCULAR | Status: DC | PRN
  Administered 2021-04-15: 10 mg via INTRAVENOUS

## 2021-04-15 MED ORDER — PHENYLEPHRINE 40 MCG/ML (10ML) SYRINGE FOR IV PUSH (FOR BLOOD PRESSURE SUPPORT)
PREFILLED_SYRINGE | INTRAVENOUS | Status: DC | PRN
  Administered 2021-04-15 (×2): 200 ug via INTRAVENOUS

## 2021-04-15 NOTE — Anesthesia Procedure Notes (Signed)
Procedure Name: Intubation Date/Time: 04/15/2021 8:48 AM Performed by: Rosiland Oz, CRNA Pre-anesthesia Checklist: Patient identified, Emergency Drugs available, Suction available, Patient being monitored and Timeout performed Patient Re-evaluated:Patient Re-evaluated prior to induction Oxygen Delivery Method: Circle system utilized Preoxygenation: Pre-oxygenation with 100% oxygen Induction Type: IV induction Ventilation: Mask ventilation without difficulty Laryngoscope Size: Miller and 2 Grade View: Grade II Tube type: Oral Tube size: 7.0 mm Number of attempts: 1 Airway Equipment and Method: Stylet Placement Confirmation: ETT inserted through vocal cords under direct vision,  positive ETCO2 and breath sounds checked- equal and bilateral Secured at: 21 cm Dental Injury: Teeth and Oropharynx as per pre-operative assessment

## 2021-04-15 NOTE — Anesthesia Postprocedure Evaluation (Signed)
Anesthesia Post Note  Patient: Kelly Zuniga  Procedure(s) Performed: MRI/CT OF ABDOMEN WITH ANESTHESIA (N/A )     Patient location during evaluation: PACU Anesthesia Type: General Level of consciousness: awake and alert Pain management: pain level controlled Vital Signs Assessment: post-procedure vital signs reviewed and stable Respiratory status: spontaneous breathing, nonlabored ventilation, respiratory function stable and patient connected to nasal cannula oxygen Cardiovascular status: blood pressure returned to baseline and stable Postop Assessment: no apparent nausea or vomiting Anesthetic complications: no   No complications documented.  Last Vitals:  Vitals:   04/15/21 1000 04/15/21 1015  BP: (!) 145/76 (!) 144/73  Pulse: (!) 103 93  Resp: 13 13  Temp:  36.8 C  SpO2: 90% 94%    Last Pain:  Vitals:   04/15/21 1015  TempSrc:   PainSc: 0-No pain                 Shelton Silvas

## 2021-04-15 NOTE — Transfer of Care (Signed)
Immediate Anesthesia Transfer of Care Note  Patient: Kelly Zuniga  Procedure(s) Performed: MRI/CT OF ABDOMEN WITH ANESTHESIA (N/A )  Patient Location: PACU  Anesthesia Type:General  Level of Consciousness: drowsy and patient cooperative  Airway & Oxygen Therapy: Patient Spontanous Breathing and Patient connected to face mask oxygen  Post-op Assessment: Report given to RN and Post -op Vital signs reviewed and stable  Post vital signs: Reviewed and stable  Last Vitals:  Vitals Value Taken Time  BP 142/79 04/15/21 0947  Temp    Pulse 108 04/15/21 0949  Resp 12 04/15/21 0949  SpO2 96 % 04/15/21 0949  Vitals shown include unvalidated device data.  Last Pain:  Vitals:   04/15/21 0720  TempSrc:   PainSc: 0-No pain         Complications: No complications documented.

## 2021-04-15 NOTE — Interval H&P Note (Signed)
Anesthesia H&P Update: History and Physical Exam reviewed; patient is OK for planned anesthetic and procedure. ? ?

## 2021-04-15 NOTE — Anesthesia Preprocedure Evaluation (Addendum)
Anesthesia Evaluation  Patient identified by MRN, date of birth, ID band Patient awake    Reviewed: Allergy & Precautions, NPO status , Patient's Chart, lab work & pertinent test results  Airway Mallampati: II  TM Distance: >3 FB Neck ROM: Full    Dental  (+) Teeth Intact, Dental Advisory Given   Pulmonary asthma , Current Smoker,    breath sounds clear to auscultation       Cardiovascular hypertension,  Rhythm:Regular Rate:Normal     Neuro/Psych  Headaches, Seizures -,  PSYCHIATRIC DISORDERS Anxiety Depression    GI/Hepatic negative GI ROS, Neg liver ROS,   Endo/Other  negative endocrine ROS  Renal/GU negative Renal ROS     Musculoskeletal negative musculoskeletal ROS (+)   Abdominal Normal abdominal exam  (+)   Peds  Hematology negative hematology ROS (+)   Anesthesia Other Findings   Reproductive/Obstetrics                            Anesthesia Physical Anesthesia Plan  ASA: II  Anesthesia Plan: General   Post-op Pain Management:    Induction: Intravenous  PONV Risk Score and Plan: 3 and Ondansetron and Midazolam  Airway Management Planned: Oral ETT  Additional Equipment: None  Intra-op Plan:   Post-operative Plan: Extubation in OR  Informed Consent: I have reviewed the patients History and Physical, chart, labs and discussed the procedure including the risks, benefits and alternatives for the proposed anesthesia with the patient or authorized representative who has indicated his/her understanding and acceptance.     Dental advisory given  Plan Discussed with: CRNA  Anesthesia Plan Comments:        Anesthesia Quick Evaluation

## 2021-04-16 ENCOUNTER — Encounter (HOSPITAL_COMMUNITY): Payer: Self-pay | Admitting: Radiology

## 2021-04-17 ENCOUNTER — Ambulatory Visit: Payer: No Typology Code available for payment source

## 2021-04-17 ENCOUNTER — Telehealth: Payer: Self-pay | Admitting: Registered Nurse

## 2021-04-17 ENCOUNTER — Other Ambulatory Visit: Payer: Self-pay

## 2021-04-17 ENCOUNTER — Encounter: Payer: Self-pay | Admitting: Neurology

## 2021-04-17 ENCOUNTER — Ambulatory Visit (INDEPENDENT_AMBULATORY_CARE_PROVIDER_SITE_OTHER): Payer: No Typology Code available for payment source | Admitting: Neurology

## 2021-04-17 VITALS — BP 128/78 | HR 91 | Ht 60.0 in | Wt 97.0 lb

## 2021-04-17 DIAGNOSIS — R569 Unspecified convulsions: Secondary | ICD-10-CM

## 2021-04-17 MED ORDER — TOPIRAMATE 100 MG PO TABS: 100.0000 mg | ORAL_TABLET | Freq: Two times a day (BID) | ORAL | 6 refills | Status: DC

## 2021-04-17 NOTE — Progress Notes (Signed)
Chief Complaint  Patient presents with  . New Patient (Initial Visit)    ED follow up from 02/09/21. Reports having witnessed seizure-like activity while at work and taken to the hospital. She had MRI brain at hospital. Per chart, on return from MRI patient had a witnessed event where she was unresponsive, had abnormal movement, lateral gaze w/o generalized tonic-clonic movement. She remained post-ictal for 1 hr. She was placed on Keppra 500mg , BID. Says she had one other similar episode on 04/02/21 while she was alone. Denies any missed doses of medication.       ASSESSMENT AND PLAN  Kelly Zuniga is a 55 y.o. female   Sudden onset loss of consciousness  That was reported seizure-like activity,  MRI of the brain without contrast showed no acute intracranial abnormality, does have small right parietal scalp hematoma, mild generalized atrophy, cerebrovascular disease,  Differentiation diagnosis include seizure versus syncope  Cardiology appointment pending  Complete evaluation with EMG,  MRI of the brain with contrast  No driving until episode free for 6 months  History of alcohol abuse, depression, anxiety  On polypharmacy, includes Lexapro 10 mg, clonazepam 0.5 mg 3 times a day as needed Chronic migraine headaches,  Already on Topamax 25 mg twice daily as migraine prevention, tolerating it well, works very well for her migraine,  She was put on Keppra 500 mg twice a day since hospital admission in February 2022, will stop Keppra, titrating Topamax 100 mg twice daily   DIAGNOSTIC DATA (LABS, IMAGING, TESTING) - I reviewed patient records, labs, notes, testing and imaging myself where available.  MRI of the brain without contrast on February 09, 2021: 1. No acute intracranial abnormality. 2. Mild chronic small vessel ischemic disease and cerebral atrophy. 3. Small right parietal scalp hematoma.  Laboratory evaluations in 2022: CBC, hemoglobin of 12, CMP, creatinine of 0.5, normal  vitamin B1, B12, magnesium, LDH, BMP, prolactin, ferritin was mildly elevated at 340, negative HIV, hepatitis panel, lithium level was less than 0.06, alcohol level in 2014 was 259, in February 2022 was less than 10, UDS was negative, CPK 125, normal TSH, A1c 4.9   HISTORICAL  Kelly Zuniga, is a 55 year old female, seen in request by of primary care nurse practitioner 40, for evaluation of passing out spells, initial evaluation was on April 17, 2021  I reviewed and summarized the referring note. PMHX. Asthma Depression, anxiety,  Smoking History of alcohol abuse,    She currently works as of April 19, 2021 at [employer], on February 09, 2021, she was taken to hospital by ambulance, that morning she started working at 6 AM, about 10:30 AM, 30 minutes before the end of her shift, she remember hanging some cloth on the rack in a standing position, then she had sudden onset loss of consciousness, she denies warning signs, ambulance was called, there was reported seizure-like activity per ER record, she denies tongue biting, urinary incontinence,  Extensive evaluation at hospital, MRI of the brain without contrast showed no acute abnormality, mild atrophy, small vessel disease, small right parietal scalp hematoma  She was not able to go back to work ever since, patient reported 2 more episode of passing out, but was not able to elaborate on details, it happened without witness, 1 episode she woke up feel confused, seems to have a spell, the other episode she woke up fell to the ground for unknown length of time,  She had a history of alcohol abuse, 2014 hospital admission for alcohol cessation, alcohol level  was 259 at that time, at this time less than 10, she denied excessive alcohol use, only reported mild use, UDS was negative, laboratory evaluation showed no other treatable etiology,   Appointment with his cardiologist is pending may  She was given Keppra 500 mg grams twice daily since  hospital admission, tolerating medication well, long history of migraine headaches, taking Topamax 25 mg twice a day, tolerating it well, no longer has frequent headaches  REVIEW OF SYSTEMS:  Full 14 system review of systems performed and notable only for as above All other review of systems were negative.  PHYSICAL EXAM:   Vitals:   04/17/21 1356  BP: 128/78  Pulse: 91  Weight: 97 lb (44 kg)  Height: 5' (1.524 m)   Not recorded     Body mass index is 18.94 kg/m.  PHYSICAL EXAMNIATION:  Gen: NAD, conversant, well nourised, well groomed                     Cardiovascular: Regular rate rhythm, no peripheral edema, warm, nontender. Eyes: Conjunctivae clear without exudates or hemorrhage Neck: Supple, no carotid bruits. Pulmonary: Clear to auscultation bilaterally   NEUROLOGICAL EXAM:  MENTAL STATUS: Speech:    Speech is normal; fluent and spontaneous with normal comprehension.  Cognition:     Orientation to time, place and person     Normal recent and remote memory     Normal Attention span and concentration     Normal Language, naming, repeating,spontaneous speech     Fund of knowledge   CRANIAL NERVES: CN II: Visual fields are full to confrontation. Pupils are round equal and briskly reactive to light. CN III, IV, VI: extraocular movement are normal. No ptosis. CN V: Facial sensation is intact to light touch, bilateral corneal reflex was intact CN VII: Face is symmetric with normal eye closure, she tends to close her left eye during conversation, with distraction, she has fairly symmetric eye blinking movement CN VIII: Hearing is normal to causal conversation. CN IX, X: Phonation is normal. CN XI: Head turning and shoulder shrug are intact  MOTOR: There is no pronator drift of out-stretched arms. Muscle bulk and tone are normal. Muscle strength is normal.  REFLEXES: Reflexes are 2+ and symmetric at the biceps, triceps, knees, and ankles. Plantar responses are  flexor.  SENSORY: Intact to light touch, pinprick and vibratory sensation are intact in fingers and toes.  COORDINATION: There was no trunk ataxia or limb dysmetria, but patient has deliberate slow movement during examination  GAIT/STANCE: Need to push-up to get up from seated position, wide-based, cautious, relies on her cane   ALLERGIES: Allergies  Allergen Reactions  . Acetaminophen Other (See Comments)    Makes LFT get elevated and causes liver problems/issues   . Lithium Other (See Comments)    Causes the patient to have no appetite  . Promethazine-Codeine Nausea And Vomiting and Other (See Comments)    Any codeine-based cough syrups cause N/V  . Codeine Nausea And Vomiting  . Morphine And Related Nausea And Vomiting    HOME MEDICATIONS: Current Outpatient Medications  Medication Sig Dispense Refill  . albuterol (PROVENTIL) (2.5 MG/3ML) 0.083% nebulizer solution Take 2.5 mg by nebulization every 6 (six) hours as needed for wheezing or shortness of breath.    Marland Kitchen. albuterol (VENTOLIN HFA) 108 (90 Base) MCG/ACT inhaler Inhale 2 puffs into the lungs See admin instructions. Inhale 2 puffs into the lungs every 4-6 hours as needed for shortness of breath or  wheezing    . B Complex-C (SUPER B COMPLEX/VITAMIN C PO) Take 1 tablet by mouth daily. With Folic Acid    . BIOTIN PO Take 1 tablet by mouth in the morning.    . cetirizine (ZYRTEC) 10 MG tablet Take 10 mg by mouth daily.    . clonazePAM (KLONOPIN) 0.5 MG tablet Take 1 tablet (0.5 mg total) by mouth 3 (three) times daily as needed for anxiety. 90 tablet 2  . escitalopram (LEXAPRO) 10 MG tablet Take 10 mg by mouth 2 (two) times daily.    Marland Kitchen levETIRAcetam (KEPPRA) 500 MG tablet Take 1 tablet (500 mg total) by mouth 2 (two) times daily. 60 tablet 1  . magnesium gluconate (MAGONATE) 500 MG tablet Take 500 mg by mouth daily.    . Multiple Vitamins-Minerals (MULTI ADULT GUMMIES) CHEW Chew 2 tablets by mouth in the morning.    .  naproxen sodium (ALEVE) 220 MG tablet Take 220 mg by mouth 2 (two) times daily as needed (for mild pain).    Marland Kitchen thiamine 100 MG tablet Take 1 tablet (100 mg total) by mouth daily. 30 tablet 0  . topiramate (TOPAMAX) 25 MG tablet Take 1 tablet (25 mg total) by mouth 2 (two) times daily. 60 tablet 1  . triamcinolone (NASACORT) 55 MCG/ACT nasal inhaler Place 2 sprays into the nose daily. For allergies (Patient taking differently: Place 2 sprays into the nose in the morning.) 1 Inhaler 11   No current facility-administered medications for this visit.    PAST MEDICAL HISTORY: Past Medical History:  Diagnosis Date  . Alcohol abuse   . Allergy   . Anemia    low iron  . Anxiety   . Asthma   . Asthma    Phreesia 11/09/2020  . Depression    posibly bipolar disorder. Sees Dr [name] for psychiatric care and Dr [name] for therapy  . Depression    Phreesia 11/09/2020  . Headache(784.0)    last migraine 2004  . 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 Hypertension    never on meds   . Pneumonia    at 13 years  . Seizure (HCC) 02/09/2021  . Seizure (HCC)   . Substance abuse (HCC)    Phreesia 11/09/2020    PAST SURGICAL HISTORY: Past Surgical History:  Procedure Laterality Date  . benign cyst     removed from left cheek  . RADIOLOGY WITH ANESTHESIA N/A 04/15/2021   Procedure: MRI/CT OF ABDOMEN WITH ANESTHESIA;  Surgeon: Radiologist, Medication, MD;  Location: [facility] OR;  Service: Radiology;  Laterality: N/A;  . TONSILECTOMY, ADENOIDECTOMY, BILATERAL MYRINGOTOMY AND TUBES      FAMILY HISTORY: Family History  Problem Relation Age of Onset  . Coronary artery disease Other        1st degree relative female <50  . Depression Other        Family Hx  . Diabetes Other        Family Hx 1st degree relative  . Ovarian cancer Other        Family Hx   . Healthy Mother   . Diabetes Father   . Gout Father   . Hypertension Father   . Heart failure Father   . Cancer Father   . Heart attack  Father     SOCIAL HISTORY: Social History   Socioeconomic History  . Marital status: Significant Other    Spouse name: Not on file  . Number of children: 0  . Years of education:  college  . Highest education level: Professional school degree (e.g., MD, DDS, DVM, JD)  Occupational History  . Occupation: Occupational psychologist  Tobacco Use  . Smoking status: Current Every Day Smoker    Packs/day: 0.25    Types: Cigarettes  . Smokeless tobacco: Never Used  . Tobacco comment: Using the E cigarette to help her quit & still smoking  Vaping Use  . Vaping Use: Former  Substance and Sexual Activity  . Alcohol use: Yes    Comment: 1 glass of wine per day or less  . Drug use: No  . Sexual activity: Not on file  Other Topics Concern  . Not on file  Social History Narrative   Lives with significant other and his two children.   Right-handed.   No daily caffeine.   Social Determinants of Health   Financial Resource Strain: Not on file  Food Insecurity: Not on file  Transportation Needs: Not on file  Physical Activity: Not on file  Stress: Not on file  Social Connections: Not on file  Intimate Partner Violence: Not on file      Levert Feinstein, M.D. Ph.D.  [facility] [address], [address] [place], [place] [address] Ph: [phone] Fax: [phone]  CC:  Dietrich Pates, PA-C [address] [place],  [place] [address]  [name], NP

## 2021-04-17 NOTE — Telephone Encounter (Signed)
Patient would like someone to call her about the scan done on Tueday - she said she has looked in My Chart but doesn't know what she needs to do next.  Please advise

## 2021-04-22 ENCOUNTER — Ambulatory Visit: Payer: No Typology Code available for payment source | Attending: Internal Medicine

## 2021-04-22 ENCOUNTER — Ambulatory Visit: Payer: No Typology Code available for payment source

## 2021-04-22 ENCOUNTER — Other Ambulatory Visit: Payer: Self-pay

## 2021-04-22 ENCOUNTER — Telehealth: Payer: Self-pay

## 2021-04-22 ENCOUNTER — Telehealth: Payer: Self-pay | Admitting: Neurology

## 2021-04-22 VITALS — BP 118/70

## 2021-04-22 DIAGNOSIS — R41841 Cognitive communication deficit: Secondary | ICD-10-CM

## 2021-04-22 DIAGNOSIS — R4701 Aphasia: Secondary | ICD-10-CM

## 2021-04-22 DIAGNOSIS — M6281 Muscle weakness (generalized): Secondary | ICD-10-CM | POA: Diagnosis present

## 2021-04-22 DIAGNOSIS — R2689 Other abnormalities of gait and mobility: Secondary | ICD-10-CM | POA: Insufficient documentation

## 2021-04-22 DIAGNOSIS — R2681 Unsteadiness on feet: Secondary | ICD-10-CM | POA: Insufficient documentation

## 2021-04-22 NOTE — Therapy (Signed)
[facility] [address] [place], [name], [address] Phone: [phone]   Fax:  [phone]  Speech Language Pathology Treatment  Patient Details  Name: Kelly Zuniga MRN: [id number] Date of Birth: 1966/04/19 Referring Provider (SLP): Eulogio Bear, DO   Encounter Date: 04/22/2021   End of Session - 04/22/21 2305    Visit Number 10    Number of Visits 17    Date for SLP Re-Evaluation 06/02/21    SLP Start Time 1104    SLP Stop Time  1145    SLP Time Calculation (min) 41 min    Activity Tolerance Patient tolerated treatment well           Past Medical History:  Diagnosis Date  . Alcohol abuse   . Allergy   . Anemia    low iron  . Anxiety   . Asthma   . Asthma    Phreesia 11/09/2020  . Depression    posibly bipolar disorder. Sees Dr Cristy Friedlander for psychiatric care and Dr Apolonio Schneiders for therapy  . Depression    Phreesia 11/09/2020  . Headache(784.0)    last migraine 2004  . 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 Hypertension    never on meds   . Pneumonia    at 9 years  . Seizure ([place]) 02/09/2021  . Seizure ([place])   . Substance abuse ([place])    Phreesia 11/09/2020    Past Surgical History:  Procedure Laterality Date  . benign cyst     removed from left cheek  . RADIOLOGY WITH ANESTHESIA N/A 04/15/2021   Procedure: MRI/CT OF ABDOMEN WITH ANESTHESIA;  Surgeon: Radiologist, Medication, MD;  Location: [facility];  Service: Radiology;  Laterality: N/A;  . TONSILECTOMY, ADENOIDECTOMY, BILATERAL MYRINGOTOMY AND TUBES      There were no vitals filed for this visit.   Subjective Assessment - 04/22/21 1108    Subjective "I had a bad couple days - Sunday and Monday weren't so good. But today is better." SLP observed slight eyelid droop on lt. No other detectable lt facial or oral weakness in session.    Currently in Pain? No/denies                 ADULT SLP TREATMENT - 04/22/21 1109      General  Information   Behavior/Cognition Alert;Cooperative;Pleasant mood      Treatment Provided   Treatment provided Cognitive-Linquistic      Cognitive-Linquistic Treatment   Treatment focused on Cognition    Skilled Treatment "Checkup" the only incidence of anomia in the last 2 weeks. Pt "Dr. Melvyn Novas and Dr. Nicole Kindred are cognitive" is what pt was told when she called last week. SLP learned that pt must inquire other practices to obtain neuropsych therapy. Pt rates her speech 8/10. Pt asked SLP what he would rate her speech - SLP told pt he thought 9/10. SLP retested pt on the Cognitive Linguistic Quick Test subtests pt had more difficulty with 03-06-21 testing date. Pt scored worse on 2 of the three subtests today which is very peculiar if deficits are from a neuro basis, but could be understood from a psychological basis for pt's deficits, or an undetectable neuro event since last assessment. Dr. Krista Blue ordered for brain MRI to be repeated.      Assessment / Recommendations / Plan   Plan Continue with current plan of care      Progression Toward Goals   Progression toward goals Progressing toward goals   ?  mostly psych component or undetectable neuro event since 03-06-21             SLP Short Term Goals - 04/03/21 1228      SLP SHORT TERM GOAL #1   Title pt will report successful usage of a med tracking system between 3 sessions    Baseline 03-25-21 (Alexa timer),    Period --   or 9 sessions, for all STGs   Status Partially Met      SLP SHORT TERM GOAL #2   Title pt will demonstrate WNL verbal expression in 15 minutes mod complex conversation in 4 sessions using compensations PRN    Baseline 03-27-21    Status Partially Met      SLP SHORT TERM GOAL #3   Title pt will complete cognitive assessment in the first two therapy sessions    Period --   sessions   Status Achieved            SLP Long Term Goals - 04/22/21 2310      SLP LONG TERM GOAL #1   Title pt will demonstrate WNL verbal  expression in 15 minutes mod complex-complex conversation in 4 sessions using compensations PRN    Baseline 04-03-21, 04/08/21    Time 3    Period Weeks   or 17 total sesisons, for all LTGs   Status On-going      SLP LONG TERM GOAL #2   Title pt will indicate a frustration level regarding aonmia which does not debilitate further conversation    Baseline 04-03-21    Status Achieved            Plan - 04/22/21 2305    Clinical Impression Statement Lerae presents again today with speech and language WFL/WNL - extremely mild and fleeting hesitation x2 this session. No anomia observed during session. SLP retested cognitive subtests which were most problematic but overall defiict was not highlighted on CLQT. Today, pt scored worse on 2 of the 3 subtests and one was primarily unchanged. This is pectuliar and suggests pt feigning deficits, deficits are psychological in nature, or pt experienced undetectable neuro event since assessment was completed on 03-06-21. SLP cont to postulate that Aurore's deficts are primarily psychologically based at this time. SLP suspects pt will be d/c'd in at least 1 and as many as 3 sessions.    Speech Therapy Frequency 2x / week    Duration 8 weeks   or 17 total visits   Treatment/Interventions Environmental controls;Functional tasks;Compensatory techniques;SLP instruction and feedback;Multimodal communcation approach;Language facilitation;Cueing hierarchy;Cognitive reorganization;Patient/family education;Internal/external aids    Potential to Achieve Goals Good    SLP Home Exercise Plan provided    Consulted and Agree with Plan of Care Patient           Patient will benefit from skilled therapeutic intervention in order to improve the following deficits and impairments:   Aphasia  Cognitive communication deficit    Problem List Patient Active Problem List   Diagnosis Date Noted  . Seizure-like activity ([place]) 04/17/2021  . Hypomagnesemia 02/10/2021  .  Generalized anxiety disorder 02/10/2021  . Insomnia 02/10/2021  . Fall due to seizure ([place]) 02/10/2021  . Head injury 02/10/2021  . Macrocytic anemia 02/10/2021  . Seizure as late effect of cerebrovascular accident (CVA) ([place]) 02/10/2021  . Seizure ([place]) 02/09/2021  . Elevated LFTs 02/09/2021  . Substance abuse ([place]) 02/18/2013  . Alcohol dependence ([place]) 02/18/2013  . Headache disorder 02/26/2012  . 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  MDD (major depressive disorder) 12/26/2010  . HTN (hypertension) 12/26/2010  . ALLERGIC RHINITIS 12/26/2010  . Asthma 12/26/2010    [place] ,[place], [place]  04/22/2021, 11:10 PM  [facility] [address] [place], [name], [address] Phone: [phone]   Fax:  [phone]   Name: Kiesha Ensey MRN: [id number] Date of Birth: 01-27-1966

## 2021-04-22 NOTE — Telephone Encounter (Signed)
PHCS auth: NPR ref # [id number] on 04/22/21 order sent to GI. They will reach out to the patient to schedule.

## 2021-04-22 NOTE — Therapy (Signed)
[facility] [address] [place], [name], [address] Phone: [phone]   Fax:  [phone]  Physical Therapy Treatment  Patient Details  Name: Kelly Zuniga MRN: [id number] Date of Birth: 02-Apr-1966 Referring Provider (PT): Referred by Eulogio Bear (hospitalist) but PCP is Maximiano Coss.   Encounter Date: 04/22/2021   PT End of Session - 04/22/21 1151    Visit Number 10    Number of Visits 17    PT Start Time 1572    PT Stop Time 1230    PT Time Calculation (min) 42 min    Equipment Utilized During Treatment Gait belt    Activity Tolerance Patient tolerated treatment well    Behavior During Therapy Anxious           Past Medical History:  Diagnosis Date  . Alcohol abuse   . Allergy   . Anemia    low iron  . Anxiety   . Asthma   . Asthma    Phreesia 11/09/2020  . Depression    posibly bipolar disorder. Sees Dr Cristy Friedlander for psychiatric care and Dr Apolonio Schneiders for therapy  . Depression    Phreesia 11/09/2020  . Headache(784.0)    last migraine 2004  . 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 Hypertension    never on meds   . Pneumonia    at 14 years  . Seizure ([place]) 02/09/2021  . Seizure ([place])   . Substance abuse ([place])    Phreesia 11/09/2020    Past Surgical History:  Procedure Laterality Date  . benign cyst     removed from left cheek  . RADIOLOGY WITH ANESTHESIA N/A 04/15/2021   Procedure: MRI/CT OF ABDOMEN WITH ANESTHESIA;  Surgeon: Radiologist, Medication, MD;  Location: [facility];  Service: Radiology;  Laterality: N/A;  . TONSILECTOMY, ADENOIDECTOMY, BILATERAL MYRINGOTOMY AND TUBES      Vitals:   04/22/21 1152  BP: 118/70     Subjective Assessment - 04/22/21 1149    Subjective Patient reports that the abdominal MRI showed a stone in the common bile duct. Dr. Krista Blue is going to order another brain MRI. Keppra was stopped but Topomax was increased. Also cardiologist 5/21. She also has a EEG  scheduled. Pt reports that she had a migraine yesterday so feeling a little less steady. Reports 1 butt sit fall where she got wobbly and sat down on carpet last Saturday. No injuries. PT did note slight droop of left eye. Smile intact and able to stick tongue out straight and purse lips well.    Pertinent History 55 y.o. female with medical history significant of alcohol abuse, astma,anxiety/depression, HTN, holmes-adie syndrome reprts 3-4 days of room spinning dizziness. At work today she had continued dizziness, hand cramping and weakness. She had a brief episode of LOC falling and striking posterior occiput. Coworkers witness some whole body shaking lasting several seconds. EMS activated and patient transported to [facility] for evaluation.   CT head w/o acute findings, MRI brain w/o acute findings.    Patient Stated Goals Pt wants to improve her balance and be able to bend over and sit to without feeling like she will fall. Also wants to build up her strength.    Currently in Pain? No/denies    Pain Onset In the past 7 days    Pain Onset In the past 7 days    Pain Onset In the past 7 days  [facility] Adult PT Treatment/Exercise - 04/22/21 1157      Ambulation/Gait   Ambulation/Gait Yes    Ambulation/Gait Assistance 5: Supervision;4: Min guard    Ambulation/Gait Assistance Details Pt does lean to right at times with slight stagger needing CGA a couple times.    Ambulation Distance (Feet) 850 Feet   115' on level inside without AD with less right lean noted.   Assistive device Straight cane    Gait Pattern Step-through pattern;Decreased step length - right;Decreased step length - left;Decreased arm swing - left    Ambulation Surface Level;Unlevel;Indoor;Outdoor;Paved;Grass      Neuro Re-ed    Neuro Re-ed Details  Gait activities over floor ladder: reciprocal steps x 4 bouts, marching steps x 4 bouts with CGA/min assist at times as less steady on RLE and got  anxious, side stepping x 4 bouts with verbal cues to keep feet straight. Corner balance activities on airex: feet together x 30 sec eyes open, 30 sec eyes closed, head turns left/right x 10, up/down x 10 close SBA.                    PT Short Term Goals - 04/03/21 1113      PT SHORT TERM GOAL #1   Title Pt will be independent with initial HEP for strength and balance.    Time 4    Period Weeks    Status Achieved    Target Date 04/04/21      PT SHORT TERM GOAL #2   Title Pt will decrease  5 x sit to stand from 51.87 sec to <40 sec for improved balance and functional strength.    Baseline 03/04/21 51.87 sec from chair without hands, 04/03/21 13.15 sec from chair without hands    Time 4    Period Weeks    Status Achieved    Target Date 04/04/21      PT SHORT TERM GOAL #3   Title Pt will increase Berg from 42 to >45/56 for improved balance and decreased fall risk.    Baseline 03/04/21 45/56. 04/01/21 43/56    Time 4    Period Weeks    Status Not Met    Target Date 04/04/21      PT SHORT TERM GOAL #4   Title Pt will ambulate >300' on level surfaces with LRAD mod I for improved mobility in home and short community distances.    Baseline 345' with cane mod I    Time 4    Period Weeks    Status Achieved    Target Date 04/04/21             PT Long Term Goals - 04/03/21 1114      PT LONG TERM GOAL #1   Title Pt will be independent with progressive HEP for strength and balance to continue gains on own.    Time 8    Period Weeks    Status New      PT LONG TERM GOAL #2   Title Pt will decrease 5 x sit to stand  to <30 sec for improved balance and functional strength.    Baseline 03/04/21 51.87 sec from chair with hands. 04/03/21 13.15 sec    Time 8    Period Weeks    Status Achieved      PT LONG TERM GOAL #3   Title Pt will increase gait speed from 0.37ms to >0.847m for improved gait safety in the community.  Baseline 03/04/21 0.77ms with walker    Time 8     Period Weeks    Status New      PT LONG TERM GOAL #4   Title Pt will increase Berg to >50/56 for improved balance and decreased fall risk.    Baseline 03/04/21 42/56    Time 8    Period Weeks    Status New      PT LONG TERM GOAL #5   Title Pt will ambulate up/down step in reciprocal pattern with 1 rail mod I for improved access to home.    Time 8    Period Weeks    Status New                 Plan - 04/22/21 1859    Clinical Impression Statement Pt seemed to have more staggering/right lean with use of cane than without. Discussed being sure she is just using cane for balance and not leaning on it. Pt does have decreased stability at times when appears more anxious. Did better with head turns on compliant surface today.    Personal Factors and Comorbidities Comorbidity 3+    Comorbidities alcohol abuse, astma,anxiety/depression, HTN, holmes-adie syndrome    Examination-Activity Limitations Locomotion Level;Transfers;Stairs;Stand    Examination-Participation Restrictions Community Activity;Occupation;Cleaning    Stability/Clinical Decision Making Evolving/Moderate complexity    Rehab Potential Good    PT Frequency 2x / week   plus eval   PT Duration 8 weeks    PT Treatment/Interventions ADLs/Self Care Home Management;DME Instruction;Gait training;Stair training;Functional mobility training;Therapeutic activities;Therapeutic exercise;Balance training;Neuromuscular re-education;Patient/family education;Vestibular;Visual/perceptual remediation/compensation    PT Next Visit Plan Pt has high anxiety. Continue to progress gait training without AD more. Gait with head turns. I cleared just to go without around home currently as long as she is feeling good. Continue balance activities on compliant surface, narrow BOS. negotiating over obstacles. Only scheduled through cert end of next week. If progressing do we need to add a little longer?    Consulted and Agree with Plan of Care Patient            Patient will benefit from skilled therapeutic intervention in order to improve the following deficits and impairments:  Abnormal gait,Decreased balance,Decreased cognition,Decreased mobility,Decreased strength,Decreased coordination  Visit Diagnosis: Other abnormalities of gait and mobility  Muscle weakness (generalized)  Unsteadiness on feet     Problem List Patient Active Problem List   Diagnosis Date Noted  . Se[place]-like activity ([place] 04/17/2021  . Hypomagnesemia 02/10/2021  . Generalized anxiety disorder 02/10/2021  . Insomnia 02/10/2021  . Fall due to seizure ([place] 02/10/2021  . Head injury 02/10/2021  . Macrocytic anemia 02/10/2021  . Seizure as late effect of cerebro[place] accident (CVA) ([place] 02/10/2021  . Seizure ([place] 02/09/2021  . Elevated LFTs 02/09/2021  . Substance abuse ([place] 02/18/2013  . Alcohol dependence ([place] 02/18/2013  . Headache disorder 02/26/2012  . 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 MDD (major depressive disorder) 12/26/2010  . HTN (hypertension) 12/26/2010  . ALLERGIC RHINITIS 12/26/2010  . Asthma 12/26/2010    EElecta Sniff PT, DPT, NCS 04/22/2021, 7:02 PM  [facility] [address]Phone: [phone]  Fax:  [phone] Name: TCeili BoshersMRN: [id number] of Birth: [birth year]

## 2021-04-22 NOTE — Telephone Encounter (Signed)
Type of forms received:FMLA  Individual made aware of 3-5 business day turn around (Y/N):  Faxed to : Leonie Douglas [phone]  Form location: Located in folder up front

## 2021-04-24 ENCOUNTER — Ambulatory Visit: Payer: No Typology Code available for payment source

## 2021-04-24 NOTE — Telephone Encounter (Signed)
I have received patients paperwork from up front.

## 2021-04-29 ENCOUNTER — Ambulatory Visit: Payer: No Typology Code available for payment source

## 2021-04-29 ENCOUNTER — Other Ambulatory Visit: Payer: Self-pay

## 2021-04-29 DIAGNOSIS — R41841 Cognitive communication deficit: Secondary | ICD-10-CM

## 2021-04-29 DIAGNOSIS — R2689 Other abnormalities of gait and mobility: Secondary | ICD-10-CM | POA: Diagnosis not present

## 2021-04-29 DIAGNOSIS — R2681 Unsteadiness on feet: Secondary | ICD-10-CM

## 2021-04-29 DIAGNOSIS — R4701 Aphasia: Secondary | ICD-10-CM

## 2021-04-29 NOTE — Therapy (Signed)
[facility] [address] [place], [name], [address] Phone: [phone]   Fax:  [phone]  Physical Therapy Treatment  Patient Details  Name: Kelly Zuniga MRN: [id number] Date of Birth: Feb 16, 1966 Referring Provider (PT): Referred by Eulogio Bear (hospitalist) but PCP is Maximiano Coss.   Encounter Date: 04/29/2021   PT End of Session - 04/29/21 1105    Visit Number 11    Number of Visits 17    PT Start Time 1104    PT Stop Time 1143    PT Time Calculation (min) 39 min    Equipment Utilized During Treatment Gait belt    Activity Tolerance Patient tolerated treatment well    Behavior During Therapy Anxious           Past Medical History:  Diagnosis Date  . Alcohol abuse   . Allergy   . Anemia    low iron  . Anxiety   . Asthma   . Asthma    Phreesia 11/09/2020  . Depression    posibly bipolar disorder. Sees Dr Cristy Friedlander for psychiatric care and Dr Apolonio Schneiders for therapy  . Depression    Phreesia 11/09/2020  . Headache(784.0)    last migraine 2004  . 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 Hypertension    never on meds   . Pneumonia    at 64 years  . Seizure ([place]) 02/09/2021  . Seizure ([place])   . Substance abuse ([place])    Phreesia 11/09/2020    Past Surgical History:  Procedure Laterality Date  . benign cyst     removed from left cheek  . RADIOLOGY WITH ANESTHESIA N/A 04/15/2021   Procedure: MRI/CT OF ABDOMEN WITH ANESTHESIA;  Surgeon: Radiologist, Medication, MD;  Location: [facility];  Service: Radiology;  Laterality: N/A;  . TONSILECTOMY, ADENOIDECTOMY, BILATERAL MYRINGOTOMY AND TUBES      There were no vitals filed for this visit.   Subjective Assessment - 04/29/21 1105    Subjective Patient reports she had a really bad migraine last Thursday which was why she cancelled. Also had one yesterday. Is feeling better today.    Pertinent History 55 y.o. female with medical history significant of  alcohol abuse, astma,anxiety/depression, HTN, holmes-adie syndrome reprts 3-4 days of room spinning dizziness. At work today she had continued dizziness, hand cramping and weakness. She had a brief episode of LOC falling and striking posterior occiput. Coworkers witness some whole body shaking lasting several seconds. EMS activated and patient transported to [facility] for evaluation.   CT head w/o acute findings, MRI brain w/o acute findings.    Patient Stated Goals Pt wants to improve her balance and be able to bend over and sit to without feeling like she will fall. Also wants to build up her strength.    Currently in Pain? No/denies    Pain Onset In the past 7 days    Pain Onset In the past 7 days    Pain Onset In the past 7 days                             [facility] Adult PT Treatment/Exercise - 04/29/21 1106      Transfers   Five time sit to stand comments  10.59 sec from chair without hands      Ambulation/Gait   Ambulation/Gait Yes    Ambulation/Gait Assistance 5: Supervision    Ambulation Distance (Feet) 230 Feet  Assistive device Straight cane    Gait Pattern Step-through pattern    Ambulation Surface Level;Indoor    Gait velocity 10.31 sec=0.42ms    Stairs Yes    Stairs Assistance 6: Modified independent (Device/Increase time)    Stair Management Technique One rail Left;Alternating pattern    Number of Stairs 8    Height of Stairs 6      Standardized Balance Assessment   Standardized Balance Assessment Berg Balance Test      Berg Balance Test   Sit to Stand Able to stand without using hands and stabilize independently    Standing Unsupported Able to stand safely 2 minutes    Sitting with Back Unsupported but Feet Supported on Floor or Stool Able to sit safely and securely 2 minutes    Stand to Sit Sits safely with minimal use of hands    Transfers Able to transfer safely, minor use of hands    Standing Unsupported with Eyes Closed Able to stand 10 seconds  safely    Standing Ubsupported with Feet Together Able to place feet together independently and stand for 1 minute with supervision   pt had increased shaking and verbalizing feeling really wobbly   From Standing, Reach Forward with Outstretched Arm Can reach forward >12 cm safely (5")    From Standing Position, Pick up Object from Floor Able to pick up shoe safely and easily    From Standing Position, Turn to Look Behind Over each Shoulder Needs supervision when turning    Turn 360 Degrees Able to turn 360 degrees safely but slowly    Standing Unsupported, Alternately Place Feet on Step/Stool Able to complete 4 steps without aid or supervision    Standing Unsupported, One Foot in Front Able to plae foot ahead of the other independently and hold 30 seconds    Standing on One Leg Tries to lift leg/unable to hold 3 seconds but remains standing independently    Total Score 43      Neuro Re-ed    Neuro Re-ed Details  At bottom step: step-ups without UE support x 10 each leg. Pt with increased shakiness and difficulty initiating step down with instability at times CGA with occasional min assist with stepping back. Walking over blue mat by counter: marching gait 8' x 6, tandem gait 8' x 4 with CGA and occasional counter support with decreased stability, standing in tandem on mat 30 sec each position with having to get set first. Pt did better with RLE posterior. She does have increased movements at times with arms flailing. Verbal cues to engage core to help.                  PT Education - 04/29/21 1952    Education Details Discussed results of BMerrilee Janskyand other testing today. Plan to recert next visit 2w2, 1w6.    Person(s) Educated Patient    Methods Explanation    Comprehension Verbalized understanding            PT Short Term Goals - 04/03/21 1113      PT SHORT TERM GOAL #1   Title Pt will be independent with initial HEP for strength and balance.    Time 4    Period Weeks     Status Achieved    Target Date 04/04/21      PT SHORT TERM GOAL #2   Title Pt will decrease  5 x sit to stand from 51.87 sec to <40 sec for improved  balance and functional strength.    Baseline 03/04/21 51.87 sec from chair without hands, 04/03/21 13.15 sec from chair without hands    Time 4    Period Weeks    Status Achieved    Target Date 04/04/21      PT SHORT TERM GOAL #3   Title Pt will increase Berg from 42 to >45/56 for improved balance and decreased fall risk.    Baseline 03/04/21 45/56. 04/01/21 43/56    Time 4    Period Weeks    Status Not Met    Target Date 04/04/21      PT SHORT TERM GOAL #4   Title Pt will ambulate >300' on level surfaces with LRAD mod I for improved mobility in home and short community distances.    Baseline 345' with cane mod I    Time 4    Period Weeks    Status Achieved    Target Date 04/04/21             PT Long Term Goals - 04/29/21 1117      PT LONG TERM GOAL #1   Title Pt will be independent with progressive HEP for strength and balance to continue gains on own.    Time 8    Period Weeks    Status New      PT LONG TERM GOAL #2   Title Pt will decrease 5 x sit to stand  to <30 sec for improved balance and functional strength.    Baseline 03/04/21 51.87 sec from chair with hands. 04/03/21 13.15 sec, 04/29/21 10.59 sec    Time 8    Period Weeks    Status Achieved      PT LONG TERM GOAL #3   Title Pt will increase gait speed from 0.65ms to >0.859m for improved gait safety in the community.    Baseline 03/04/21 0.6756mwith walker. 04/29/21 0.57m44mith cane    Time 8    Period Weeks    Status Achieved      PT LONG TERM GOAL #4   Title Pt will increase Berg to >50/56 for improved balance and decreased fall risk.    Baseline 03/04/21 42/56. 04/29/21 43/56    Time 8    Period Weeks    Status Not Met      PT LONG TERM GOAL #5   Title Pt will ambulate up/down step in reciprocal pattern with 1 rail mod I for improved access to home.     Baseline 04/29/21 8 steps with left rail in reciprocal pattern mod I    Time 8    Period Weeks    Status Achieved                 Plan - 04/29/21 1953    Clinical Impression Statement PT started to check LTGs today. Pt showing good progress. She met 5 x sit to stand goal decreasing time to 10.59 sec. Also met gait speed goal increasing speed with cane to 0.57m/37mth more fluid gait on level surfaces. She was able to negotiate steps with 1 rail with reciprocal pattern. Pt did not meet Berg goal with score of 43/56 which is only 1 point increase since last assessed. Still high fall risk based on this. She continues to show varied performance with balance activities with increased movements and poor stability at times. Pt will benefit from continued PT to continue to work more on her balance.    Personal Factors and  Comorbidities Comorbidity 3+    Comorbidities alcohol abuse, astma,anxiety/depression, HTN, holmes-adie syndrome    Examination-Activity Limitations Locomotion Level;Transfers;Stairs;Stand    Examination-Participation Restrictions Community Activity;Occupation;Cleaning    Stability/Clinical Decision Making Evolving/Moderate complexity    Rehab Potential Good    PT Frequency 2x / week   plus eval   PT Duration 8 weeks    PT Treatment/Interventions ADLs/Self Care Home Management;DME Instruction;Gait training;Stair training;Functional mobility training;Therapeutic activities;Therapeutic exercise;Balance training;Neuromuscular re-education;Patient/family education;Vestibular;Visual/perceptual remediation/compensation    PT Next Visit Plan Recert next visit 2x/week for 2 weeks and 1x/wk for 6 weeks. Check HEP goal. Will plan to continue Berg goal. Perform DGI or FGA next visit to add new goal. Pt has high anxiety. Continue to progress gait training without AD more. Gait with head turns.  Continue balance activities on compliant surface, narrow BOS. negotiating over obstacles.     Consulted and Agree with Plan of Care Patient           Patient will benefit from skilled therapeutic intervention in order to improve the following deficits and impairments:  Abnormal gait,Decreased balance,Decreased cognition,Decreased mobility,Decreased strength,Decreased coordination  Visit Diagnosis: Other abnormalities of gait and mobility  Unsteadiness on feet     Problem List Patient Active Problem List   Diagnosis Date Noted  . Seizure-like activity ([place]) 04/17/2021  . Hypomagnesemia 02/10/2021  . Generalized anxiety disorder 02/10/2021  . Insomnia 02/10/2021  . Fall due to seizure ([place]) 02/10/2021  . Head injury 02/10/2021  . Macrocytic anemia 02/10/2021  . Seizure as late effect of cerebrovascular accident (CVA) ([place]) 02/10/2021  . Seizure ([place]) 02/09/2021  . Elevated LFTs 02/09/2021  . Substance abuse ([place]) 02/18/2013  . Alcohol dependence ([place]) 02/18/2013  . Headache disorder 02/26/2012  . 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 MDD (major depressive disorder) 12/26/2010  . HTN (hypertension) 12/26/2010  . ALLERGIC RHINITIS 12/26/2010  . Asthma 12/26/2010    Electa Sniff, PT, DPT, NCS 04/29/2021, 7:59 PM  [facility] [address] [place], [name], [address] Phone: [phone]   Fax:  [phone]  Name: Kelly Zuniga MRN: [id number] Date of Birth: June 14, 1966

## 2021-04-29 NOTE — Patient Instructions (Signed)
   I'd like you to GRADUALLY work to doing your own medication in the medication box.  First, you and Hessie Diener switch spots on Sunday - you put the medication in the box, with Hessie Diener as the coach, using the medication sheet (directions) to coach you through filling up the med box. YOU look at the sheet and tell him what you're going to do with each medication, then do it. He WATCHES you to make sure you are doing it correctly. If you make a mistake he just tells you that you made a mistake - not telling you what the mistake was but letting you figure it out yourself with as little help as possible from him. Do this until you get it right 4-5 times in a row.   Second step - you fill the box on your own but Hessie Diener is sitting beside you doing a Sudoku, reading the news on his phone, etc... he answers questions (if you have any), and checks your work RIGHT AFTER you're done. Do this until you get it right 4-5 times in a row.  Last step - you fill the box, Hessie Diener is off doing something else, and he checks the box sometime that same day.  THIS IS GRADUAL and sets up some safe steps for you to be more independent with your medication.

## 2021-04-29 NOTE — Therapy (Signed)
[facility] [address] [place], [name], [address] Phone: [phone]   Fax:  [phone]  Speech Language Pathology Treatment  Patient Details  Name: Kelly Zuniga MRN: [id number] Date of Birth: October 24, 1966 Referring Provider (SLP): Eulogio Bear, DO   Encounter Date: 04/29/2021   End of Session - 04/29/21 1240    Visit Number 11    Number of Visits 17    Date for SLP Re-Evaluation 06/02/21    SLP Start Time 1148    SLP Stop Time  1230    SLP Time Calculation (min) 42 min    Activity Tolerance Patient tolerated treatment well           Past Medical History:  Diagnosis Date  . Alcohol abuse   . Allergy   . Anemia    low iron  . Anxiety   . Asthma   . Asthma    Phreesia 11/09/2020  . Depression    posibly bipolar disorder. Sees Dr Cristy Friedlander for psychiatric care and Dr Apolonio Schneiders for therapy  . Depression    Phreesia 11/09/2020  . Headache(784.0)    last migraine 2004  . 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 Hypertension    never on meds   . Pneumonia    at 75 years  . Seizure ([place]) 02/09/2021  . Seizure ([place])   . Substance abuse ([place])    Phreesia 11/09/2020    Past Surgical History:  Procedure Laterality Date  . benign cyst     removed from left cheek  . RADIOLOGY WITH ANESTHESIA N/A 04/15/2021   Procedure: MRI/CT OF ABDOMEN WITH ANESTHESIA;  Surgeon: Radiologist, Medication, MD;  Location: [facility];  Service: Radiology;  Laterality: N/A;  . TONSILECTOMY, ADENOIDECTOMY, BILATERAL MYRINGOTOMY AND TUBES      There were no vitals filed for this visit.   Subjective Assessment - 04/29/21 1150    Subjective "Last Thursday I had a horrible migraine - but my speech was great."    Currently in Pain? No/denies                 ADULT SLP TREATMENT - 04/29/21 1153      General Information   Behavior/Cognition Alert;Cooperative;Pleasant mood      Treatment Provided   Treatment provided  Cognitive-Linquistic      Cognitive-Linquistic Treatment   Treatment focused on Cognition;Aphasia    Skilled Treatment Pt speaking at [facility] [employer] [facility] pace and effort today in mod complex/complex conversation. Pt reports to SLP that she is afraid of filling her med box because she's afraid of making a mistake. SLP assisted pt in coming up wiht a plan to gradually make medication filling in the medbox an indpendent task (see pt instructions). Pt was pleased with this plan. She will cont at x1/week to review compensations like this with SLP for at least 2-4 more weeks.      Assessment / Recommendations / Plan   Plan --   decr frequency to once a week     Progression Toward Goals   Progression toward goals Progressing toward goals            SLP Education - 04/29/21 1239    Education Details gradual handoff of medication filling of medbox    Person(s) Educated Patient    Methods Explanation;Handout    Comprehension Verbalized understanding            SLP Short Term Goals - 04/03/21 1228      SLP SHORT TERM  GOAL #1   Title pt will report successful usage of a med tracking system between 3 sessions    Baseline 03-25-21 (Alexa timer),    Period --   or 9 sessions, for all STGs   Status Partially Met      SLP SHORT TERM GOAL #2   Title pt will demonstrate WNL verbal expression in 15 minutes mod complex conversation in 4 sessions using compensations PRN    Baseline 03-27-21    Status Partially Met      SLP SHORT TERM GOAL #3   Title pt will complete cognitive assessment in the first two therapy sessions    Period --   sessions   Status Achieved            SLP Long Term Goals - 04/29/21 1243      SLP LONG TERM GOAL #1   Title pt will demonstrate WNL verbal expression in 15 minutes mod complex-complex conversation in 4 sessions using compensations PRN    Baseline 04-03-21, 04/08/21, 04-29-21    Time 2    Period Weeks   or 17 total sesisons, for all LTGs   Status On-going      SLP LONG TERM  GOAL #2   Title pt will indicate a frustration level regarding aonmia which does not debilitate further conversation    Baseline 04-03-21    Status Achieved      SLP LONG TERM GOAL #3   Title pt will report more comfort in 3 tasks she was independent with (or in tasks she has not done but which she is confident she can become independent with) that she is able to do given compensations    Time 4    Period [place] - 04/29/21 1241    [place] presents again today with speech and language WFL/WNL. No anomia observed during session - pt wiht WNL speech rate. SLP reviewed the testing done last week and told pt it was peculiar and encouraged her to contact neuropsych. SLP cont to postulate that Kelly Zuniga's speaking deficts are primarily psychologically based at this time. With today's session in which pt was "scared" of filing her med box, SLP was able to assist with a gradual plan to make pt independent. Situations like these may be appropriate for SLP to assist pt with and pt will be seen for once/week for 4-6 weeks. D/C will likely occur sometime in this next 4-6 weeks.    Speech Therapy Frequency 1x /week    Duration 8 weeks   or 17 total visits   Treatment/Interventions Environmental controls;Functional tasks;Compensatory techniques;SLP instruction and feedback;Multimodal communcation approach;Language facilitation;Cueing hierarchy;Cognitive reorganization;Patient/family education;Internal/external aids    Potential to Achieve Goals Good    SLP Home Exercise Plan provided    Consulted and Agree with Plan of Care Patient           Patient will benefit from skilled therapeutic intervention in order to improve the following deficits and impairments:   Cognitive communication deficit  Aphasia    Problem List Patient Active Problem List   Diagnosis Date Noted  . Seizure-like activity ([place]) 04/17/2021  . Hypomagnesemia 02/10/2021  .  Generalized anxiety disorder 02/10/2021  . Insomnia 02/10/2021  . Fall due to seizure ([place]) 02/10/2021  . Head injury 02/10/2021  . Macrocytic anemia 02/10/2021  . Seizure as late effect of cerebrovascular accident (CVA) ([place]) 02/10/2021  .  Seizure ([place]) 02/09/2021  . Elevated LFTs 02/09/2021  . Substance abuse ([place]) 02/18/2013  . Alcohol dependence ([place]) 02/18/2013  . Headache disorder 02/26/2012  . 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 MDD (major depressive disorder) 12/26/2010  . HTN (hypertension) 12/26/2010  . ALLERGIC RHINITIS 12/26/2010  . Asthma 12/26/2010    [facility] ,[place], [place]  04/29/2021, 12:46 PM  [facility] [address] [place], [name], [address] Phone: [phone]   Fax:  [phone]   Name: Kelly Zuniga MRN: [id number] Date of Birth: [birth year]

## 2021-05-01 ENCOUNTER — Ambulatory Visit: Payer: No Typology Code available for payment source

## 2021-05-05 ENCOUNTER — Ambulatory Visit: Payer: No Typology Code available for payment source

## 2021-05-06 ENCOUNTER — Ambulatory Visit: Payer: No Typology Code available for payment source

## 2021-05-07 ENCOUNTER — Other Ambulatory Visit: Payer: No Typology Code available for payment source

## 2021-05-14 ENCOUNTER — Other Ambulatory Visit: Payer: Self-pay

## 2021-05-14 ENCOUNTER — Ambulatory Visit: Payer: No Typology Code available for payment source

## 2021-05-14 VITALS — BP 136/83 | HR 110

## 2021-05-14 DIAGNOSIS — R2689 Other abnormalities of gait and mobility: Secondary | ICD-10-CM

## 2021-05-14 DIAGNOSIS — R2681 Unsteadiness on feet: Secondary | ICD-10-CM

## 2021-05-14 DIAGNOSIS — M6281 Muscle weakness (generalized): Secondary | ICD-10-CM

## 2021-05-14 NOTE — Patient Instructions (Signed)
Access Code: [email or web address] URL: [email or web address] Date: 05/14/2021 Prepared by: Jethro Bastos  Exercises Sit to Stand on Foam Pad - 1 x daily - 7 x weekly - 2 sets - 10 reps Side Stepping with Resistance at Thighs and Counter Support - 1 x daily - 7 x weekly - 1 sets - 3 reps Walking March - 1 x daily - 7 x weekly - 1 sets - 3 reps Backward Walking with Counter Support - 1 x daily - 7 x weekly - 1 sets - 3 reps Romberg Stance Eyes Closed on Foam Pad - 1 x daily - 7 x weekly - 1 sets - 3 reps - 20 sec hold Romberg Stance on Foam Pad with Head Rotation - 1 x daily - 7 x weekly - 2 sets - 10 reps Wide Stance with Eyes Closed and Head Rotation on Foam Pad - 1 x daily - 7 x weekly - 2 sets - 10 reps

## 2021-05-14 NOTE — Therapy (Signed)
[facility] [address] [place], [name], [address] Phone: [phone]   Fax:  [phone]  Physical Therapy Treatment/Re-Certification  Patient Details  Name: Kelly Zuniga MRN: [id number] Date of Birth: July 26, 1966 Referring Provider (PT): Referred by Eulogio Bear (hospitalist) but PCP is Maximiano Coss.   Encounter Date: 05/14/2021   PT End of Session - 05/14/21 0804    Visit Number 12    Number of Visits 20    PT Start Time 0804    PT Stop Time 5427    PT Time Calculation (min) 40 min    Equipment Utilized During Treatment Gait belt    Activity Tolerance Patient tolerated treatment well    Behavior During Therapy Anxious           Past Medical History:  Diagnosis Date  . Alcohol abuse   . Allergy   . Anemia    low iron  . Anxiety   . Asthma   . Asthma    Phreesia 11/09/2020  . Depression    posibly bipolar disorder. Sees Dr Cristy Friedlander for psychiatric care and Dr Apolonio Schneiders for therapy  . Depression    Phreesia 11/09/2020  . Headache(784.0)    last migraine 2004  . 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 Hypertension    never on meds   . Pneumonia    at 20 years  . Seizure ([place]) 02/09/2021  . Seizure ([place])   . Substance abuse ([place])    Phreesia 11/09/2020    Past Surgical History:  Procedure Laterality Date  . benign cyst     removed from left cheek  . RADIOLOGY WITH ANESTHESIA N/A 04/15/2021   Procedure: MRI/CT OF ABDOMEN WITH ANESTHESIA;  Surgeon: Radiologist, Medication, MD;  Location: [facility];  Service: Radiology;  Laterality: N/A;  . TONSILECTOMY, ADENOIDECTOMY, BILATERAL MYRINGOTOMY AND TUBES      Vitals:   05/14/21 0808  BP: 136/83  Pulse: (!) 110     Subjective Assessment - 05/14/21 0807    Subjective Patient was exposed to COVID, but tested negative for the last week. Is feeling well. No other new changes.    Pertinent History 55 y.o. female with medical history significant of  alcohol abuse, astma,anxiety/depression, HTN, holmes-adie syndrome reprts 3-4 days of room spinning dizziness. At work today she had continued dizziness, hand cramping and weakness. She had a brief episode of LOC falling and striking posterior occiput. Coworkers witness some whole body shaking lasting several seconds. EMS activated and patient transported to [facility] for evaluation.   CT head w/o acute findings, MRI brain w/o acute findings.    Patient Stated Goals Pt wants to improve her balance and be able to bend over and sit to without feeling like she will fall. Also wants to build up her strength.    Currently in Pain? No/denies    Pain Onset In the past 7 days    Pain Onset In the past 7 days            [facility] PT Assessment - 05/14/21 0001      Assessment   Medical Diagnosis seizure    Referring Provider (PT) Referred by Eulogio Bear (hospitalist) but PCP is Maximiano Coss.    Onset Date/Surgical Date 02/09/21             [facility] Adult PT Treatment/Exercise - 05/14/21 0001      Transfers   Transfers Sit to Stand;Stand to Sit    Sit to Stand 5: Supervision  Stand to Sit 5: Supervision    Number of Reps 10 reps;1 set   with BLE on airex; supervision     Ambulation/Gait   Ambulation/Gait Yes    Ambulation/Gait Assistance 5: Supervision    Assistive device Straight cane    Gait Pattern Step-through pattern    Ambulation Surface Level;Indoor    Stairs Yes    Stairs Assistance 6: Modified independent (Device/Increase time)    Stair Management Technique One rail Left;Alternating pattern;No rails    Number of Stairs 8    Height of Stairs 6    Gait Comments increased challenge descening without rails, CGA required      Standardized Balance Assessment   Standardized Balance Assessment Dynamic Gait Index      Dynamic Gait Index   Level Surface Mild Impairment    Change in Gait Speed Mild Impairment    Gait with Horizontal Head Turns Moderate Impairment    Gait with Vertical  Head Turns Mild Impairment    Gait and Pivot Turn Normal    Step Over Obstacle Mild Impairment    Step Around Obstacles Normal    Steps Mild Impairment    Total Score 17      High Level Balance   High Level Balance Activities Side stepping;Backward walking;Marching forwards    High Level Balance Comments completed side stepping with progression to yellow TB x 2 laps down and back, cues for step length. Completed walking marching and backwards walking x 2 laps down and back. intermittent UE support required.               Balance Exercises - 05/14/21 0001      Balance Exercises: Standing   Standing Eyes Opened Narrow base of support (BOS);Head turns;Foam/compliant surface;Limitations    Standing Eyes Opened Limitations 1 x 10 reps with eyes open and narrow BOS    Standing Eyes Closed Narrow base of support (BOS);Wide (BOA);Head turns;Foam/compliant surface;3 reps;30 secs;Limitations    Standing Eyes Closed Limitations eyes closed 3 x 30 seconds with narrow BOS, wide BOS and eyes closed iwth horizontal head turns x 10 reps           Completed the following exercises and reviewed HEP, progressing to patient's tolerance. Bolded exercises are ones able to progress today during session.   Access Code: [email or web address] URL: [email or web address] Date: 05/14/2021 Prepared by: Baldomero Lamy  Exercises Sit to Stand on Foam Pad - 1 x daily - 7 x weekly - 2 sets - 10 reps Side Stepping with Resistance at Thighs and Counter Support - 1 x daily - 7 x weekly - 1 sets - 3 reps Walking March - 1 x daily - 7 x weekly - 1 sets - 3 reps Backward Walking with Counter Support - 1 x daily - 7 x weekly - 1 sets - 3 reps Romberg Stance Eyes Closed on Foam Pad - 1 x daily - 7 x weekly - 1 sets - 3 reps - 20 sec hold Romberg Stance on Foam Pad with Head Rotation - 1 x daily - 7 x weekly - 2 sets - 10 reps Wide Stance with Eyes Closed and Head Rotation on Foam Pad - 1 x daily - 7 x weekly - 2  sets - 10 reps    PT Education - 05/14/21 0804    Education Details progress toward LTG; Updated POC; Updated HEP    Person(s) Educated Patient    Methods Explanation    Comprehension Verbalized understanding  PT Short Term Goals - 04/03/21 1113      PT SHORT TERM GOAL #1   Title Pt will be independent with initial HEP for strength and balance.    Time 4    Period Weeks    Status Achieved    Target Date 04/04/21      PT SHORT TERM GOAL #2   Title Pt will decrease  5 x sit to stand from 51.87 sec to <40 sec for improved balance and functional strength.    Baseline 03/04/21 51.87 sec from chair without hands, 04/03/21 13.15 sec from chair without hands    Time 4    Period Weeks    Status Achieved    Target Date 04/04/21      PT SHORT TERM GOAL #3   Title Pt will increase Berg from 42 to >45/56 for improved balance and decreased fall risk.    Baseline 03/04/21 45/56. 04/01/21 43/56    Time 4    Period Weeks    Status Not Met    Target Date 04/04/21      PT SHORT TERM GOAL #4   Title Pt will ambulate >300' on level surfaces with LRAD mod I for improved mobility in home and short community distances.    Baseline 345' with cane mod I    Time 4    Period Weeks    Status Achieved    Target Date 04/04/21             PT Long Term Goals - 05/14/21 0844      PT LONG TERM GOAL #1   Title Pt will be independent with progressive HEP for strength and balance to continue gains on own.    Baseline reports independence; will benefit from progressive HEP    Time 8    Period Weeks    Status Achieved      PT LONG TERM GOAL #2   Title Pt will decrease 5 x sit to stand  to <30 sec for improved balance and functional strength.    Baseline 03/04/21 51.87 sec from chair with hands. 04/03/21 13.15 sec, 04/29/21 10.59 sec    Time 8    Period Weeks    Status Achieved      PT LONG TERM GOAL #3   Title Pt will increase gait speed from 0.49ms to >0.828m for improved gait safety  in the community.    Baseline 03/04/21 0.6775mwith walker. 04/29/21 0.43m77mith cane    Time 8    Period Weeks    Status Achieved      PT LONG TERM GOAL #4   Title Pt will increase Berg to >50/56 for improved balance and decreased fall risk.    Baseline 03/04/21 42/56. 04/29/21 43/56    Time 8    Period Weeks    Status Not Met      PT LONG TERM GOAL #5   Title Pt will ambulate up/down step in reciprocal pattern with 1 rail mod I for improved access to home.    Baseline 04/29/21 8 steps with left rail in reciprocal pattern mod I    Time 8    Period Weeks    Status Achieved           Updated Short Term Goals:   PT Short Term Goals - 05/14/21 1222      PT SHORT TERM GOAL #1   Title Pt will be independent with progressive HEP for strength and balance  Baseline continue to progress HEP    Time 3    Period Weeks    Status Revised    Target Date 06/04/21           Updated Long Term Goals:    PT Long Term Goals - 05/14/21 1224      PT LONG TERM GOAL #1   Title Pt will be independent with final HEP for strength and balance to continue gains on own. (All LTGs Due: 07/02/21)    Baseline reports independence; will benefit from progressive HEP    Time 7    Period Weeks    Status On-going    Target Date 07/02/21      PT LONG TERM GOAL #2   Title Pt will improve DGI to >/= 19/24 to indicate reduced fall risk    Baseline 17/24    Time 7    Period Weeks    Status New      PT LONG TERM GOAL #3   Title Pt will be able to ambulate x 115 ft with LRAD and scanning environment without LOB noted    Baseline increased challenge with horizontal head turns/scanning    Time 7    Period Weeks    Status New      PT LONG TERM GOAL #4   Title Pt will increase Berg to >50/56 for improved balance and decreased fall risk.    Baseline 03/04/21 42/56. 04/29/21 43/56    Time 7    Period Weeks    Status On-going               Plan - 05/14/21 0806    Clinical Impression Statement  Today's skilled PT session focused on assesment of LTGs. Patient able to meet LTG #1, 2, 3 and 5. Assessed DGI with patient scoring 17/24 indicating high fall risk. Patient is demonstrating progress with PT services and will continue to benefit to progress toward all unmet LTG. Reviewed and progressed HEP during session, with patient tolerating well.    Personal Factors and Comorbidities Comorbidity 3+    Comorbidities alcohol abuse, astma,anxiety/depression, HTN, holmes-adie syndrome    Examination-Activity Limitations Locomotion Level;Transfers;Stairs;Stand    Examination-Participation Restrictions Community Activity;Occupation;Cleaning    Stability/Clinical Decision Making Evolving/Moderate complexity    Rehab Potential Good    PT Frequency 2x / week    PT Duration --   for 1 week, followed by 1x/week for 6 weeks   PT Treatment/Interventions ADLs/Self Care Home Management;DME Instruction;Gait training;Stair training;Functional mobility training;Therapeutic activities;Therapeutic exercise;Balance training;Neuromuscular re-education;Patient/family education;Vestibular;Visual/perceptual remediation/compensation    PT Next Visit Plan How was HEP update? Pt has high anxiety. Continue to progress gait training without AD more. Gait with head turns.  Continue balance activities on compliant surface, narrow BOS. negotiating over obstacles.    Consulted and Agree with Plan of Care Patient           Patient will benefit from skilled therapeutic intervention in order to improve the following deficits and impairments:  Abnormal gait,Decreased balance,Decreased cognition,Decreased mobility,Decreased strength,Decreased coordination  Visit Diagnosis: Other abnormalities of gait and mobility  Unsteadiness on feet  Muscle weakness (generalized)     Problem List Patient Active Problem List   Diagnosis Date Noted  . Seizure-like activity ([place]) 04/17/2021  . Hypomagnesemia 02/10/2021  . Generalized  anxiety disorder 02/10/2021  . Insomnia 02/10/2021  . Fall due to seizure ([place]) 02/10/2021  . Head injury 02/10/2021  . Macrocytic anemia 02/10/2021  . 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 Seizure ([place]) 02/09/2021  . Elevated LFTs 02/09/2021  . Substance abuse ([place]) 02/18/2013  . Alcohol dependence ([place]) 02/18/2013  . Headache disorder 02/26/2012  . 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 MDD (major depressive disorder) 12/26/2010  . HTN (hypertension) 12/26/2010  . ALLERGIC RHINITIS 12/26/2010  . Asthma 12/26/2010    Jones Bales, PT, DPT 05/14/2021, 12:19 PM  [facility] [address] [place], [name], [address] Phone: [phone]   Fax:  [phone]  Name: Kelly Zuniga MRN: [id number] Date of Birth: [birth year]

## 2021-05-16 ENCOUNTER — Ambulatory Visit: Payer: No Typology Code available for payment source

## 2021-05-16 ENCOUNTER — Other Ambulatory Visit: Payer: Self-pay

## 2021-05-16 VITALS — BP 118/78

## 2021-05-16 DIAGNOSIS — R4701 Aphasia: Secondary | ICD-10-CM

## 2021-05-16 DIAGNOSIS — R2681 Unsteadiness on feet: Secondary | ICD-10-CM

## 2021-05-16 DIAGNOSIS — M6281 Muscle weakness (generalized): Secondary | ICD-10-CM

## 2021-05-16 DIAGNOSIS — R41841 Cognitive communication deficit: Secondary | ICD-10-CM

## 2021-05-16 DIAGNOSIS — R2689 Other abnormalities of gait and mobility: Secondary | ICD-10-CM

## 2021-05-16 NOTE — Therapy (Signed)
[facility] [address] [place], [name], [address] Phone: [phone]   Fax:  [phone]  Speech Language Pathology Treatment  Patient Details  Name: Kelly Zuniga MRN: [id number] Date of Birth: 10-21-1966 Referring Provider (SLP): Eulogio Bear, DO   Encounter Date: 05/16/2021   End of Session - 05/16/21 1225    Visit Number 12    Number of Visits 17    Date for SLP Re-Evaluation 06/02/21    SLP Start Time 0852    SLP Stop Time  0930    SLP Time Calculation (min) 38 min    Activity Tolerance Patient tolerated treatment well           Past Medical History:  Diagnosis Date  . Alcohol abuse   . Allergy   . Anemia    low iron  . Anxiety   . Asthma   . Asthma    Phreesia 11/09/2020  . Depression    posibly bipolar disorder. Sees Dr Cristy Friedlander for psychiatric care and Dr Apolonio Schneiders for therapy  . Depression    Phreesia 11/09/2020  . Headache(784.0)    last migraine 2004  . 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 Hypertension    never on meds   . Pneumonia    at 42 years  . Seizure ([place]) 02/09/2021  . Seizure ([place])   . Substance abuse ([place])    Phreesia 11/09/2020    Past Surgical History:  Procedure Laterality Date  . benign cyst     removed from left cheek  . RADIOLOGY WITH ANESTHESIA N/A 04/15/2021   Procedure: MRI/CT OF ABDOMEN WITH ANESTHESIA;  Surgeon: Radiologist, Medication, MD;  Location: [facility];  Service: Radiology;  Laterality: N/A;  . TONSILECTOMY, ADENOIDECTOMY, BILATERAL MYRINGOTOMY AND TUBES      There were no vitals filed for this visit.   Subjective Assessment - 05/16/21 0906    Subjective Pt arrived this morning requiring mod A to walk back to ST room. She walked out of ST room using single point cane without assist.    Patient is accompained by: Family member   Melissa-mother   Currently in Pain? Yes    Pain Score 4     Pain Location Back    Pain Orientation Lower    Pain  Descriptors / Indicators Sore                 ADULT SLP TREATMENT - 05/16/21 0913      General Information   Behavior/Cognition Alert;Cooperative;Pleasant mood      Treatment Provided   Treatment provided Cognitive-Linquistic      Cognitive-Linquistic Treatment   Treatment focused on Patient/family/caregiver education    Skilled Treatment Pt lt eye at various levels of being closed (intermittent variable ptosis) throughout session. No other unilateral motor neurological signs with pt's lips, cheeks, nares, or forehead. Although pt stated, "It feels like the left side of my face if falling off." When pt arrived ("I'm panicked.") she demonstrated significanlty halting speech - at least once every utterance. As SLP drew pt out she began talking more about her difficulties,  and Annabella and SLP worked through some things she could continue to try - writing things down, listening to music, etc. Additionally, SLP praised pt for putting her meds in her medbox correctly without husband's assistance and for catching an error with meds. Pt  and SLP also talked about two more activities she participated in that she felt more comfortable with her thinking skills.  As conversation progressed pt reported feeling less panicked and pt's speech differences improved significantly, with a halting speech incident occurring once every 6-7 minutes x2, then rarely in conversation after that point unless SLP began to talk about pt's home, and thoughts and feelings about deficits - then Teresia's speech fluidity declined again to halting every sentence. Pt had 3 word finding episodes during the session which she twice used descripiton strategy, successfully. The other time she waited for <3 seconds and she generated the word independently.      Assessment / Recommendations / Plan   Plan Continue with current plan of care      Progression Toward Goals   Progression toward goals Progressing toward goals               SLP Short Term Goals - 04/03/21 1228      SLP SHORT TERM GOAL #1   Title pt will report successful usage of a med tracking system between 3 sessions    Baseline 03-25-21 (Alexa timer),    Period --   or 9 sessions, for all STGs   Status Partially Met      SLP SHORT TERM GOAL #2   Title pt will demonstrate WNL verbal expression in 15 minutes mod complex conversation in 4 sessions using compensations PRN    Baseline 03-27-21    Status Partially Met      SLP SHORT TERM GOAL #3   Title pt will complete cognitive assessment in the first two therapy sessions    Period --   sessions   Status Achieved            SLP Long Term Goals - 05/16/21 1241      SLP LONG TERM GOAL #1   Title pt will demonstrate WNL verbal expression in 15 minutes mod complex-complex conversation in 4 sessions using compensations PRN    Baseline 04-03-21, 04/08/21, 04-29-21    Time 1    Period Weeks   or 17 total sesisons, for all LTGs   Status On-going      SLP LONG TERM GOAL #2   Title pt will indicate a frustration level regarding aonmia which does not debilitate further conversation    Baseline 04-03-21    Status Achieved      SLP LONG TERM GOAL #3   Title pt will report more comfort in 3 tasks she was independent with (or in tasks she has not done but which she is confident she can become independent with) that she is able to do given compensations    Status Achieved            Plan - 05/16/21 [place] presents again today with speech and language WFL/WNL. Three instances of anomia observed during session - and fluctuating level of fluency, but for 80% of session, fluency was WNL. SLP cont to postulate that Dawnna's deficits are either very mild in nature, and are exacerbated by psychological factors, or speech adn language deficits are based in psychological factors. Pt agreed it would be helpful for her the next 1-2 sessions to focus on relaxation strategies to  lessen impact of psychological factors on any speech/langauge symptoms.  D/C will likely occur sometime in the next 1-2 weeks.    Speech Therapy Frequency 1x /week    Duration 8 weeks   or 17 total visits   Treatment/Interventions Environmental controls;Functional tasks;Compensatory techniques;SLP instruction and feedback;Multimodal communcation approach;Language facilitation;Cueing hierarchy;Cognitive reorganization;Patient/family education;Internal/external aids  Potential to Achieve Goals Good    SLP Home Exercise Plan provided    Consulted and Agree with Plan of Care Patient           Patient will benefit from skilled therapeutic intervention in order to improve the following deficits and impairments:   No diagnosis found.    Problem List Patient Active Problem List   Diagnosis Date Noted  . Seizure-like activity ([place]) 04/17/2021  . Hypomagnesemia 02/10/2021  . Generalized anxiety disorder 02/10/2021  . Insomnia 02/10/2021  . Fall due to seizure ([place]) 02/10/2021  . Head injury 02/10/2021  . Macrocytic anemia 02/10/2021  . Seizure as late effect of cerebrovascular accident (CVA) ([place]) 02/10/2021  . Seizure ([place]) 02/09/2021  . Elevated LFTs 02/09/2021  . Substance abuse ([place]) 02/18/2013  . Alcohol dependence ([place]) 02/18/2013  . Headache disorder 02/26/2012  . 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 MDD (major depressive disorder) 12/26/2010  . HTN (hypertension) 12/26/2010  . ALLERGIC RHINITIS 12/26/2010  . Asthma 12/26/2010    [facility] ,[place], [place]  05/16/2021, 12:45 PM  [facility] [address] [place], [name], [address] Phone: [phone]   Fax:  [phone]   Name: Christyana Corwin MRN: [id number] Date of Birth: 20-Sep-1966

## 2021-05-16 NOTE — Therapy (Signed)
[facility] [address] [place], [place], [address] Phone: [phone]   Fax:  [phone]  Physical Therapy Treatment  Patient Details  Name: Kelly Zuniga MRN: [id number] Date of Birth: 02/09/1966 Referring Provider (PT): Referred by Kelly Zuniga (hospitalist) but PCP is Kelly Zuniga.   Encounter Date: 05/16/2021   PT End of Session - 05/16/21 0939    Visit Number 13    Number of Visits 20    PT Start Time 0937    PT Stop Time 1016    PT Time Calculation (min) 39 min    Equipment Utilized During Treatment Gait belt    Activity Tolerance Patient tolerated treatment well    Behavior During Therapy Anxious           Past Medical History:  Diagnosis Date  . Alcohol abuse   . Allergy   . Anemia    low iron  . Anxiety   . Asthma   . Asthma    Phreesia 11/09/2020  . Depression    posibly bipolar disorder. Sees Dr Kelly Zuniga for psychiatric care and Dr Kelly Zuniga for therapy  . Depression    Phreesia 11/09/2020  . Headache(784.0)    last migraine 2004  . 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 Hypertension    never on meds   . Pneumonia    at 13 years  . Seizure (HCC) 02/09/2021  . Seizure (HCC)   . Substance abuse (HCC)    Phreesia 11/09/2020    Past Surgical History:  Procedure Laterality Date  . benign cyst     removed from left cheek  . RADIOLOGY WITH ANESTHESIA N/A 04/15/2021   Procedure: MRI/CT OF ABDOMEN WITH ANESTHESIA;  Surgeon: Radiologist, Medication, MD;  Location: [facility] OR;  Service: Radiology;  Laterality: N/A;  . TONSILECTOMY, ADENOIDECTOMY, BILATERAL MYRINGOTOMY AND TUBES      Vitals:   05/16/21 0941  BP: 118/78     Subjective Assessment - 05/16/21 0939    Subjective Pt reports that she felt really bad this morning. She could hardly walk and was very unsteady. She was very panicked feeling. Reports that her speech wasn't great and was having trouble keeping left eye open. Her  mother is with her today in session. She had speech therapy prior and is feeling better now. She denies any headache or LOC. Pt had MRI on Sunday and she has EEG coming up as well.    Pertinent History 55 y.o. female with medical history significant of alcohol abuse, astma,anxiety/depression, HTN, holmes-adie syndrome reprts 3-4 days of room spinning dizziness. At work today she had continued dizziness, hand cramping and weakness. She had a brief episode of LOC falling and striking posterior occiput. Coworkers witness some whole body shaking lasting several seconds. EMS activated and patient transported to [facility] for evaluation.   CT head w/o acute findings, MRI brain w/o acute findings.    Patient Stated Goals Pt wants to improve her balance and be able to bend over and sit to without feeling like she will fall. Also wants to build up her strength.    Currently in Pain? No/denies    Pain Onset In the past 7 days    Pain Onset In the past 7 days                             [facility] Adult PT Treatment/Exercise - 05/16/21 0946      Ambulation/Gait  Ambulation/Gait Yes    Ambulation/Gait Assistance Details Pt had wide BOS and no arm swing with arms in high guard position. She would stop her progression randomly at times trying to get her balance.    Ambulation Distance (Feet) 115 Feet    Assistive device None    Gait Pattern Step-through pattern;Decreased arm swing - right;Decreased arm swing - left;Decreased step length - right;Decreased step length - left;Decreased trunk rotation    Ambulation Surface Level;Indoor      Neuro Re-ed    Neuro Re-ed Details  Along counter: marching forward and then backwards walking 8' x 4 each direction. Reciprocal stepping over floor ladder without UE support x 8 bouts, side stepping x 4 bouts. Pt had more difficulty with reciprocal steps and with side stepping to the right versus left. Pt needed CGA/min assist at times with activities with episodes of  "wobbliness". At mat: alternating toe taps on 2x4" beam without UE support, step-ups on beam with cane support x 5 each leg CGA/min assist occasionally. Standing on airex with feet apart: eyes open x  30 sec, eyes closed x 30 sec, head turns left/right and up/down x 10 each, marching in place x 10 CGA.                    PT Short Term Goals - 05/14/21 1222      PT SHORT TERM GOAL #1   Title Pt will be independent with progressive HEP for strength and balance    Baseline continue to progress HEP    Time 3    Period Weeks    Status Revised    Target Date 06/04/21             PT Long Term Goals - 05/14/21 1224      PT LONG TERM GOAL #1   Title Pt will be independent with final HEP for strength and balance to continue gains on own. (All LTGs Due: 07/02/21)    Baseline reports independence; will benefit from progressive HEP    Time 7    Period Weeks    Status On-going    Target Date 07/02/21      PT LONG TERM GOAL #2   Title Pt will improve DGI to >/= 19/24 to indicate reduced fall risk    Baseline 17/24    Time 7    Period Weeks    Status New      PT LONG TERM GOAL #3   Title Pt will be able to ambulate x 115 ft with LRAD and scanning environment without LOB noted    Baseline increased challenge with horizontal head turns/scanning    Time 7    Period Weeks    Status New      PT LONG TERM GOAL #4   Title Pt will increase Berg to >50/56 for improved balance and decreased fall risk.    Baseline 03/04/21 42/56. 04/29/21 43/56    Time 7    Period Weeks    Status On-going                 Plan - 05/16/21 1047    Clinical Impression Statement Pt was less stable with activities today with intermittent periods of "wobbliness". She had very wide BOS with gait.    Personal Factors and Comorbidities Comorbidity 3+    Comorbidities alcohol abuse, astma,anxiety/depression, HTN, holmes-adie syndrome    Examination-Activity Limitations Locomotion  Level;Transfers;Stairs;Stand    Examination-Participation Restrictions Community Activity;Occupation;Cleaning  Stability/Clinical Decision Making Evolving/Moderate complexity    Rehab Potential Good    PT Frequency 2x / week    PT Duration --   for 1 week, followed by 1x/week for 6 weeks   PT Treatment/Interventions ADLs/Self Care Home Management;DME Instruction;Gait training;Stair training;Functional mobility training;Therapeutic activities;Therapeutic exercise;Balance training;Neuromuscular re-education;Patient/family education;Vestibular;Visual/perceptual remediation/compensation    PT Next Visit Plan Pt has high anxiety. Continue to progress gait training without AD more. Gait with head turns.  Continue balance activities on compliant surface, narrow BOS. negotiating over obstacles. How did MRI go?    Consulted and Agree with Plan of Care Patient           Patient will benefit from skilled therapeutic intervention in order to improve the following deficits and impairments:  Abnormal gait,Decreased balance,Decreased cognition,Decreased mobility,Decreased strength,Decreased coordination  Visit Diagnosis: Other abnormalities of gait and mobility  Muscle weakness (generalized)  Unsteadiness on feet     Problem List Patient Active Problem List   Diagnosis Date Noted  . Seizure-like activity (HCC) 04/17/2021  . Hypomagnesemia 02/10/2021  . Generalized anxiety disorder 02/10/2021  . Insomnia 02/10/2021  . Fall due to seizure (HCC) 02/10/2021  . Head injury 02/10/2021  . Macrocytic anemia 02/10/2021  . 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 Seizure (HCC) 02/09/2021  . Elevated LFTs 02/09/2021  . Substance abuse (HCC) 02/18/2013  . Alcohol dependence (HCC) 02/18/2013  . Headache disorder 02/26/2012  . 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 MDD (major depressive disorder) 12/26/2010  . HTN (hypertension) 12/26/2010  . ALLERGIC RHINITIS  12/26/2010  . Asthma 12/26/2010    Ronn Melena, PT, DPT, NCS 05/16/2021, 10:54 AM  [facility] [address] [place], [place], [address] Phone: [phone]   Fax:  [phone]  Name: Kelly Zuniga MRN: [id number] Date of Birth: [birth year]

## 2021-05-16 NOTE — Patient Instructions (Signed)
   We will work on some relaxation strategies when you get panicked and your speech fluidity declines.

## 2021-05-18 ENCOUNTER — Ambulatory Visit
Admission: RE | Admit: 2021-05-18 | Discharge: 2021-05-18 | Disposition: A | Discharge status: Home / Self Care | Payer: No Typology Code available for payment source | Source: Ambulatory Visit | Attending: Neurology | Admitting: Neurology

## 2021-05-18 DIAGNOSIS — R569 Unspecified convulsions: Secondary | ICD-10-CM | POA: Diagnosis not present

## 2021-05-18 MED ORDER — GADOBENATE DIMEGLUMINE 529 MG/ML IV SOLN
9.0000 mL | Freq: Once | INTRAVENOUS | Status: AC | PRN
  Administered 2021-05-18: 9 mL via INTRAVENOUS

## 2021-05-20 ENCOUNTER — Ambulatory Visit: Payer: No Typology Code available for payment source

## 2021-05-20 ENCOUNTER — Ambulatory Visit: Payer: No Typology Code available for payment source | Admitting: Cardiology

## 2021-05-20 ENCOUNTER — Telehealth: Payer: Self-pay | Admitting: Neurology

## 2021-05-20 NOTE — Progress Notes (Incomplete)
Cardiology Office Note:    Date:  05/20/2021   ID:  Kelly Zuniga, DOB [birth year], MRN [id number]  PCP:  [name], NP  Cardiologist:  None  Referring MD: [name], NP   No chief complaint on file.   History of Present Illness:    [name] is a 55 y.o. female with a hx of alcohol abuse, anemia, anxiety, asthma, depression, hypertension (uncontrolled), and seizures,*** who is seen as a new consult at the request of [name], NP for the evaluation and management of ***.  Today:  Cardiovascular risk factors: Prior clinical ASCVD:  Comorbid conditions, including hypertension, hyperlipidemia, diabetes, chronic kidney disease:  Metabolic syndrome/Obesity: Chronic inflammatory conditions: Tobacco use history: Family history: Prior cardiac testing and/or incidental findings on other testing (ie coronary calcium): Exercise level: Current diet:    She denies any chest pain, shortness of breath, palpitations, or exertional symptoms. No headaches, lightheadedness, or syncope to report. Also has no lower extremity edema, orthopnea or PND.   Past Medical History:  Diagnosis Date   Alcohol abuse    Allergy    Anemia    low iron   Anxiety    Asthma    Asthma    Phreesia 11/09/2020   Depression    posibly bipolar disorder. 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   Hypertension    never on meds    Pneumonia    at 13 years   Seizure (HCC) 02/09/2021   Seizure (HCC)    Substance abuse (HCC)    Phreesia 11/09/2020    Past Surgical History:  Procedure Laterality Date   benign cyst     removed from left cheek   RADIOLOGY WITH ANESTHESIA N/A 04/15/2021   Procedure: MRI/CT OF ABDOMEN WITH ANESTHESIA;  Surgeon: Radiologist, Medication, MD;  Location: [facility] OR;  Service: Radiology;  Laterality:  N/A;   TONSILECTOMY, ADENOIDECTOMY, BILATERAL MYRINGOTOMY AND TUBES      Current Medications: Current Outpatient Medications on File Prior to Visit  Medication Sig   albuterol (PROVENTIL) (2.5 MG/3ML) 0.083% nebulizer solution Take 2.5 mg by nebulization every 6 (six) hours as needed for wheezing or shortness of breath.   albuterol (VENTOLIN HFA) 108 (90 Base) MCG/ACT inhaler Inhale 2 puffs into the lungs See admin instructions. Inhale 2 puffs into the lungs every 4-6 hours as needed for shortness of breath or wheezing   B Complex-C (SUPER B COMPLEX/VITAMIN C PO) Take 1 tablet by mouth daily. With Folic Acid   BIOTIN PO Take 1 tablet by mouth in the morning.   cetirizine (ZYRTEC) 10 MG tablet Take 10 mg by mouth daily.   clonazePAM (KLONOPIN) 0.5 MG tablet Take 1 tablet (0.5 mg total) by mouth 3 (three) times daily as needed for anxiety.   escitalopram (LEXAPRO) 10 MG tablet Take 10 mg by mouth 2 (two) times daily.   magnesium gluconate (MAGONATE) 500 MG tablet Take 500 mg by mouth daily.   Multiple Vitamins-Minerals (MULTI ADULT GUMMIES) CHEW Chew 2 tablets by mouth in the morning.   naproxen sodium (ALEVE) 220 MG tablet Take 220 mg by mouth 2 (two) times daily as needed (for mild pain).   thiamine 100 MG tablet Take 1 tablet (100 mg total) by mouth daily.   topiramate (TOPAMAX) 100 MG tablet Take 1 tablet (100 mg total) by mouth 2 (  two) times daily.   triamcinolone (NASACORT) 55 MCG/ACT nasal inhaler Place 2 sprays into the nose daily. For allergies (Patient taking differently: Place 2 sprays into the nose in the morning.)   No current facility-administered medications on file prior to visit.     Allergies:   Acetaminophen, Lithium, Promethazine-codeine, Codeine, and Morphine and related   Social History   Tobacco Use   Smoking status: Current Every Day Smoker    Packs/day: 0.25    Types: Cigarettes   Smokeless tobacco: Never Used   Tobacco comment: Using the E  cigarette to help her quit & still smoking  Vaping Use   Vaping Use: Former  Substance Use Topics   Alcohol use: Yes    Comment: 1 glass of wine per day or less   Drug use: No    Family History: family history includes Cancer in her father; Coronary artery disease in an other family member; Depression in an other family member; Diabetes in her father and another family member; Gout in her father; Healthy in her mother; Heart attack in her father; Heart failure in her father; Hypertension in her father; Ovarian cancer in an other family member.  ROS:   Please see the history of present illness.  Additional pertinent ROS: Constitutional: Negative for chills, fever, night sweats, unintentional weight loss  HENT: Negative for ear pain and hearing loss.   Eyes: Negative for loss of vision and eye pain.  Respiratory: Negative for cough, sputum, wheezing.   Cardiovascular: See HPI. Gastrointestinal: Negative for abdominal pain, melena, and hematochezia.  Genitourinary: Negative for dysuria and hematuria.  Musculoskeletal: Negative for falls and myalgias.  Skin: Negative for itching and rash.  Neurological: Negative for focal weakness, focal sensory changes and loss of consciousness.  Endo/Heme/Allergies: Does not bruise/bleed easily.     EKGs/Labs/Other Studies Reviewed:    The following studies were reviewed today: No prior CV studies.  EKG:  EKG is personally reviewed.   05/20/2021: ***  Recent Labs: 11/12/2020: TSH 1.680 02/24/2021: BNP 43.7; Magnesium 1.6 04/15/2021: ALT 85; BUN 8; Creatinine, Ser 0.54; Hemoglobin 12.0; Platelets 162; Potassium 3.5; Sodium 137  Recent Lipid Panel    Component Value Date/Time   CHOL 232 (H) 11/12/2020 1554   TRIG 77 11/12/2020 1554   HDL 88 11/12/2020 1554   CHOLHDL 2.6 11/12/2020 1554   CHOLHDL 3 01/27/2011 1017   VLDL 11.4 01/27/2011 1017   LDLCALC 131 (H) 11/12/2020 1554    Physical Exam:    VS:  LMP 11/04/2013     Wt Readings  from Last 3 Encounters:  04/17/21 97 lb (44 kg)  04/15/21 97 lb (44 kg)  03/25/21 96 lb 9.6 oz (43.8 kg)    GEN: Well nourished, well developed in no acute distress HEENT: Normal, moist mucous membranes NECK: No JVD CARDIAC: regular rhythm, normal S1 and S2, no rubs or gallops. No murmur. VASCULAR: Radial and DP pulses 2+ bilaterally. No carotid bruits RESPIRATORY:  Clear to auscultation without rales, wheezing or rhonchi  ABDOMEN: Soft, non-tender, non-distended MUSCULOSKELETAL:  Ambulates independently SKIN: Warm and dry, no edema NEUROLOGIC:  Alert and oriented x 3. No focal neuro deficits noted. PSYCHIATRIC:  Normal affect    ASSESSMENT:    No diagnosis found. PLAN:     Cardiac risk counseling and prevention recommendations: -recommend heart healthy/Mediterranean diet, with whole grains, fruits, vegetable, fish, lean meats, nuts, and olive oil. Limit salt. -recommend moderate walking, 3-5 times/week for 30-50 minutes each session. Aim for at least  150 minutes.week. Goal should be pace of 3 miles/hours, or walking 1.5 miles in 30 minutes -recommend avoidance of tobacco products. Avoid excess alcohol. -ASCVD risk score: The 10-year ASCVD risk score Denman George DC Montez Hageman., et al., 2013) is: 2.6%   Values used to calculate the score:     Age: 44 years     Sex: Female     Is Non-Hispanic African American: No     Diabetic: No     Tobacco smoker: Yes     Systolic Blood Pressure: 118 mmHg     Is BP treated: No     HDL Cholesterol: 88 mg/dL     Total Cholesterol: 232 mg/dL    Plan for follow up: *** months or sooner PRN.  Jodelle Red, MD, PhD, [facility] [place]   [facility]    Medication Adjustments/Labs and Tests Ordered: Current medicines are reviewed at length with the patient today.  Concerns regarding medicines are outlined above.  No orders of the defined types were placed in this encounter.  No orders of the defined types were placed in this encounter.   There  are no Patient Instructions on file for this visit.   I,Mathew Stumpf,acting as a Neurosurgeon for [employer], MD.,have documented all relevant documentation on the behalf of Jodelle Red, MD,as directed by  Jodelle Red, MD while in the presence of Jodelle Red, MD.  ***  Signed, Jodelle Red, MD PhD 05/20/2021 3:04 PM     [facility]

## 2021-05-20 NOTE — Telephone Encounter (Signed)
  IMPRESSION: This MRI of the brain with and without contrast shows the following: 1.   Cortical atrophy mostly affecting the frontal and parietal lobes, unchanged compared to the 02/09/2021 MRI. 2.   Scattered T2/FLAIR hyperintense foci in the hemispheres most consistent with age advanced chronic microvascular ischemic change, minimal foci appear to be acute and there were no new lesions compared to the 02/09/2021 MRI. 3.   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 4.   No acute findings.  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

## 2021-05-20 NOTE — Telephone Encounter (Signed)
Left second message for a return call. 

## 2021-05-20 NOTE — Telephone Encounter (Signed)
I spoke to patient and provided her with the MRI brain results. She verbalized understanding of the findings.

## 2021-05-20 NOTE — Telephone Encounter (Signed)
Left message for a return call

## 2021-05-21 ENCOUNTER — Other Ambulatory Visit: Payer: Self-pay

## 2021-05-21 ENCOUNTER — Ambulatory Visit (INDEPENDENT_AMBULATORY_CARE_PROVIDER_SITE_OTHER): Payer: No Typology Code available for payment source | Admitting: Neurology

## 2021-05-21 DIAGNOSIS — R569 Unspecified convulsions: Secondary | ICD-10-CM

## 2021-05-22 ENCOUNTER — Telehealth: Payer: Self-pay | Admitting: Neurology

## 2021-05-22 NOTE — Telephone Encounter (Signed)
I spoke to the patient. She verbalized understanding and will contact her cardiology office for follow up.

## 2021-05-22 NOTE — Procedures (Signed)
   HISTORY: 55 year old female, presented with sudden loss of consciousness,  TECHNIQUE:  This is a routine 16 channel EEG recording with one channel devoted to a limited EKG recording.  It was performed during wakefulness, drowsiness and asleep.  Hyperventilation and photic stimulation were performed as activating procedures.  There are minimum muscle and movement artifact noted.  Upon maximum arousal, posterior dominant waking rhythm consistent of rhythmic alpha range activity, with frequency of 9 hz. Activities are symmetric over the bilateral posterior derivations and attenuated with eye opening.  Hyperventilation produced mild/moderate buildup with higher amplitude and the slower activities noted.  Photic stimulation did not alter the tracing.  During EEG recording, patient developed drowsiness and no deeper stage of sleep was achieved  During EEG recording, there was no epileptiform discharge noted.  EKG demonstrate sinus rhythm, with heart rate of 112/min  CONCLUSION: This is a  normal awake EEG.  There is no electrodiagnostic evidence of epileptiform discharge.  Levert Feinstein, M.D. Ph.D.  [facility] [address] [place], [place] [address] Phone: [phone] Fax:      [phone]

## 2021-05-22 NOTE — Telephone Encounter (Signed)
Patient EEG is normal, but on the EKG tracing, there was evidence of tachycardia, please make sure that she is on scheduled to follow-up with her cardiologist

## 2021-05-23 ENCOUNTER — Ambulatory Visit: Payer: No Typology Code available for payment source

## 2021-05-27 ENCOUNTER — Ambulatory Visit: Payer: No Typology Code available for payment source

## 2021-05-27 ENCOUNTER — Ambulatory Visit: Payer: No Typology Code available for payment source | Attending: Internal Medicine

## 2021-05-27 ENCOUNTER — Other Ambulatory Visit: Payer: Self-pay

## 2021-05-27 VITALS — BP 121/78 | HR 100

## 2021-05-27 DIAGNOSIS — R2681 Unsteadiness on feet: Secondary | ICD-10-CM | POA: Diagnosis present

## 2021-05-27 DIAGNOSIS — R41841 Cognitive communication deficit: Secondary | ICD-10-CM | POA: Diagnosis present

## 2021-05-27 DIAGNOSIS — M6281 Muscle weakness (generalized): Secondary | ICD-10-CM | POA: Diagnosis present

## 2021-05-27 DIAGNOSIS — R2689 Other abnormalities of gait and mobility: Secondary | ICD-10-CM

## 2021-05-27 DIAGNOSIS — R4701 Aphasia: Secondary | ICD-10-CM | POA: Diagnosis present

## 2021-05-27 NOTE — Therapy (Signed)
Kelly Zuniga [address] [place], [name], [address] Phone: [phone]   Fax:  [phone]  Speech Language Pathology Treatment/Discharge Summary  Patient Details  Name: Kelly Zuniga MRN: [id number] Date of Birth: 03-Nov-1966 Referring Provider (SLP): Kelly Bear, DO   Encounter Date: 05/27/2021   End of Session - 05/27/21 1732    Visit Number 13    Number of Visits 17    Date for SLP Re-Evaluation 06/02/21    SLP Start Time 1103    SLP Stop Time  1145    SLP Time Calculation (min) 42 min    Activity Tolerance Patient tolerated treatment well           Past Medical History:  Diagnosis Date  . Alcohol abuse   . Allergy   . Anemia    low iron  . Anxiety   . Asthma   . Asthma    Phreesia 11/09/2020  . Depression    posibly bipolar disorder. Sees Kelly Zuniga for psychiatric care and Kelly Zuniga for therapy  . Depression    Phreesia 11/09/2020  . Headache(784.0)    last migraine 2004  . 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 Hypertension    never on meds   . Pneumonia    at 14 years  . Seizure ([place]) 02/09/2021  . Seizure ([place])   . Substance abuse ([place])    Phreesia 11/09/2020    Past Surgical History:  Procedure Laterality Date  . benign cyst     removed from left cheek  . RADIOLOGY WITH ANESTHESIA N/A 04/15/2021   Procedure: MRI/CT OF ABDOMEN WITH ANESTHESIA;  Surgeon: Radiologist, Medication, MD;  Location: [facility];  Service: Radiology;  Laterality: N/A;  . TONSILECTOMY, ADENOIDECTOMY, BILATERAL MYRINGOTOMY AND TUBES      There were no vitals filed for this visit.   SPEECH THERAPY DISCHARGE SUMMARY  Visits from Start of Care: 13  Current functional level related to goals / functional outcomes: One instance of word finding today but pt thought of word in 8 seconds while describing/using self-initiated verbal cues. Speech fluent today.   Remaining deficits: Mild word finding deficits.  Question if deficits are very mild in nature and exacerbated by psychological factors, of if deficits are based in psychological factors.   Education / Equipment: Compensations for deficits.   Plan: Patient agrees to discharge.  Patient goals were met. Patient is being discharged due to meeting the stated rehab goals.  ?????        Subjective Assessment - 05/27/21 1112    Subjective Pt walked with her cane back to ST room without assistance.    Currently in Pain? No/denies                 ADULT SLP TREATMENT - 05/27/21 0001      General Information   Behavior/Cognition Alert;Cooperative;Pleasant mood      Treatment Provided   Treatment provided Cognitive-Linquistic      Cognitive-Linquistic Treatment   Treatment focused on Patient/family/caregiver education    Skilled Treatment Pt correctly told SLP results of her EEG and MRI. Told SLP recommendation was that she needed to contact her cardiologist and she had an appointment made for Friday. SLP educated pt about some of the relaxation strategies SLP has used in the past with other patients who need periods of relaxation, including relaxation scene imagery (her as the sole person in a beach chair on the beach in a blue tent listening to the  ocean), abdominal breathing, and imagery of melting or evaporating any muscle tension in her body. Pt with one anomia event - corrected in 8 seconds by verbalizing while describing the term. SLP had pt teach back SLP about this and pt did so with rare min A. Pt uses Alexa, and writing things down on her clipboard, and uses her watch for calendar orientation, and her calendar and phone for appointment management. Pt has been successfully setting up her med box since SLP suggested it.      Assessment / Recommendations / Plan   Plan Discharge SLP treatment due to (comment)   pt met goals and has strategies for anxiety and frustration     Progression Toward Goals   Progression toward goals --    d/c day- see goals           SLP Education - 05/27/21 1731    Education Details relaxation techniques    Person(s) Educated Patient    Methods Explanation;Demonstration    Comprehension Verbalized understanding;Returned demonstration            SLP Short Term Goals - 04/03/21 1228      SLP SHORT TERM GOAL #1   Title pt will report successful usage of a med tracking system between 3 sessions    Baseline 03-25-21 (Alexa timer),    Period --   or 9 sessions, for all STGs   Status Partially Met      SLP SHORT TERM GOAL #2   Title pt will demonstrate WNL verbal expression in 15 minutes mod complex conversation in 4 sessions using compensations PRN    Baseline 03-27-21    Status Partially Met      SLP SHORT TERM GOAL #3   Title pt will complete cognitive assessment in the first two therapy sessions    Period --   sessions   Status Achieved            SLP Long Term Goals - 05/27/21 1733      SLP LONG TERM GOAL #1   Title pt will demonstrate WNL verbal expression in 15 minutes mod complex-complex conversation in 4 sessions using compensations PRN    Baseline 04-03-21, 04/08/21, 04-29-21    Period --   or 17 total sesisons, for all LTGs   Status Achieved      SLP LONG TERM GOAL #2   Title pt will indicate a frustration level regarding aonmia which does not debilitate further conversation    Baseline 04-03-21    Status Achieved      SLP LONG TERM GOAL #3   Title pt will report more comfort in 3 tasks she was independent with (or in tasks she has not done but which she is confident she can become independent with) that she is able to do given compensations    Status Achieved            Plan - 05/27/21 [place] presents again today with speech and language WFL/WNL. One instance of anomia observed during session - pt successfully used description strategy. Pt's fluency was WNL. SLP cont to postulate that [place]'s deficits are either very mild  in nature, and are exacerbated by psychological factors, or speech and language deficits are based in psychological factors. Pt agreed with d/c today.    Duration --   or 17 total visits   Treatment/Interventions Environmental controls;Functional tasks;Compensatory techniques;SLP instruction and feedback;Multimodal communcation approach;Language facilitation;Cueing hierarchy;Cognitive reorganization;Patient/family education;Internal/external aids  Potential to Achieve Goals Good    SLP Home Exercise Plan provided    Consulted and Agree with Plan of Care Patient           Patient will benefit from skilled therapeutic intervention in order to improve the following deficits and impairments:   Aphasia  Cognitive communication deficit    Problem List Patient Active Problem List   Diagnosis Date Noted  . Seizure-like activity ([place]) 04/17/2021  . Hypomagnesemia 02/10/2021  . Generalized anxiety disorder 02/10/2021  . Insomnia 02/10/2021  . Fall due to seizure ([place]) 02/10/2021  . Head injury 02/10/2021  . Macrocytic anemia 02/10/2021  . Seizure as late effect of cerebrovascular accident (CVA) ([place]) 02/10/2021  . Seizure ([place]) 02/09/2021  . Elevated LFTs 02/09/2021  . Substance abuse ([place]) 02/18/2013  . Alcohol dependence ([place]) 02/18/2013  . Headache disorder 02/26/2012  . 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 MDD (major depressive disorder) 12/26/2010  . HTN (hypertension) 12/26/2010  . ALLERGIC RHINITIS 12/26/2010  . Asthma 12/26/2010    [facility] ,[place], [place]  05/27/2021, 5:34 PM  [place] [address] [place], [name], [address] Phone: [phone]   Fax:  [phone]   Name: Kelly Zuniga MRN: [id number] Date of Birth: 1966-06-19

## 2021-05-27 NOTE — Therapy (Signed)
[facility] [address] [place], [place], [address] Phone: [phone]   Fax:  [phone]  Physical Therapy Treatment  Patient Details  Name: Kelly Zuniga MRN: [id number] Date of Birth: June 24, 1966 Referring Provider (PT): Referred by [name] (hospitalist) but PCP is [name].   Encounter Date: 05/27/2021   PT End of Session - 05/27/21 1143    Visit Number 14    Number of Visits 20    PT Start Time 1143    PT Stop Time 1228    PT Time Calculation (min) 45 min    Equipment Utilized During Treatment Gait belt    Activity Tolerance Patient tolerated treatment well    Behavior During Therapy Anxious           Past Medical History:  Diagnosis Date  . Alcohol abuse   . Allergy   . Anemia    low iron  . Anxiety   . Asthma   . Asthma    Phreesia 11/09/2020  . Depression    posibly bipolar disorder. Sees Dr [name] for psychiatric care and Dr [name] for therapy  . Depression    Phreesia 11/09/2020  . Headache(784.0)    last migraine 2004  . 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 Hypertension    never on meds   . Pneumonia    at 13 years  . Seizure ([facility]) 02/09/2021  . Seizure ([facility])   . Substance abuse ([facility])    Phreesia 11/09/2020    Past Surgical History:  Procedure Laterality Date  . benign cyst     removed from left cheek  . RADIOLOGY WITH ANESTHESIA N/A 04/15/2021   Procedure: MRI/CT OF ABDOMEN WITH ANESTHESIA;  Surgeon: Radiologist, Medication, MD;  Location: [facility];  Service: Radiology;  Laterality: N/A;  . TONSILECTOMY, ADENOIDECTOMY, BILATERAL MYRINGOTOMY AND TUBES      Vitals:   05/27/21 1150  BP: 121/78  Pulse: 100     Subjective Assessment - 05/27/21 1144    Subjective Patient was discharged from Speech Therapy services today. Reports MRI went well, did have some cortical atrophy mostly affecting the frontal and parietal lobes, unchanged compared to the 02/09/2021 MRI.  Is going to follow up with cardiology regarding EEG results. Reports she has been feeling pretty good overall. No falls. Reports her BP and HR has been fluctuating.    Pertinent History 55 y.o. female with medical history significant of alcohol abuse, astma,anxiety/depression, HTN, holmes-adie syndrome reprts 3-4 days of room spinning dizziness. At work today she had continued dizziness, hand cramping and weakness. She had a brief episode of LOC falling and striking posterior occiput. Coworkers witness some whole body shaking lasting several seconds. EMS activated and patient transported to [facility] for evaluation.   CT head w/o acute findings, MRI brain w/o acute findings.    Patient Stated Goals Pt wants to improve her balance and be able to bend over and sit to without feeling like she will fall. Also wants to build up her strength.    Currently in Pain? No/denies    Pain Onset In the past 7 days    Pain Onset In the past 7 days             [facility] Adult PT Treatment/Exercise - 05/27/21 0001      Transfers   Transfers Sit to Stand;Stand to Sit    Sit to Stand 5: Supervision    Stand to Sit 5: Supervision    Number of  Reps 10 reps;2 sets   completed with BLE on airex, cues for controlled descent to further promote strengthening/balance.     Ambulation/Gait   Ambulation/Gait Yes    Ambulation/Gait Assistance 5: Supervision    Ambulation/Gait Assistance Details Completed ambulation x 500 ft without AD, patient demonstrating improved step length and no significant unsteadiness noted. Cues for relaxation to promote arm swing.    Ambulation Distance (Feet) 500 Feet    Assistive device None    Gait Pattern Step-through pattern;Decreased arm swing - right;Decreased arm swing - left;Decreased step length - right;Decreased step length - left;Decreased trunk rotation    Ambulation Surface Level;Indoor      High Level Balance   High Level Balance Activities Negotiating over obstacles;Backward  walking;Tandem walking    High Level Balance Comments Completed ambulation with stepping over orange hurdles and cones, with cones step over completed single toe tap prior to stepping to further promote SLS x 3 laps down and back. Then with cones only completed BLE toe tap followed by step over with leading foot x 2 laps down and back. increased challenge with bil toe tap. CGA. On blue mat at countertop: completed x 3 laps down and back of backwards walking and tandem walking. increased challenge with tandem walking requiring intermittent UE support and CGA from PT.      Neuro Re-ed    Neuro Re-ed Details  Standing with wide BOS on airex: completed alternating diagonal withs BUE w/ 2# weighted ball, focused on improved stability and core activiation, completed x 10 reps each direction. Increased challenge with diagonal up on L side.               Balance Exercises - 05/27/21 0001      Balance Exercises: Standing   Standing Eyes Closed Narrow base of support (BOS);Head turns;Foam/compliant surface;3 reps;30 secs;Limitations    Standing Eyes Closed Limitations completed standing narrow BOS on blue mat, x 30 seconds with eyes closed, then completed eyes closed with addition of horizontal head turns x 10 reps.    Tandem Stance Eyes open;Foam/compliant surface;4 reps;20 secs;Limitations    Tandem Stance Time standing on blue mat completed alternating tandem stance, 2 x 20 seconds bilaterally. Increased challenge noted with LLE posterior. intermittent touch A to countertop and CGA from PT.               PT Short Term Goals - 05/14/21 1222      PT SHORT TERM GOAL #1   Title Pt will be independent with progressive HEP for strength and balance    Baseline continue to progress HEP    Time 3    Period Weeks    Status Revised    Target Date 06/04/21             PT Long Term Goals - 05/14/21 1224      PT LONG TERM GOAL #1   Title Pt will be independent with final HEP for strength and  balance to continue gains on own. (All LTGs Due: 07/02/21)    Baseline reports independence; will benefit from progressive HEP    Time 7    Period Weeks    Status On-going    Target Date 07/02/21      PT LONG TERM GOAL #2   Title Pt will improve DGI to >/= 19/24 to indicate reduced fall risk    Baseline 17/24    Time 7    Period Weeks    Status New  PT LONG TERM GOAL #3   Title Pt will be able to ambulate x 115 ft with LRAD and scanning environment without LOB noted    Baseline increased challenge with horizontal head turns/scanning    Time 7    Period Weeks    Status New      PT LONG TERM GOAL #4   Title Pt will increase Berg to >50/56 for improved balance and decreased fall risk.    Baseline 03/04/21 42/56. 04/29/21 43/56    Time 7    Period Weeks    Status On-going                 Plan - 05/27/21 1305    Clinical Impression Statement Today's skilled PT session focused on continued gait training and balance training. Patient demonstrating improved gait pattern but did require cues for arm swing intermittently. Rest of session focused on activiites promoting narrow BOS, negotiation over obstalces and SLS. Increased challenge with tandem noted today. Will continue to progress toward all LTGs.    Personal Factors and Comorbidities Comorbidity 3+    Comorbidities alcohol abuse, astma,anxiety/depression, HTN, holmes-adie syndrome    Examination-Activity Limitations Locomotion Level;Transfers;Stairs;Stand    Examination-Participation Restrictions Community Activity;Occupation;Cleaning    Stability/Clinical Decision Making Evolving/Moderate complexity    Rehab Potential Good    PT Frequency 2x / week    PT Duration --   for 1 week, followed by 1x/week for 6 weeks   PT Treatment/Interventions ADLs/Self Care Home Management;DME Instruction;Gait training;Stair training;Functional mobility training;Therapeutic activities;Therapeutic exercise;Balance training;Neuromuscular  re-education;Patient/family education;Vestibular;Visual/perceptual remediation/compensation    PT Next Visit Plan Pt has high anxiety. Review HEP and Check STGs. Continue to progress gait training without AD more. Gait with head turns.  Continue balance activities on compliant surface, narrow BOS. negotiating over obstacles.    Consulted and Agree with Plan of Care Patient           Patient will benefit from skilled therapeutic intervention in order to improve the following deficits and impairments:  Abnormal gait,Decreased balance,Decreased cognition,Decreased mobility,Decreased strength,Decreased coordination  Visit Diagnosis: Unsteadiness on feet  Other abnormalities of gait and mobility  Muscle weakness (generalized)     Problem List Patient Active Problem List   Diagnosis Date Noted  . Seizure-like activity ([facility]) 04/17/2021  . Hypomagnesemia 02/10/2021  . Generalized anxiety disorder 02/10/2021  . Insomnia 02/10/2021  . Fall due to seizure ([facility]) 02/10/2021  . Head injury 02/10/2021  . Macrocytic anemia 02/10/2021  . 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 Seizure ([facility]) 02/09/2021  . Elevated LFTs 02/09/2021  . Substance abuse ([facility]) 02/18/2013  . Alcohol dependence ([facility]) 02/18/2013  . Headache disorder 02/26/2012  . 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 MDD (major depressive disorder) 12/26/2010  . HTN (hypertension) 12/26/2010  . ALLERGIC RHINITIS 12/26/2010  . Asthma 12/26/2010    Tempie Donning, PT, DPT 05/27/2021, 1:09 PM  [facility] [address] [place], [place], [address] Phone: [phone]   Fax:  [phone]  Name: Kelly Zuniga MRN: [id number] Date of Birth: 1966/01/09

## 2021-05-30 ENCOUNTER — Ambulatory Visit (INDEPENDENT_AMBULATORY_CARE_PROVIDER_SITE_OTHER): Payer: No Typology Code available for payment source | Admitting: Cardiology

## 2021-05-30 ENCOUNTER — Encounter: Payer: Self-pay | Admitting: Cardiology

## 2021-05-30 ENCOUNTER — Ambulatory Visit: Payer: No Typology Code available for payment source

## 2021-05-30 ENCOUNTER — Other Ambulatory Visit: Payer: Self-pay

## 2021-05-30 ENCOUNTER — Ambulatory Visit (INDEPENDENT_AMBULATORY_CARE_PROVIDER_SITE_OTHER): Payer: No Typology Code available for payment source

## 2021-05-30 VITALS — BP 152/84 | HR 119 | Ht 60.0 in | Wt 89.2 lb

## 2021-05-30 DIAGNOSIS — R002 Palpitations: Secondary | ICD-10-CM

## 2021-05-30 DIAGNOSIS — R000 Tachycardia, unspecified: Secondary | ICD-10-CM | POA: Diagnosis not present

## 2021-05-30 DIAGNOSIS — R55 Syncope and collapse: Secondary | ICD-10-CM

## 2021-05-30 DIAGNOSIS — I10 Essential (primary) hypertension: Secondary | ICD-10-CM

## 2021-05-30 DIAGNOSIS — R9431 Abnormal electrocardiogram [ECG] [EKG]: Secondary | ICD-10-CM | POA: Diagnosis not present

## 2021-05-30 DIAGNOSIS — Z720 Tobacco use: Secondary | ICD-10-CM

## 2021-05-30 LAB — CBC
Hematocrit: 33.9 % — ABNORMAL LOW (ref 34.0–46.6)
Hemoglobin: 12 g/dL (ref 11.1–15.9)
MCH: 34.6 pg — ABNORMAL HIGH (ref 26.6–33.0)
MCHC: 35.4 g/dL (ref 31.5–35.7)
MCV: 98 fL — ABNORMAL HIGH (ref 79–97)
Platelets: 223 10*3/uL (ref 150–450)
RBC: 3.47 x10E6/uL — ABNORMAL LOW (ref 3.77–5.28)
RDW: 13.1 % (ref 11.7–15.4)
WBC: 5.9 10*3/uL (ref 3.4–10.8)

## 2021-05-30 LAB — COMPREHENSIVE METABOLIC PANEL
ALT: 99 IU/L — ABNORMAL HIGH (ref 0–32)
AST: 169 IU/L — ABNORMAL HIGH (ref 0–40)
Albumin/Globulin Ratio: 2.5 — ABNORMAL HIGH (ref 1.2–2.2)
Albumin: 4.9 g/dL (ref 3.8–4.9)
Alkaline Phosphatase: 97 IU/L (ref 44–121)
BUN/Creatinine Ratio: 14 (ref 9–23)
BUN: 10 mg/dL (ref 6–24)
Bilirubin Total: 0.5 mg/dL (ref 0.0–1.2)
CO2: 22 mmol/L (ref 20–29)
Calcium: 10 mg/dL (ref 8.7–10.2)
Chloride: 98 mmol/L (ref 96–106)
Creatinine, Ser: 0.72 mg/dL (ref 0.57–1.00)
Globulin, Total: 2 g/dL (ref 1.5–4.5)
Glucose: 95 mg/dL (ref 65–99)
Potassium: 3.8 mmol/L (ref 3.5–5.2)
Sodium: 143 mmol/L (ref 134–144)
Total Protein: 6.9 g/dL (ref 6.0–8.5)
eGFR: 100 mL/min/{1.73_m2} (ref >59)

## 2021-05-30 LAB — TSH: TSH: 2.55 u[IU]/mL (ref 0.450–4.500)

## 2021-05-30 NOTE — Progress Notes (Unsigned)
Enrolled patient for a 7 day Zio XT Monitor to be mailed to patients home.  

## 2021-05-30 NOTE — Progress Notes (Addendum)
Cardiology Office Note:    Date:  05/30/2021   ID:  Kelly Zuniga, DOB 1966-07-05, MRN [id number]  PCP:  [name], NP  Cardiologist:  None  Referring MD: [name], NP   Chief Complaint  Patient presents with   Loss of Consciousness     History of Present Illness:    Kelly Zuniga is a 55 y.o. female with a hx of alcohol abuse, anemia, anxiety, asthma, depression, hypertension , and possible seizures who is seen as a new consult at the request of [name], NP for the evaluation and management of syncope.  Reports had episode in April 202 while walking her dog where she had sudden lightheadedness and lost balance and fell to the ground.  She did not lose consciousness.  Reports has had 2 similar episodes recently occurred with changing positions, from sitting to standing she felt lightheaded and fell to the ground.  Other episode occurred as she was walking through her home.  She has been seen by neurology, MRI of the brain showed no acute abnormalities.  EEG unremarkable for seizure but noted to have tachycardia with heart rate 112.  She reports she has been doing physical therapy.  Denies any chest pain but does get short of breath.  Reports has been having palpitations, occurs about 4 times per week, last for few minutes.  Denies any lower extremity edema.  Smoked up to 1 pack/day since age 55, currently 3 to 8 cigarettes/day.  Reports BP has been 120s to 140s when checks at home.  Family history includes father died of MI in 2s.  BP Readings from Last 3 Encounters:  05/30/21 (!) 152/84  05/27/21 121/78  05/16/21 118/78      Past Medical History:  Diagnosis Date   Alcohol abuse    Allergy    Anemia    low iron   Anxiety    Asthma    Asthma    [employer]reesia 11/09/2020   Depression    posibly bipolar disorder. 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   Hypertension    never on meds    Pneumonia    at 13 years   Seizure (HCC) 02/09/2021   Seizure (HCC)    Substance abuse (HCC)    [employer] 11/09/2020    Past Surgical History:  Procedure Laterality Date   benign cyst     removed from left cheek   RADIOLOGY WITH ANESTHESIA N/A 04/15/2021   Procedure: MRI/CT OF ABDOMEN WITH ANESTHESIA;  Surgeon: Radiologist, Medication, MD;  Location: [facility] OR;  Service: Radiology;  Laterality: N/A;   TONSILECTOMY, ADENOIDECTOMY, BILATERAL MYRINGOTOMY AND TUBES      Current Medications: Current Outpatient Medications on File Prior to Visit  Medication Sig   albuterol (PROVENTIL) (2.5 MG/3ML) 0.083% nebulizer solution Take 2.5 mg by nebulization every 6 (six) hours as needed for wheezing or shortness of breath.   albuterol (VENTOLIN HFA) 108 (90 Base) MCG/ACT inhaler Inhale 2 puffs into the lungs See admin instructions. Inhale 2 puffs into the lungs every 4-6 hours as needed for shortness of breath or wheezing   B Complex-C (SUPER B COMPLEX/VITAMIN C PO) Take 1 tablet by mouth daily. With Folic Acid   BIOTIN PO Take 1 tablet by mouth in the morning.   cetirizine (ZYRTEC) 10 MG tablet Take 10 mg by  mouth daily.   clonazePAM (KLONOPIN) 0.5 MG tablet Take 1 tablet (0.5 mg total) by mouth 3 (three) times daily as needed for anxiety.   escitalopram (LEXAPRO) 10 MG tablet Take 10 mg by mouth 2 (two) times daily.   magnesium gluconate (MAGONATE) 500 MG tablet Take 500 mg by mouth daily.   Multiple Vitamins-Minerals (MULTI ADULT GUMMIES) CHEW Chew 2 tablets by mouth in the morning.   naproxen sodium (ALEVE) 220 MG tablet Take 220 mg by mouth 2 (two) times daily as needed (for mild pain).   thiamine 100 MG tablet Take 1 tablet (100 mg total) by mouth daily.   topiramate (TOPAMAX) 100 MG tablet Take 1 tablet (100 mg total) by mouth 2 (two) times daily.   triamcinolone (NASACORT) 55 MCG/ACT nasal inhaler Place 2 sprays into the  nose daily. For allergies   No current facility-administered medications on file prior to visit.     Allergies:   Acetaminophen, Lithium, Promethazine-codeine, Codeine, and Morphine and related   Social History   Tobacco Use   Smoking status: Every Day    Packs/day: 0.25    Pack years: 0.00    Types: Cigarettes   Smokeless tobacco: Never   Tobacco comments:    Using the E cigarette to help her quit & still smoking  Vaping Use   Vaping Use: Former  Substance Use Topics   Alcohol use: Yes    Comment: 1 glass of wine per day or less   Drug use: No    Family History: family history includes Cancer in her father; Coronary artery disease in an other family member; Depression in an other family member; Diabetes in her father and another family member; Gout in her father; Healthy in her mother; Heart attack in her father; Heart failure in her father; Hypertension in her father; Ovarian cancer in an other family member.  ROS:   Please see the history of present illness.  Additional pertinent ROS: Constitutional: Negative for chills, fever, night sweats, unintentional weight loss  HENT: Negative for ear pain and hearing loss.   Eyes: Negative for loss of vision and eye pain.  Respiratory: Negative for cough, sputum, wheezing.   Cardiovascular: See HPI. Gastrointestinal: Negative for abdominal pain, melena, and hematochezia.  Genitourinary: Negative for dysuria and hematuria.  Musculoskeletal: Negative for falls and myalgias.  Skin: Negative for itching and rash.  Neurological: Negative for focal weakness, focal sensory changes and loss of consciousness.  Endo/Heme/Allergies: Does not bruise/bleed easily.     EKGs/Labs/Other Studies Reviewed:    The following studies were reviewed today: No prior CV studies.  EKG:  EKG is personally reviewed.   05/30/21: sinus tachycardia, rate 119, LAD, inferior Q waves  Recent Labs: 11/12/2020: TSH 1.680 02/24/2021: BNP 43.7; Magnesium  1.6 04/15/2021: ALT 85; BUN 8; Creatinine, Ser 0.54; Hemoglobin 12.0; Platelets 162; Potassium 3.5; Sodium 137  Recent Lipid Panel    Component Value Date/Time   CHOL 232 (H) 11/12/2020 1554   TRIG 77 11/12/2020 1554   HDL 88 11/12/2020 1554   CHOLHDL 2.6 11/12/2020 1554   CHOLHDL 3 01/27/2011 1017   VLDL 11.4 01/27/2011 1017   LDLCALC 131 (H) 11/12/2020 1554    Physical Exam:    VS:  BP (!) 152/84 (BP Location: Left Arm, Patient Position: Sitting, Cuff Size: Small) Comment (Cuff Size): Adult small  Pulse (!) 119   Ht 5' (1.524 m)   Wt 89 lb 3.2 oz (40.5 kg)   LMP 11/04/2013   SpO2  92%   BMI 17.42 kg/m     Wt Readings from Last 3 Encounters:  05/30/21 89 lb 3.2 oz (40.5 kg)  04/17/21 97 lb (44 kg)  04/15/21 97 lb (44 kg)    GEN: in no acute distress HEENT: normal Neck: no JVD, carotid bruits Cardiac: tachycardic, regular, no murmurs, rubs, or gallops,no edema  Respiratory:  clear to auscultation bilaterally, normal work of breathing GI: soft, nontender, nondistended, + BS MS: no deformity or atrophy Skin: warm and dry, no rash Neuro:  Alert and Oriented x 3, Strength and sensation are intact Psych: euthymic mood, full affect   ASSESSMENT:    1. Palpitations   2. Abnormal EKG   3. Near syncope   4. Tachycardia   5. Essential hypertension   6. Tobacco use    PLAN:    Palpitations/ near syncope: Description concerning for arrhythmia, will evaluate with Zio patch x7 days.  Check echocardiogram  Tachycardia: Sinus tachycardia to 119 clinic today.  Check CMP, TSH, CBC.  Check echocardiogram to evaluate for structural heart disease  Abnormal EKG: Inferior Q waves.  Echocardiogram as above  Hypertension: BP elevated in clinic today but not on any medications.  She reports BP better controlled at home.  Advised to check BP daily for next 2 weeks and call with results.  Tobacco use: Cessation strongly encouraged.  RTC in 2 months   Medication Adjustments/Labs  and Tests Ordered: Current medicines are reviewed at length with the patient today.  Concerns regarding medicines are outlined above.  Orders Placed This Encounter  Procedures   Comprehensive metabolic panel   CBC   TSH   LONG TERM MONITOR (3-14 DAYS)   EKG 12-Lead   ECHOCARDIOGRAM COMPLETE    No orders of the defined types were placed in this encounter.   Patient Instructions  Medication Instructions:  Your physician recommends that you continue on your current medications as directed. Please refer to the Current Medication list given to you today.  *If you need a refill on your cardiac medications before your next appointment, please call your pharmacy*   Lab Work: CMET, CBC, TSH today  If you have labs (blood work) drawn today and your tests are completely normal, you will receive your results only by: MyChart Message (if you have MyChart) OR A paper copy in the mail If you have any lab test that is abnormal or we need to change your treatment, we will call you to review the results.   Testing/Procedures: Your physician has requested that you have an echocardiogram. Echocardiography is a painless test that uses sound waves to create images of your heart. It provides your doctor with information about the size and shape of your heart and how well your heart's chambers and valves are working. This procedure takes approximately one hour. There are no restrictions for this procedure. [facility]  [address]  ZIO XT- Long Term Monitor Instructions   Your physician has requested you wear a ZIO patch monitor for _7__ days.  This is a single patch monitor.   [employer] supplies one patch monitor per enrollment. Additional stickers are not available. Please do not apply patch if you will be having a Nuclear Stress Test, Echocardiogram, Cardiac CT, MRI, or Chest Xray during the period you would be wearing the monitor. The patch  cannot be worn during these tests. You cannot remove and re-apply the ZIO XT patch monitor.  Your  ZIO patch monitor will be sent Fed Ex from [employer] directly to your home address. It may take 3-5 days to receive your monitor after you have been enrolled.  Once you have received your monitor, please review the enclosed instructions. Your monitor has already been registered assigning a specific monitor serial # to you.  Billing and Patient Assistance Program Information   We have supplied [employer] with any of your insurance information on file for billing purposes. [employer] offers a sliding scale Patient Assistance Program for patients that do not have insurance, or whose insurance does not completely cover the cost of the ZIO monitor.   You must apply for the Patient Assistance Program to qualify for this discounted rate.     To apply, please call [employer] at [phone], select option 4, then select option 2, and ask to apply for Patient Assistance Program.  Meredeth Ide will ask your household income, and how many people are in your household.  They will quote your out-of-pocket cost based on that information.  [employer] will also be able to set up a 89-month, interest-free payment plan if needed.  Applying the monitor   Shave hair from upper left chest.  Hold abrader disc by orange tab. Rub abrader in 40 strokes over the upper left chest as indicated in your monitor instructions.  Clean area with 4 enclosed alcohol pads. Let dry.  Apply patch as indicated in monitor instructions. Patch will be placed under collarbone on left side of chest with arrow pointing upward.  Rub patch adhesive wings for 2 minutes. Remove white label marked "1". Remove the white label marked "2". Rub patch adhesive wings for 2 additional minutes.  While looking in a mirror, press and release button in center of patch. A small green light will flash 3-4 times. This will be your only indicator that the monitor has been  turned on. ?  Do not shower for the first 24 hours. You may shower after the first 24 hours.  Press the button if you feel a symptom. You will hear a small click. Record Date, Time and Symptom in the Patient Logbook.  When you are ready to remove the patch, follow instructions on the last 2 pages of the Patient Logbook. Stick patch monitor onto the last page of Patient Logbook.  Place Patient Logbook in the blue and white box.  Use locking tab on box and tape box closed securely.  The blue and white box has prepaid postage on it. Please place it in the mailbox as soon as possible. Your physician should have your test results approximately 7 days after the monitor has been mailed back to [facility].  Call [facility] Customer Care at [phone] if you have questions regarding your ZIO XT patch monitor. Call them immediately if you see an orange light blinking on your monitor.  If your monitor falls off in less than 4 days, contact our Monitor department at [phone]. ?If your monitor becomes loose or falls off after 4 days call [employer] at [phone] for suggestions on securing your monitor.?   Follow-Up: At [facility], you and your health needs are our priority.  As part of our continuing mission to provide you with exceptional heart care, we have created designated Provider Care Teams.  These Care Teams include your primary Cardiologist (physician) and Advanced Practice Providers (APPs -  Physician Assistants and Nurse Practitioners) who all work together to provide you with the care you need, when you need it.  We recommend  signing up for the patient portal called "MyChart".  Sign up information is provided on this After Visit Summary.  MyChart is used to connect with patients for Virtual Visits (Telemedicine).  Patients are able to view lab/test results, encounter notes, upcoming appointments, etc.  Non-urgent messages can be sent to your provider as well.   To learn more about  what you can do with MyChart, go to [email or web address].    Your next appointment:   8/17 at 9:40 AM with Dr. Bjorn Pippin  Other Instructions Please check your blood pressure at home twice daily, write it down.  Call the office or send message via Mychart with the readings in 1-2 weeks for Dr. Bjorn Pippin to review.    Little Ishikawa, MD

## 2021-05-30 NOTE — Patient Instructions (Signed)
Medication Instructions:  Your physician recommends that you continue on your current medications as directed. Please refer to the Current Medication list given to you today.  *If you need a refill on your cardiac medications before your next appointment, please call your pharmacy*   Lab Work: CMET, CBC, TSH today  If you have labs (blood work) drawn today and your tests are completely normal, you will receive your results only by: MyChart Message (if you have MyChart) OR A paper copy in the mail If you have any lab test that is abnormal or we need to change your treatment, we will call you to review the results.   Testing/Procedures: Your physician has requested that you have an echocardiogram. Echocardiography is a painless test that uses sound waves to create images of your heart. It provides your doctor with information about the size and shape of your heart and how well your heart's chambers and valves are working. This procedure takes approximately one hour. There are no restrictions for this procedure. This will be done at our [facility] location:  [address]  ZIO XT- Long Term Monitor Instructions   Your physician has requested you wear a ZIO patch monitor for _7__ days.  This is a single patch monitor.   [employer] supplies one patch monitor per enrollment. Additional stickers are not available. Please do not apply patch if you will be having a Nuclear Stress Test, Echocardiogram, Cardiac CT, MRI, or Chest Xray during the period you would be wearing the monitor. The patch cannot be worn during these tests. You cannot remove and re-apply the ZIO XT patch monitor.  Your ZIO patch monitor will be sent Fed Ex from [employer] directly to your home address. It may take 3-5 days to receive your monitor after you have been enrolled.  Once you have received your monitor, please review the enclosed instructions. Your monitor has already been registered assigning a  specific monitor serial # to you.  Billing and Patient Assistance Program Information   We have supplied [employer] with any of your insurance information on file for billing purposes. [employer] offers a sliding scale Patient Assistance Program for patients that do not have insurance, or whose insurance does not completely cover the cost of the ZIO monitor.   You must apply for the Patient Assistance Program to qualify for this discounted rate.     To apply, please call [employer] at [phone], select option 4, then select option 2, and ask to apply for Patient Assistance Program.  Meredeth Ide will ask your household income, and how many people are in your household.  They will quote your out-of-pocket cost based on that information.  [employer] will also be able to set up a 75-month, interest-free payment plan if needed.  Applying the monitor   Shave hair from upper left chest.  Hold abrader disc by orange tab. Rub abrader in 40 strokes over the upper left chest as indicated in your monitor instructions.  Clean area with 4 enclosed alcohol pads. Let dry.  Apply patch as indicated in monitor instructions. Patch will be placed under collarbone on left side of chest with arrow pointing upward.  Rub patch adhesive wings for 2 minutes. Remove white label marked "1". Remove the white label marked "2". Rub patch adhesive wings for 2 additional minutes.  While looking in a mirror, press and release button in center of patch. A small green light will flash 3-4 times. This will be your only indicator  that the monitor has been turned on. ?  Do not shower for the first 24 hours. You may shower after the first 24 hours.  Press the button if you feel a symptom. You will hear a small click. Record Date, Time and Symptom in the Patient Logbook.  When you are ready to remove the patch, follow instructions on the last 2 pages of the Patient Logbook. Stick patch monitor onto the last page of Patient Logbook.  Place Patient  Logbook in the blue and white box.  Use locking tab on box and tape box closed securely.  The blue and white box has prepaid postage on it. Please place it in the mailbox as soon as possible. Your physician should have your test results approximately 7 days after the monitor has been mailed back to [facility].  Call [facility] Customer Care at [phone] if you have questions regarding your ZIO XT patch monitor. Call them immediately if you see an orange light blinking on your monitor.  If your monitor falls off in less than 4 days, contact our Monitor department at [phone]. ?If your monitor becomes loose or falls off after 4 days call [employer] at [phone] for suggestions on securing your monitor.?   Follow-Up: At [facility], you and your health needs are our priority.  As part of our continuing mission to provide you with exceptional heart care, we have created designated Provider Care Teams.  These Care Teams include your primary Cardiologist (physician) and Advanced Practice Providers (APPs -  Physician Assistants and Nurse Practitioners) who all work together to provide you with the care you need, when you need it.  We recommend signing up for the patient portal called "MyChart".  Sign up information is provided on this After Visit Summary.  MyChart is used to connect with patients for Virtual Visits (Telemedicine).  Patients are able to view lab/test results, encounter notes, upcoming appointments, etc.  Non-urgent messages can be sent to your provider as well.   To learn more about what you can do with MyChart, go to [email or web address].    Your next appointment:   8/17 at 9:40 AM with Dr. Bjorn Pippin  Other Instructions Please check your blood pressure at home twice daily, write it down.  Call the office or send message via Mychart with the readings in 1-2 weeks for Dr. Bjorn Pippin to review.

## 2021-06-03 ENCOUNTER — Ambulatory Visit: Payer: No Typology Code available for payment source

## 2021-06-03 ENCOUNTER — Other Ambulatory Visit: Payer: Self-pay

## 2021-06-03 VITALS — BP 142/88 | HR 106

## 2021-06-03 DIAGNOSIS — R2681 Unsteadiness on feet: Secondary | ICD-10-CM | POA: Diagnosis not present

## 2021-06-03 DIAGNOSIS — R002 Palpitations: Secondary | ICD-10-CM | POA: Diagnosis not present

## 2021-06-03 DIAGNOSIS — M6281 Muscle weakness (generalized): Secondary | ICD-10-CM

## 2021-06-03 DIAGNOSIS — R2689 Other abnormalities of gait and mobility: Secondary | ICD-10-CM

## 2021-06-03 NOTE — Patient Instructions (Signed)
Access Code: [email or web address] URL: [email or web address] Date: 06/03/2021 Prepared by: Jethro Bastos  Exercises Sit to Stand on Foam Pad - 1 x daily - 7 x weekly - 2 sets - 10 reps Side Stepping with Resistance at Thighs and Counter Support - 1 x daily - 7 x weekly - 1 sets - 3 reps Walking March - 1 x daily - 7 x weekly - 1 sets - 3 reps Backward Monster Walk with Resistance at [employer] and Counter Support - 1 x daily - 7 x weekly - 1 sets - 3 reps Romberg Stance Eyes Closed on Foam Pad - 1 x daily - 7 x weekly - 1 sets - 3 reps - 20 sec hold Romberg Stance on Foam Pad with Head Rotation - 1 x daily - 7 x weekly - 2 sets - 10 reps Wide Stance with Eyes Closed and Head Rotation on Foam Pad - 1 x daily - 7 x weekly - 2 sets - 10 reps Tandem Stance in Corner - 1 x daily - 7 x weekly - 1 sets - 3 reps - 25-30 seconds hold Tandem Walking with Counter Support - 1 x daily - 7 x weekly - 1 sets - 3 reps

## 2021-06-03 NOTE — Therapy (Signed)
[facility] [address] [place], [place], [address] Phone: [phone]   Fax:  [phone]  Physical Therapy Treatment  Patient Details  Name: Kelly Zuniga MRN: [id number] Date of Birth: 1966-04-14 Referring Provider (PT): Referred by [name] (hospitalist) but PCP is [name].   Encounter Date: 06/03/2021   PT End of Session - 06/03/21 1109     Visit Number 15    Number of Visits 20    PT Start Time 1107   patient arriving late   PT Stop Time 1144    PT Time Calculation (min) 37 min    Equipment Utilized During Treatment Gait belt    Activity Tolerance Patient tolerated treatment well    Behavior During Therapy Anxious             Past Medical History:  Diagnosis Date   Alcohol abuse    Allergy    Anemia    low iron   Anxiety    Asthma    Asthma    Phreesia 11/09/2020   Depression    posibly bipolar disorder. 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   Hypertension    never on meds    Pneumonia    at 13 years   Seizure (HCC) 02/09/2021   Seizure (HCC)    Substance abuse (HCC)    Phreesia 11/09/2020    Past Surgical History:  Procedure Laterality Date   benign cyst     removed from left cheek   RADIOLOGY WITH ANESTHESIA N/A 04/15/2021   Procedure: MRI/CT OF ABDOMEN WITH ANESTHESIA;  Surgeon: Radiologist, Medication, MD;  Location: [facility] OR;  Service: Radiology;  Laterality: N/A;   TONSILECTOMY, ADENOIDECTOMY, BILATERAL MYRINGOTOMY AND TUBES      Vitals:   06/03/21 1114  BP: (!) 142/88  Pulse: (!) 106     Subjective Assessment - 06/03/21 1109     Subjective Patient reports that she is going to be wearing a heart monitor for 7 days, also going to have an EKG to check for valve issues per patient report from cardiologist. No other new  changes/complaints. No falls.    Pertinent History 55 y.o. female with medical history significant of alcohol abuse, astma,anxiety/depression, HTN, holmes-adie syndrome reprts 3-4 days of room spinning dizziness. At work today she had continued dizziness, hand cramping and weakness. She had a brief episode of LOC falling and striking posterior occiput. Coworkers witness some whole body shaking lasting several seconds. EMS activated and patient transported to [facility] for evaluation.   CT head w/o acute findings, MRI brain w/o acute findings.    Patient Stated Goals Pt wants to improve her balance and be able to bend over and sit to without feeling like she will fall. Also wants to build up her strength.    Currently in Pain? No/denies    Pain Onset In the past 7 days    Pain Onset In the past 7 days                  [facility] Adult PT Treatment/Exercise - 06/03/21 0001       Transfers   Transfers Sit to Stand;Stand to Sit    Sit to Stand 5: Supervision    Stand to Sit 5: Supervision      Ambulation/Gait   Ambulation/Gait Yes  Ambulation/Gait Assistance 5: Supervision    Ambulation/Gait Assistance Details Completed ambulation throughout therapy without AD    Ambulation Distance (Feet) --   clinic distance   Assistive device None    Gait Pattern Step-through pattern;Decreased arm swing - right;Decreased arm swing - left;Decreased step length - right;Decreased step length - left;Decreased trunk rotation    Ambulation Surface Level;Indoor      High Level Balance   High Level Balance Activities Side stepping;Backward walking;Marching forwards;Tandem walking    High Level Balance Comments At countertop completed the following balance exercises: tandem walking x 2 laps down and back with intermittent touch A to countertop. Then at countertop completed sidestepping, backwards walking, and alternating marching at countertop x 3 laps down and back with addition of red theraband, cues for proper  technique with completion but tolerating increase in resistance well.              Balance Exercises - 06/03/21 0001       Balance Exercises: Standing   Standing Eyes Opened Narrow base of support (BOS);Head turns;Foam/compliant surface   horizontal/vertical head turns x 10 reps each direction, educated to progress to two pillows for further challenge at home   Standing Eyes Closed Narrow base of support (BOS);Head turns;Foam/compliant surface;3 reps;30 secs    Standing Eyes Closed Limitations educated to progress to two pillows for further challenge at home    Tandem Stance Eyes open;2 reps;30 secs   alternating leading LE; increased challenge with RLE posterior            Completed review and updated the following HEP. Bolded include new instructions.  Access Code: [email or web address] URL: [email or web address] Date: 06/03/2021 Prepared by: Jethro Bastos  Exercises Sit to Stand on Foam Pad - 1 x daily - 7 x weekly - 2 sets - 10 reps Side Stepping with Resistance at Thighs and Counter Support - 1 x daily - 7 x weekly - 1 sets - 3 reps - Addition of Red Theraband Walking March - 1 x daily - 7 x weekly - 1 sets - 3 reps - Addition of Red Theraband Backward Monster Walk with Resistance at [employer] and Counter Support - 1 x daily - 7 x weekly - 1 sets - 3 reps - Addition of Red Theraband Romberg Stance Eyes Closed on Foam Pad - 1 x daily - 7 x weekly - 1 sets - 3 reps - 20 sec hold - progress to two pillows  Romberg Stance on Foam Pad with Head Rotation - 1 x daily - 7 x weekly - 2 sets - 10 reps progress to two pillows Wide Stance with Eyes Closed and Head Rotation on Foam Pad - 1 x daily - 7 x weekly - 2 sets - 10 reps Tandem Stance in Corner - 1 x daily - 7 x weekly - 1 sets - 3 reps - 25-30 seconds hold Tandem Walking with Counter Support - 1 x daily - 7 x weekly - 1 sets - 3 reps   PT Education - 06/03/21 1145     Education Details Updated HEP    Person(s) Educated  Patient    Methods Explanation;Demonstration;Handout    Comprehension Verbalized understanding;Returned demonstration;Verbal cues required              PT Short Term Goals - 06/03/21 1251       PT SHORT TERM GOAL #1   Title Pt will be independent with progressive HEP for strength and balance    Baseline Reports  independence;    Time 3    Period Weeks    Status Achieved    Target Date 06/04/21               PT Long Term Goals - 05/14/21 1224       PT LONG TERM GOAL #1   Title Pt will be independent with final HEP for strength and balance to continue gains on own. (All LTGs Due: 07/02/21)    Baseline reports independence; will benefit from progressive HEP    Time 7    Period Weeks    Status On-going    Target Date 07/02/21      PT LONG TERM GOAL #2   Title Pt will improve DGI to >/= 19/24 to indicate reduced fall risk    Baseline 17/24    Time 7    Period Weeks    Status New      PT LONG TERM GOAL #3   Title Pt will be able to ambulate x 115 ft with LRAD and scanning environment without LOB noted    Baseline increased challenge with horizontal head turns/scanning    Time 7    Period Weeks    Status New      PT LONG TERM GOAL #4   Title Pt will increase Berg to >50/56 for improved balance and decreased fall risk.    Baseline 03/04/21 42/56. 04/29/21 43/56    Time 7    Period Weeks    Status On-going                   Plan - 06/03/21 1249     Clinical Impression Statement Completed review and progression of HEP today during session. With high level balance patient tolerating progression with resistance well, intermittent seated rest breaks required. PT reviewing and updating HEP to patient's tolerance. Will continue to progress toward all LTGs.    Personal Factors and Comorbidities Comorbidity 3+    Comorbidities alcohol abuse, astma,anxiety/depression, HTN, holmes-adie syndrome    Examination-Activity Limitations Locomotion  Level;Transfers;Stairs;Stand    Examination-Participation Restrictions Community Activity;Occupation;Cleaning    Stability/Clinical Decision Making Evolving/Moderate complexity    Rehab Potential Good    PT Frequency 2x / week    PT Duration --   for 1 week, followed by 1x/week for 6 weeks   PT Treatment/Interventions ADLs/Self Care Home Management;DME Instruction;Gait training;Stair training;Functional mobility training;Therapeutic activities;Therapeutic exercise;Balance training;Neuromuscular re-education;Patient/family education;Vestibular;Visual/perceptual remediation/compensation    PT Next Visit Plan Continue to progress gait training without AD more. Gait with head turns.  Continue balance activities on compliant surface, narrow BOS. negotiating over obstacles.    Consulted and Agree with Plan of Care Patient             Patient will benefit from skilled therapeutic intervention in order to improve the following deficits and impairments:  Abnormal gait, Decreased balance, Decreased cognition, Decreased mobility, Decreased strength, Decreased coordination  Visit Diagnosis: Unsteadiness on feet  Other abnormalities of gait and mobility  Muscle weakness (generalized)     Problem List Patient Active Problem List   Diagnosis Date Noted   Seizure-like activity (HCC) 04/17/2021   Hypomagnesemia 02/10/2021   Generalized anxiety disorder 02/10/2021   Insomnia 02/10/2021   Fall due to seizure [facility](HCC) 02/10/2021   Head injury 02/10/2021   Macrocytic anemia 02/10/2021   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

## 2021-06-10 ENCOUNTER — Ambulatory Visit: Payer: No Typology Code available for payment source

## 2021-06-17 ENCOUNTER — Ambulatory Visit: Payer: No Typology Code available for payment source | Admitting: Physical Therapy

## 2021-06-18 ENCOUNTER — Ambulatory Visit: Payer: No Typology Code available for payment source

## 2021-06-18 ENCOUNTER — Other Ambulatory Visit: Payer: Self-pay

## 2021-06-18 DIAGNOSIS — M6281 Muscle weakness (generalized): Secondary | ICD-10-CM

## 2021-06-18 DIAGNOSIS — R2681 Unsteadiness on feet: Secondary | ICD-10-CM

## 2021-06-18 DIAGNOSIS — R2689 Other abnormalities of gait and mobility: Secondary | ICD-10-CM

## 2021-06-18 NOTE — Therapy (Signed)
[facility] [address] [place], [place], [address] Phone: [phone]   Fax:  [phone]  Physical Therapy Treatment  Patient Details  Name: Kelly Zuniga MRN: [id number] Date of Birth: Jun 29, 1966 Referring Provider (PT): Referred by Marlin CanaryJessica Vann (hospitalist) but PCP is Janeece Ageeichard Morrow.   Encounter Date: 06/18/2021   PT End of Session - 06/18/21 0804     Visit Number 16    Number of Visits 20    PT Start Time 0804    PT Stop Time 0845    PT Time Calculation (min) 41 min    Equipment Utilized During Treatment Gait belt    Activity Tolerance Patient tolerated treatment well    Behavior During Therapy Anxious             Past Medical History:  Diagnosis Date   Alcohol abuse    Allergy    Anemia    low iron   Anxiety    Asthma    Asthma    Phreesia 11/09/2020   Depression    posibly bipolar disorder. 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   Subjective Assessment - 06/18/21 0806     Subjective Reports had a eye doctor appoinment and eyes were dilated which why she needed to reschedule. Reports there is an issue with the retina in the L eye. Patient also reports that she had a fall last week beleives it was due to  seizure activity, denies injuries therefore did not go to hospital.    Pertinent History 55 y.o. female with medical history significant of alcohol abuse, astma,anxiety/depression, HTN, holmes-adie syndrome reprts 3-4 days of room spinning dizziness. At work today she had continued dizziness, hand cramping and weakness. She had a brief episode of LOC falling and striking posterior occiput. Coworkers witness some whole body shaking lasting several seconds. EMS activated and [facility].   CT head w/o acute findings, MRI brain w/o acute findings.    Patient Stated Goals Pt wants to improve her balance and be able to bend over and sit to without feeling like she will fall. Also wants to build up her strength.    Currently in Pain? No/denies    Pain Onset In the past 7 days    Pain Onset In the past 7 days                [facility] PT Assessment - 06/18/21 0001       Standardized Balance Assessment   Standardized Balance Assessment Berg Balance Test      Berg Balance Test   Sit to Stand Able to stand without using hands and stabilize independently  Standing Unsupported Able to stand safely 2 minutes    Sitting with Back Unsupported but Feet Supported on Floor or Stool Able to sit safely and securely 2 minutes    Stand to Sit Sits safely with minimal use of hands    Transfers Able to transfer safely, minor use of hands    Standing Unsupported with Eyes Closed Able to stand 10 seconds safely    Standing Unsupported with Feet Together Able to place feet together independently and stand 1 minute safely    From Standing, Reach Forward with Outstretched Arm Can reach forward >12 cm safely (5")    From Standing Position, Pick up Object from Floor Able to pick up shoe, needs supervision    From Standing Position, Turn to Look Behind Over each Shoulder Looks behind from both sides and weight shifts well    Turn 360 Degrees Able to turn 360 degrees safely in 4 seconds or  less    Standing Unsupported, Alternately Place Feet on Step/Stool Able to stand independently and safely and complete 8 steps in 20 seconds    Standing Unsupported, One Foot in Front Able to take small step independently and hold 30 seconds    Standing on One Leg Able to lift leg independently and hold 5-10 seconds    Total Score 51                           [facility] Adult PT Treatment/Exercise - 06/18/21 0001       Transfers   Transfers Sit to Stand;Stand to Sit    Sit to Stand 6: Modified independent (Device/Increase time)    Stand to Sit 6: Modified independent (Device/Increase time)      Ambulation/Gait   Ambulation/Gait Yes    Ambulation/Gait Assistance 5: Supervision    Ambulation/Gait Assistance Details throughout therapy gym with activities    Ambulation Distance (Feet) --   clinic distance   Assistive device None    Gait Pattern Step-through pattern;Decreased arm swing - right;Decreased arm swing - left;Decreased step length - right;Decreased step length - left;Decreased trunk rotation    Ambulation Surface Level;Indoor      High Level Balance   High Level Balance Activities Marching forwards;Tandem walking    High Level Balance Comments with blue mat on floor, completed marching forwards without UE support followed by stepping over obstalces (cone) with toe tap prior to steppover to promote SLS. Completed x 2 laps. Then progressed to tandem walking with stepping over obstalces (cone) with bilateral toe tap prior to steppover to promote SLS, x 2 laps/                 Balance Exercises - 06/18/21 0001       Balance Exercises: Standing   Standing Eyes Opened Narrow base of support (BOS);Head turns;Foam/compliant surface   horiz/vertical x 10 reps each direction; increased postural w/ vertical   Standing Eyes Closed Narrow base of support (BOS);Wide (BOA);Foam/compliant surface;3 reps;30 secs;Limitations    Standing Eyes Closed Limitations progressed  from wide BOS > narrow BOS, CGA. increased postural sway    Tandem Stance Eyes open;Limitations    Tandem Stance Time completed 3/4th tandem with eyes open and horizotnal head turns x 10 reps each direction.    SLS with Vectors Foam/compliant surface;Limitations    SLS with Vectors Limitations standing at staris completed forward alternating toe taps x 10 reps, then crossover x 10 reps to 1st stair. Second set  completed toe taps forward x 10 reps bilat to 2nd step. increased challenge to second step.                 PT Short Term Goals - 06/03/21 1251       PT SHORT TERM GOAL #1   Title Pt will be independent with progressive HEP for strength and balance    Baseline Reports independence;    Time 3    Period Weeks    Status Achieved    Target Date 06/04/21               PT Long Term Goals - 06/18/21 0810       PT LONG TERM GOAL #1   Title Pt will be independent with final HEP for strength and balance to continue gains on own. (All LTGs Due: 07/02/21)    Baseline reports independence; will benefit from progressive HEP    Time 7    Period Weeks    Status On-going      PT LONG TERM GOAL #2   Title Pt will improve DGI to >/= 19/24 to indicate reduced fall risk    Baseline 17/24    Time 7    Period Weeks    Status New      PT LONG TERM GOAL #3   Title Pt will be able to ambulate x 115 ft with LRAD and scanning environment without LOB noted    Baseline increased challenge with horizontal head turns/scanning    Time 7    Period Weeks    Status New      PT LONG TERM GOAL #4   Title Pt will increase Berg to >50/56 for improved balance and decreased fall risk.    Baseline 03/04/21 42/56. 04/29/21 43/56; 51/56    Time 7    Period Weeks    Status Achieved                   Plan - 06/18/21 0854     Clinical Impression Statement Today's skilled PT session included assesment of Berg Balance, patient scored 51/56 today demonstrating improved balance, reduced  fall risk, and ability to meet LTG #4. Rest of session focused on continued balance activities with focus on SLS and complaint surface. Will continue to progress toward all LTGs.    Personal Factors and Comorbidities Comorbidity 3+    Comorbidities alcohol abuse, astma,anxiety/depression, HTN, holmes-adie syndrome    Examination-Activity Limitations Locomotion Level;Transfers;Stairs;Stand    Examination-Participation Restrictions Community Activity;Occupation;Cleaning    Stability/Clinical Decision Making Evolving/Moderate complexity    Rehab Potential Good    PT Frequency 2x / week    PT Duration --   for 1 week, followed by 1x/week for 6 weeks   PT Treatment/Interventions ADLs/Self Care Home Management;DME Instruction;Gait training;Stair training;Functional mobility training;Therapeutic activities;Therapeutic exercise;Balance training;Neuromuscular re-education;Patient/family education;Vestibular;Visual/perceptual remediation/compensation    PT Next Visit Plan Check LTGs, Update HEP + D/C    Consulted and Agree with Plan of Care Patient             Patient will benefit from skilled therapeutic intervention in order to improve the following deficits and impairments:  Abnormal gait, Decreased balance, Decreased cognition, Decreased mobility, Decreased strength, Decreased coordination  Visit Diagnosis: Unsteadiness on feet  Muscle weakness (generalized)  Other abnormalities of gait and mobility     Problem List Patient Active Problem List   Diagnosis Date Noted   Seizure-like activity (HCC) 04/17/2021   Hypomagnesemia 02/10/2021   Generalized  anxiety disorder 02/10/2021   Insomnia 02/10/2021   Fall due to seizure [facility]) 02/10/2021   Head injury 02/10/2021   Macrocytic anemia 02/10/2021   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 [place], [place], [address] Phone: [phone]   Fax:  [phone]  Name: Javeah Loeza MRN: [id number] Date of Birth: 1966-11-29

## 2021-06-19 ENCOUNTER — Ambulatory Visit: Payer: No Typology Code available for payment source

## 2021-06-20 ENCOUNTER — Encounter: Payer: Self-pay | Admitting: *Deleted

## 2021-06-24 ENCOUNTER — Other Ambulatory Visit: Payer: Self-pay

## 2021-06-24 ENCOUNTER — Ambulatory Visit (HOSPITAL_COMMUNITY): Payer: No Typology Code available for payment source | Attending: Cardiology

## 2021-06-24 ENCOUNTER — Ambulatory Visit: Payer: No Typology Code available for payment source | Admitting: Physical Therapy

## 2021-06-24 DIAGNOSIS — R9431 Abnormal electrocardiogram [ECG] [EKG]: Secondary | ICD-10-CM | POA: Diagnosis not present

## 2021-06-24 LAB — ECHOCARDIOGRAM COMPLETE
Area-P 1/2: 5.31 cm2
S' Lateral: 2.4 cm

## 2021-06-27 ENCOUNTER — Other Ambulatory Visit: Payer: Self-pay

## 2021-06-27 ENCOUNTER — Ambulatory Visit: Payer: No Typology Code available for payment source | Attending: Internal Medicine

## 2021-06-27 DIAGNOSIS — R2681 Unsteadiness on feet: Secondary | ICD-10-CM | POA: Diagnosis present

## 2021-06-27 DIAGNOSIS — R2689 Other abnormalities of gait and mobility: Secondary | ICD-10-CM | POA: Insufficient documentation

## 2021-06-27 DIAGNOSIS — M6281 Muscle weakness (generalized): Secondary | ICD-10-CM | POA: Insufficient documentation

## 2021-06-27 NOTE — Therapy (Signed)
[facility] [address] [place], [name], [address] Phone: [phone]   Fax:  [phone]  Physical Therapy Treatment/Discharge Summary  Patient Details  Name: Kelly Zuniga MRN: [id number] Date of Birth: 06-06-1966 Referring Provider (PT): Referred by Eulogio Bear (hospitalist) but PCP is Maximiano Coss.  PHYSICAL THERAPY DISCHARGE SUMMARY  Visits from Start of Care: 17  Current functional level related to goals / functional outcomes: See Clinical Impression Statement   Remaining deficits: Low Fall Risk   Education / Equipment: HEP provided   Patient agrees to discharge. Patient goals were met. Patient is being discharged due to meeting the stated rehab goals.  Encounter Date: 06/27/2021   PT End of Session - 06/27/21 0808     Visit Number 17    Number of Visits 20    PT Start Time 0807    PT Stop Time 0826    PT Time Calculation (min) 19 min    Equipment Utilized During Treatment Gait belt    Activity Tolerance Patient tolerated treatment well    Behavior During Therapy Anxious             Past Medical History:  Diagnosis Date   Alcohol abuse    Allergy    Anemia    low iron   Anxiety    Asthma    Asthma    [facility] 11/09/2020   Depression    posibly bipolar disorder. 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   Subjective Assessment - 06/27/21 0808     Subjective Patient reports echocardiogram, according to patient went well. No other issues. Did have an episode on tuesday with some vomitting/nausea. No falls.    Pertinent History 55 y.o. female with medical history significant of alcohol abuse, astma,anxiety/depression, HTN, holmes-adie syndrome reprts 3-4 days of room spinning dizziness. At work today she had continued dizziness, hand cramping and weakness. She had a brief episode of LOC falling and striking posterior occiput. Coworkers witness some whole body shaking lasting several seconds. EMS activated and patient transported to [facility] for evaluation.   CT head w/o acute findings, MRI brain w/o acute findings.    Patient Stated Goals Pt wants to improve her balance and be able to bend over and sit to without feeling like she will fall. Also wants to build up her strength.    Currently in Pain? No/denies    Pain Onset In the past 7 days    Pain Onset In the past 7 days               [facility] Adult PT Treatment/Exercise - 06/27/21 0001       Transfers  Transfers Sit to Stand;Stand to Sit    Sit to Stand 6: Modified independent (Device/Increase time)    Stand to Sit 6: Modified independent (Device/Increase time)      Ambulation/Gait   Ambulation/Gait Yes    Ambulation/Gait Assistance 5: Supervision    Ambulation/Gait Assistance Details with activities in therapy gym    Assistive device None    Gait Pattern Step-through pattern;Decreased arm swing - right;Decreased arm swing - left;Decreased step length - right;Decreased step length - left;Decreased trunk rotation    Ambulation Surface Level;Indoor      Standardized Balance Assessment   Standardized Balance Assessment Dynamic Gait Index      Dynamic Gait Index   Level Surface Mild Impairment    Change in Gait Speed Normal     Gait with Horizontal Head Turns Mild Impairment    Gait with Vertical Head Turns Normal    Gait and Pivot Turn Normal    Step Over Obstacle Normal    Step Around Obstacles Normal    Steps Mild Impairment    Total Score 21      High Level Balance   High Level Balance Activities Head turns    High Level Balance Comments Completed ambulation x 400' with visual scanning R/L and up/down on command. mild veering no LOB able to maintain balance without assistance from PT and no AD.             Completed verbal review of HEP:  Access Code: [email or web address] URL: [email or web address] Date: 06/03/2021 Prepared by: Baldomero Lamy   Exercises Sit to Stand on Foam Pad - 1 x daily - 7 x weekly - 2 sets - 10 reps Side Stepping with Resistance at Thighs and Counter Support - 1 x daily - 7 x weekly - 1 sets - 3 reps Walking March - 1 x daily - 7 x weekly - 1 sets - 3 reps Backward Monster Walk with Resistance at [employer] and Counter Support - 1 x daily - 7 x weekly - 1 sets - 3 reps Romberg Stance Eyes Closed on Foam Pad - 1 x daily - 7 x weekly - 1 sets - 3 reps - 20 sec hold Romberg Stance on Foam Pad with Head Rotation - 1 x daily - 7 x weekly - 2 sets - 10 reps Wide Stance with Eyes Closed and Head Rotation on Foam Pad - 1 x daily - 7 x weekly - 2 sets - 10 reps Tandem Stance in Corner - 1 x daily - 7 x weekly - 1 sets - 3 reps - 25-30 seconds hold Tandem Walking with Counter Support - 1 x daily - 7 x weekly - 1 sets - 3 reps       PT Education - 06/27/21 0830     Education Details review of HEP; progress toward STGs    Person(s) Educated Patient    Methods Explanation    Comprehension Verbalized understanding              PT Short Term Goals - 06/03/21 1251       PT SHORT TERM GOAL #1   Title Pt will be independent with progressive HEP for strength and balance    Baseline Reports independence;    Time 3    Period Weeks    Status Achieved    Target Date 06/04/21                PT Long Term Goals - 06/27/21 1062  PT LONG TERM GOAL #1   Title Pt will be independent with final HEP for strength and balance to continue gains on own. (All LTGs Due: 07/02/21)    Baseline reports independence; will benefit from progressive HEP; reports independence with progressive HEP    Time 7    Period Weeks    Status Achieved      PT LONG TERM GOAL #2   Title Pt will improve DGI to >/= 19/24 to indicate reduced fall risk    Baseline 17/24; 21/24    Time 7    Period Weeks    Status Achieved      PT LONG TERM GOAL #3   Title Pt will be able to ambulate x 115 ft with LRAD and scanning environment without LOB noted    Baseline increased challenge with horizontal head turns/scanning; 200' ft with scanning mild veer no LOB, Mod I    Time 7    Period Weeks    Status Achieved      PT LONG TERM GOAL #4   Title Pt will increase Berg to >50/56 for improved balance and decreased fall risk.    Baseline 03/04/21 42/56. 04/29/21 43/56; 51/56    Time 7    Period Weeks    Status Achieved                   Plan - 06/27/21 0831     Clinical Impression Statement Today's skilled PT session focused on assesment of progress toward all LTGs. Patient able to meet all LTGs, demonstrating improved balance and functional mobility. Patient scored 21/24 on DGI, and 51/56 on Berg Balance indicating low fall risk. Improved steadiness iwth ambulation with AD noted. Patient has made significnat progress with PT services and demo readiness for d/c at this time, with patient verbalizing agreement.    Personal Factors and Comorbidities Comorbidity 3+    Comorbidities alcohol abuse, astma,anxiety/depression, HTN, holmes-adie syndrome    Examination-Activity Limitations Locomotion Level;Transfers;Stairs;Stand    Examination-Participation Restrictions Community Activity;Occupation;Cleaning    Stability/Clinical Decision Making Evolving/Moderate complexity    Rehab Potential  Good    PT Frequency 2x / week    PT Duration --   for 1 week, followed by 1x/week for 6 weeks   PT Treatment/Interventions ADLs/Self Care Home Management;DME Instruction;Gait training;Stair training;Functional mobility training;Therapeutic activities;Therapeutic exercise;Balance training;Neuromuscular re-education;Patient/family education;Vestibular;Visual/perceptual remediation/compensation    Consulted and Agree with Plan of Care Patient             Patient will benefit from skilled therapeutic intervention in order to improve the following deficits and impairments:  Abnormal gait, Decreased balance, Decreased cognition, Decreased mobility, Decreased strength, Decreased coordination  Visit Diagnosis: Unsteadiness on feet  Muscle weakness (generalized)  Other abnormalities of gait and mobility     Problem List Patient Active Problem List   Diagnosis Date Noted   Seizure-like activity ([facility]) 04/17/2021   Hypomagnesemia 02/10/2021   Generalized anxiety disorder 02/10/2021   Insomnia 02/10/2021   Fall due to seizure [facility]) 02/10/2021   Head injury 02/10/2021   Macrocytic anemia 02/10/2021   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 [place], [name], [address] Phone: [phone]   Fax:  [phone]  Name: Kelly Zuniga MRN: [id number] Date of Birth: 04/28/1966

## 2021-07-11 ENCOUNTER — Encounter: Payer: Self-pay | Admitting: *Deleted

## 2021-07-14 ENCOUNTER — Ambulatory Visit (INDEPENDENT_AMBULATORY_CARE_PROVIDER_SITE_OTHER): Payer: No Typology Code available for payment source | Admitting: Neurology

## 2021-07-14 ENCOUNTER — Encounter: Payer: Self-pay | Admitting: Neurology

## 2021-07-14 VITALS — BP 113/67 | HR 123 | Ht 60.0 in | Wt 84.0 lb

## 2021-07-14 DIAGNOSIS — F1910 Other psychoactive substance abuse, uncomplicated: Secondary | ICD-10-CM

## 2021-07-14 DIAGNOSIS — R569 Unspecified convulsions: Secondary | ICD-10-CM | POA: Diagnosis not present

## 2021-07-14 NOTE — Progress Notes (Signed)
PATIENT: Kelly Zuniga DOB: 08/15/1966  REASON FOR VISIT: follow up seizure like episodes  HISTORY FROM: patient, significant other Primary Neurologist: Dr. Terrace Arabia  HISTORY Kelly Zuniga, is a 55 year old female, seen in request by of primary care nurse practitioner [name], for evaluation of passing out spells, initial evaluation was on April 17, 2021   I reviewed and summarized the referring note. PMHX. Asthma Depression, anxiety,  Smoking History of alcohol abuse,     She currently works as of Occupational psychologist at [employer], on February 09, 2021, she was taken to hospital by ambulance, that morning she started working at 6 AM, about 10:30 AM, 30 minutes before the end of her shift, she remember hanging some cloth on the rack in a standing position, then she had sudden onset loss of consciousness, she denies warning signs, ambulance was called, there was reported seizure-like activity per ER record, she denies tongue biting, urinary incontinence,   Extensive evaluation at hospital, MRI of the brain without contrast showed no acute abnormality, mild atrophy, small vessel disease, small right parietal scalp hematoma   She was not able to go back to work ever since, patient reported 2 more episode of passing out, but was not able to elaborate on details, it happened without witness, 1 episode she woke up feel confused, seems to have a spell, the other episode she woke up fell to the ground for unknown length of time,   She had a history of alcohol abuse, 2014 hospital admission for alcohol cessation, alcohol level was 259 at that time, at this time less than 10, she denied excessive alcohol use, only reported mild use, UDS was negative, laboratory evaluation showed no other treatable etiology,   Appointment with his cardiologist is pending may   She was given Keppra 500 mg grams twice daily since hospital admission, tolerating medication well, long history of migraine headaches, taking  Topamax 25 mg twice a day, tolerating it well, no longer has frequent headaches  Update July 14, 2021 SS: Keppra stopped last visit, maximizing Topamax 100 mg twice daily.   MRI of the brain with and without contrast in May 2022 showed mild cortical atrophy, supratentorium small vessel disease, after contrast there was incidental note of small developmental venous anomaly in the left parietal lobe, is a normal anatomic variant, no acute change from previous MRI in February 2022  EEG was normal June 2022, but there was evidence of tachycardia, seeing cardiology.  Has seen cardiology, Dr. Bjorn Pippin 05/30/21, 7 day heart monitor showed no significant rhythm abnormalities, ECHO no significant abnormalities.  Here today with boyfriend, 5-7 spells, 2 days ago was in the bed, woke up the next morning, told her boyfriend she thinks she had a spell during the night, she remembers waking up was downstairs 6 AM, her left side body hurt, rib cage, thinks hit table, had bruising to ribs, had bitten her tongue and cheek, spit out blood, went back to sleep. This was first time oral injury.   Another spell few weeks ago, in the morning, walking to watch TV, her boyfriend found her on the floor. He hasn't witnessed any of these spells. Have 2 children 58, 19 year old. Kids haven't seen any spells.   Drinking 2-3 drinks of bourbon daily, few days drinks more than that. Weight was 97 lbs 04/17/21, now is 84 lbs. Questions about getting disability, isn't driving.   REVIEW OF SYSTEMS: Out of a complete 14 system review of symptoms, the patient complains only  of the following symptoms, and all other reviewed systems are negative.  Seizures   ALLERGIES: Allergies  Allergen Reactions   Acetaminophen Other (See Comments)    Makes LFT get elevated and causes liver problems/issues    Lithium Other (See Comments)    Causes the patient to have no appetite   Promethazine-Codeine Nausea And Vomiting and Other (See  Comments)    Any codeine-based cough syrups cause N/V   Codeine Nausea And Vomiting   Morphine And Related Nausea And Vomiting    HOME MEDICATIONS: Outpatient Medications Prior to Visit  Medication Sig Dispense Refill   albuterol (PROVENTIL) (2.5 MG/3ML) 0.083% nebulizer solution Take 2.5 mg by nebulization every 6 (six) hours as needed for wheezing or shortness of breath.     albuterol (VENTOLIN HFA) 108 (90 Base) MCG/ACT inhaler Inhale 2 puffs into the lungs See admin instructions. Inhale 2 puffs into the lungs every 4-6 hours as needed for shortness of breath or wheezing     B Complex-C (SUPER B COMPLEX/VITAMIN C PO) Take 1 tablet by mouth daily. With Folic Acid     BIOTIN PO Take 1 tablet by mouth in the morning.     cetirizine (ZYRTEC) 10 MG tablet Take 10 mg by mouth daily.     clonazePAM (KLONOPIN) 0.5 MG tablet Take 1 tablet (0.5 mg total) by mouth 3 (three) times daily as needed for anxiety. 90 tablet 2   escitalopram (LEXAPRO) 10 MG tablet Take 10 mg by mouth 2 (two) times daily.     magnesium gluconate (MAGONATE) 500 MG tablet Take 500 mg by mouth daily.     Multiple Vitamins-Minerals (MULTI ADULT GUMMIES) CHEW Chew 2 tablets by mouth in the morning.     naproxen sodium (ALEVE) 220 MG tablet Take 220 mg by mouth 2 (two) times daily as needed (for mild pain).     thiamine 100 MG tablet Take 1 tablet (100 mg total) by mouth daily. 30 tablet 0   topiramate (TOPAMAX) 100 MG tablet Take 1 tablet (100 mg total) by mouth 2 (two) times daily. 60 tablet 6   triamcinolone (NASACORT) 55 MCG/ACT nasal inhaler Place 2 sprays into the nose daily. For allergies 1 Inhaler 11   No facility-administered medications prior to visit.    PAST MEDICAL HISTORY: Past Medical History:  Diagnosis Date   Alcohol abuse    Allergy    Anemia    low iron   Anxiety    Asthma    Asthma    [facility]ia 11/09/2020   Depression    posibly bipolar disorder. 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   Hypertension    never on meds    Pneumonia    at 13 years   Seizure ([facility]) 02/09/2021   Seizure ([facility])    Substance abuse ([facility])    [facility] 11/09/2020    PAST SURGICAL HISTORY: Past Surgical History:  Procedure Laterality Date   benign cyst     removed from left cheek   RADIOLOGY WITH ANESTHESIA N/A 04/15/2021   Procedure: MRI/CT OF ABDOMEN WITH ANESTHESIA;  Surgeon: Radiologist, Medication, MD;  Location: [facility];  Service: Radiology;  Laterality: N/A;   TONSILECTOMY, ADENOIDECTOMY, BILATERAL MYRINGOTOMY AND TUBES      FAMILY HISTORY: Family History  Problem Relation Age of Onset   Coronary artery disease  Other        1st degree relative female <50   Depression Other        Family Hx   Diabetes Other        Family Hx 1st degree relative   Ovarian cancer Other        Family Hx    Healthy Mother    Diabetes Father    Gout Father    Hypertension Father    Heart failure Father    Cancer Father    Heart attack Father     SOCIAL HISTORY: Social History   Socioeconomic History   Marital status: Significant Other    Spouse name: Not on file   Number of children: 0   Years of education: college   Highest education level: Professional school degree (e.g., MD, DDS, DVM, JD)  Occupational History   Occupation: Occupational psychologistmerchandiser  Tobacco Use   Smoking status: Every Day    Packs/day: 0.25    Types: Cigarettes   Smokeless tobacco: Never   Tobacco comments:    Using the E cigarette to help her quit & still smoking  Vaping Use   Vaping Use: Former  Substance and Sexual Activity   Alcohol use: Yes    Comment: 1 glass of wine per day or less   Drug use: No   Sexual activity: Not on file  Other Topics Concern   Not on file  Social History Narrative   Lives with significant other and his two children.   Right-handed.   No daily caffeine.    Social Determinants of Health   Financial Resource Strain: Not on file  Food Insecurity: Not on file  Transportation Needs: Not on file  Physical Activity: Not on file  Stress: Not on file  Social Connections: Not on file  Intimate Partner Violence: Not on file   PHYSICAL EXAM  Vitals:   07/14/21 1336  BP: 113/67  Pulse: (!) 123  Weight: 84 lb (38.1 kg)  Height: 5' (1.524 m)   Body mass index is 16.41 kg/m.  Generalized: Thin appearing, frail  Neurological examination  Mentation: Alert oriented to time, place, history taking. Follows all commands speech and language fluent Cranial nerve II-XII: Pupils were equal round reactive to light. Extraocular movements were full, visual field were full on confrontational test. Facial sensation and strength were normal. Head turning and shoulder shrug  were normal and symmetric. Motor: Good strength to all extremities Sensory: Sensory testing is intact to soft touch on all 4 extremities. No evidence of extinction is noted.  Coordination: normal, but is slow to perform  Gait and station: slow to rise from seated position, cautious gait, relies on cane  Reflexes: Deep tendon reflexes are symmetric and normal bilaterally.   DIAGNOSTIC DATA (LABS, IMAGING, TESTING) - I reviewed patient records, labs, notes, testing and imaging myself where available.  Lab Results  Component Value Date   WBC 5.9 05/30/2021   HGB 12.0 05/30/2021   HCT 33.9 (L) 05/30/2021   MCV 98 (H) 05/30/2021   PLT 223 05/30/2021      Component Value Date/Time   NA 143 05/30/2021 1035   K 3.8 05/30/2021 1035   CL 98 05/30/2021 1035   CO2 22 05/30/2021 1035   GLUCOSE 95 05/30/2021 1035   GLUCOSE 81 04/15/2021 0652   BUN 10 05/30/2021 1035   CREATININE 0.72 05/30/2021 1035   CALCIUM 10.0 05/30/2021 1035   PROT 6.9 05/30/2021 1035   ALBUMIN 4.9 05/30/2021 1035  AST 169 (H) 05/30/2021 1035   ALT 99 (H) 05/30/2021 1035   ALKPHOS 97 05/30/2021 1035   BILITOT  0.5 05/30/2021 1035   GFRNONAA >60 04/15/2021 0652   GFRAA 90 11/12/2020 1554   Lab Results  Component Value Date   CHOL 232 (H) 11/12/2020   HDL 88 11/12/2020   LDLCALC 131 (H) 11/12/2020   TRIG 77 11/12/2020   CHOLHDL 2.6 11/12/2020   Lab Results  Component Value Date   HGBA1C 4.9 11/12/2020   Lab Results  Component Value Date   VITAMINB12 334 02/24/2021   Lab Results  Component Value Date   TSH 2.550 05/30/2021   ASSESSMENT AND PLAN 55 y.o. year old female  has a past medical history of Alcohol abuse, Allergy, Anemia, Anxiety, Asthma, Asthma, Depression, Depression, Headache(784.0), Holmes-Adie syndrome (02/26/2012), Hypertension, Pneumonia, Seizure ([facility]) (02/09/2021), Seizure ([facility]), and Substance abuse ([facility]). here with:  Sudden onset loss of consciousness History of alcohol abuse, depression, anxiety (on Lexapro, Klonopin) Chronic migraine headaches  -reported seizure activity, however no spells have been witnessed, total of 5-7, most recent was 2 days ago, 1st time oral injury -referral to EMU with Dr. Melynda Ripple, increased spells despite maximizing Topamax 100 mg twice daily -check Topamax level, ETOH, hepatic function panel; there is near daily alcohol intake reported  (June 2022 AST 169, ALT 99) -depending on labs, consider switch to Keppra or Lamictal, due to weight loss on Topamax was 97 lbs in April, now 84 lbs, worry about her ETOH intake -no driving until seizure free 6 months  -has seen cardiology, 7 day heart monitor, echo unrevealing, next appointment is in August, today HR 123, question syncope vs seizure? -MRI of the brain with and without contrast in May 2022 showed mild cortical atrophy, supratentorium small vessel disease, after contrast there was incidental note of small developmental venous anomaly in the left parietal lobe, is a normal anatomic variant, no acute change from previous MRI in February 2022 -EEG was normal June 2022, but there was evidence of  tachycardia -Document all spells, return to clinic in 3-4 months with Dr. Terrace Arabia                I spent 45 minutes of face-to-face and non-face-to-face time with patient.  This included previsit chart review, lab review, study review, order entry, discussing seizure spells, medications, labs, management and follow-up.  Margie Ege, AGNP-C, DNP 07/14/2021, 8:06 PM [facility] [address], [address] [place], [place] [address] [phone]

## 2021-07-14 NOTE — Patient Instructions (Addendum)
Check labs today  Referral to EMU with Dr. Melynda Ripple for seizure like spells  For now continue the Topamax  No driving due to spells  See you back in 3-4 months with Dr. Terrace Arabia Document spells

## 2021-07-15 LAB — HEPATIC FUNCTION PANEL
ALT: 81 IU/L — ABNORMAL HIGH (ref 0–32)
AST: 166 IU/L — ABNORMAL HIGH (ref 0–40)
Albumin: 4.7 g/dL (ref 3.8–4.9)
Alkaline Phosphatase: 116 IU/L (ref 44–121)
Bilirubin Total: 0.5 mg/dL (ref 0.0–1.2)
Bilirubin, Direct: 0.3 mg/dL (ref 0.00–0.40)
Total Protein: 6.8 g/dL (ref 6.0–8.5)

## 2021-07-15 LAB — TOPIRAMATE LEVEL: Topiramate Lvl: 16.3 ug/mL (ref 2.0–25.0)

## 2021-07-15 LAB — ETHANOL: Ethanol: 0.107 %

## 2021-07-16 ENCOUNTER — Telehealth: Payer: Self-pay | Admitting: Neurology

## 2021-07-16 NOTE — Telephone Encounter (Signed)
Labs returned, Topamax level is therapeutic at 16.3, hepatic function panel showed continued elevated liver function AST 166, ALT 81. After discussing with Dr. Terrace Arabia, will plan to send to EMU for monitoring, recurrent spells despite maximizing Topamax, concerned with her ETOH use. Will route this to Dr. Terrace Arabia to ensure nothing further to add, of note, has had weight loss down to 84 lbs since last visit, possibly Topamax related, other factor is ETOH use. Sees cardiology again next month.

## 2021-07-16 NOTE — Telephone Encounter (Signed)
the most common pattern of liver biochemical test abnormalities in ALD is a disproportionate elevation of the AST compared with the ALT, resulting in a ratio greater than one    Alcohol level was 0.107, her elevated AST, all  suggest that alcohol played a major role in her complaints of passing out spells,  Keep current dose of Topamax, I have efaxed the result to her Kelly Agee, NP   Please hold off EMG monitoring now.  Richard:  We saw patient on July 25th 2022. She continues to report unwitnessed frequent passing out spells, despite therapeutic topamax level.  She also reported a long history of alcohol use, the pattern of abnormal liver functional test did refect that.  I think she will benefit from alcohol cessation program the most, will hold off EMU monitoring refer.

## 2021-07-21 ENCOUNTER — Ambulatory Visit (HOSPITAL_BASED_OUTPATIENT_CLINIC_OR_DEPARTMENT_OTHER): Payer: No Typology Code available for payment source | Admitting: Cardiology

## 2021-08-04 NOTE — Progress Notes (Signed)
Cardiology Office Note:    Date:  08/06/2021   ID:  Kelly Zuniga, DOB 1966-10-10, MRN [id number]  PCP:  [name], NP  Cardiologist:  None  Referring MD: [name], NP   Chief Complaint  Patient presents with   Shortness of Breath      History of Present Illness:    Kelly Zuniga is a 55 y.o. female with a hx of alcohol abuse, anemia, anxiety, asthma, depression, hypertension , and possible seizures who presents for follow-up.  She was seen as a new consult at the request of [name], NP for the evaluation and management of syncope on 05/30/2021.  Reports had episode in April 202 while walking her dog where she had sudden lightheadedness and lost balance and fell to the ground.  She did not lose consciousness.  Reports has had 2 similar episodes recently occurred with changing positions, from sitting to standing she felt lightheaded and fell to the ground.  Other episode occurred as she was walking through her home.  She has been seen by neurology, MRI of the brain showed no acute abnormalities.  EEG unremarkable for seizure but noted to have tachycardia with heart rate 112.  She reports she has been doing physical therapy.  Denies any chest pain but does get short of breath.  Reports has been having palpitations, occurs about 4 times per week, last for few minutes.  Denies any lower extremity edema.  Smoked up to 1 pack/day since age 40, currently 3 to 8 cigarettes/day.  Reports BP has been 120s to 140s when checks at home.  Family history includes father died of MI in 53s.  Echo 07-16-21 shows normal biventricular function, grade 1 diastolic dysfunction, no significant valvular disease.  Zio patch x7 days on 06/19/2021 showed no significant abnormalities, did have episodes of Wenckebach.  Since last clinic visit, she reports that she continues to have dizziness but denies any syncopal episodes.  Continues to have seizures, follows with neurology.  Pending referral to  specialist.  She reports having dyspnea with exertion.  Denies any chest pain.  Particularly notices shortness of breath with walking up stairs.  Reports palpitations with exertion.  Denies any lower extremity edema.  Continues to smoke, 0.25 PPD.  BP Readings from Last 3 Encounters:  08/06/21 (!) 100/56  07/14/21 113/67  06/03/21 (!) 142/88      Past Medical History:  Diagnosis Date   Alcohol abuse    Allergy    Anemia    low iron   Anxiety    Asthma    Asthma    Phreesia 11/09/2020   Depression    posibly bipolar disorder. 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   Hypertension    never on meds    Pneumonia    at 13 years   Seizure (HCC) 02/09/2021   Seizure (HCC)    Substance abuse (HCC)    Phreesia 11/09/2020    Past Surgical History:  Procedure Laterality Date   benign cyst     removed from left cheek   RADIOLOGY WITH ANESTHESIA N/A 04/15/2021   Procedure: MRI/CT OF ABDOMEN WITH ANESTHESIA;  Surgeon: Radiologist, Medication, MD;  Location: [facility] OR;  Service: Radiology;  Laterality: N/A;   TONSILECTOMY, ADENOIDECTOMY, BILATERAL MYRINGOTOMY AND TUBES      Current Medications: Current Outpatient Medications on  File Prior to Visit  Medication Sig   albuterol (PROVENTIL) (2.5 MG/3ML) 0.083% nebulizer solution Take 2.5 mg by nebulization every 6 (six) hours as needed for wheezing or shortness of breath.   albuterol (VENTOLIN HFA) 108 (90 Base) MCG/ACT inhaler Inhale 2 puffs into the lungs See admin instructions. Inhale 2 puffs into the lungs every 4-6 hours as needed for shortness of breath or wheezing   B Complex-C (SUPER B COMPLEX/VITAMIN C PO) Take 1 tablet by mouth daily. With Folic Acid   BIOTIN PO Take 1 tablet by mouth in the morning.   cetirizine (ZYRTEC) 10 MG tablet Take 10 mg by mouth daily.   clonazePAM (KLONOPIN)  0.5 MG tablet Take 1 tablet (0.5 mg total) by mouth 3 (three) times daily as needed for anxiety.   escitalopram (LEXAPRO) 10 MG tablet Take 10 mg by mouth 2 (two) times daily.   magnesium gluconate (MAGONATE) 500 MG tablet Take 500 mg by mouth daily.   Multiple Vitamins-Minerals (MULTI ADULT GUMMIES) CHEW Chew 2 tablets by mouth in the morning.   naproxen sodium (ALEVE) 220 MG tablet Take 220 mg by mouth 2 (two) times daily as needed (for mild pain).   thiamine 100 MG tablet Take 1 tablet (100 mg total) by mouth daily.   topiramate (TOPAMAX) 100 MG tablet Take 1 tablet (100 mg total) by mouth 2 (two) times daily.   triamcinolone (NASACORT) 55 MCG/ACT nasal inhaler Place 2 sprays into the nose daily. For allergies   No current facility-administered medications on file prior to visit.     Allergies:   Acetaminophen, Lithium, Promethazine-codeine, Codeine, and Morphine and related   Social History   Tobacco Use   Smoking status: Every Day    Packs/day: 0.25    Types: Cigarettes   Smokeless tobacco: Never  Vaping Use   Vaping Use: Former  Substance Use Topics   Alcohol use: Yes    Comment: 1 glass of wine per day or less   Drug use: No    Family History: family history includes Cancer in her father; Coronary artery disease in an other family member; Depression in an other family member; Diabetes in her father and another family member; Gout in her father; Healthy in her mother; Heart attack in her father; Heart failure in her father; Hypertension in her father; Ovarian cancer in an other family member.  ROS:   Please see the history of present illness.  Additional pertinent ROS: Constitutional: Negative for chills, fever, night sweats, unintentional weight loss  HENT: Negative for ear pain and hearing loss.   Eyes: Negative for loss of vision and eye pain.  Respiratory: Negative for cough, sputum, wheezing.   Cardiovascular: See HPI. Gastrointestinal: Negative for abdominal pain,  melena, and hematochezia.  Genitourinary: Negative for dysuria and hematuria.  Musculoskeletal: Negative for falls and myalgias.  Skin: Negative for itching and rash.  Neurological: Negative for focal weakness, focal sensory changes and loss of consciousness.  Endo/Heme/Allergies: Does not bruise/bleed easily.     EKGs/Labs/Other Studies Reviewed:    The following studies were reviewed today:   EKG:  EKG is personally reviewed.   08/06/21: sinus tachycardia, rate 120, inferior Q waves, LAD 05/30/21: sinus tachycardia, rate 119, LAD, inferior Q waves  Recent Labs: 02/24/2021: BNP 43.7; Magnesium 1.6 05/30/2021: BUN 10; Creatinine, Ser 0.72; Hemoglobin 12.0; Platelets 223; Potassium 3.8; Sodium 143; TSH 2.550 07/14/2021: ALT 81  Recent Lipid Panel    Component Value Date/Time   CHOL 232 (H) 11/12/2020  1554   TRIG 77 11/12/2020 1554   HDL 88 11/12/2020 1554   CHOLHDL 2.6 11/12/2020 1554   CHOLHDL 3 01/27/2011 1017   VLDL 11.4 01/27/2011 1017   LDLCALC 131 (H) 11/12/2020 1554    Physical Exam:    VS:  BP (!) 100/56 (BP Location: Left Arm, Patient Position: Sitting, Cuff Size: Small)   Pulse (!) 120   Ht 5' (1.524 m)   Wt 80 lb 3.2 oz (36.4 kg)   LMP 11/04/2013   SpO2 97%   BMI 15.66 kg/m     Wt Readings from Last 3 Encounters:  08/06/21 80 lb 3.2 oz (36.4 kg)  07/14/21 84 lb (38.1 kg)  05/30/21 89 lb 3.2 oz (40.5 kg)    GEN: in no acute distress HEENT: normal Neck: no JVD, carotid bruits Cardiac: tachycardic, regular, no murmurs, rubs, or gallops,no edema  Respiratory:  clear to auscultation bilaterally, normal work of breathing GI: soft, nontender, nondistended, + BS MS: no deformity or atrophy Skin: warm and dry, no rash Neuro:  Alert and Oriented x 3, Strength and sensation are intact Psych: euthymic mood, full affect   ASSESSMENT:    1. Shortness of breath   2. Palpitations   3. Near syncope   4. Tobacco use   5. Hyperlipidemia, unspecified hyperlipidemia  type     PLAN:    DOE: Reports dyspnea with minimal exertion.  Could represent anginal equivalent, as does have significant CAD risk factors (hyperlipidemia, tobacco use, hypertension). -Not a good coronary CTA candidate given her baseline tachycardia.  Recommend Lexiscan Myoview to evaluate for ischemia.  Palpitations/ near syncope: Echo 06/24/2021 shows normal biventricular function, grade 1 diastolic dysfunction, no significant valvular disease.  Zio patch x7 days on 06/19/2021 showed no significant abnormalities, did have episodes of Wenckebach.  Tachycardia: Sinus tachycardia to 119 clinic at previous clinic visit.  Average heart rate 99 bpm on recent monitor.  Echocardiogram without structural heart disease as above.  Normal TSH on 05/30/2021  Abnormal EKG: Inferior Q waves.  Echocardiogram unremarkable as above.  Hypertension: Normotensive in clinic today, she is not currently on any antihypertensives  Hyperlipidemia: LDL 131 11/12/2020.  Check calcium score to guide how aggressive to be in lowering cholesterol.  Tobacco use: Smokes 0.25 PPD.  Counseled on the risk of tobacco use and cessation strongly encouraged  RTC in 6 months  Shared Decision Making/Informed Consent The risks [chest pain, shortness of breath, cardiac arrhythmias, dizziness, blood pressure fluctuations, myocardial infarction, stroke/transient ischemic attack, nausea, vomiting, allergic reaction, radiation exposure, metallic taste sensation and life-threatening complications (estimated to be 1 in 10,000)], benefits (risk stratification, diagnosing coronary artery disease, treatment guidance) and alternatives of a nuclear stress test were discussed in detail with Kelly Zuniga and she agrees to proceed.    Medication Adjustments/Labs and Tests Ordered: Current medicines are reviewed at length with the patient today.  Concerns regarding medicines are outlined above.  Orders Placed This Encounter  Procedures   CT  CARDIAC SCORING (SELF PAY ONLY)   MYOCARDIAL PERFUSION IMAGING   EKG 12-Lead     No orders of the defined types were placed in this encounter.   Patient Instructions  Medication Instructions:  Your physician recommends that you continue on your current medications as directed. Please refer to the Current Medication list given to you today.  *If you need a refill on your cardiac medications before your next appointment, please call your pharmacy*  Testing/Procedures: Your physician has requested that you have a  Scientist, physiological (at [facility]). For further information please visit [email or web address] Please follow instruction sheet, as given.   How to prepare for your Myocardial Perfusion Test: Do not eat or drink 3 hours prior to your test, except you may have water. Do not consume products containing caffeine (regular or decaffeinated) 12 hours prior to your test. (ex: coffee, chocolate, sodas, tea). Do bring a list of your current medications with you.  If not listed below, you may take your medications as normal. Do wear comfortable clothes (no dresses or overalls) and walking shoes, tennis shoes preferred (No heels or open toe shoes are allowed). Do NOT wear cologne, perfume, aftershave, or lotions (deodorant is allowed). The test will take approximately 3 to 4 hours to complete If these instructions are not followed, your test will have to be rescheduled.  CT coronary calcium score. This test is done at [address] [facility] [address]. This is $99 out of pocket.   Coronary CalciumScan A coronary calcium scan is an imaging test used to look for deposits of calcium and other fatty materials (plaques) in the inner lining of the blood vessels of the heart (coronary arteries). These deposits of calcium and plaques can partly clog and narrow the coronary arteries without producing any symptoms or warning signs. This puts a person at risk for a heart attack. This test can detect  these deposits before symptoms develop. Tell a health care provider about: Any allergies you have. All medicines you are taking, including vitamins, herbs, eye drops, creams, and over-the-counter medicines. Any problems you or family members have had with anesthetic medicines. Any blood disorders you have. Any surgeries you have had. Any medical conditions you have. Whether you are pregnant or may be pregnant. What are the risks? Generally, this is a safe procedure. However, problems may occur, including: Harm to a pregnant woman and her unborn baby. This test involves the use of radiation. Radiation exposure can be dangerous to a pregnant woman and her unborn baby. If you are pregnant, you generally should not have this procedure done. Slight increase in the risk of cancer. This is because of the radiation involved in the test. What happens before the procedure? No preparation is needed for this procedure. What happens during the procedure? You will undress and remove any jewelry around your neck or chest. You will put on a hospital gown. Sticky electrodes will be placed on your chest. The electrodes will be connected to an electrocardiogram (ECG) machine to record a tracing of the electrical activity of your heart. A CT scanner will take pictures of your heart. During this time, you will be asked to lie still and hold your breath for 2-3 seconds while a picture of your heart is being taken. The procedure may vary among health care providers and hospitals. What happens after the procedure? You can get dressed. You can return to your normal activities. It is up to you to get the results of your test. Ask your health care provider, or the department that is doing the test, when your results will be ready. Summary A coronary calcium scan is an imaging test used to look for deposits of calcium and other fatty materials (plaques) in the inner lining of the blood vessels of the heart (coronary  arteries). Generally, this is a safe procedure. Tell your health care provider if you are pregnant or may be pregnant. No preparation is needed for this procedure. A CT scanner will take pictures of your heart.  You can return to your normal activities after the scan is done. This information is not intended to replace advice given to you by your health care provider. Make sure you discuss any questions you have with your health care provider. Document Released: 06/04/2008 Document Revised: 10/26/2016 Document Reviewed: 10/26/2016 Elsevier Interactive Patient Education  2017 [employer]   Follow-Up: At [facility], you and your health needs are our priority.  As part of our continuing mission to provide you with exceptional heart care, we have created designated Provider Care Teams.  These Care Teams include your primary Cardiologist (physician) and Advanced Practice Providers (APPs -  Physician Assistants and Nurse Practitioners) who all work together to provide you with the care you need, when you need it.  We recommend signing up for the patient portal called "MyChart".  Sign up information is provided on this After Visit Summary.  MyChart is used to connect with patients for Virtual Visits (Telemedicine).  Patients are able to view lab/test results, encounter notes, upcoming appointments, etc.  Non-urgent messages can be sent to your provider as well.   To learn more about what you can do with MyChart, go to [email or web address].    Your next appointment:   6 month(s)  The format for your next appointment:   In Person  Provider:   Epifanio Lescheshristopher Starlena Beil, MD   [place] L Morell Mears, MD

## 2021-08-06 ENCOUNTER — Encounter: Payer: Self-pay | Admitting: Cardiology

## 2021-08-06 ENCOUNTER — Other Ambulatory Visit: Payer: Self-pay

## 2021-08-06 ENCOUNTER — Ambulatory Visit (INDEPENDENT_AMBULATORY_CARE_PROVIDER_SITE_OTHER): Payer: No Typology Code available for payment source | Admitting: Cardiology

## 2021-08-06 VITALS — BP 100/56 | HR 120 | Ht 60.0 in | Wt 80.2 lb

## 2021-08-06 DIAGNOSIS — Z720 Tobacco use: Secondary | ICD-10-CM

## 2021-08-06 DIAGNOSIS — R55 Syncope and collapse: Secondary | ICD-10-CM | POA: Diagnosis not present

## 2021-08-06 DIAGNOSIS — R002 Palpitations: Secondary | ICD-10-CM | POA: Diagnosis not present

## 2021-08-06 DIAGNOSIS — E785 Hyperlipidemia, unspecified: Secondary | ICD-10-CM

## 2021-08-06 DIAGNOSIS — R0602 Shortness of breath: Secondary | ICD-10-CM

## 2021-08-06 NOTE — Patient Instructions (Signed)
Medication Instructions:  Your physician recommends that you continue on your current medications as directed. Please refer to the Current Medication list given to you today.  *If you need a refill on your cardiac medications before your next appointment, please call your pharmacy*  Testing/Procedures: Your physician has requested that you have a Scientist, physiological (at [facility]). For further information please visit [email or web address] Please follow instruction sheet, as given.   How to prepare for your Myocardial Perfusion Test: Do not eat or drink 3 hours prior to your test, except you may have water. Do not consume products containing caffeine (regular or decaffeinated) 12 hours prior to your test. (ex: coffee, chocolate, sodas, tea). Do bring a list of your current medications with you.  If not listed below, you may take your medications as normal. Do wear comfortable clothes (no dresses or overalls) and walking shoes, tennis shoes preferred (No heels or open toe shoes are allowed). Do NOT wear cologne, perfume, aftershave, or lotions (deodorant is allowed). The test will take approximately 3 to 4 hours to complete If these instructions are not followed, your test will have to be rescheduled.  CT coronary calcium score. This test is done at [address] [facility] [address]. This is $99 out of pocket.   Coronary CalciumScan A coronary calcium scan is an imaging test used to look for deposits of calcium and other fatty materials (plaques) in the inner lining of the blood vessels of the heart (coronary arteries). These deposits of calcium and plaques can partly clog and narrow the coronary arteries without producing any symptoms or warning signs. This puts a person at risk for a heart attack. This test can detect these deposits before symptoms develop. Tell a health care provider about: Any allergies you have. All medicines you are taking, including vitamins, herbs, eye drops, creams, and  over-the-counter medicines. Any problems you or family members have had with anesthetic medicines. Any blood disorders you have. Any surgeries you have had. Any medical conditions you have. Whether you are pregnant or may be pregnant. What are the risks? Generally, this is a safe procedure. However, problems may occur, including: Harm to a pregnant woman and her unborn baby. This test involves the use of radiation. Radiation exposure can be dangerous to a pregnant woman and her unborn baby. If you are pregnant, you generally should not have this procedure done. Slight increase in the risk of cancer. This is because of the radiation involved in the test. What happens before the procedure? No preparation is needed for this procedure. What happens during the procedure? You will undress and remove any jewelry around your neck or chest. You will put on a hospital gown. Sticky electrodes will be placed on your chest. The electrodes will be connected to an electrocardiogram (ECG) machine to record a tracing of the electrical activity of your heart. A CT scanner will take pictures of your heart. During this time, you will be asked to lie still and hold your breath for 2-3 seconds while a picture of your heart is being taken. The procedure may vary among health care providers and hospitals. What happens after the procedure? You can get dressed. You can return to your normal activities. It is up to you to get the results of your test. Ask your health care provider, or the department that is doing the test, when your results will be ready. Summary A coronary calcium scan is an imaging test used to look for deposits of calcium  and other fatty materials (plaques) in the inner lining of the blood vessels of the heart (coronary arteries). Generally, this is a safe procedure. Tell your health care provider if you are pregnant or may be pregnant. No preparation is needed for this procedure. A CT scanner  will take pictures of your heart. You can return to your normal activities after the scan is done. This information is not intended to replace advice given to you by your health care provider. Make sure you discuss any questions you have with your health care provider. Document Released: 06/04/2008 Document Revised: 10/26/2016 Document Reviewed: 10/26/2016 Elsevier Interactive Patient Education  2017 [employer].   Follow-Up: At [facility], you and your health needs are our priority.  As part of our continuing mission to provide you with exceptional heart care, we have created designated Provider Care Teams.  These Care Teams include your primary Cardiologist (physician) and Advanced Practice Providers (APPs -  Physician Assistants and Nurse Practitioners) who all work together to provide you with the care you need, when you need it.  We recommend signing up for the patient portal called "MyChart".  Sign up information is provided on this After Visit Summary.  MyChart is used to connect with patients for Virtual Visits (Telemedicine).  Patients are able to view lab/test results, encounter notes, upcoming appointments, etc.  Non-urgent messages can be sent to your provider as well.   To learn more about what you can do with MyChart, go to [email or web address].    Your next appointment:   6 month(s)  The format for your next appointment:   In Person  Provider:   Epifanio Lesches, MD

## 2021-08-11 ENCOUNTER — Telehealth (HOSPITAL_COMMUNITY): Payer: Self-pay

## 2021-08-11 NOTE — Telephone Encounter (Signed)
Detailed instructions left on the patient's answering machine. Asked to call back with any questions. S.Braylyn Eye EMTP 

## 2021-08-14 ENCOUNTER — Ambulatory Visit (INDEPENDENT_AMBULATORY_CARE_PROVIDER_SITE_OTHER)
Admission: RE | Admit: 2021-08-14 | Discharge: 2021-08-14 | Disposition: A | Discharge status: Home / Self Care | Payer: Self-pay | Source: Ambulatory Visit | Attending: Cardiology | Admitting: Cardiology

## 2021-08-14 ENCOUNTER — Ambulatory Visit (HOSPITAL_COMMUNITY): Payer: No Typology Code available for payment source | Attending: Cardiovascular Disease

## 2021-08-14 ENCOUNTER — Other Ambulatory Visit: Payer: Self-pay

## 2021-08-14 DIAGNOSIS — R0602 Shortness of breath: Secondary | ICD-10-CM | POA: Diagnosis present

## 2021-08-14 DIAGNOSIS — I10 Essential (primary) hypertension: Secondary | ICD-10-CM

## 2021-08-14 LAB — MYOCARDIAL PERFUSION IMAGING
Base ST Depression (mm): 0 mm
LV dias vol: 44 mL (ref 46–106)
LV sys vol: 13 mL
Nuc Stress EF: 71 %
Peak HR: 127 {beats}/min
Rest HR: 120 {beats}/min
Rest Nuclear Isotope Dose: 9.2 mCi
SDS: 2
SRS: 0
SSS: 2
ST Depression (mm): 0 mm
Stress Nuclear Isotope Dose: 31.6 mCi
TID: 1.15

## 2021-08-14 MED ORDER — ADENOSINE (DIAGNOSTIC) 3 MG/ML IV SOLN
0.5600 mg/kg | Freq: Once | INTRAVENOUS | Status: AC
  Administered 2021-08-14: 20.4 mg via INTRAVENOUS

## 2021-08-14 MED ORDER — TECHNETIUM TC 99M TETROFOSMIN IV KIT
31.6000 | PACK | Freq: Once | INTRAVENOUS | Status: AC | PRN
  Administered 2021-08-14: 31.6 via INTRAVENOUS
  Filled 2021-08-14: qty 32

## 2021-08-14 MED ORDER — TECHNETIUM TC 99M TETROFOSMIN IV KIT
9.2000 | PACK | Freq: Once | INTRAVENOUS | Status: AC | PRN
  Administered 2021-08-14: 9.2 via INTRAVENOUS
  Filled 2021-08-14: qty 10

## 2021-08-18 ENCOUNTER — Telehealth: Payer: Self-pay

## 2021-08-18 ENCOUNTER — Other Ambulatory Visit: Payer: Self-pay

## 2021-08-18 NOTE — Telephone Encounter (Signed)
Pt needs refill on albuterol (VENTOLIN HFA) 108 (90 Base) MCG/ACT inhaler [[id number]]    [id number] - , [place] - [address] AT [facility] OF [address]    Pt call back [phone]   Past appt 04/22 No Future appt    Last filled by a historical provider, okay to send?

## 2021-08-18 NOTE — Telephone Encounter (Signed)
Medication request sent for approval since medication was filled by a historical provider.

## 2021-08-18 NOTE — Telephone Encounter (Signed)
Pt needs refill on albuterol (VENTOLIN HFA) 108 (90 Base) MCG/ACT inhaler [[id number]]   [facility] #[id number] - [place], [place] - [address] AT [facility] OF [address]   Pt call back [phone]  Past appt 04/22 No Future appt

## 2021-08-19 MED ORDER — ALBUTEROL SULFATE HFA 108 (90 BASE) MCG/ACT IN AERS: 2.0000 | INHALATION_SPRAY | RESPIRATORY_TRACT | 0 refills | Status: DC

## 2021-08-19 NOTE — Telephone Encounter (Signed)
Temporary refill granted, but needs OV with PCP to discuss asthma.

## 2021-08-20 ENCOUNTER — Encounter: Payer: Self-pay | Admitting: Registered Nurse

## 2021-08-20 ENCOUNTER — Ambulatory Visit (INDEPENDENT_AMBULATORY_CARE_PROVIDER_SITE_OTHER): Payer: No Typology Code available for payment source | Admitting: Registered Nurse

## 2021-08-20 ENCOUNTER — Other Ambulatory Visit: Payer: Self-pay

## 2021-08-20 VITALS — BP 120/79 | HR 112 | Temp 98.1°F | Resp 17 | Ht 60.0 in | Wt 85.0 lb

## 2021-08-20 DIAGNOSIS — R569 Unspecified convulsions: Secondary | ICD-10-CM | POA: Diagnosis not present

## 2021-08-20 DIAGNOSIS — W19XXXA Unspecified fall, initial encounter: Secondary | ICD-10-CM

## 2021-08-20 DIAGNOSIS — R7989 Other specified abnormal findings of blood chemistry: Secondary | ICD-10-CM

## 2021-08-20 DIAGNOSIS — F10239 Alcohol dependence with withdrawal, unspecified: Secondary | ICD-10-CM | POA: Diagnosis not present

## 2021-08-20 DIAGNOSIS — F411 Generalized anxiety disorder: Secondary | ICD-10-CM | POA: Diagnosis not present

## 2021-08-20 NOTE — Progress Notes (Signed)
Established Patient Office Visit  Subjective:  Patient ID: Kelly Zuniga, female    DOB: 12-Jun-1966  Age: 55 y.o. MRN: [id number]  CC:  Chief Complaint  Patient presents with   Asthma    Follow up    HPI Kelly Zuniga presents for follow up   Asthma -  Ongoing DOE, working with cardiology Recent CT cardiac scoring placed her in 99th percentile.  Will let cardiology determine need for statin therapy. She is in complex position of hepatic steatosis and history of alcohol abuse as well as seizures.   She does continue to follow up with neurology and cardiology.  Overall feeling stable. No acute concerns at this time  We reviewed recent imaging, labs, diagnostics, and specialist visits.   Past Medical History:  Diagnosis Date   Alcohol abuse    Allergy    Anemia    low iron   Anxiety    Asthma    Asthma    [facility] 11/09/2020   Depression    posibly bipolar disorder. 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   Hypertension    never on meds    Pneumonia    at 13 years   Seizure ([place]) 02/09/2021   Seizure ([place])    Substance abuse ([place])    [place] 11/09/2020    Past Surgical History:  Procedure Laterality Date   benign cyst     removed from left cheek   RADIOLOGY WITH ANESTHESIA N/A 04/15/2021   Procedure: MRI/CT OF ABDOMEN WITH ANESTHESIA;  Surgeon: Radiologist, Medication, MD;  Location: [facility];  Service: Radiology;  Laterality: N/A;   TONSILECTOMY, ADENOIDECTOMY, BILATERAL MYRINGOTOMY AND TUBES      Family History  Problem Relation Age of Onset   Coronary artery disease Other        1st degree relative female <50   Depression Other        Family Hx   Diabetes Other        Family Hx 1st degree relative   Ovarian cancer Other        Family Hx    Healthy Mother    Diabetes Father    Gout Father     Hypertension Father    Heart failure Father    Cancer Father    Heart attack Father     Social History   Socioeconomic History   Marital status: Significant Other    Spouse name: Not on file   Number of children: 0   Years of education: college   Highest education level: Professional school degree (e.g., MD, DDS, DVM, JD)  Occupational History   Occupation: Nutritional therapist  Tobacco Use   Smoking status: Every Day    Packs/day: 0.25    Types: Cigarettes   Smokeless tobacco: Never  Vaping Use   Vaping Use: Former  Substance and Sexual Activity   Alcohol use: Yes    Comment: 1 glass of wine per day or less   Drug use: No   Sexual activity: Not on file  Other Topics Concern   Not on file  Social History Narrative   Lives with significant other and his two children.   Right-handed.   No daily caffeine.   Social Determinants of Health   Financial Resource Strain: Not on file  Food Insecurity: Not on file  Transportation Needs:  Not on file  Physical Activity: Not on file  Stress: Not on file  Social Connections: Not on file  Intimate Partner Violence: Not on file    Outpatient Medications Prior to Visit  Medication Sig Dispense Refill   albuterol (PROVENTIL) (2.5 MG/3ML) 0.083% nebulizer solution Take 2.5 mg by nebulization every 6 (six) hours as needed for wheezing or shortness of breath.     albuterol (VENTOLIN HFA) 108 (90 Base) MCG/ACT inhaler Inhale 2 puffs into the lungs See admin instructions. Inhale 2 puffs into the lungs every 4-6 hours as needed for shortness of breath or wheezing 18 g 0   B Complex-C (SUPER B COMPLEX/VITAMIN C PO) Take 1 tablet by mouth daily. With Folic Acid     BIOTIN PO Take 1 tablet by mouth in the morning.     cetirizine (ZYRTEC) 10 MG tablet Take 10 mg by mouth daily.     clonazePAM (KLONOPIN) 0.5 MG tablet Take 1 tablet (0.5 mg total) by mouth 3 (three) times daily as needed for anxiety. 90 tablet 2   escitalopram (LEXAPRO) 10 MG tablet  Take 10 mg by mouth 2 (two) times daily.     LOTEMAX SM 0.38 % GEL Apply 1 drop to eye 4 (four) times daily.     magnesium gluconate (MAGONATE) 500 MG tablet Take 500 mg by mouth daily.     Multiple Vitamins-Minerals (MULTI ADULT GUMMIES) CHEW Chew 2 tablets by mouth in the morning.     naproxen sodium (ALEVE) 220 MG tablet Take 220 mg by mouth 2 (two) times daily as needed (for mild pain).     thiamine 100 MG tablet Take 1 tablet (100 mg total) by mouth daily. 30 tablet 0   topiramate (TOPAMAX) 100 MG tablet Take 1 tablet (100 mg total) by mouth 2 (two) times daily. 60 tablet 6   triamcinolone (NASACORT) 55 MCG/ACT nasal inhaler Place 2 sprays into the nose daily. For allergies 1 Inhaler 11   No facility-administered medications prior to visit.    Allergies  Allergen Reactions   Acetaminophen Other (See Comments)    Makes LFT get elevated and causes liver problems/issues    Lithium Other (See Comments)    Causes the patient to have no appetite   Promethazine-Codeine Nausea And Vomiting and Other (See Comments)    Any codeine-based cough syrups cause N/V   Codeine Nausea And Vomiting   Morphine And Related Nausea And Vomiting    ROS Review of Systems  Constitutional: Negative.   HENT: Negative.    Eyes: Negative.   Respiratory: Negative.    Cardiovascular: Negative.   Gastrointestinal: Negative.   Genitourinary: Negative.   Musculoskeletal: Negative.   Skin: Negative.   Neurological: Negative.   Psychiatric/Behavioral: Negative.    All other systems reviewed and are negative.    Objective:    Physical Exam Vitals and nursing note reviewed.  Constitutional:      General: She is not in acute distress.    Appearance: Normal appearance. She is normal weight. She is not ill-appearing, toxic-appearing or diaphoretic.  Cardiovascular:     Rate and Rhythm: Normal rate and regular rhythm.     Heart sounds: Normal heart sounds. No murmur heard.   No friction rub. No gallop.   Pulmonary:     Effort: Pulmonary effort is normal. No respiratory distress.     Breath sounds: Normal breath sounds. No stridor. No wheezing, rhonchi or rales.  Chest:     Chest wall: No tenderness.  Skin:    General: Skin is warm and dry.  Neurological:     General: No focal deficit present.     Mental Status: She is alert and oriented to person, place, and time. Mental status is at baseline.  Psychiatric:        Mood and Affect: Mood normal.        Behavior: Behavior normal.        Thought Content: Thought content normal.        Judgment: Judgment normal.    BP 120/79   Pulse (!) 112   Temp 98.1 F (36.7 C) (Temporal)   Resp 17   Ht 5' (1.524 m)   Wt 85 lb (38.6 kg)   LMP 11/04/2013   SpO2 95%   BMI 16.60 kg/m  Wt Readings from Last 3 Encounters:  08/20/21 85 lb (38.6 kg)  08/14/21 80 lb (36.3 kg)  08/06/21 80 lb 3.2 oz (36.4 kg)     Health Maintenance Due  Topic Date Due   PAP SMEAR-Modifier  02/11/2014   MAMMOGRAM  11/07/2017   COVID-19 Vaccine (3 - Booster for Pfizer series) 08/31/2020   Zoster Vaccines- Shingrix (2 of 2) 01/07/2021   INFLUENZA VACCINE  07/21/2021    There are no preventive care reminders to display for this patient.  Lab Results  Component Value Date   TSH 2.550 05/30/2021   Lab Results  Component Value Date   WBC 5.9 05/30/2021   HGB 12.0 05/30/2021   HCT 33.9 (L) 05/30/2021   MCV 98 (H) 05/30/2021   PLT 223 05/30/2021   Lab Results  Component Value Date   NA 143 05/30/2021   K 3.8 05/30/2021   CO2 22 05/30/2021   GLUCOSE 95 05/30/2021   BUN 10 05/30/2021   CREATININE 0.72 05/30/2021   BILITOT 0.5 07/14/2021   ALKPHOS 116 07/14/2021   AST 166 (H) 07/14/2021   ALT 81 (H) 07/14/2021   PROT 6.8 07/14/2021   ALBUMIN 4.7 07/14/2021   CALCIUM 10.0 05/30/2021   ANIONGAP 17 (H) 04/15/2021   EGFR 100 05/30/2021   GFR 155.91 02/28/2013   Lab Results  Component Value Date   CHOL 232 (H) 11/12/2020   Lab Results   Component Value Date   HDL 88 11/12/2020   Lab Results  Component Value Date   LDLCALC 131 (H) 11/12/2020   Lab Results  Component Value Date   TRIG 77 11/12/2020   Lab Results  Component Value Date   CHOLHDL 2.6 11/12/2020   Lab Results  Component Value Date   HGBA1C 4.9 11/12/2020      Assessment & Plan:   Problem List Items Addressed This Visit       Other   Alcohol dependence ([place])   Elevated LFTs   Generalized anxiety disorder   Fall due to seizure ([place])   Seizure-like activity ([place]) - Primary    No orders of the defined types were placed in this encounter.   Follow-up: No follow-ups on file.   PLAN Spent 36 minutes reviewing chart, discussing work up with patient, reviewing pathophysiology of disease process, and planning follow up with patient. Can refill meds as needed. Patient encouraged to call clinic with any questions, comments, or concerns.  Maximiano Coss, NP

## 2021-09-16 ENCOUNTER — Encounter: Payer: Self-pay | Admitting: *Deleted

## 2021-10-13 NOTE — Telephone Encounter (Signed)
This concern has been previously addressed by myself and/or another provider.  If they patient has ongoing concerns, they can contact me at their convenience.  Thank you,  Rich Lemmie Steinhaus, NP 

## 2021-10-21 ENCOUNTER — Ambulatory Visit: Payer: No Typology Code available for payment source | Admitting: Neurology

## 2021-10-21 ENCOUNTER — Telehealth: Payer: Self-pay | Admitting: Neurology

## 2021-10-21 NOTE — Telephone Encounter (Signed)
Pt called, woke up with a fever and not feeling well and does not feel comfortable. Pt rescheduled her appt for today.

## 2021-10-23 ENCOUNTER — Other Ambulatory Visit: Payer: Self-pay | Admitting: Adult Health

## 2021-10-23 DIAGNOSIS — F411 Generalized anxiety disorder: Secondary | ICD-10-CM

## 2021-10-24 ENCOUNTER — Other Ambulatory Visit: Payer: Self-pay | Admitting: Adult Health

## 2021-10-24 NOTE — Telephone Encounter (Signed)
Last filled 11/11/20 last seen 12/09/20

## 2021-10-24 NOTE — Telephone Encounter (Signed)
Please schedule appt

## 2021-11-18 ENCOUNTER — Ambulatory Visit: Payer: No Typology Code available for payment source | Admitting: Registered Nurse

## 2021-11-22 ENCOUNTER — Emergency Department (HOSPITAL_COMMUNITY): Payer: No Typology Code available for payment source

## 2021-11-22 ENCOUNTER — Other Ambulatory Visit: Payer: Self-pay

## 2021-11-22 ENCOUNTER — Emergency Department (HOSPITAL_COMMUNITY)
Admission: EM | Admit: 2021-11-22 | Discharge: 2021-11-22 | Disposition: A | Discharge status: Home / Self Care | Payer: No Typology Code available for payment source | Attending: Emergency Medicine | Admitting: Emergency Medicine

## 2021-11-22 DIAGNOSIS — R042 Hemoptysis: Secondary | ICD-10-CM

## 2021-11-22 DIAGNOSIS — I10 Essential (primary) hypertension: Secondary | ICD-10-CM | POA: Diagnosis not present

## 2021-11-22 DIAGNOSIS — R195 Other fecal abnormalities: Secondary | ICD-10-CM

## 2021-11-22 DIAGNOSIS — Z79899 Other long term (current) drug therapy: Secondary | ICD-10-CM | POA: Insufficient documentation

## 2021-11-22 DIAGNOSIS — F17210 Nicotine dependence, cigarettes, uncomplicated: Secondary | ICD-10-CM | POA: Insufficient documentation

## 2021-11-22 DIAGNOSIS — Z7951 Long term (current) use of inhaled steroids: Secondary | ICD-10-CM | POA: Diagnosis not present

## 2021-11-22 DIAGNOSIS — J45909 Unspecified asthma, uncomplicated: Secondary | ICD-10-CM | POA: Diagnosis not present

## 2021-11-22 DIAGNOSIS — R531 Weakness: Secondary | ICD-10-CM | POA: Insufficient documentation

## 2021-11-22 LAB — COMPREHENSIVE METABOLIC PANEL
ALT: 43 U/L (ref 0–44)
AST: 189 U/L — ABNORMAL HIGH (ref 15–41)
Albumin: 4.7 g/dL (ref 3.5–5.0)
Alkaline Phosphatase: 147 U/L — ABNORMAL HIGH (ref 38–126)
Anion gap: 16 — ABNORMAL HIGH (ref 5–15)
BUN: 14 mg/dL (ref 6–20)
CO2: 23 mmol/L (ref 22–32)
Calcium: 8.7 mg/dL — ABNORMAL LOW (ref 8.9–10.3)
Chloride: 100 mmol/L (ref 98–111)
Creatinine, Ser: <0.3 mg/dL — ABNORMAL LOW (ref 0.44–1.00)
Glucose, Bld: 76 mg/dL (ref 70–99)
Potassium: 3.2 mmol/L — ABNORMAL LOW (ref 3.5–5.1)
Sodium: 139 mmol/L (ref 135–145)
Total Bilirubin: 1.1 mg/dL (ref 0.3–1.2)
Total Protein: 6.8 g/dL (ref 6.5–8.1)

## 2021-11-22 LAB — CBC WITH DIFFERENTIAL/PLATELET
Abs Immature Granulocytes: 0.02 10*3/uL (ref 0.00–0.07)
Basophils Absolute: 0.1 10*3/uL (ref 0.0–0.1)
Basophils Relative: 2 %
Eosinophils Absolute: 0.1 10*3/uL (ref 0.0–0.5)
Eosinophils Relative: 2 %
HCT: 32 % — ABNORMAL LOW (ref 36.0–46.0)
Hemoglobin: 10.5 g/dL — ABNORMAL LOW (ref 12.0–15.0)
Immature Granulocytes: 1 %
Lymphocytes Relative: 29 %
Lymphs Abs: 1.2 10*3/uL (ref 0.7–4.0)
MCH: 35.8 pg — ABNORMAL HIGH (ref 26.0–34.0)
MCHC: 32.8 g/dL (ref 30.0–36.0)
MCV: 109.2 fL — ABNORMAL HIGH (ref 80.0–100.0)
Monocytes Absolute: 0.5 10*3/uL (ref 0.1–1.0)
Monocytes Relative: 12 %
Neutro Abs: 2.2 10*3/uL (ref 1.7–7.7)
Neutrophils Relative %: 54 %
Platelets: 110 10*3/uL — ABNORMAL LOW (ref 150–400)
RBC: 2.93 MIL/uL — ABNORMAL LOW (ref 3.87–5.11)
RDW: 16 % — ABNORMAL HIGH (ref 11.5–15.5)
WBC: 4 10*3/uL (ref 4.0–10.5)
nRBC: 0 % (ref 0.0–0.2)

## 2021-11-22 LAB — MAGNESIUM: Magnesium: 1.5 mg/dL — ABNORMAL LOW (ref 1.7–2.4)

## 2021-11-22 LAB — PROTIME-INR
INR: 1 (ref 0.8–1.2)
Prothrombin Time: 12.9 seconds (ref 11.4–15.2)

## 2021-11-22 LAB — TYPE AND SCREEN
ABO/RH(D): A POS
Antibody Screen: NEGATIVE

## 2021-11-22 LAB — POC OCCULT BLOOD, ED: Fecal Occult Bld: POSITIVE — AB

## 2021-11-22 LAB — APTT: aPTT: 32 seconds (ref 24–36)

## 2021-11-22 MED ORDER — OMEPRAZOLE 20 MG PO CPDR: 20.0000 mg | DELAYED_RELEASE_CAPSULE | Freq: Two times a day (BID) | ORAL | 0 refills | Status: DC

## 2021-11-22 MED ORDER — IOHEXOL 350 MG/ML SOLN
75.0000 mL | Freq: Once | INTRAVENOUS | Status: AC | PRN
  Administered 2021-11-22: 75 mL via INTRAVENOUS
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
## 2021-11-22 NOTE — Discharge Instructions (Signed)
You are seen in the ER for bloody phlegm and possible bloody emesis.  Work-up in the ER is reassuring.  As discussed, return to the ER if you start having worsening bloody phlegm, any amount of bloody vomiting, dark tarry stools, severe shortness of breath, fevers or confusion.  Otherwise, follow-up with your primary care doctor.  We have provided the phone number for the GI doctor on-call. Call them OR have your PCP give you referral.

## 2021-11-22 NOTE — ED Provider Notes (Signed)
Emergency Medicine Provider Triage Evaluation Note  Kelly Zuniga , a 55 y.o. female  was evaluated in triage.  Pt complains of coughing up "straight blood" for the past week, but worsening after her seizure yesterday. The patient reports she is compliant with her medications. She had a seizure yesterday where she hit her head. She had fecal and urine incontinence during this. Denies an worsening vision. Denies any neck pain. The patient reports she has chronic chest pain and SOB that has not worsened and is at her baseline. .  Review of Systems  Positive: Head pain, seizure, hemoptysis Negative: Blurry vision, neck pain, abdominal pain, melena  Physical Exam  BP (!) 143/74 (BP Location: Left Arm)   Pulse 80   Temp 97.8 F (36.6 C) (Oral)   LMP 11/04/2013   SpO2 98%  Gen:   Awake, no distress   Resp:  Normal effort  MSK:   Moves extremities without difficulty, no weakness, no pronator drift Other:  Egg sized hematoma to the posterior scalp, no stepoffs or deformities palpated, no pronator drift, no midline tenderness, paraspinal cervical tenderness  Medical Decision Making  Medically screening exam initiated at 11:24 AM.  Appropriate orders placed.  Davida Falconi was informed that the remainder of the evaluation will be completed by another provider, this initial triage assessment does not replace that evaluation, and the importance of remaining in the ED until their evaluation is complete.  VSS, NAD. Labs and imaging ordered.   Achille Rich, PA-C 11/22/21 1129    Vanetta Mulders, MD 11/27/21 [phone]

## 2021-11-22 NOTE — ED Triage Notes (Signed)
Patient bib [facility], patient reported vomiting blood, weakness, history of seizures. Patient had seizure yesterday and fell. Patient reports pain in left side 9/10.

## 2021-11-24 ENCOUNTER — Encounter: Payer: Self-pay | Admitting: Registered Nurse

## 2021-11-24 ENCOUNTER — Other Ambulatory Visit: Payer: Self-pay | Admitting: Registered Nurse

## 2021-11-24 DIAGNOSIS — R195 Other fecal abnormalities: Secondary | ICD-10-CM

## 2021-11-24 DIAGNOSIS — R042 Hemoptysis: Secondary | ICD-10-CM

## 2021-11-24 NOTE — Telephone Encounter (Signed)
Pt scheduled for Wed for an ER f/up and to see GI on 12/04/21

## 2021-11-24 NOTE — Telephone Encounter (Signed)
Thank you :)

## 2021-11-26 ENCOUNTER — Ambulatory Visit (INDEPENDENT_AMBULATORY_CARE_PROVIDER_SITE_OTHER): Payer: No Typology Code available for payment source | Admitting: Registered Nurse

## 2021-11-26 VITALS — BP 126/70 | HR 106 | Temp 98.2°F | Resp 16 | Ht 60.0 in | Wt 86.4 lb

## 2021-11-26 DIAGNOSIS — K921 Melena: Secondary | ICD-10-CM

## 2021-11-26 DIAGNOSIS — R159 Full incontinence of feces: Secondary | ICD-10-CM | POA: Diagnosis not present

## 2021-11-26 DIAGNOSIS — K920 Hematemesis: Secondary | ICD-10-CM

## 2021-11-26 DIAGNOSIS — Z09 Encounter for follow-up examination after completed treatment for conditions other than malignant neoplasm: Secondary | ICD-10-CM

## 2021-11-26 NOTE — Patient Instructions (Signed)
Ms. Robey -   Randie Heinz to see you  Let's stop the bleeding  I'll get GI connected in no time  I recommend labs - I understand you've declined and we have discussed the risks of this.  Thank you  Rich

## 2021-11-26 NOTE — Progress Notes (Signed)
Established Patient Office Visit  Subjective:  Patient ID: Kelly Zuniga, female    DOB: 12-25-1965  Age: 55 y.o. MRN: [id number]  CC:  Chief Complaint  Patient presents with   Follow-up    Pt reports seizure Friday, vomiting blood on Saturday, diarrhea with incontinence     HPI Kelly Zuniga presents for HFU  Seizure on Friday - pt describes as severe. Able to get herself onto couch from floor.   Made it through post-ictal state.  Following morning - had episode of vomiting with copious bright red blood, fecal incontinence.  Called 911, went to hospital via EMS.  CT head without acute findings CT angio chest PE  without acute findings CT c spine without acute finding Labs show a drop in hgb to 10.5. lower than any findings in previous year.  Notes that since returning home she has been resting. Still drinking 0-2 glasses of wine daily. Bland foods mostly but diet limited by fatigue and poor appetite.   She has been feeling worse overall with more fatigue and being generally worn down. Continues to have blood in cough and frequent BM without melena or brbpr  Has not seen GI in some time.   Past Medical History:  Diagnosis Date   Alcohol abuse    Allergy    Anemia    low iron   Anxiety    Asthma    Asthma    [facility] 11/09/2020   Depression    posibly bipolar disorder. 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   Hypertension    never on meds    Pneumonia    at 13 years   Seizure ([place]) 02/09/2021   Seizure ([place])    Substance abuse ([place])    [place] 11/09/2020    Past Surgical History:  Procedure Laterality Date   benign cyst     removed from left cheek   RADIOLOGY WITH ANESTHESIA N/A 04/15/2021   Procedure: MRI/CT OF ABDOMEN WITH ANESTHESIA;  Surgeon: Radiologist, Medication, MD;  Location: [facility];  Service:  Radiology;  Laterality: N/A;   TONSILECTOMY, ADENOIDECTOMY, BILATERAL MYRINGOTOMY AND TUBES      Family History  Problem Relation Age of Onset   Coronary artery disease Other        1st degree relative female <50   Depression Other        Family Hx   Diabetes Other        Family Hx 1st degree relative   Ovarian cancer Other        Family Hx    Healthy Mother    Diabetes Father    Gout Father    Hypertension Father    Heart failure Father    Cancer Father    Heart attack Father     Social History   Socioeconomic History   Marital status: Significant Other    Spouse name: Not on file   Number of children: 0   Years of education: college   Highest education level: Professional school degree (e.g., MD, DDS, DVM, JD)  Occupational History   Occupation: Nutritional therapist  Tobacco Use   Smoking status: Every Day    Packs/day: 0.25    Types: Cigarettes   Smokeless tobacco: Never  Vaping Use   Vaping Use: Former  Substance and Sexual Activity   Alcohol use:  Yes    Comment: 1 glass of wine per day or less   Drug use: No   Sexual activity: Not on file  Other Topics Concern   Not on file  Social History Narrative   Lives with significant other and his two children.   Right-handed.   No daily caffeine.   Social Determinants of Health   Financial Resource Strain: Not on file  Food Insecurity: Not on file  Transportation Needs: Not on file  Physical Activity: Not on file  Stress: Not on file  Social Connections: Not on file  Intimate Partner Violence: Not on file    Outpatient Medications Prior to Visit  Medication Sig Dispense Refill   albuterol (PROVENTIL) (2.5 MG/3ML) 0.083% nebulizer solution Take 2.5 mg by nebulization every 6 (six) hours as needed for wheezing or shortness of breath.     albuterol (VENTOLIN HFA) 108 (90 Base) MCG/ACT inhaler Inhale 2 puffs into the lungs See admin instructions. Inhale 2 puffs into the lungs every 4-6 hours as needed for shortness of  breath or wheezing 18 g 0   B Complex-C (SUPER B COMPLEX/VITAMIN C PO) Take 1 tablet by mouth daily. With Folic Acid     BIOTIN PO Take 1 tablet by mouth in the morning.     cetirizine (ZYRTEC) 10 MG tablet Take 10 mg by mouth daily.     clonazePAM (KLONOPIN) 0.5 MG tablet Take 1 tablet (0.5 mg total) by mouth 3 (three) times daily as needed for anxiety. 90 tablet 2   escitalopram (LEXAPRO) 10 MG tablet Take 10 mg by mouth 2 (two) times daily.     LOTEMAX SM 0.38 % GEL Apply 1 drop to eye 4 (four) times daily.     magnesium gluconate (MAGONATE) 500 MG tablet Take 500 mg by mouth daily.     Multiple Vitamins-Minerals (MULTI ADULT GUMMIES) CHEW Chew 2 tablets by mouth in the morning.     naproxen sodium (ALEVE) 220 MG tablet Take 220 mg by mouth 2 (two) times daily as needed (for mild pain).     omeprazole (PRILOSEC) 20 MG capsule Take 1 capsule (20 mg total) by mouth 2 (two) times daily before a meal. 60 capsule 0   thiamine 100 MG tablet Take 1 tablet (100 mg total) by mouth daily. 30 tablet 0   topiramate (TOPAMAX) 100 MG tablet Take 1 tablet (100 mg total) by mouth 2 (two) times daily. 60 tablet 6   triamcinolone (NASACORT) 55 MCG/ACT nasal inhaler Place 2 sprays into the nose daily. For allergies 1 Inhaler 11   No facility-administered medications prior to visit.    Allergies  Allergen Reactions   Acetaminophen Other (See Comments)    Makes LFT get elevated and causes liver problems/issues    Lithium Other (See Comments)    Causes the patient to have no appetite   Promethazine-Codeine Nausea And Vomiting and Other (See Comments)    Any codeine-based cough syrups cause N/V   Codeine Nausea And Vomiting   Morphine And Related Nausea And Vomiting    ROS Review of Systems  Constitutional: Negative.   HENT: Negative.    Eyes: Negative.   Respiratory: Negative.    Cardiovascular: Negative.   Gastrointestinal: Negative.   Genitourinary: Negative.   Musculoskeletal: Negative.    Skin: Negative.   Neurological: Negative.   Psychiatric/Behavioral: Negative.    All other systems reviewed and are negative.    Objective:    Physical Exam Vitals and nursing note reviewed.  Constitutional:      General: She is not in acute distress.    Appearance: Normal appearance. She is ill-appearing. She is not toxic-appearing or diaphoretic.     Comments: underweight  Cardiovascular:     Rate and Rhythm: Normal rate and regular rhythm.     Heart sounds: Normal heart sounds. No murmur heard.   No friction rub. No gallop.  Pulmonary:     Effort: Pulmonary effort is normal. No respiratory distress.     Breath sounds: Normal breath sounds. No stridor. No wheezing, rhonchi or rales.  Chest:     Chest wall: No tenderness.  Skin:    General: Skin is warm and dry.  Neurological:     General: No focal deficit present.     Mental Status: She is alert and oriented to person, place, and time. Mental status is at baseline.  Psychiatric:        Mood and Affect: Mood normal.        Behavior: Behavior normal.        Thought Content: Thought content normal.        Judgment: Judgment normal.    BP 126/70   Pulse (!) 106   Temp 98.2 F (36.8 C) (Temporal)   Resp 16   Ht 5' (1.524 m)   Wt 86 lb 6.4 oz (39.2 kg)   LMP 11/04/2013   SpO2 97%   BMI 16.87 kg/m  Wt Readings from Last 3 Encounters:  11/26/21 86 lb 6.4 oz (39.2 kg)  11/22/21 85 lb (38.6 kg)  08/20/21 85 lb (38.6 kg)     Health Maintenance Due  Topic Date Due   TETANUS/TDAP  Never done   PAP SMEAR-Modifier  02/11/2014   Pneumococcal Vaccine 30-1 Years old (2 - PCV) 02/19/2014   MAMMOGRAM  11/07/2017   COVID-19 Vaccine (3 - Booster for Pfizer series) 05/26/2020   Zoster Vaccines- Shingrix (2 of 2) 01/07/2021   INFLUENZA VACCINE  07/21/2021    There are no preventive care reminders to display for this patient.  Lab Results  Component Value Date   TSH 2.550 05/30/2021   Lab Results  Component Value  Date   WBC 4.0 11/22/2021   HGB 10.5 (L) 11/22/2021   HCT 32.0 (L) 11/22/2021   MCV 109.2 (H) 11/22/2021   PLT 110 (L) 11/22/2021   Lab Results  Component Value Date   NA 139 11/22/2021   K 3.2 (L) 11/22/2021   CO2 23 11/22/2021   GLUCOSE 76 11/22/2021   BUN 14 11/22/2021   CREATININE <0.30 (L) 11/22/2021   BILITOT 1.1 11/22/2021   ALKPHOS 147 (H) 11/22/2021   AST 189 (H) 11/22/2021   ALT 43 11/22/2021   PROT 6.8 11/22/2021   ALBUMIN 4.7 11/22/2021   CALCIUM 8.7 (L) 11/22/2021   ANIONGAP 16 (H) 11/22/2021   EGFR 100 05/30/2021   GFR 155.91 02/28/2013   Lab Results  Component Value Date   CHOL 232 (H) 11/12/2020   Lab Results  Component Value Date   HDL 88 11/12/2020   Lab Results  Component Value Date   LDLCALC 131 (H) 11/12/2020   Lab Results  Component Value Date   TRIG 77 11/12/2020   Lab Results  Component Value Date   CHOLHDL 2.6 11/12/2020   Lab Results  Component Value Date   HGBA1C 4.9 11/12/2020      Assessment & Plan:   Problem List Items Addressed This Visit   None Visit Diagnoses  Hospital discharge follow-up    -  Primary   Relevant Orders   Ambulatory referral to Gastroenterology   Hematemesis with nausea       Relevant Orders   Ambulatory referral to Gastroenterology   Full incontinence of feces       Relevant Orders   Ambulatory referral to Gastroenterology   Blood in stool       Relevant Orders   Ambulatory referral to Gastroenterology       No orders of the defined types were placed in this encounter.   Follow-up: No follow-ups on file.   PLAN She has experienced concerning decline since I last saw her. Given her hematemesis and hemoptysis, I am very concerned for esophageal varices. I will send urgent referral to GI and reviewed strict ER precautions for her. I intended to collect labs today - in particular CBC w/ diff - but pt refused labs. We reviewed in depths the risks of doing this. Pt voices understanding.   She must cease all alcohol intake for the foreseeable future. She is  very frustrated by this but understanding.  Patient encouraged to call clinic with any questions, comments, or concerns.  Maximiano Coss, NP

## 2021-12-04 ENCOUNTER — Ambulatory Visit (INDEPENDENT_AMBULATORY_CARE_PROVIDER_SITE_OTHER): Payer: No Typology Code available for payment source | Admitting: Gastroenterology

## 2021-12-04 ENCOUNTER — Other Ambulatory Visit (INDEPENDENT_AMBULATORY_CARE_PROVIDER_SITE_OTHER): Payer: No Typology Code available for payment source

## 2021-12-04 ENCOUNTER — Encounter: Payer: Self-pay | Admitting: Gastroenterology

## 2021-12-04 VITALS — BP 152/84 | HR 62 | Ht 60.0 in | Wt 85.4 lb

## 2021-12-04 DIAGNOSIS — K920 Hematemesis: Secondary | ICD-10-CM

## 2021-12-04 DIAGNOSIS — R195 Other fecal abnormalities: Secondary | ICD-10-CM

## 2021-12-04 DIAGNOSIS — R634 Abnormal weight loss: Secondary | ICD-10-CM | POA: Diagnosis not present

## 2021-12-04 DIAGNOSIS — R932 Abnormal findings on diagnostic imaging of liver and biliary tract: Secondary | ICD-10-CM

## 2021-12-04 DIAGNOSIS — D7589 Other specified diseases of blood and blood-forming organs: Secondary | ICD-10-CM

## 2021-12-04 LAB — IBC PANEL
Iron: 129 ug/dL (ref 42–145)
Saturation Ratios: 42.5 % (ref 20.0–50.0)
TIBC: 303.8 ug/dL (ref 250.0–450.0)
Transferrin: 217 mg/dL (ref 212.0–360.0)

## 2021-12-04 LAB — COMPREHENSIVE METABOLIC PANEL
ALT: 31 U/L (ref 0–35)
AST: 158 U/L — ABNORMAL HIGH (ref 0–37)
Albumin: 4.4 g/dL (ref 3.5–5.2)
Alkaline Phosphatase: 197 U/L — ABNORMAL HIGH (ref 39–117)
BUN: 11 mg/dL (ref 6–23)
CO2: 27 mEq/L (ref 19–32)
Calcium: 9.2 mg/dL (ref 8.4–10.5)
Chloride: 99 mEq/L (ref 96–112)
Creatinine, Ser: 0.46 mg/dL (ref 0.40–1.20)
GFR: 108.75 mL/min (ref >60.00)
Glucose, Bld: 105 mg/dL — ABNORMAL HIGH (ref 70–99)
Potassium: 3.3 mEq/L — ABNORMAL LOW (ref 3.5–5.1)
Sodium: 142 mEq/L (ref 135–145)
Total Bilirubin: 0.7 mg/dL (ref 0.2–1.2)
Total Protein: 6.9 g/dL (ref 6.0–8.3)

## 2021-12-04 LAB — CBC WITH DIFFERENTIAL/PLATELET
Basophils Absolute: 0.1 10*3/uL (ref 0.0–0.1)
Basophils Relative: 2.4 % (ref 0.0–3.0)
Eosinophils Absolute: 0 10*3/uL (ref 0.0–0.7)
Eosinophils Relative: 0.4 % (ref 0.0–5.0)
HCT: 30.5 % — ABNORMAL LOW (ref 36.0–46.0)
Hemoglobin: 10 g/dL — ABNORMAL LOW (ref 12.0–15.0)
Lymphocytes Relative: 20.8 % (ref 12.0–46.0)
Lymphs Abs: 1 10*3/uL (ref 0.7–4.0)
MCHC: 32.8 g/dL (ref 30.0–36.0)
MCV: 107 fl — ABNORMAL HIGH (ref 78.0–100.0)
Monocytes Absolute: 0.8 10*3/uL (ref 0.1–1.0)
Monocytes Relative: 16.5 % — ABNORMAL HIGH (ref 3.0–12.0)
Neutro Abs: 2.8 10*3/uL (ref 1.4–7.7)
Neutrophils Relative %: 59.9 % (ref 43.0–77.0)
Platelets: 132 10*3/uL — ABNORMAL LOW (ref 150.0–400.0)
RBC: 2.85 Mil/uL — ABNORMAL LOW (ref 3.87–5.11)
RDW: 16.6 % — ABNORMAL HIGH (ref 11.5–15.5)
WBC: 4.6 10*3/uL (ref 4.0–10.5)

## 2021-12-04 LAB — VITAMIN B12: Vitamin B-12: 249 pg/mL (ref 211–911)

## 2021-12-04 LAB — GAMMA GT: GGT: 1292 U/L — ABNORMAL HIGH (ref 7–51)

## 2021-12-04 LAB — FERRITIN: Ferritin: 774 ng/mL — ABNORMAL HIGH (ref 10.0–291.0)

## 2021-12-04 LAB — FOLATE: Folate: 3.3 ng/mL — ABNORMAL LOW (ref >5.9)

## 2021-12-04 NOTE — Patient Instructions (Signed)
If you are age 54 or older, your body mass index should be between 23-30. Your Body mass index is 16.68 kg/m. If this is out of the aforementioned range listed, please consider follow up with your Primary Care Provider.  If you are age 85 or younger, your body mass index should be between 19-25. Your Body mass index is 16.68 kg/m. If this is out of the aformentioned range listed, please consider follow up with your Primary Care Provider.   You have been scheduled for an endoscopy and colonoscopy. Please follow the written instructions given to you at your visit today. Please pick up your prep supplies at the pharmacy within the next 1-3 days. If you use inhalers (even only as needed), please bring them with you on the day of your procedure.   You will be contacted by [facility] in the next 2 days to arrange a CT abdomen and Pelvis*.  The number on your caller ID will be [phone], please answer when they call.  If you have not heard from them in 2 days please call [phone] to schedule.     The [facility] providers would like to encourage you to use [facility] to communicate with providers for non-urgent requests or questions.  Due to long hold times on the telephone, sending your provider a message by [facility] may be a faster and more efficient way to get a response.  Please allow 48 business hours for a response.  Please remember that this is for non-urgent requests.   It was a pleasure to see you today!  Thank you for trusting me with your gastrointestinal care!    Scott E.Tomasa Rand, MD

## 2021-12-04 NOTE — Progress Notes (Signed)
HPI : Kelly Zuniga is a pleasant 55 year old female with a history of seizure disorder, anxiety depression and alcohol abuse who is referred to [place] by Maximiano Coss, NP after being seen in the emergency room December 3 with complaints of coughing up and vomiting blood.  Patient states that she has been having problems with dizziness and nausea for a while.  She describes her dizziness as vertiginous episodes of room spinning associated with nausea.  She is followed by neurology for this as well as for her seizures.  Her last seizure was about 2 weeks ago.  She has been vomiting off and on the last few weeks.  She is also having a persistent cough.  It is difficult to tell from the patient's description sometimes whether she is vomiting or coughing, as she appears to have both.  She describes coughing up blood, but also retching episodes which are productive mostly of phlegm, sometimes streaked with blood.  She describes an episode the day she went to the December 3 as vomiting bright red blood.  She has not had this happen again since then.  In the emergency room, her vitals were stable, her hemoglobin was 10.5 (down from 12) with an MCV of 109, BUN of 14, and a CT angio of the chest was performed given report of hemoptysis which was unremarkable.  A fecal occult blood test was positive.  The concern for an acute upper GI bleed was low, and she was discharged from emergency room with plans for outpatient follow-up. She denies any coffee-ground emesis.  She denies any melena.  She does have problems with intermittent diarrhea which she has had "her whole life".  Diarrhea episodes are typically reliably related to her diet.  When she eats something fried, she would typically have diarrhea sometimes with incontinence.  She estimates that about 90% the time she has normal formed stools, and only 10% of the time will she have diarrhea.  Typically her stools are brown.  She denies melena or hematochezia.. Patient  has had significant weight loss in the past year or so.  She reports her normal weight is typically around 108 pounds, but her weight today was 85 pounds.  This weight is overall stable it appears from about a year ago (she weighed 87 pounds November 12, 2020).  The patient states that the weight loss correlates with the onset of Topamax therapy when she is in discussions with her neurologist about potentially switching medications. She has a history of heavy alcohol use, but has decreased her intake for the past 2-3 years.  In the past, she states she would drink 1/5 of liquor a day.  Currently, she reports having only 2 drinks per day. She has has had elevated liver enzymes, AST predominant since earlier this year.  In the emergency room, her AST was 189, ALT 43, alk phos 147 with a T bili of 1.1.  In March, she underwent an ultrasound because of elevated liver enzymes and the ultrasound showed heterogenous hepatic parenchyma, and a dilated common bile duct of 14 mm.  A subsequent MRCP showed again a dilated common bile duct, 1 cm in size with abrupt concave cut off in the lower CBD concerning for a stone.  The pancreas appeared normal.  The liver again was consistent with diffuse steatosis.  No stones were seen in the gallbladder on either the ultrasound or the MRI.  She states that the possibility of GI referral and ERCP was discussed with her primary  doctor, but because she was not having symptoms this was not performed.  She is currently taking omeprazole once a day which she just started a few days ago.  She takes Phenergan as needed for nausea. She has never had an upper or lower endoscopy.   Past Medical History:  Diagnosis Date   Alcohol abuse    Allergy    Anemia    low iron   Anxiety    Asthma    Asthma    [facility] 11/09/2020   Depression    posibly bipolar disorder. 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   Hypertension    never on meds    Pneumonia    at 13 years   Seizure ([place]) 02/09/2021   Seizure ([place])    Substance abuse ([place])    [place] 11/09/2020     Past Surgical History:  Procedure Laterality Date   benign cyst     removed from left cheek   RADIOLOGY WITH ANESTHESIA N/A 04/15/2021   Procedure: MRI/CT OF ABDOMEN WITH ANESTHESIA;  Surgeon: Radiologist, Medication, MD;  Location: [facility];  Service: Radiology;  Laterality: N/A;   TONSILECTOMY, ADENOIDECTOMY, BILATERAL MYRINGOTOMY AND TUBES     Family History  Problem Relation Age of Onset   Coronary artery disease Other        1st degree relative female <50   Depression Other        Family Hx   Diabetes Other        Family Hx 1st degree relative   Ovarian cancer Other        Family Hx    Healthy Mother    Diabetes Father    Gout Father    Hypertension Father    Heart failure Father    Cancer Father    Heart attack Father    Social History   Tobacco Use   Smoking status: Every Day    Packs/day: 0.25    Types: Cigarettes   Smokeless tobacco: Never  Vaping Use   Vaping Use: Former  Substance Use Topics   Alcohol use: Yes    Comment: 1 glass of wine per day or less   Drug use: No   Current Outpatient Medications  Medication Sig Dispense Refill   albuterol (PROVENTIL) (2.5 MG/3ML) 0.083% nebulizer solution Take 2.5 mg by nebulization every 6 (six) hours as needed for wheezing or shortness of breath.     albuterol (VENTOLIN HFA) 108 (90 Base) MCG/ACT inhaler Inhale 2 puffs into the lungs See admin instructions. Inhale 2 puffs into the lungs every 4-6 hours as needed for shortness of breath or wheezing 18 g 0   B Complex-C (SUPER B COMPLEX/VITAMIN C PO) Take 1 tablet by mouth daily. With Folic Acid     BIOTIN PO Take 1 tablet by mouth in the morning.     cetirizine (ZYRTEC) 10 MG tablet Take 10 mg by mouth daily.     clonazePAM (KLONOPIN) 0.5 MG  tablet Take 1 tablet (0.5 mg total) by mouth 3 (three) times daily as needed for anxiety. 90 tablet 2   escitalopram (LEXAPRO) 10 MG tablet Take 10 mg by mouth 2 (two) times daily.     LOTEMAX SM 0.38 % GEL Apply 1 drop to eye 4 (four) times daily.     magnesium gluconate (MAGONATE)  500 MG tablet Take 500 mg by mouth daily.     Multiple Vitamins-Minerals (MULTI ADULT GUMMIES) CHEW Chew 2 tablets by mouth in the morning.     naproxen sodium (ALEVE) 220 MG tablet Take 220 mg by mouth 2 (two) times daily as needed (for mild pain).     omeprazole (PRILOSEC) 20 MG capsule Take 1 capsule (20 mg total) by mouth 2 (two) times daily before a meal. 60 capsule 0   thiamine 100 MG tablet Take 1 tablet (100 mg total) by mouth daily. 30 tablet 0   topiramate (TOPAMAX) 100 MG tablet Take 1 tablet (100 mg total) by mouth 2 (two) times daily. 60 tablet 6   triamcinolone (NASACORT) 55 MCG/ACT nasal inhaler Place 2 sprays into the nose daily. For allergies 1 Inhaler 11   No current facility-administered medications for this visit.   Allergies  Allergen Reactions   Acetaminophen Other (See Comments)    Makes LFT get elevated and causes liver problems/issues    Lithium Other (See Comments)    Causes the patient to have no appetite   Promethazine-Codeine Nausea And Vomiting and Other (See Comments)    Any codeine-based cough syrups cause N/V   Codeine Nausea And Vomiting   Morphine And Related Nausea And Vomiting     Review of Systems: All systems reviewed and negative except where noted in HPI.    CT Head Wo Contrast  Result Date: 11/22/2021 CLINICAL DATA:  Head trauma, seizures, weakness EXAM: CT HEAD WITHOUT CONTRAST CT CERVICAL SPINE WITHOUT CONTRAST TECHNIQUE: Multidetector CT imaging of the head and cervical spine was performed following the standard protocol without intravenous contrast. Multiplanar CT image reconstructions of the cervical spine were also generated. COMPARISON:  02/09/2021  FINDINGS: CT HEAD FINDINGS Brain: No evidence of acute infarction, hemorrhage, hydrocephalus, extra-axial collection or mass lesion/mass effect. Prominent frontal subdural spaces, similar to prior examination. Mild periventricular white matter hypodensity. Vascular: No hyperdense vessel or unexpected calcification. Skull: Normal. Negative for fracture or focal lesion. Sinuses/Orbits: No acute finding. Other: Soft tissue contusion of the left parietal scalp (series 3, image 19). CT CERVICAL SPINE FINDINGS Alignment: Normal. Skull base and vertebrae: No acute fracture. No primary bone lesion or focal pathologic process. Soft tissues and spinal canal: No prevertebral fluid or swelling. No visible canal hematoma. Disc levels: Focally mild disc space height loss and osteophytosis of C4 through C6. Upper chest: Negative. Other: None. IMPRESSION: 1. No acute intracranial pathology. Small-vessel white matter disease. 2. Prominent frontal subdural spaces, similar to prior examination and possibly related to cerebral volume loss or chronic subdural hygromas. 3. Soft tissue contusion of the left parietal scalp. 4. No fracture or static subluxation of the cervical spine. 5. Focally mild disc degenerative disease of the cervical spine. Electronically Signed   By: Delanna Ahmadi M.D.   On: 11/22/2021 15:38   CT Angio Chest PE W and/or Wo Contrast  Result Date: 11/22/2021 CLINICAL DATA:  PE suspected, hematemesis, seizures EXAM: CT ANGIOGRAPHY CHEST WITH CONTRAST TECHNIQUE: Multidetector CT imaging of the chest was performed using the standard protocol during bolus administration of intravenous contrast. Multiplanar CT image reconstructions and MIPs were obtained to evaluate the vascular anatomy. CONTRAST:  72m OMNIPAQUE IOHEXOL 350 MG/ML SOLN COMPARISON:  08/14/2021 FINDINGS: Cardiovascular: Satisfactory opacification of the pulmonary arteries to the segmental level. No evidence of pulmonary embolism. Normal heart size. Left  and right coronary artery calcifications. No pericardial effusion. Aortic atherosclerosis. Mediastinum/Nodes: No enlarged mediastinal, hilar, or axillary lymph nodes.  Thyroid gland, trachea, and esophagus demonstrate no significant findings. Lungs/Pleura: Mild, bandlike scarring of the left lung base. No pleural effusion or pneumothorax. Upper Abdomen: No acute abnormality. Musculoskeletal: No chest wall abnormality. No acute or significant osseous findings. Review of the MIP images confirms the above findings. IMPRESSION: 1. Negative examination for pulmonary embolism. 2. Coronary artery disease. Aortic Atherosclerosis (ICD10-I70.0). Electronically Signed   By: Delanna Ahmadi M.D.   On: 11/22/2021 15:33   CT Cervical Spine Wo Contrast  Result Date: 11/22/2021 CLINICAL DATA:  Neck trauma, pain, weakness. EXAM: CT CERVICAL SPINE WITHOUT CONTRAST TECHNIQUE: Multidetector CT imaging of the cervical spine was performed without intravenous contrast. Multiplanar CT image reconstructions were also generated. COMPARISON:  CT cervical spine dated 08/26/2020. FINDINGS: Alignment: Mild dextroscoliosis, possibly accentuated by patient positioning. Straightening of the normal cervical spine lordosis. No evidence of acute vertebral body subluxation. Skull base and vertebrae: No fracture line or displaced fracture fragment is seen. Facet joints are normally aligned. Soft tissues and spinal canal: No prevertebral fluid or swelling. No visible canal hematoma. Disc levels: Mild degenerative spondylosis within the mid/lower cervical spine, but no more than mild central canal stenosis at any level. Upper chest: Negative. Other: Bilateral carotid atherosclerosis. IMPRESSION: 1. No acute findings. No fracture or acute subluxation within the cervical spine. 2. Mild degenerative change of the mid/lower cervical spine. 3. Bilateral carotid atherosclerosis. Electronically Signed   By: Franki Cabot M.D.   On: 11/22/2021 15:39     CLINICAL DATA:  Right upper quadrant pain   EXAM: MRI ABDOMEN WITHOUT AND WITH CONTRAST (INCLUDING MRCP)   TECHNIQUE: Multiplanar multisequence MR imaging of the abdomen was performed both before and after the administration of intravenous contrast. Heavily T2-weighted images of the biliary and pancreatic ducts were obtained, and three-dimensional MRCP images were rendered by post processing.   CONTRAST:  47mL GADAVIST GADOBUTROL 1 MMOL/ML IV SOLN   COMPARISON:  Abdominal sonogram 03/18/2021   FINDINGS: Lower chest: No acute findings.   Hepatobiliary: There is marked diffuse hepatic steatosis. No suspicious focal liver abnormality.   The gallbladder appears normal. No gallstones, gallbladder sludge or gallbladder wall thickening. Increase caliber of the CBD measures 1 cm. Abrupt, concave cut off of the lower common bile duct lumen is noted which suggests a rounded filling defect such as a stone. This is best seen on image 18/3.   Pancreas: No mass, inflammatory changes, or other parenchymal abnormality identified.   Spleen:  Within normal limits in size and appearance.   Adrenals/Urinary Tract: Normal appearance of the adrenal glands. The kidneys are unremarkable. No mass or hydronephrosis identified.   Stomach/Bowel: Visualized portions within the abdomen are unremarkable.   Vascular/Lymphatic: No pathologically enlarged lymph nodes identified. No abdominal aortic aneurysm demonstrated.   Other:  No free fluid or fluid collections.   Musculoskeletal: No suspicious bone lesions identified.   IMPRESSION: 1. Dilated common bile duct with abrupt, concave cut off of the lower common bile duct lumen suggesting impacted stone within the distal CBD. 2. Normal appearance of the gallbladder. 3. Diffuse hepatic steatosis.   These results will be called to the ordering clinician or representative by the Radiologist Assistant, and communication documented in the PACS or  [employer].     Electronically Signed   By: Kerby Moors M.D.   On: 04/15/2021 09:55  CLINICAL DATA:  Elevated liver function tests   History of alcohol abuse   New onset seizure disorder   EXAM: ULTRASOUND ABDOMEN LIMITED RIGHT UPPER  QUADRANT   COMPARISON:  02/10/2021   FINDINGS: Gallbladder:   No gallstones or wall thickening visualized. No sonographic Murphy sign noted by sonographer.   Common bile duct:   Diameter: 14 mm   Liver:   Nonspecific heterogeneous appearance of hepatic parenchyma. No focal hepatic lesion portal vein is patent on color Doppler imaging with normal direction of blood flow towards the liver.   Other: None.   IMPRESSION: Dilated common bile duct measuring up to 14 mm may be due to a distal obstructing calculus, stricture, or mass.   Further evaluation with MRI/MRCP is recommended.     Electronically Signed   By: Miachel Roux M.D.   On: 03/18/2021 09:06     Physical Exam: BP (!) 152/84    Pulse 62    Ht 5' (1.524 m)    Wt 85 lb 6.4 oz (38.7 kg)    LMP 11/04/2013    BMI 16.68 kg/m  Constitutional: Pleasant, very thin Caucasian female in no acute distress.  Accompanied by mother.  Patient is unsteady on her feet, which she says is due to dizziness/vertigo.  Uses cane for ambulation HEENT: Normocephalic and atraumatic. Conjunctivae are normal. No scleral icterus. Neck supple.  Cardiovascular: Normal rate, regular rhythm.  Pulmonary/chest: Effort normal and breath sounds normal. No wheezing, rales or rhonchi. Abdominal: Soft, nondistended, nontender. Bowel sounds active throughout. There are no masses palpable. No hepatomegaly. Extremities: no edema Neurological: Alert and oriented to person place and time. Skin: Skin is warm and dry. No rashes noted.  No spider angiomas Psychiatric: Normal mood and affect. Behavior is normal.  CBC    Component Value Date/Time   WBC 4.0 11/22/2021 1125   RBC 2.93 (L) 11/22/2021 1125    HGB 10.5 (L) 11/22/2021 1125   HGB 12.0 05/30/2021 1035   HCT 32.0 (L) 11/22/2021 1125   HCT 33.9 (L) 05/30/2021 1035   PLT 110 (L) 11/22/2021 1125   PLT 223 05/30/2021 1035   MCV 109.2 (H) 11/22/2021 1125   MCV 98 (H) 05/30/2021 1035   MCH 35.8 (H) 11/22/2021 1125   MCHC 32.8 11/22/2021 1125   RDW 16.0 (H) 11/22/2021 1125   RDW 13.1 05/30/2021 1035   LYMPHSABS 1.2 11/22/2021 1125   LYMPHSABS 1.3 02/24/2021 1707   MONOABS 0.5 11/22/2021 1125   EOSABS 0.1 11/22/2021 1125   EOSABS 0.2 02/24/2021 1707   BASOSABS 0.1 11/22/2021 1125   BASOSABS 0.2 02/24/2021 1707    CMP     Component Value Date/Time   NA 139 11/22/2021 1125   NA 143 05/30/2021 1035   K 3.2 (L) 11/22/2021 1125   CL 100 11/22/2021 1125   CO2 23 11/22/2021 1125   GLUCOSE 76 11/22/2021 1125   BUN 14 11/22/2021 1125   BUN 10 05/30/2021 1035   CREATININE <0.30 (L) 11/22/2021 1125   CALCIUM 8.7 (L) 11/22/2021 1125   PROT 6.8 11/22/2021 1125   PROT 6.8 07/14/2021 1423   ALBUMIN 4.7 11/22/2021 1125   ALBUMIN 4.7 07/14/2021 1423   AST 189 (H) 11/22/2021 1125   ALT 43 11/22/2021 1125   ALKPHOS 147 (H) 11/22/2021 1125   BILITOT 1.1 11/22/2021 1125   BILITOT 0.5 07/14/2021 1423   GFRNONAA NOT CALCULATED 11/22/2021 1125   GFRAA 90 11/12/2020 1554     ASSESSMENT AND PLAN: 55 year old female with seizure disorder and persistent dizziness with nausea, with 20 pounds of unintentional weight loss over the past year, attributed to Topamax.  She is also been having frequent  vomiting including some blood-tinged emesis.  It does not sound like she has had significant true hematemesis, and has not had melena.  My suspicion for a high risk upper GI bleed from an etiology such as gastroesophageal varices or peptic ulcer disease is low, but she absolutely does need an upper and lower endoscopy given her symptoms, her anemia and her weight loss.  I recommended we repeat a CBC and CMP now, and if she has had a significant drop in  hemoglobin since her ER visit, we should consider admission.  If her hemoglobin is stable, recommend we proceed with elective outpatient endoscopic evaluation.  I also recommended a CT of the abdomen and pelvis given the profound weight loss as well as to reassess the dilated common bile duct..  For her anemia, we will check iron and B12 levels.  Her macrocytosis and her AST/ALT ratio are highly suggestive of alcohol as the underlying etiology for her anemia.  I counseled her on recommended safe alcohol consumption levels.  Normally, 1 drink per day is the upper limit recommended for females.  However, given her known steatosis, as well as her very small stature, she is very high risk for alcohol induced liver injury, and therefore no alcohol is really the only safe level for her at this time.  Unintentional weight loss - EGD/colonoscopy - CT abdomen/pelvis  Questionable hematemesis - Repeat CBC, CMP now, ED referral if significant drop in hemoglobin - EGD as above -Continue omeprazole  Macrocytic anemia, likely alcohol - Iron panel, B12/folate  History of dilated common bile duct on ultrasound and MRCP -CT as above, if persistently dilated will discuss scheduling for endoscopic ultrasound (liver and enzymes not consistent with biliary obstruction)  Positive fecal occult blood test - Colonoscopy as above  Alcohol abuse -Counseled that even 2 drinks per day is too much and that she needs to work towards complete abstinence. - GGT   The details, risks (including bleeding, perforation, infection, missed lesions, medication reactions and possible hospitalization or surgery if complications occur), benefits, and alternatives to EGD/colonoscopy with possible biopsy and possible polypectomy were discussed with the patient and she consents to proceed.   Jvion Turgeon E. Candis Schatz, MD [facility]   CC:  Maximiano Coss, NP

## 2021-12-05 NOTE — Progress Notes (Signed)
[place],  Your hemoglobin has been stable, making the likelihood of significant bleeding from the GI tract very unlikely.  Your liver enzymes are very indicative of liver damage from alcohol use.  The GGT, in particular, is more specific for alcohol related liver injury, and yours was markedly elevated (1,292 when normal is less than 50).  I suspect your anemia may also be related to damage to your bone marrow from alcohol.  You need to stop drinking now.  I am very concerned about the damage alcohol is doing to your body. Your folate level is low.  You can buy over the counter folic acid supplements, or we may be able to prescribe some for now.  Your potassium levels are also low.  I recommend eating a banana daily, or we can order some potassium supplements to take daily. Please get back with [place] and let us know if you would like these prescriptions and if you have any further questions about the lab results.

## 2021-12-08 ENCOUNTER — Encounter: Payer: Self-pay | Admitting: Gastroenterology

## 2021-12-24 DIAGNOSIS — R0902 Hypoxemia: Secondary | ICD-10-CM | POA: Diagnosis not present

## 2021-12-24 DIAGNOSIS — G40901 Epilepsy, unspecified, not intractable, with status epilepticus: Secondary | ICD-10-CM | POA: Diagnosis not present

## 2021-12-24 DIAGNOSIS — R410 Disorientation, unspecified: Secondary | ICD-10-CM | POA: Diagnosis not present

## 2021-12-24 DIAGNOSIS — R569 Unspecified convulsions: Secondary | ICD-10-CM | POA: Diagnosis not present

## 2021-12-24 DIAGNOSIS — I10 Essential (primary) hypertension: Secondary | ICD-10-CM | POA: Diagnosis not present

## 2021-12-26 ENCOUNTER — Other Ambulatory Visit: Payer: Self-pay | Admitting: Adult Health

## 2021-12-26 DIAGNOSIS — F411 Generalized anxiety disorder: Secondary | ICD-10-CM

## 2021-12-26 NOTE — Telephone Encounter (Signed)
Tanysha is requesting a refill on her Clonazepam and Escitalopram called to:  [facility] [place], [place]  Phone:  [phone]  Fax:  [phone]

## 2021-12-26 NOTE — Telephone Encounter (Signed)
Pt has not been seen or prescribed these meds since 2021.Please call and schedule appt

## 2021-12-26 NOTE — Telephone Encounter (Signed)
Voicemail full unable to leave a message. She needs to have a 40 min appointment

## 2021-12-31 ENCOUNTER — Other Ambulatory Visit: Payer: Self-pay | Admitting: Adult Health

## 2021-12-31 NOTE — Telephone Encounter (Signed)
Kelly Zuniga has got an appointment on 01/07/22 for 40 minutes. She keeps calling every hour to see if her refill has been called in.

## 2021-12-31 NOTE — Telephone Encounter (Signed)
Will discuss restarting medications at appointment.

## 2021-12-31 NOTE — Telephone Encounter (Signed)
Pt informed

## 2022-01-01 ENCOUNTER — Other Ambulatory Visit: Payer: Self-pay

## 2022-01-01 ENCOUNTER — Telehealth: Payer: Self-pay | Admitting: Gastroenterology

## 2022-01-01 MED ORDER — PLENVU 140 G PO SOLR: ORAL | 0 refills | Status: DC

## 2022-01-01 NOTE — Telephone Encounter (Signed)
Patient called and stated that she did not receive the flavoring for her prep medication. Seeking advice, Please advise.   [phone]

## 2022-01-01 NOTE — Telephone Encounter (Signed)
Pts prep was not sent into the pharmacy. Plenvu prescription sent to the pharmacy, pt aware.

## 2022-01-02 ENCOUNTER — Telehealth: Payer: Self-pay | Admitting: Internal Medicine

## 2022-01-02 NOTE — Telephone Encounter (Signed)
Plenvu not available Needs alternative On hold long time I will call pharmacy again tomorrow Antony Haste made aware

## 2022-01-03 MED ORDER — NA SULFATE-K SULFATE-MG SULF 17.5-3.13-1.6 GM/177ML PO SOLN
1.0000 | Freq: Once | ORAL | 0 refills | Status: AC
Start: 2022-01-03 — End: 2022-01-03

## 2022-01-03 NOTE — Telephone Encounter (Signed)
Prep changed to SuPrep  New instructions sent by My Chart  Father Romelle Starcher aware

## 2022-01-05 ENCOUNTER — Ambulatory Visit: Payer: No Typology Code available for payment source | Admitting: Gastroenterology

## 2022-01-05 DIAGNOSIS — R112 Nausea with vomiting, unspecified: Secondary | ICD-10-CM

## 2022-01-05 DIAGNOSIS — R569 Unspecified convulsions: Secondary | ICD-10-CM

## 2022-01-05 DIAGNOSIS — R195 Other fecal abnormalities: Secondary | ICD-10-CM

## 2022-01-05 NOTE — Progress Notes (Signed)
[facility] History and Physical   Primary Care Physician:  Janeece AgeeMorrow, Richard, NP   Reason for Procedure:   Unintentional weight loss, positive FOBT, history of hematemesis, nausea/vomiting  Plan:    EGD/colonoscopy:  Due to patient's recent seizure, the decision was made to [facility].  We will plan to reschedule her procedures for the hospital setting.  We also discussed how she still needs to schedule the CT that was ordered last month.  If the CT shows persistent CBD dilation, then an EUS may be needed.     HPI: Kelly Zuniga is a 56 y.o. female undergoing scheduled for EGD and colonoscopy to evaluate chronic symptoms of nausea/vomiting and unintentional weight loss.  She has had an episode of hematemesis as well over 6 weeks ago.  She has not had any further episodes of hematemesis since then.  She has been doing better from a GI standpoint since her clinic visit, but yesterday she experienced another seizure/syncopal episode resulting in superificial facial trauma/bruising.  This was her first seizure in the past 6 weeks or so.  She states these syncopal-like episodes are typical for her seizures.  She had been taking her Topamax as instructed, except for this morning.  She has a follow up with her neurologist later this month.  Past Medical History:  Diagnosis Date   Alcohol abuse    Allergy    Anemia    low iron   Anxiety    Asthma    Asthma    Phreesia 11/09/2020   Depression    posibly bipolar disorder. 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 Authorizing Provider  albuterol (PROVENTIL) (2.5 MG/3ML) 0.083% nebulizer solution Take 2.5 mg by nebulization every 6 (six) hours as needed for wheezing or shortness of breath. 09/13/15   [provider]  albuterol (VENTOLIN HFA) 108 (90 Base) MCG/ACT inhaler Inhale 2 puffs into the lungs See admin instructions. Inhale 2 puffs into the lungs every 4-6 hours as needed for shortness of breath or wheezing 08/19/21   Shade FloodGreene, Jeffrey R, MD  B Complex-C (SUPER B COMPLEX/VITAMIN C PO) Take 1 tablet by mouth daily. With Folic Acid    [provider]  BIOTIN PO Take 1 tablet by mouth in the morning.    [provider]  cetirizine (ZYRTEC) 10 MG tablet Take 10 mg by mouth daily.    [provider]  clonazePAM (KLONOPIN) 0.5 MG tablet Take 1 tablet (0.5 mg total) by mouth 3 (three) times daily as needed for anxiety. 11/11/20   Mozingo, Thereasa Soloegina Nattalie, NP  escitalopram (LEXAPRO) 10 MG tablet Take 10 mg by  mouth 2 (two) times daily.    [provider]  LOTEMAX SM 0.38 % GEL Apply 1 drop to eye 4 (four) times daily. 07/28/21   [provider]  magnesium gluconate (MAGONATE) 500 MG tablet Take 500 mg by mouth daily.    [provider]  Multiple Vitamins-Minerals (MULTI ADULT GUMMIES) CHEW Chew 2 tablets by mouth in the morning.    [provider]  naproxen sodium (ALEVE) 220 MG tablet Take 220 mg by mouth 2 (two) times daily as needed (for mild pain).    [provider]  omeprazole (PRILOSEC) 20 MG capsule Take 1 capsule (20 mg total) by mouth 2 (two) times daily before a meal. 11/22/21   Derwood Kaplan, MD   thiamine 100 MG tablet Take 1 tablet (100 mg total) by mouth daily. 02/13/21   Joseph Art, DO  topiramate (TOPAMAX) 100 MG tablet Take 1 tablet (100 mg total) by mouth 2 (two) times daily. 04/17/21   Levert Feinstein, MD  triamcinolone (NASACORT) 55 MCG/ACT nasal inhaler Place 2 sprays into the nose daily. For allergies 02/23/13   Armandina Stammer I, NP    Current Outpatient Medications  Medication Sig Dispense Refill   albuterol (PROVENTIL) (2.5 MG/3ML) 0.083% nebulizer solution Take 2.5 mg by nebulization every 6 (six) hours as needed for wheezing or shortness of breath.     albuterol (VENTOLIN HFA) 108 (90 Base) MCG/ACT inhaler Inhale 2 puffs into the lungs See admin instructions. Inhale 2 puffs into the lungs every 4-6 hours as needed for shortness of breath or wheezing 18 g 0   B Complex-C (SUPER B COMPLEX/VITAMIN C PO) Take 1 tablet by mouth daily. With Folic Acid     BIOTIN PO Take 1 tablet by mouth in the morning.     cetirizine (ZYRTEC) 10 MG tablet Take 10 mg by mouth daily.     clonazePAM (KLONOPIN) 0.5 MG tablet Take 1 tablet (0.5 mg total) by mouth 3 (three) times daily as needed for anxiety. 90 tablet 2   escitalopram (LEXAPRO) 10 MG tablet Take 10 mg by mouth 2 (two) times daily.     LOTEMAX SM 0.38 % GEL Apply 1 drop to eye 4 (four) times daily.     magnesium gluconate (MAGONATE) 500 MG tablet Take 500 mg by mouth daily.     Multiple Vitamins-Minerals (MULTI ADULT GUMMIES) CHEW Chew 2 tablets by mouth in the morning.     naproxen sodium (ALEVE) 220 MG tablet Take 220 mg by mouth 2 (two) times daily as needed (for mild pain).     omeprazole (PRILOSEC) 20 MG capsule Take 1 capsule (20 mg total) by mouth 2 (two) times daily before a meal. 60 capsule 0   thiamine 100 MG tablet Take 1 tablet (100 mg total) by mouth daily. 30 tablet 0   topiramate (TOPAMAX) 100 MG tablet Take 1 tablet (100 mg total) by mouth 2 (two) times daily. 60 tablet 6   triamcinolone (NASACORT) 55 MCG/ACT nasal inhaler  Place 2 sprays into the nose daily. For allergies 1 Inhaler 11   No current facility-administered medications for this visit.    Allergies as of 01/05/2022 - Review Complete 12/08/2021  Allergen Reaction Noted   Acetaminophen Other (See Comments) 02/11/2011   Lithium Other (See Comments) 12/26/2010   Promethazine-codeine Nausea And Vomiting and Other (See Comments) 02/09/2021   Codeine Nausea And Vomiting 11/09/2020   Morphine and related Nausea And Vomiting 11/09/2020    Family History  Problem  Relation Age of Onset   Coronary artery disease Other        1st degree relative female <50   Depression Other        Family Hx   Diabetes Other        Family Hx 1st degree relative   Ovarian cancer Other        Family Hx    Healthy Mother    Diabetes Father    Gout Father    Hypertension Father    Heart failure Father    Cancer Father    Heart attack Father     Social History   Socioeconomic History   Marital status: Significant Other    Spouse name: Not on file   Number of children: 0   Years of education: college   Highest education level: Professional school degree (e.g., MD, DDS, DVM, JD)  Occupational History   Occupation: Occupational psychologist  Tobacco Use   Smoking status: Every Day    Packs/day: 0.25    Types: Cigarettes   Smokeless tobacco: Never  Vaping Use   Vaping Use: Former  Substance and Sexual Activity   Alcohol use: Yes    Comment: 1 glass of wine per day or less   Drug use: No   Sexual activity: Not on file  Other Topics Concern   Not on file  Social History Narrative   Lives with significant other and his two children.   Right-handed.   No daily caffeine.   Social Determinants of Health   Financial Resource Strain: Not on file  Food Insecurity: Not on file  Transportation Needs: Not on file  Physical Activity: Not on file  Stress: Not on file  Social Connections: Not on file  Intimate Partner Violence: Not on file    Review of Systems:  All  other review of systems negative except as mentioned in the HPI.  Physical Exam: Vital signs LMP 11/04/2013   General:   Alert,  Well-developed, thin Caucasian female, pleasant and cooperative in NAD; uses walker for ambulation.  Ecchymoses/swelling of chin and lower lip Neuro/Psych:  Normal mood and affect. A and O x 3   Monetta Lick E. Tomasa Rand, MD [facility]

## 2022-01-05 NOTE — Progress Notes (Signed)
Patient stated she had seizure yesterday and fell hitting her face.  Presented today with bruising on chin and cut on lip. She also stated she did not take her seizure medication today.  Spoke with Dr. Candis Schatz and patient's procedures will be canceled.  Patient advised to contact her neurologist immediately.

## 2022-01-06 ENCOUNTER — Telehealth: Payer: Self-pay | Admitting: Neurology

## 2022-01-06 NOTE — Telephone Encounter (Signed)
I called the patient who reports a seizure on 01/04/2022. She was going to bathroom during the night then the next thing she remembers is waking up in the floor. She is unsure how long she laid there. Says she has visible bruising from the event (legs, arms, face). She wears a panic alarm around her neck and pushed the button when she woke up. Her significant other, who lives with her, helped her to the couch. She returned to baseline shortly afterwards. Currently on topiramate 100mg , one tab BID. Denies any missed doses. She would like to be seen since she had to miss her last appt.

## 2022-01-06 NOTE — Telephone Encounter (Signed)
The patient is aware. She will continue her medication and keep her appt on 01/08/22. Follow up on 01/19/22 has been canceled.

## 2022-01-06 NOTE — Telephone Encounter (Signed)
She is on schedule for Jan 19th, cancel Jan 30 appt, will go over in detail at office visit

## 2022-01-06 NOTE — Telephone Encounter (Signed)
At 11:53 pt left a vm stating Sunday she had a major seizure and wanted to know if Dr Terrace Arabia would like to see her before.  Pt states she is fine with the appointment on 01-30 but is open to seeing Dr Terrace Arabia before if that is what Dr Terrace Arabia would like to do.

## 2022-01-07 ENCOUNTER — Ambulatory Visit (INDEPENDENT_AMBULATORY_CARE_PROVIDER_SITE_OTHER): Payer: No Typology Code available for payment source | Admitting: Adult Health

## 2022-01-07 ENCOUNTER — Other Ambulatory Visit: Payer: Self-pay

## 2022-01-07 ENCOUNTER — Encounter: Payer: Self-pay | Admitting: Adult Health

## 2022-01-07 DIAGNOSIS — F411 Generalized anxiety disorder: Secondary | ICD-10-CM | POA: Diagnosis not present

## 2022-01-07 DIAGNOSIS — F331 Major depressive disorder, recurrent, moderate: Secondary | ICD-10-CM | POA: Diagnosis not present

## 2022-01-07 DIAGNOSIS — G4700 Insomnia, unspecified: Secondary | ICD-10-CM | POA: Diagnosis not present

## 2022-01-07 MED ORDER — ESCITALOPRAM OXALATE 20 MG PO TABS: 20.0000 mg | ORAL_TABLET | Freq: Every day | ORAL | 3 refills | Status: DC

## 2022-01-07 NOTE — Progress Notes (Signed)
Kelly Zuniga [id number] 03/07/1966 56 y.o.  Subjective:   Patient ID:  Kelly Zuniga is a 56 y.o. (DOB 03/07/1966) female.  Chief Complaint: No chief complaint on file.   HPI Kelly Zuniga presents to the office today for follow-up of MDD, GAD, and insomnia.  Accompanied by partner.  Describes mood today as "not the best". Pleasant. Denies tearfulness. Mood symptoms - reports anxiety, depression, and irritability. Increased worry and rumination. Reporting multiple medical issues over the past year. Has been off of medications for the past 2 months. Would like to restart the Lexapro. Decreased interest and motivation. Taking medications as prescribed.  Energy levels lower. Active, has a regular exercise routine. Enjoys some usual interests and activities. Lives with partner and his 2 children [id number] and [id number]. Has a dog - rescue. Spending time with family. Appetite adequate - eating pretty well. Weigt stable - 85 pounds. Sleeps well most nights.  Averages 5 hours a night. Napping during the day. Focus and concentration stable. Completing tasks. Managing aspects of household. Unable to work with declining health. Denies SI or HI.  Denies AH or VH.  Previous medication trials: Xanax, Zoloft    CAGE-AID    Flowsheet Row ED to Hosp-Admission (Discharged) [facility]  CAGE-AID Score 1      PHQ2-9    Flowsheet Row [facility] Visit from 02/24/2021 [facility] from [facility]20[facility]  PHQ-2 Total Score 2 2 0 0 0  PHQ-9 Total Score 23 10 -- -- --      Flowsheet Row ED [facility] (Discharged) from  04/15/2021 in [facility] ED to Hosp-Admission (Discharged) [facility]  C-SSRS RISK CATEGORY No Risk No Risk No Risk        Review of Systems:  Review of Systems  Musculoskeletal:  Negative for gait problem.  Neurological:  Negative for tremors.  Psychiatric/Behavioral:         Please refer to HPI   Medications: I have reviewed the patient's current medications.  Current Outpatient Medications  Medication Sig Dispense Refill   albuterol (PROVENTIL) (2.5 MG/3ML) 0.083% nebulizer solution Take 2.5 mg by nebulization every 6 (six) hours as needed for wheezing or shortness of breath.     albuterol (VENTOLIN HFA) 108 (90 Base) MCG/ACT inhaler Inhale 2 puffs into the lungs See admin instructions. Inhale 2 puffs into the lungs every 4-6 hours as needed for shortness of breath or wheezing 18 g 0   B Complex-C (SUPER B COMPLEX/VITAMIN C PO) Take 1 tablet by mouth daily. With Folic Acid     BIOTIN PO Take 1 tablet by mouth in the morning.     cetirizine (ZYRTEC) 10 MG tablet Take 10 mg by mouth daily.     clonazePAM (KLONOPIN) 0.5 MG tablet Take 1 tablet (0.5 mg total) by mouth 3 (three) times daily as needed for anxiety. 90 tablet 2   escitalopram (LEXAPRO) 20 MG tablet Take 1 tablet (20 mg total) by mouth daily. 90 tablet 3   LOTEMAX SM 0.38 % GEL Apply 1 drop to eye 4 (four) times daily.     magnesium gluconate (MAGONATE) 500 MG tablet Take 500 mg by mouth  daily.     Multiple Vitamins-Minerals (MULTI ADULT GUMMIES) CHEW Chew 2 tablets by mouth in the morning.     naproxen sodium (ALEVE) 220 MG tablet Take 220 mg by mouth 2 (two) times daily as needed (for mild pain).     omeprazole (PRILOSEC) 20 MG capsule Take 1 capsule (20 mg total) by mouth 2 (two) times daily before a meal. 60 capsule 0   thiamine 100 MG tablet Take 1 tablet (100 mg total) by mouth daily. 30 tablet 0   topiramate (TOPAMAX) 100 MG tablet Take 1 tablet (100 mg  total) by mouth 2 (two) times daily. 60 tablet 6   triamcinolone (NASACORT) 55 MCG/ACT nasal inhaler Place 2 sprays into the nose daily. For allergies 1 Inhaler 11   No current facility-administered medications for this visit.    Medication Side Effects: None  Allergies:  Allergies  Allergen Reactions   Acetaminophen Other (See Comments)    Makes LFT get elevated and causes liver problems/issues    Lithium Other (See Comments)    Causes the patient to have no appetite   Promethazine-Codeine Nausea And Vomiting and Other (See Comments)    Any codeine-based cough syrups cause N/V   Codeine Nausea And Vomiting   Morphine And Related Nausea And Vomiting    Past Medical History:  Diagnosis Date   Alcohol abuse    Allergy    Anemia    low iron   Anxiety    Asthma    Asthma    Phreesia 11/09/2020   Depression    posibly bipolar disorder. 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   Please see review of systems for further details on the patient's review from today.   Objective:   Physical Exam:  LMP 11/04/2013   Physical Exam Constitutional:      General: She is not in acute distress. Musculoskeletal:        General: No deformity.  Neurological:     Mental Status: She is alert and oriented to person, place, and time.     Coordination: Coordination normal.  Psychiatric:        Attention and Perception: Attention and perception normal. She does not perceive auditory or visual hallucinations.        Mood and Affect: Mood normal. Mood is not anxious or  depressed. Affect is not labile, blunt, angry or inappropriate.        Speech: Speech normal.        Behavior: Behavior normal.        Thought Content: Thought content normal. Thought content is not paranoid or delusional. Thought content does not include homicidal or suicidal ideation. Thought content does not include homicidal or suicidal plan.        Cognition and Memory: Cognition and memory normal.        Judgment: Judgment normal.     Comments: Insight intact    Lab Review:     Component Value Date/Time   NA 142 12/04/2021  1054   NA 143 05/30/2021 1035   K 3.3 (L) 12/04/2021 1054   CL 99 12/04/2021 1054   CO2 27 12/04/2021 1054   GLUCOSE 105 (H) 12/04/2021 1054   BUN 11 12/04/2021 1054   BUN 10 05/30/2021 1035   CREATININE 0.46 12/04/2021 1054   CALCIUM 9.2 12/04/2021 1054   PROT 6.9 12/04/2021 1054   PROT 6.8 07/14/2021 1423   ALBUMIN 4.4 12/04/2021 1054   ALBUMIN 4.7 07/14/2021 1423   AST 158 (H) 12/04/2021 1054   ALT 31 12/04/2021 1054   ALKPHOS 197 (H) 12/04/2021 1054   BILITOT 0.7 12/04/2021 1054   BILITOT 0.5 07/14/2021 1423   GFRNONAA NOT CALCULATED 11/22/2021 1125   GFRAA 90 11/12/2020 1554       Component Value Date/Time   WBC 4.6 12/04/2021 1054   RBC 2.85 (L) 12/04/2021 1054   HGB 10.0 (L) 12/04/2021 1054   HGB 12.0 05/30/2021 1035   HCT 30.5 (L) 12/04/2021 1054   HCT 33.9 (L) 05/30/2021 1035   PLT 132.0 (L) 12/04/2021 1054   PLT 223 05/30/2021 1035   MCV 107.0 (H) 12/04/2021 1054   MCV 98 (H) 05/30/2021 1035   MCH 35.8 (H) 11/22/2021 1125   MCHC 32.8 12/04/2021 1054   RDW 16.6 (H) 12/04/2021 1054   RDW 13.1 05/30/2021 1035   LYMPHSABS 1.0 12/04/2021 1054   LYMPHSABS 1.3 02/24/2021 1707   MONOABS 0.8 12/04/2021 1054   EOSABS 0.0 12/04/2021 1054   EOSABS 0.2 02/24/2021 1707   BASOSABS 0.1 12/04/2021 1054   BASOSABS 0.2 02/24/2021 1707    Lithium Lvl  Date Value Ref Range Status  02/09/2021 <0.06 (L) 0.60 - 1.20 mmol/L Final    Comment:     Performed at [facility], [address] [place], [place] [address]     No results found for: PHENYTOIN, PHENOBARB, VALPROATE, CBMZ   .res Assessment: Plan:    Plan:  PDMP reviewed  Lexapro 20mg  daily   RTC 1 year  Time spent with patient was 30 minutes. Greater than 50% of face to face time with patient was spent on counseling and coordination of care.    Patient advised to contact office with any questions, adverse effects, or acute worsening in signs and symptoms.  Add partner to ROI form.  Diagnoses and all orders for this visit:  Major depressive disorder, recurrent episode, moderate (HCC) -     escitalopram (LEXAPRO) 20 MG tablet; Take 1 tablet (20 mg total) by mouth daily.  Generalized anxiety disorder  Insomnia, unspecified type     Please see After Visit Summary for patient specific instructions.  Future Appointments  Date Time Provider Department Center  01/08/2022  1:00 PM 01/10/2022, MD [facility] None  02/11/2022  9:00 AM 02/13/2022, MD [facility]  02/17/2022  8:10 AM 02/19/2022, NP [facility]    No orders of the defined types were placed in this encounter.   -------------------------------

## 2022-01-08 ENCOUNTER — Ambulatory Visit (INDEPENDENT_AMBULATORY_CARE_PROVIDER_SITE_OTHER): Payer: No Typology Code available for payment source | Admitting: Neurology

## 2022-01-08 ENCOUNTER — Encounter: Payer: Self-pay | Admitting: Neurology

## 2022-01-08 VITALS — BP 131/83 | HR 121 | Ht 60.0 in | Wt 84.5 lb

## 2022-01-08 DIAGNOSIS — G43709 Chronic migraine without aura, not intractable, without status migrainosus: Secondary | ICD-10-CM | POA: Diagnosis not present

## 2022-01-08 DIAGNOSIS — R269 Unspecified abnormalities of gait and mobility: Secondary | ICD-10-CM | POA: Diagnosis not present

## 2022-01-08 DIAGNOSIS — R569 Unspecified convulsions: Secondary | ICD-10-CM

## 2022-01-08 MED ORDER — LAMOTRIGINE 25 MG PO TABS: ORAL_TABLET | ORAL | 0 refills | Status: DC

## 2022-01-08 MED ORDER — THIAMINE HCL 100 MG PO TABS: 100.0000 mg | ORAL_TABLET | Freq: Every day | ORAL | 4 refills | Status: DC

## 2022-01-08 MED ORDER — LAMOTRIGINE 100 MG PO TABS: 100.0000 mg | ORAL_TABLET | Freq: Two times a day (BID) | ORAL | 11 refills | Status: AC

## 2022-01-08 NOTE — Patient Instructions (Signed)
Meds ordered this encounter  Medications   lamoTRIgine (LAMICTAL) 25 MG tablet----use this one first,     Sig: 1 tab bid x one week 2 tab bid x 2nd week 3 tab bid x 3rd week    Dispense:  84 tablet       Only refill after you finish and can tolerate Lamotrigine 25mg  tab   lamoTRIgine (LAMICTAL) 100 MG tablet    Sig: Take 1 tablet (100 mg total) by mouth 2 (two) times daily.    Dispense:  60 tablet    Refill:  11

## 2022-01-08 NOTE — Progress Notes (Signed)
HISTORY : Kelly Zuniga, is a 56 year old female, seen in request by of primary care nurse practitioner [name], for evaluation of passing out spells, initial evaluation was on April 17, 2021   I reviewed and summarized the referring note. PMHX. Asthma Depression, anxiety,  Smoking History of alcohol abuse,     She currently works as of Occupational psychologist at [employer], on February 09, 2021, she was taken to hospital by ambulance, that morning she started working at 6 AM, about 10:30 AM, 30 minutes before the end of her shift, she remember hanging some cloth on the rack in a standing position, then she had sudden onset loss of consciousness, she denies warning signs, ambulance was called, there was reported seizure-like activity per ER record, she denies tongue biting, urinary incontinence,   Extensive evaluation at hospital, MRI of the brain without contrast showed no acute abnormality, mild atrophy, small vessel disease, small right parietal scalp hematoma   She was not able to go back to work ever since, patient reported 2 more episode of passing out, but was not able to elaborate on details, it happened without witness, 1 episode she woke up feel confused, seems to have a spell, the other episode she woke up fell to the ground for unknown length of time,   She had a history of alcohol abuse, 2014 hospital admission for alcohol cessation, alcohol level was 259 at that time, at this time less than 10, she denied excessive alcohol use, only reported mild use, UDS was negative, laboratory evaluation showed no other treatable etiology,   Appointment with his cardiologist is pending may   She was given Keppra 500 mg grams twice daily since hospital admission, tolerating medication well, long history of migraine headaches, taking Topamax 25 mg twice a day, tolerating it well, no longer has frequent headaches  UPDATE Jan 08 2022: Patient and her husband were both very upset about her progress over  the past 1 year, since hospital admission in February 2022, she was not able to function at a previous level, now continues struggle with poor appetite, slow weight loss, spend most of her time in sitting position, significant gait abnormality, intermittent memory loss  She has frequent fall at home, came in with left chin bruise, there was no recurrent seizure, but she continue to have frequent lightheaded position, especially standing up,  She is now on Topamax 100 mg twice a day, continue complains of frequent headaches, occasionally transient loss of consciousness Personally reviewed MRI of the brain in May 2022, mild atrophy, supratentorium small vessel disease, sagittal views showed upper vermis atrophy,  EEG was normal June 2022, but there was evidence of tachycardia, seeing cardiology.  Has seen cardiology, Dr. Bjorn Pippin 05/30/21, 7 day heart monitor showed no significant rhythm abnormalities, ECHO no significant abnormalities.  She continues to drink bourbons daily, much decreased dose compared to previously, but random alcohol level on July 14, 2021 was significantly elevated 0.107, considering with her low body weight, belong to legally intoxicated level  In addition, laboratory evaluation showed Topamax 16.3, continued abnormal liver functional test, elevated AST 166, ALT 81, electrolyte imbalance potassium 3.2, magnesium 1.5, B12 low normal at 249, decreased folic acid 3.3,   REVIEW OF SYSTEMS: Out of a complete 14 system review of symptoms, the patient complains only of the following symptoms, and all other reviewed systems are negative.  Seizures   ALLERGIES: Allergies  Allergen Reactions   Acetaminophen Other (See Comments)    Makes LFT get elevated  and causes liver problems/issues    Lithium Other (See Comments)    Causes the patient to have no appetite   Promethazine-Codeine Nausea And Vomiting and Other (See Comments)    Any codeine-based cough syrups cause N/V    Codeine Nausea And Vomiting   Morphine And Related Nausea And Vomiting    HOME MEDICATIONS: Outpatient Medications Prior to Visit  Medication Sig Dispense Refill   albuterol (PROVENTIL) (2.5 MG/3ML) 0.083% nebulizer solution Take 2.5 mg by nebulization every 6 (six) hours as needed for wheezing or shortness of breath.     albuterol (VENTOLIN HFA) 108 (90 Base) MCG/ACT inhaler Inhale 2 puffs into the lungs See admin instructions. Inhale 2 puffs into the lungs every 4-6 hours as needed for shortness of breath or wheezing 18 g 0   B Complex-C (SUPER B COMPLEX/VITAMIN C PO) Take 1 tablet by mouth daily. With Folic Acid     BIOTIN PO Take 1 tablet by mouth in the morning.     cetirizine (ZYRTEC) 10 MG tablet Take 10 mg by mouth daily.     clonazePAM (KLONOPIN) 0.5 MG tablet Take 1 tablet (0.5 mg total) by mouth 3 (three) times daily as needed for anxiety. 90 tablet 2   escitalopram (LEXAPRO) 20 MG tablet Take 1 tablet (20 mg total) by mouth daily. 90 tablet 3   LOTEMAX SM 0.38 % GEL Apply 1 drop to eye 4 (four) times daily.     magnesium gluconate (MAGONATE) 500 MG tablet Take 500 mg by mouth daily.     Multiple Vitamins-Minerals (MULTI ADULT GUMMIES) CHEW Chew 2 tablets by mouth in the morning.     naproxen sodium (ALEVE) 220 MG tablet Take 220 mg by mouth 2 (two) times daily as needed (for mild pain).     omeprazole (PRILOSEC) 20 MG capsule Take 1 capsule (20 mg total) by mouth 2 (two) times daily before a meal. 60 capsule 0   thiamine 100 MG tablet Take 1 tablet (100 mg total) by mouth daily. 30 tablet 0   topiramate (TOPAMAX) 100 MG tablet Take 1 tablet (100 mg total) by mouth 2 (two) times daily. 60 tablet 6   triamcinolone (NASACORT) 55 MCG/ACT nasal inhaler Place 2 sprays into the nose daily. For allergies 1 Inhaler 11   No facility-administered medications prior to visit.    PAST MEDICAL HISTORY: Past Medical History:  Diagnosis Date   Alcohol abuse    Allergy    Anemia    low iron    Anxiety    Asthma    Asthma    [employer] 11/09/2020   Depression    posibly bipolar disorder. 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   Hypertension    never on meds    Pneumonia    at 13 years   Seizure (HCC) 02/09/2021   Seizure (HCC)    Substance abuse (HCC)    [employer] 11/09/2020    PAST SURGICAL HISTORY: Past Surgical History:  Procedure Laterality Date   benign cyst     removed from left cheek   RADIOLOGY WITH ANESTHESIA N/A 04/15/2021   Procedure: MRI/CT OF ABDOMEN WITH ANESTHESIA;  Surgeon: Radiologist, Medication, MD;  Location: [facility] OR;  Service: Radiology;  Laterality: N/A;   TONSILECTOMY, ADENOIDECTOMY, BILATERAL MYRINGOTOMY AND TUBES      FAMILY HISTORY: Family  History  Problem Relation Age of Onset   Coronary artery disease Other        1st degree relative female <50   Depression Other        Family Hx   Diabetes Other        Family Hx 1st degree relative   Ovarian cancer Other        Family Hx    Healthy Mother    Diabetes Father    Gout Father    Hypertension Father    Heart failure Father    Cancer Father    Heart attack Father     SOCIAL HISTORY: Social History   Socioeconomic History   Marital status: Significant Other    Spouse name: Not on file   Number of children: 0   Years of education: college   Highest education level: Professional school degree (e.g., MD, DDS, DVM, JD)  Occupational History   Occupation: Occupational psychologist  Tobacco Use   Smoking status: Every Day    Packs/day: 0.25    Types: Cigarettes   Smokeless tobacco: Never  Vaping Use   Vaping Use: Former  Substance and Sexual Activity   Alcohol use: Yes    Comment: 1 glass of wine per day or less   Drug use: No   Sexual activity: Not on file  Other Topics Concern   Not on file  Social History Narrative   Lives with  significant other and his two children.   Right-handed.   No daily caffeine.   Social Determinants of Health   Financial Resource Strain: Not on file  Food Insecurity: Not on file  Transportation Needs: Not on file  Physical Activity: Not on file  Stress: Not on file  Social Connections: Not on file  Intimate Partner Violence: Not on file   PHYSICAL EXAM  Vitals:   01/08/22 1305  BP: 131/83  Pulse: (!) 121  Weight: 84 lb 8 oz (38.3 kg)  Height: 5' (1.524 m)   Body mass index is 16.5 kg/m.  Generalized: Thin appearing, frail  Neurological examination  Mentation: Alert oriented to time, place, history taking. Follows all commands speech and language fluent Cranial nerve II-XII: Pupils were equal round reactive to light. Extraocular movements were full, visual field were full on confrontational test. Facial sensation and strength were normal. Head turning and shoulder shrug  were normal and symmetric. Motor: Good strength to all extremities Sensory: Sensory testing is intact to soft touch on all 4 extremities. No evidence of extinction is noted.  Coordination: Truncal ataxia, mild finger-to-nose dysmetria Gait and station: Need push-up to get up from seated position, wide-based, unsteady, Reflexes: Deep tendon reflexes are symmetric and normal bilaterally.   DIAGNOSTIC DATA (LABS, IMAGING, TESTING) - I reviewed patient records, labs, notes, testing and imaging myself where available.  Lab Results  Component Value Date   WBC 4.6 12/04/2021   HGB 10.0 (L) 12/04/2021   HCT 30.5 (L) 12/04/2021   MCV 107.0 (H) 12/04/2021   PLT 132.0 (L) 12/04/2021      Component Value Date/Time   NA 142 12/04/2021 1054   NA 143 05/30/2021 1035   K 3.3 (L) 12/04/2021 1054   CL 99 12/04/2021 1054   CO2 27 12/04/2021 1054   GLUCOSE 105 (H) 12/04/2021 1054   BUN 11 12/04/2021 1054   BUN 10 05/30/2021 1035   CREATININE 0.46 12/04/2021 1054   CALCIUM 9.2 12/04/2021 1054   PROT 6.9  12/04/2021 1054   PROT 6.8  07/14/2021 1423   ALBUMIN 4.4 12/04/2021 1054   ALBUMIN 4.7 07/14/2021 1423   AST 158 (H) 12/04/2021 1054   ALT 31 12/04/2021 1054   ALKPHOS 197 (H) 12/04/2021 1054   BILITOT 0.7 12/04/2021 1054   BILITOT 0.5 07/14/2021 1423   GFRNONAA NOT CALCULATED 11/22/2021 1125   GFRAA 90 11/12/2020 1554   Lab Results  Component Value Date   CHOL 232 (H) 11/12/2020   HDL 88 11/12/2020   LDLCALC 131 (H) 11/12/2020   TRIG 77 11/12/2020   CHOLHDL 2.6 11/12/2020   Lab Results  Component Value Date   HGBA1C 4.9 11/12/2020   Lab Results  Component Value Date   VITAMINB12 249 12/04/2021   Lab Results  Component Value Date   TSH 2.550 05/30/2021   ASSESSMENT AND PLAN 56 y.o. 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 MRI, in the setting of poor nutrition, weight loss, electrolyte imbalance, long history of heavy alcohol abuse most consistent with Wernicke's encephalopathy, which could have irreversible gait abnormality  Emphasized importance of moderate exercise, increase water intake,  Topamax worsening for weight loss, discussed with patient, they desire medication for migraine prevention, which Topamax did help her headache, will switch to lamotrigine 100 twice a day,  She continue with her alcohol use, even though at decreased amount, but the random alcohol level was still within difficulty intoxicated level, emphasized again the importance of stop alcohol use, especially with her current antiepileptic medication use, can be a dangerous combination.   Laboratory evaluations,  Refer to physical therapy  Total time spent reviewing the chart, obtaining history, examined patient, ordering tests, documentation, consultations and family, care coordination was [id number]   Levert FeinsteinYijun Deann Mclaine, M.D. Ph.D.  [facility] [address] [place], [place] [address] Phone: [phone] Fax:      [phone]

## 2022-01-11 LAB — VITAMIN B1: Thiamine: 76.5 nmol/L (ref 66.5–200.0)

## 2022-01-11 LAB — ETHANOL: Ethanol: 0.089 %

## 2022-01-11 LAB — METHYLMALONIC ACID, SERUM: Methylmalonic Acid: 101 nmol/L (ref 0–378)

## 2022-01-11 LAB — CK: Total CK: 59 U/L (ref 32–182)

## 2022-01-11 LAB — VITAMIN B12: Vitamin B-12: 476 pg/mL (ref 232–1245)

## 2022-01-11 LAB — TSH: TSH: 1.86 u[IU]/mL (ref 0.450–4.500)

## 2022-01-11 LAB — HOMOCYSTEINE: Homocysteine: 52.4 umol/L — ABNORMAL HIGH (ref 0.0–14.5)

## 2022-01-11 LAB — FOLATE: Folate: 4.4 ng/mL (ref >3.0)

## 2022-01-19 ENCOUNTER — Ambulatory Visit: Payer: No Typology Code available for payment source | Admitting: Neurology

## 2022-02-11 ENCOUNTER — Ambulatory Visit: Payer: No Typology Code available for payment source | Admitting: Cardiology

## 2022-02-17 ENCOUNTER — Other Ambulatory Visit: Payer: Self-pay

## 2022-02-17 ENCOUNTER — Ambulatory Visit (INDEPENDENT_AMBULATORY_CARE_PROVIDER_SITE_OTHER): Payer: No Typology Code available for payment source | Admitting: Registered Nurse

## 2022-02-17 ENCOUNTER — Encounter: Payer: Self-pay | Admitting: Registered Nurse

## 2022-02-17 VITALS — BP 147/96 | HR 105 | Temp 98.0°F | Resp 17 | Ht 60.0 in | Wt 86.4 lb

## 2022-02-17 DIAGNOSIS — Z13228 Encounter for screening for other metabolic disorders: Secondary | ICD-10-CM

## 2022-02-17 DIAGNOSIS — S01502A Unspecified open wound of oral cavity, initial encounter: Secondary | ICD-10-CM

## 2022-02-17 DIAGNOSIS — R569 Unspecified convulsions: Secondary | ICD-10-CM | POA: Diagnosis not present

## 2022-02-17 DIAGNOSIS — Z0000 Encounter for general adult medical examination without abnormal findings: Secondary | ICD-10-CM | POA: Diagnosis not present

## 2022-02-17 DIAGNOSIS — Z1329 Encounter for screening for other suspected endocrine disorder: Secondary | ICD-10-CM | POA: Diagnosis not present

## 2022-02-17 DIAGNOSIS — Z13220 Encounter for screening for lipoid disorders: Secondary | ICD-10-CM

## 2022-02-17 DIAGNOSIS — F329 Major depressive disorder, single episode, unspecified: Secondary | ICD-10-CM | POA: Diagnosis not present

## 2022-02-17 DIAGNOSIS — R269 Unspecified abnormalities of gait and mobility: Secondary | ICD-10-CM | POA: Diagnosis not present

## 2022-02-17 DIAGNOSIS — W19XXXA Unspecified fall, initial encounter: Secondary | ICD-10-CM

## 2022-02-17 DIAGNOSIS — F1029 Alcohol dependence with unspecified alcohol-induced disorder: Secondary | ICD-10-CM

## 2022-02-17 DIAGNOSIS — Z130 Encounter for screening for diseases of the blood and blood-forming organs and certain disorders involving the immune mechanism: Secondary | ICD-10-CM

## 2022-02-17 LAB — CBC WITH DIFFERENTIAL/PLATELET
Basophils Absolute: 0.1 10*3/uL (ref 0.0–0.1)
Basophils Relative: 1.1 % (ref 0.0–3.0)
Eosinophils Absolute: 0.5 10*3/uL (ref 0.0–0.7)
Eosinophils Relative: 6.6 % — ABNORMAL HIGH (ref 0.0–5.0)
HCT: 30.9 % — ABNORMAL LOW (ref 36.0–46.0)
Hemoglobin: 10.3 g/dL — ABNORMAL LOW (ref 12.0–15.0)
Lymphocytes Relative: 5.8 % — ABNORMAL LOW (ref 12.0–46.0)
Lymphs Abs: 0.5 10*3/uL — ABNORMAL LOW (ref 0.7–4.0)
MCHC: 33.4 g/dL (ref 30.0–36.0)
MCV: 101.2 fl — ABNORMAL HIGH (ref 78.0–100.0)
Monocytes Absolute: 0.7 10*3/uL (ref 0.1–1.0)
Monocytes Relative: 9.4 % (ref 3.0–12.0)
Neutro Abs: 6.1 10*3/uL (ref 1.4–7.7)
Neutrophils Relative %: 77.1 % — ABNORMAL HIGH (ref 43.0–77.0)
Platelets: 308 10*3/uL (ref 150.0–400.0)
RBC: 3.05 Mil/uL — ABNORMAL LOW (ref 3.87–5.11)
RDW: 13.8 % (ref 11.5–15.5)
WBC: 7.9 10*3/uL (ref 4.0–10.5)

## 2022-02-17 LAB — COMPREHENSIVE METABOLIC PANEL
ALT: 48 U/L — ABNORMAL HIGH (ref 0–35)
AST: 79 U/L — ABNORMAL HIGH (ref 0–37)
Albumin: 3.8 g/dL (ref 3.5–5.2)
Alkaline Phosphatase: 238 U/L — ABNORMAL HIGH (ref 39–117)
BUN: 11 mg/dL (ref 6–23)
CO2: 31 mEq/L (ref 19–32)
Calcium: 9 mg/dL (ref 8.4–10.5)
Chloride: 90 mEq/L — ABNORMAL LOW (ref 96–112)
Creatinine, Ser: 0.51 mg/dL (ref 0.40–1.20)
GFR: 105.93 mL/min (ref >60.00)
Glucose, Bld: 92 mg/dL (ref 70–99)
Potassium: 3.4 mEq/L — ABNORMAL LOW (ref 3.5–5.1)
Sodium: 135 mEq/L (ref 135–145)
Total Bilirubin: 1.6 mg/dL — ABNORMAL HIGH (ref 0.2–1.2)
Total Protein: 5.9 g/dL — ABNORMAL LOW (ref 6.0–8.3)

## 2022-02-17 LAB — LIPID PANEL
Cholesterol: 167 mg/dL (ref 0–200)
HDL: 44.1 mg/dL (ref >39.00)
LDL Cholesterol: 105 mg/dL — ABNORMAL HIGH (ref 0–99)
NonHDL: 122.93
Total CHOL/HDL Ratio: 4
Triglycerides: 88 mg/dL (ref 0.0–149.0)
VLDL: 17.6 mg/dL (ref 0.0–40.0)

## 2022-02-17 LAB — B12 AND FOLATE PANEL
Folate: 14.1 ng/mL (ref >5.9)
Vitamin B-12: 843 pg/mL (ref 211–911)

## 2022-02-17 LAB — T4, FREE: Free T4: 1.46 ng/dL (ref 0.60–1.60)

## 2022-02-17 LAB — VITAMIN D 25 HYDROXY (VIT D DEFICIENCY, FRACTURES): VITD: 79.39 ng/mL (ref 30.00–100.00)

## 2022-02-17 LAB — HEMOGLOBIN A1C: Hgb A1c MFr Bld: 4.7 % (ref 4.6–6.5)

## 2022-02-17 LAB — TSH: TSH: 1.51 u[IU]/mL (ref 0.35–5.50)

## 2022-02-17 MED ORDER — AMOXICILLIN-POT CLAVULANATE 875-125 MG PO TABS: 1.0000 | ORAL_TABLET | Freq: Two times a day (BID) | ORAL | 0 refills | Status: DC

## 2022-02-17 NOTE — Progress Notes (Signed)
Established Patient Office Visit  Subjective:  Patient ID: Kelly Zuniga, female    DOB: October 16, 1966  Age: 56 y.o. MRN: [id number]  CC:  Chief Complaint  Patient presents with   Annual Exam    Patient states she is here for a CPE. Patient also wants to discuss more seizures     HPI Kelly Zuniga presents for CPE  Notes ongoing seizures. Continues to follow with Neuro Dr. Krista Blue.  Has been switched from topamax to lamictal due to weight loss  Her seizures continue. Dr. Krista Blue has suggested that Wernicke's encephalopathy is likely, which could be contributing to irreversible gait changes.  She notes two seizures in one day last Monday - 02/09/22.  She did fall with one of these and hit her chin. Bruising has improved but ongoing wound on lower lip. Looks infected. No dental changes. Has been using Orajel for pain with good effect.   She states that she has decreased alcohol intake, but she continues to have elevated ethanol readings on labs, most recently on 01/08/22 at 12:43 with Dr. Krista Blue at 0.089.   She does follow with behavioral health for poor mental health. Restarted on lexapro, doing well. Has follow up with this group in July.   Past Medical History:  Diagnosis Date   Alcohol abuse    Allergy    Anemia    low iron   Anxiety    Asthma    Asthma    [facility] 11/09/2020   Depression    posibly bipolar disorder. 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   Hypertension    never on meds    Pneumonia    at 13 years   Seizure ([place]) 02/09/2021   Seizure ([place])    Substance abuse ([place])    [facility] 11/09/2020    Past Surgical History:  Procedure Laterality Date   benign cyst     removed from left cheek   RADIOLOGY WITH ANESTHESIA N/A 04/15/2021   Procedure: MRI/CT OF ABDOMEN WITH ANESTHESIA;  Surgeon: Radiologist, Medication, MD;   Location: [facility];  Service: Radiology;  Laterality: N/A;   TONSILECTOMY, ADENOIDECTOMY, BILATERAL MYRINGOTOMY AND TUBES      Family History  Problem Relation Age of Onset   Coronary artery disease Other        1st degree relative female <50   Depression Other        Family Hx   Diabetes Other        Family Hx 1st degree relative   Ovarian cancer Other        Family Hx    Healthy Mother    Diabetes Father    Gout Father    Hypertension Father    Heart failure Father    Cancer Father    Heart attack Father     Social History   Socioeconomic History   Marital status: Significant Other    Spouse name: Not on file   Number of children: 0   Years of education: college   Highest education level: Professional school degree (e.g., MD, DDS, DVM, JD)  Occupational History   Occupation: Nutritional therapist  Tobacco Use   Smoking status: Every Day    Packs/day: 0.25    Types: Cigarettes   Smokeless tobacco: Never  Vaping Use   Vaping Use: Former  Substance and Sexual  Activity   Alcohol use: Yes    Comment: 1 glass of wine per day or less   Drug use: No   Sexual activity: Not on file  Other Topics Concern   Not on file  Social History Narrative   Lives with significant other and his two children.   Right-handed.   No daily caffeine.   Social Determinants of Health   Financial Resource Strain: Not on file  Food Insecurity: Not on file  Transportation Needs: Not on file  Physical Activity: Not on file  Stress: Not on file  Social Connections: Not on file  Intimate Partner Violence: Not on file    Outpatient Medications Prior to Visit  Medication Sig Dispense Refill   albuterol (PROVENTIL) (2.5 MG/3ML) 0.083% nebulizer solution Take 2.5 mg by nebulization every 6 (six) hours as needed for wheezing or shortness of breath.     albuterol (VENTOLIN HFA) 108 (90 Base) MCG/ACT inhaler Inhale 2 puffs into the lungs See admin instructions. Inhale 2 puffs into the lungs every 4-6 hours  as needed for shortness of breath or wheezing 18 g 0   B Complex-C (SUPER B COMPLEX/VITAMIN C PO) Take 1 tablet by mouth daily. With Folic Acid     BIOTIN PO Take 1 tablet by mouth in the morning.     cetirizine (ZYRTEC) 10 MG tablet Take 10 mg by mouth daily.     clonazePAM (KLONOPIN) 0.5 MG tablet Take 1 tablet (0.5 mg total) by mouth 3 (three) times daily as needed for anxiety. 90 tablet 2   escitalopram (LEXAPRO) 20 MG tablet Take 1 tablet (20 mg total) by mouth daily. 90 tablet 3   lamoTRIgine (LAMICTAL) 100 MG tablet Take 1 tablet (100 mg total) by mouth 2 (two) times daily. 60 tablet 11   lamoTRIgine (LAMICTAL) 25 MG tablet 1 tab bid x one week 2 tab bid x 2nd week 3 tab bid x 3rd week 84 tablet 0   LOTEMAX SM 0.38 % GEL Apply 1 drop to eye 4 (four) times daily.     magnesium gluconate (MAGONATE) 500 MG tablet Take 500 mg by mouth daily.     Multiple Vitamins-Minerals (MULTI ADULT GUMMIES) CHEW Chew 2 tablets by mouth in the morning.     naproxen sodium (ALEVE) 220 MG tablet Take 220 mg by mouth 2 (two) times daily as needed (for mild pain).     omeprazole (PRILOSEC) 20 MG capsule Take 1 capsule (20 mg total) by mouth 2 (two) times daily before a meal. 60 capsule 0   thiamine 100 MG tablet Take 1 tablet (100 mg total) by mouth daily. 30 tablet 0   thiamine 100 MG tablet Take 1 tablet (100 mg total) by mouth daily. 90 tablet 4   triamcinolone (NASACORT) 55 MCG/ACT nasal inhaler Place 2 sprays into the nose daily. For allergies 1 Inhaler 11   No facility-administered medications prior to visit.    Allergies  Allergen Reactions   Acetaminophen Other (See Comments)    Makes LFT get elevated and causes liver problems/issues    Lithium Other (See Comments)    Causes the patient to have no appetite   Promethazine-Codeine Nausea And Vomiting and Other (See Comments)    Any codeine-based cough syrups cause N/V   Codeine Nausea And Vomiting   Morphine And Related Nausea And Vomiting     ROS Review of Systems  Constitutional: Negative.   HENT: Negative.    Eyes: Negative.   Respiratory: Negative.  Cardiovascular: Negative.   Gastrointestinal: Negative.   Genitourinary: Negative.   Musculoskeletal: Negative.   Skin: Negative.   Neurological:  Positive for weakness.  Psychiatric/Behavioral: Negative.    All other systems reviewed and are negative.    Objective:    Physical Exam Vitals and nursing note reviewed.  Constitutional:      General: She is not in acute distress.    Appearance: Normal appearance. She is not ill-appearing, toxic-appearing or diaphoretic.     Comments: Pt is notably underweight.  HENT:     Head: Normocephalic and atraumatic.     Right Ear: Tympanic membrane, ear canal and external ear normal. There is no impacted cerumen.     Left Ear: Tympanic membrane, ear canal and external ear normal. There is no impacted cerumen.     Nose: Nose normal. No congestion or rhinorrhea.     Mouth/Throat:     Mouth: Mucous membranes are moist.     Pharynx: Oropharynx is clear. No oropharyngeal exudate or posterior oropharyngeal erythema.     Comments: Wound on lower lip. Open. Some sloughing. Eyes:     General: No scleral icterus.       Right eye: No discharge.        Left eye: No discharge.     Extraocular Movements: Extraocular movements intact.     Conjunctiva/sclera: Conjunctivae normal.     Pupils: Pupils are equal, round, and reactive to light.  Neck:     Vascular: No carotid bruit.  Cardiovascular:     Rate and Rhythm: Normal rate and regular rhythm.     Pulses: Normal pulses.     Heart sounds: Normal heart sounds. No murmur heard.   No friction rub. No gallop.  Pulmonary:     Effort: Pulmonary effort is normal. No respiratory distress.     Breath sounds: Normal breath sounds. No stridor. No wheezing, rhonchi or rales.  Chest:     Chest wall: No tenderness.  Abdominal:     General: Abdomen is flat. Bowel sounds are normal. There  is no distension.     Palpations: Abdomen is soft. There is no mass.     Tenderness: There is no abdominal tenderness. There is no right CVA tenderness, left CVA tenderness, guarding or rebound.     Hernia: No hernia is present.  Musculoskeletal:        General: No swelling, tenderness, deformity or signs of injury. Normal range of motion.     Cervical back: Normal range of motion and neck supple. No rigidity or tenderness.     Right lower leg: No edema.     Left lower leg: No edema.  Lymphadenopathy:     Cervical: No cervical adenopathy.  Skin:    General: Skin is warm and dry.     Capillary Refill: Capillary refill takes less than 2 seconds.     Coloration: Skin is not jaundiced or pale.     Findings: No bruising, erythema, lesion or rash.  Neurological:     General: No focal deficit present.     Mental Status: She is alert and oriented to person, place, and time. Mental status is at baseline.     Cranial Nerves: No cranial nerve deficit.     Sensory: No sensory deficit.     Motor: No weakness.     Coordination: Coordination normal.     Gait: Gait abnormal.     Deep Tendon Reflexes: Reflexes normal.  Psychiatric:  Mood and Affect: Mood normal.        Behavior: Behavior normal.        Thought Content: Thought content normal.        Judgment: Judgment normal.    BP (!) 147/96    Pulse (!) 105    Temp 98 F (36.7 C) (Temporal)    Resp 17    Ht 5' (1.524 m)    Wt 86 lb 6.4 oz (39.2 kg)    LMP 11/04/2013    SpO2 99%    BMI 16.87 kg/m  Wt Readings from Last 3 Encounters:  02/17/22 86 lb 6.4 oz (39.2 kg)  01/08/22 84 lb 8 oz (38.3 kg)  12/04/21 85 lb 6.4 oz (38.7 kg)     Health Maintenance Due  Topic Date Due   TETANUS/TDAP  Never done   PAP SMEAR-Modifier  02/11/2014   MAMMOGRAM  11/07/2017   COVID-19 Vaccine (3 - Booster for Pfizer series) 05/26/2020   Zoster Vaccines- Shingrix (2 of 2) 01/07/2021   INFLUENZA VACCINE  07/21/2021    There are no preventive care  reminders to display for this patient.  Lab Results  Component Value Date   TSH 1.860 01/08/2022   Lab Results  Component Value Date   WBC 4.6 12/04/2021   HGB 10.0 (L) 12/04/2021   HCT 30.5 (L) 12/04/2021   MCV 107.0 (H) 12/04/2021   PLT 132.0 (L) 12/04/2021   Lab Results  Component Value Date   NA 142 12/04/2021   K 3.3 (L) 12/04/2021   CO2 27 12/04/2021   GLUCOSE 105 (H) 12/04/2021   BUN 11 12/04/2021   CREATININE 0.46 12/04/2021   BILITOT 0.7 12/04/2021   ALKPHOS 197 (H) 12/04/2021   AST 158 (H) 12/04/2021   ALT 31 12/04/2021   PROT 6.9 12/04/2021   ALBUMIN 4.4 12/04/2021   CALCIUM 9.2 12/04/2021   ANIONGAP 16 (H) 11/22/2021   EGFR 100 05/30/2021   GFR 108.75 12/04/2021   Lab Results  Component Value Date   CHOL 232 (H) 11/12/2020   Lab Results  Component Value Date   HDL 88 11/12/2020   Lab Results  Component Value Date   LDLCALC 131 (H) 11/12/2020   Lab Results  Component Value Date   TRIG 77 11/12/2020   Lab Results  Component Value Date   CHOLHDL 2.6 11/12/2020   Lab Results  Component Value Date   HGBA1C 4.9 11/12/2020      Assessment & Plan:   Problem List Items Addressed This Visit       Other   MDD (major depressive disorder)   Relevant Orders   Comprehensive metabolic panel   CBC with Differential/Platelet   Hemoglobin A1c   TSH   Lipid panel   Vitamin D (25 hydroxy)   B12 and Folate Panel   T4, free   Alcohol dependence (HCC)   Relevant Orders   Comprehensive metabolic panel   CBC with Differential/Platelet   Hemoglobin A1c   TSH   Lipid panel   Vitamin D (25 hydroxy)   B12 and Folate Panel   T4, free   Fall due to seizure [facility])   Relevant Orders   Ambulatory referral to [place] metabolic panel   CBC with Differential/Platelet   Hemoglobin A1c   TSH   Lipid panel   Vitamin D (25 hydroxy)   B12 and Folate Panel   T4, free   Gait abnormality   Relevant Orders   Ambulatory  referral to Home  Health   Comprehensive metabolic panel   CBC with Differential/Platelet   Hemoglobin A1c   TSH   Lipid panel   Vitamin D (25 hydroxy)   B12 and Folate Panel   T4, free   Other Visit Diagnoses     Annual physical exam    -  Primary   Relevant Orders   Comprehensive metabolic panel   CBC with Differential/Platelet   Hemoglobin A1c   TSH   Lipid panel   Vitamin D (25 hydroxy)   B12 and Folate Panel   T4, free   Screening for endocrine, metabolic and immunity disorder       Relevant Orders   Comprehensive metabolic panel   CBC with Differential/Platelet   Hemoglobin A1c   TSH   Lipid panel   Vitamin D (25 hydroxy)   B12 and Folate Panel   T4, free   Lipid screening       Relevant Orders   Comprehensive metabolic panel   CBC with Differential/Platelet   Hemoglobin A1c   TSH   Lipid panel   Vitamin D (25 hydroxy)   B12 and Folate Panel   T4, free   Open wound of mouth, initial encounter       Relevant Medications   amoxicillin-clavulanate (AUGMENTIN) 875-125 MG tablet       Meds ordered this encounter  Medications   amoxicillin-clavulanate (AUGMENTIN) 875-125 MG tablet    Sig: Take 1 tablet by mouth 2 (two) times daily.    Dispense:  20 tablet    Refill:  0    Order Specific Question:   Supervising Provider    Answer:   Carlota Raspberry, JEFFREY R [[id number]]    Follow-up: Return in about 3 months (around 05/17/2022) for chronic conditions.   PLAN Exam notable for low weight, wound around mouth with question of infection. Will send augmentin for infection.  Otherwise exam notable for unsteady gait.  Otherwise unremarkable. Had a lengthy discussion with Ms. Stark about her alcohol use, her neuro outlook, and options for therapy. This conversation on pathophysiology and treatment options lasted for around 30 minutes. We will pursue home health. Ideally she would attend inpatient rehab for alcohol but she states this is not an option from a financial perspective.  Labs  collected. Will follow up with the patient as warranted. Patient encouraged to call clinic with any questions, comments, or concerns.  Maximiano Coss, NP

## 2022-02-17 NOTE — Patient Instructions (Addendum)
Ms. Hauswirth -   Randie Heinz to see you.  Continue to cut back on alcohol - it's imperative to any recovery that we have you cease alcohol consumption about as soon as you can manage.   Start on lamictal 25mg  twice daily. After one week, increase to 50mg  twice daily. After another week, increase to 75mg  twice daily. Then, after one last week, increase to 100mg  twice daily. If not tolerating, let Dr. know.   I will send antibiotics for your infected wound on your mouth.  I will let you know how labs look  Thank you,  Rich     If you have lab work done today you will be contacted with your lab results within the next 2 weeks.  If you have not heard from then please contact . The fastest way to get your results is to register for My Chart.   IF you received an x-ray today, you will receive an invoice from [facility]. Please contact [facility] at [phone] with questions or concerns regarding your invoice.   IF you received labwork today, you will receive an invoice from [facility]. Please contact [facility] at [phone] with questions or concerns regarding your invoice.   Our billing staff will not be able to assist you with questions regarding bills from these companies.  You will be contacted with the lab results as soon as they are available. The fastest way to get your results is to activate your My Chart account. Instructions are located on the last page of this paperwork. If you have not heard from [facility] regarding the results in 2 weeks, please contact this office.

## 2022-02-22 ENCOUNTER — Other Ambulatory Visit: Payer: Self-pay

## 2022-02-22 ENCOUNTER — Inpatient Hospital Stay (HOSPITAL_COMMUNITY)
Admission: EM | Admit: 2022-02-22 | Discharge: 2022-02-27 | DRG: 082 | Disposition: A | Discharge status: Hospice | Payer: No Typology Code available for payment source | Attending: Internal Medicine | Admitting: Internal Medicine

## 2022-02-22 ENCOUNTER — Emergency Department (HOSPITAL_COMMUNITY): Payer: No Typology Code available for payment source

## 2022-02-22 DIAGNOSIS — F32A Depression, unspecified: Secondary | ICD-10-CM | POA: Diagnosis present

## 2022-02-22 DIAGNOSIS — I6200 Nontraumatic subdural hemorrhage, unspecified: Principal | ICD-10-CM

## 2022-02-22 DIAGNOSIS — E512 Wernicke's encephalopathy: Secondary | ICD-10-CM | POA: Diagnosis present

## 2022-02-22 DIAGNOSIS — R296 Repeated falls: Secondary | ICD-10-CM | POA: Diagnosis present

## 2022-02-22 DIAGNOSIS — E876 Hypokalemia: Secondary | ICD-10-CM

## 2022-02-22 DIAGNOSIS — R0682 Tachypnea, not elsewhere classified: Secondary | ICD-10-CM

## 2022-02-22 DIAGNOSIS — G43709 Chronic migraine without aura, not intractable, without status migrainosus: Secondary | ICD-10-CM | POA: Diagnosis not present

## 2022-02-22 DIAGNOSIS — R402362 Coma scale, best motor response, obeys commands, at arrival to emergency department: Secondary | ICD-10-CM | POA: Diagnosis present

## 2022-02-22 DIAGNOSIS — Z20822 Contact with and (suspected) exposure to covid-19: Secondary | ICD-10-CM | POA: Diagnosis present

## 2022-02-22 DIAGNOSIS — R509 Fever, unspecified: Secondary | ICD-10-CM | POA: Diagnosis present

## 2022-02-22 DIAGNOSIS — Z8249 Family history of ischemic heart disease and other diseases of the circulatory system: Secondary | ICD-10-CM

## 2022-02-22 DIAGNOSIS — E871 Hypo-osmolality and hyponatremia: Secondary | ICD-10-CM

## 2022-02-22 DIAGNOSIS — E43 Unspecified severe protein-calorie malnutrition: Secondary | ICD-10-CM | POA: Diagnosis present

## 2022-02-22 DIAGNOSIS — R627 Adult failure to thrive: Secondary | ICD-10-CM

## 2022-02-22 DIAGNOSIS — I6201 Nontraumatic acute subdural hemorrhage: Secondary | ICD-10-CM | POA: Diagnosis present

## 2022-02-22 DIAGNOSIS — F0284 Dementia in other diseases classified elsewhere, unspecified severity, with anxiety: Secondary | ICD-10-CM | POA: Diagnosis present

## 2022-02-22 DIAGNOSIS — Z66 Do not resuscitate: Secondary | ICD-10-CM

## 2022-02-22 DIAGNOSIS — Z833 Family history of diabetes mellitus: Secondary | ICD-10-CM

## 2022-02-22 DIAGNOSIS — W06XXXA Fall from bed, initial encounter: Secondary | ICD-10-CM | POA: Diagnosis present

## 2022-02-22 DIAGNOSIS — Z79899 Other long term (current) drug therapy: Secondary | ICD-10-CM

## 2022-02-22 DIAGNOSIS — R402142 Coma scale, eyes open, spontaneous, at arrival to emergency department: Secondary | ICD-10-CM | POA: Diagnosis present

## 2022-02-22 DIAGNOSIS — F0283 Dementia in other diseases classified elsewhere, unspecified severity, with mood disturbance: Secondary | ICD-10-CM | POA: Diagnosis present

## 2022-02-22 DIAGNOSIS — F17210 Nicotine dependence, cigarettes, uncomplicated: Secondary | ICD-10-CM | POA: Diagnosis present

## 2022-02-22 DIAGNOSIS — R2689 Other abnormalities of gait and mobility: Secondary | ICD-10-CM | POA: Diagnosis present

## 2022-02-22 DIAGNOSIS — E860 Dehydration: Secondary | ICD-10-CM | POA: Diagnosis present

## 2022-02-22 DIAGNOSIS — Z8041 Family history of malignant neoplasm of ovary: Secondary | ICD-10-CM

## 2022-02-22 DIAGNOSIS — F1020 Alcohol dependence, uncomplicated: Secondary | ICD-10-CM | POA: Diagnosis present

## 2022-02-22 DIAGNOSIS — R7989 Other specified abnormal findings of blood chemistry: Secondary | ICD-10-CM | POA: Diagnosis not present

## 2022-02-22 DIAGNOSIS — Z885 Allergy status to narcotic agent status: Secondary | ICD-10-CM

## 2022-02-22 DIAGNOSIS — Z515 Encounter for palliative care: Secondary | ICD-10-CM

## 2022-02-22 DIAGNOSIS — F418 Other specified anxiety disorders: Secondary | ICD-10-CM

## 2022-02-22 DIAGNOSIS — I6203 Nontraumatic chronic subdural hemorrhage: Secondary | ICD-10-CM

## 2022-02-22 DIAGNOSIS — F411 Generalized anxiety disorder: Secondary | ICD-10-CM | POA: Diagnosis present

## 2022-02-22 DIAGNOSIS — Z681 Body mass index (BMI) 19 or less, adult: Secondary | ICD-10-CM

## 2022-02-22 DIAGNOSIS — R569 Unspecified convulsions: Secondary | ICD-10-CM

## 2022-02-22 DIAGNOSIS — Z888 Allergy status to other drugs, medicaments and biological substances status: Secondary | ICD-10-CM

## 2022-02-22 DIAGNOSIS — Z818 Family history of other mental and behavioral disorders: Secondary | ICD-10-CM

## 2022-02-22 DIAGNOSIS — S065XAA Traumatic subdural hemorrhage with loss of consciousness status unknown, initial encounter: Principal | ICD-10-CM | POA: Diagnosis present

## 2022-02-22 DIAGNOSIS — D539 Nutritional anemia, unspecified: Secondary | ICD-10-CM | POA: Diagnosis present

## 2022-02-22 DIAGNOSIS — Y92003 Bedroom of unspecified non-institutional (private) residence as the place of occurrence of the external cause: Secondary | ICD-10-CM

## 2022-02-22 DIAGNOSIS — I444 Left anterior fascicular block: Secondary | ICD-10-CM | POA: Diagnosis present

## 2022-02-22 DIAGNOSIS — B379 Candidiasis, unspecified: Secondary | ICD-10-CM | POA: Diagnosis present

## 2022-02-22 DIAGNOSIS — R638 Other symptoms and signs concerning food and fluid intake: Secondary | ICD-10-CM

## 2022-02-22 DIAGNOSIS — R402252 Coma scale, best verbal response, oriented, at arrival to emergency department: Secondary | ICD-10-CM | POA: Diagnosis present

## 2022-02-22 DIAGNOSIS — G9341 Metabolic encephalopathy: Secondary | ICD-10-CM | POA: Diagnosis present

## 2022-02-22 DIAGNOSIS — Z789 Other specified health status: Secondary | ICD-10-CM

## 2022-02-22 LAB — COMPREHENSIVE METABOLIC PANEL
ALT: 70 U/L — ABNORMAL HIGH (ref 0–44)
ALT: 66 U/L — ABNORMAL HIGH (ref 0–44)
AST: 167 U/L — ABNORMAL HIGH (ref 15–41)
AST: 157 U/L — ABNORMAL HIGH (ref 15–41)
Albumin: 2.8 g/dL — ABNORMAL LOW (ref 3.5–5.0)
Albumin: 2.3 g/dL — ABNORMAL LOW (ref 3.5–5.0)
Alkaline Phosphatase: 358 U/L — ABNORMAL HIGH (ref 38–126)
Alkaline Phosphatase: 327 U/L — ABNORMAL HIGH (ref 38–126)
Anion gap: 19 — ABNORMAL HIGH (ref 5–15)
Anion gap: 13 (ref 5–15)
BUN: 9 mg/dL (ref 6–20)
BUN: 7 mg/dL (ref 6–20)
CO2: 22 mmol/L (ref 22–32)
CO2: 23 mmol/L (ref 22–32)
Calcium: 7.6 mg/dL — ABNORMAL LOW (ref 8.9–10.3)
Calcium: 7.3 mg/dL — ABNORMAL LOW (ref 8.9–10.3)
Chloride: 88 mmol/L — ABNORMAL LOW (ref 98–111)
Chloride: 93 mmol/L — ABNORMAL LOW (ref 98–111)
Creatinine, Ser: 0.4 mg/dL — ABNORMAL LOW (ref 0.44–1.00)
Creatinine, Ser: 0.7 mg/dL (ref 0.44–1.00)
GFR, Estimated: >60 mL/min (ref >60)
GFR, Estimated: >60 mL/min (ref >60)
Glucose, Bld: 95 mg/dL (ref 70–99)
Glucose, Bld: 114 mg/dL — ABNORMAL HIGH (ref 70–99)
Potassium: 3 mmol/L — ABNORMAL LOW (ref 3.5–5.1)
Potassium: 3.1 mmol/L — ABNORMAL LOW (ref 3.5–5.1)
Sodium: 129 mmol/L — ABNORMAL LOW (ref 135–145)
Sodium: 129 mmol/L — ABNORMAL LOW (ref 135–145)
Total Bilirubin: 1.8 mg/dL — ABNORMAL HIGH (ref 0.3–1.2)
Total Bilirubin: 1.7 mg/dL — ABNORMAL HIGH (ref 0.3–1.2)
Total Protein: 5.1 g/dL — ABNORMAL LOW (ref 6.5–8.1)
Total Protein: 4.5 g/dL — ABNORMAL LOW (ref 6.5–8.1)

## 2022-02-22 LAB — CBC
HCT: 27.5 % — ABNORMAL LOW (ref 36.0–46.0)
Hemoglobin: 9.3 g/dL — ABNORMAL LOW (ref 12.0–15.0)
MCH: 34.2 pg — ABNORMAL HIGH (ref 26.0–34.0)
MCHC: 33.8 g/dL (ref 30.0–36.0)
MCV: 101.1 fL — ABNORMAL HIGH (ref 80.0–100.0)
Platelets: 256 10*3/uL (ref 150–400)
RBC: 2.72 MIL/uL — ABNORMAL LOW (ref 3.87–5.11)
RDW: 13.6 % (ref 11.5–15.5)
WBC: 9.9 10*3/uL (ref 4.0–10.5)
nRBC: 0 % (ref 0.0–0.2)

## 2022-02-22 LAB — CBC WITH DIFFERENTIAL/PLATELET
Abs Immature Granulocytes: 0.41 10*3/uL — ABNORMAL HIGH (ref 0.00–0.07)
Basophils Absolute: 0.1 10*3/uL (ref 0.0–0.1)
Basophils Relative: 1 %
Eosinophils Absolute: 0.3 10*3/uL (ref 0.0–0.5)
Eosinophils Relative: 3 %
HCT: 29.5 % — ABNORMAL LOW (ref 36.0–46.0)
Hemoglobin: 9.8 g/dL — ABNORMAL LOW (ref 12.0–15.0)
Immature Granulocytes: 4 %
Lymphocytes Relative: 5 %
Lymphs Abs: 0.6 10*3/uL — ABNORMAL LOW (ref 0.7–4.0)
MCH: 33.6 pg (ref 26.0–34.0)
MCHC: 33.2 g/dL (ref 30.0–36.0)
MCV: 101 fL — ABNORMAL HIGH (ref 80.0–100.0)
Monocytes Absolute: 1.2 10*3/uL — ABNORMAL HIGH (ref 0.1–1.0)
Monocytes Relative: 12 %
Neutro Abs: 7.9 10*3/uL — ABNORMAL HIGH (ref 1.7–7.7)
Neutrophils Relative %: 75 %
Platelets: 261 10*3/uL (ref 150–400)
RBC: 2.92 MIL/uL — ABNORMAL LOW (ref 3.87–5.11)
RDW: 13.6 % (ref 11.5–15.5)
WBC: 10.5 10*3/uL (ref 4.0–10.5)
nRBC: 0 % (ref 0.0–0.2)

## 2022-02-22 LAB — PROTIME-INR
INR: 1.2 (ref 0.8–1.2)
INR: 1.3 — ABNORMAL HIGH (ref 0.8–1.2)
Prothrombin Time: 15.3 seconds — ABNORMAL HIGH (ref 11.4–15.2)
Prothrombin Time: 16.2 seconds — ABNORMAL HIGH (ref 11.4–15.2)

## 2022-02-22 LAB — RESP PANEL BY RT-PCR (FLU A&B, COVID) ARPGX2
Influenza A by PCR: NEGATIVE
Influenza B by PCR: NEGATIVE
SARS Coronavirus 2 by RT PCR: NEGATIVE

## 2022-02-22 LAB — AMMONIA: Ammonia: 41 umol/L — ABNORMAL HIGH (ref 9–35)

## 2022-02-22 LAB — TROPONIN I (HIGH SENSITIVITY): Troponin I (High Sensitivity): 21 ng/L — ABNORMAL HIGH (ref <18)

## 2022-02-22 LAB — MAGNESIUM
Magnesium: 1.3 mg/dL — ABNORMAL LOW (ref 1.7–2.4)
Magnesium: 2 mg/dL (ref 1.7–2.4)

## 2022-02-22 LAB — ETHANOL: Alcohol, Ethyl (B): <10 mg/dL (ref <10)

## 2022-02-22 LAB — LIPASE, BLOOD: Lipase: 19 U/L (ref 11–51)

## 2022-02-22 MED ORDER — KCL IN DEXTROSE-NACL 20-5-0.45 MEQ/L-%-% IV SOLN
INTRAVENOUS | Status: DC
  Filled 2022-02-22: qty 1000

## 2022-02-22 MED ORDER — ADULT MULTIVITAMIN W/MINERALS CH
1.0000 | ORAL_TABLET | Freq: Every day | ORAL | Status: DC
  Administered 2022-02-22 – 2022-02-25 (×4): 1 via ORAL
  Filled 2022-02-22 (×4): qty 1

## 2022-02-22 MED ORDER — POTASSIUM CHLORIDE 10 MEQ/100ML IV SOLN
10.0000 meq | INTRAVENOUS | Status: AC
  Administered 2022-02-22 – 2022-02-23 (×2): 10 meq via INTRAVENOUS
  Filled 2022-02-22 (×2): qty 100

## 2022-02-22 MED ORDER — LORAZEPAM 1 MG PO TABS: 0.0000 mg | ORAL_TABLET | Freq: Two times a day (BID) | ORAL | Status: DC

## 2022-02-22 MED ORDER — LIP MEDEX EX OINT
1.0000 "application " | TOPICAL_OINTMENT | CUTANEOUS | Status: DC | PRN
  Administered 2022-02-22: 1 via TOPICAL
  Filled 2022-02-22: qty 7

## 2022-02-22 MED ORDER — ONDANSETRON HCL 4 MG PO TABS: 4.0000 mg | ORAL_TABLET | Freq: Four times a day (QID) | ORAL | Status: DC | PRN

## 2022-02-22 MED ORDER — SENNOSIDES-DOCUSATE SODIUM 8.6-50 MG PO TABS: 1.0000 | ORAL_TABLET | Freq: Every evening | ORAL | Status: DC | PRN

## 2022-02-22 MED ORDER — ONDANSETRON HCL 4 MG/2ML IJ SOLN
4.0000 mg | Freq: Once | INTRAMUSCULAR | Status: AC
Start: 2022-02-22 — End: 2022-02-22
  Administered 2022-02-22: 4 mg via INTRAVENOUS
  Filled 2022-02-22: qty 2

## 2022-02-22 MED ORDER — LORAZEPAM 2 MG/ML IJ SOLN
0.5000 mg | Freq: Once | INTRAMUSCULAR | Status: AC
  Administered 2022-02-22: 0.5 mg via INTRAVENOUS
  Filled 2022-02-22: qty 1

## 2022-02-22 MED ORDER — SODIUM CHLORIDE 0.9% FLUSH
3.0000 mL | Freq: Two times a day (BID) | INTRAVENOUS | Status: DC
  Administered 2022-02-22 – 2022-02-27 (×8): 3 mL via INTRAVENOUS

## 2022-02-22 MED ORDER — THIAMINE HCL 100 MG/ML IJ SOLN: 100.0000 mg | Freq: Every day | INTRAMUSCULAR | Status: DC

## 2022-02-22 MED ORDER — LORAZEPAM 1 MG PO TABS: 0.0000 mg | ORAL_TABLET | Freq: Four times a day (QID) | ORAL | Status: DC

## 2022-02-22 MED ORDER — LAMOTRIGINE 100 MG PO TABS
100.0000 mg | ORAL_TABLET | Freq: Two times a day (BID) | ORAL | Status: DC
  Administered 2022-02-22 – 2022-02-25 (×6): 100 mg via ORAL
  Filled 2022-02-22 (×6): qty 1

## 2022-02-22 MED ORDER — MAGIC MOUTHWASH W/LIDOCAINE
5.0000 mL | Freq: Three times a day (TID) | ORAL | Status: DC | PRN
  Administered 2022-02-22: 5 mL via ORAL
  Filled 2022-02-22 (×2): qty 5

## 2022-02-22 MED ORDER — POTASSIUM CHLORIDE IN NACL 40-0.9 MEQ/L-% IV SOLN
INTRAVENOUS | Status: DC
  Filled 2022-02-22 (×2): qty 1000

## 2022-02-22 MED ORDER — FOLIC ACID 1 MG PO TABS
1.0000 mg | ORAL_TABLET | Freq: Every day | ORAL | Status: DC
  Administered 2022-02-22 – 2022-02-25 (×4): 1 mg via ORAL
  Filled 2022-02-22 (×4): qty 1

## 2022-02-22 MED ORDER — ONDANSETRON HCL 4 MG/2ML IJ SOLN
4.0000 mg | Freq: Four times a day (QID) | INTRAMUSCULAR | Status: DC | PRN
  Administered 2022-02-22 – 2022-02-25 (×2): 4 mg via INTRAVENOUS
  Filled 2022-02-22 (×2): qty 2

## 2022-02-22 MED ORDER — LORAZEPAM 1 MG PO TABS: 1.0000 mg | ORAL_TABLET | ORAL | Status: AC | PRN

## 2022-02-22 MED ORDER — LORAZEPAM 2 MG/ML IJ SOLN: 0.0000 mg | Freq: Four times a day (QID) | INTRAMUSCULAR | Status: DC

## 2022-02-22 MED ORDER — ALBUTEROL SULFATE (2.5 MG/3ML) 0.083% IN NEBU
2.5000 mg | INHALATION_SOLUTION | Freq: Four times a day (QID) | RESPIRATORY_TRACT | Status: DC | PRN
  Administered 2022-02-25: 2.5 mg via RESPIRATORY_TRACT
  Filled 2022-02-22: qty 3

## 2022-02-22 MED ORDER — LORAZEPAM 2 MG/ML IJ SOLN: 0.0000 mg | Freq: Two times a day (BID) | INTRAMUSCULAR | Status: DC

## 2022-02-22 MED ORDER — MAGNESIUM GLUCONATE 500 MG PO TABS
500.0000 mg | ORAL_TABLET | Freq: Every day | ORAL | Status: DC
  Administered 2022-02-23 – 2022-02-25 (×3): 500 mg via ORAL
  Filled 2022-02-22 (×3): qty 1

## 2022-02-22 MED ORDER — SODIUM CHLORIDE 0.9 % IV BOLUS
1000.0000 mL | Freq: Once | INTRAVENOUS | Status: AC
  Administered 2022-02-22: 1000 mL via INTRAVENOUS

## 2022-02-22 MED ORDER — KCL IN DEXTROSE-NACL 20-5-0.45 MEQ/L-%-% IV SOLN: Freq: Once | INTRAVENOUS | Status: DC

## 2022-02-22 MED ORDER — LORAZEPAM 2 MG/ML IJ SOLN: 1.0000 mg | INTRAMUSCULAR | Status: AC | PRN

## 2022-02-22 MED ORDER — MAGNESIUM SULFATE 2 GM/50ML IV SOLN
2.0000 g | Freq: Once | INTRAVENOUS | Status: AC
  Administered 2022-02-22: 2 g via INTRAVENOUS
  Filled 2022-02-22: qty 50

## 2022-02-22 MED ORDER — THIAMINE HCL 100 MG PO TABS
100.0000 mg | ORAL_TABLET | Freq: Every day | ORAL | Status: DC
  Administered 2022-02-22 – 2022-02-25 (×4): 100 mg via ORAL
  Filled 2022-02-22 (×4): qty 1

## 2022-02-22 MED ORDER — DEXTROSE-NACL 5-0.45 % IV SOLN: INTRAVENOUS | Status: DC

## 2022-02-22 NOTE — ED Notes (Signed)
Floor notified of patient's departure from ED ?

## 2022-02-22 NOTE — Assessment & Plan Note (Addendum)
comfort care. ?

## 2022-02-22 NOTE — ED Triage Notes (Addendum)
Patient here with c/o fall today out of bed and generalized weakness. Patient report that she hit her head on the floor. She is not on blood thinners. ?

## 2022-02-22 NOTE — Assessment & Plan Note (Addendum)
CT head shows increased size of right cerebral hemisphere subdural hematoma.  New small volume of acute hemorrhage changes seen within this collection and overlying the anterior lateral aspect of the right temporal lobe.  May be related to reported trauma to head after fall at home.  She is not on any blood thinners. ?-transition to comfort care ?

## 2022-02-22 NOTE — Hospital Course (Addendum)
Kelly Zuniga is a 56 y.o. female with medical history significant for chronic alcohol use with suspected Warnicke's encephalopathy associated with gait abnormality, chronic migraines and seizure-like activity on lamotrigine, macrocytic anemia, depression/anxiety, and chronic deconditioning with failure to thrive who is admitted with acute on chronic subdural hematoma in setting of seizure-like activity with presumed head injury.  Poor overall prognosis and mom would like to focus on comfort.  Transitioned to comfort care on 3/8.  Await [facility]. ?

## 2022-02-22 NOTE — Progress Notes (Addendum)
Patient arrived to [facility] around 1030 pm via EMS. She is not responding to questions and is very lethargic. We removed her pants, wiped her down with CHG wipes and changed her gown and brief. She also said she couldn't take her pills because she's "not feeling well". I gave her zofran to help her nausea. There is also an order to page the doctor for today and they are both off line. I will try amion and page someone.  ?

## 2022-02-22 NOTE — Assessment & Plan Note (Addendum)
Continue Lamictal as able ?

## 2022-02-22 NOTE — ED Notes (Addendum)
Report given to [facility]. ETA 15 minutes ? ?

## 2022-02-22 NOTE — Assessment & Plan Note (Addendum)
Patient with chronic daily alcohol use, likely 4-5 drinks bourbon daily per family.  Last drink reportedly night of 3/4.  Has history of withdrawal.  Has suspected Warnicke's encephalopathy per outpatient neurology evaluation. ?-transition to comfort care ?

## 2022-02-22 NOTE — ED Provider Notes (Signed)
[facility]-EMERGENCY DEPT Provider Note   CSN: [id number] Arrival date & time: 02/22/22  1703     History  CC: Seizure, alcohol use   Kelly Zuniga is a 56 y.o. female with a history of chronic alcohol use, suspected Warnicke's encephalopathy, chronic deconditioning, failure to thrive, seizures on Lamictal, presenting to the ED with generalized seizure and weakness.  The patient feels she may have had a seizure last night, though she cannot recall the events around it, but says she woke up in her bed feeling as if she had had a seizure.  She reports that she has been recently scaling back on her drinking as per instructions from her primary care doctor, has scaled back from about 4 glasses of bourbon daily to 1 glass daily (likely several shots of bourbon per mother's report in the glass).  Her last drink was this morning.  She reports compliance of all her medications.  She reports continued difficulty with ambulation and gait problems when walking.  HPI     Home Medications Prior to Admission medications   Medication Sig Start Date End Date Taking? Authorizing Provider  albuterol (PROVENTIL) (2.5 MG/3ML) 0.083% nebulizer solution Take 2.5 mg by nebulization every 6 (six) hours as needed for wheezing or shortness of breath. 09/13/15   [provider]  albuterol (VENTOLIN HFA) 108 (90 Base) MCG/ACT inhaler Inhale 2 puffs into the lungs See admin instructions. Inhale 2 puffs into the lungs every 4-6 hours as needed for shortness of breath or wheezing 08/19/21   Shade FloodGreene, Jeffrey R, MD  amoxicillin-clavulanate (AUGMENTIN) 875-125 MG tablet Take 1 tablet by mouth 2 (two) times daily. 02/17/22   Janeece AgeeMorrow, Richard, NP  B Complex-C (SUPER B COMPLEX/VITAMIN C PO) Take 1 tablet by mouth daily. With Folic Acid    [provider]  BIOTIN PO Take 1 tablet by mouth in the morning.    [provider]  cetirizine (ZYRTEC) 10 MG tablet Take 10 mg by mouth  daily.    [provider]  clonazePAM (KLONOPIN) 0.5 MG tablet Take 1 tablet (0.5 mg total) by mouth 3 (three) times daily as needed for anxiety. 11/11/20   Mozingo, Thereasa Soloegina Nattalie, NP  escitalopram (LEXAPRO) 20 MG tablet Take 1 tablet (20 mg total) by mouth daily. 01/07/22   Mozingo, Thereasa Soloegina Nattalie, NP  lamoTRIgine (LAMICTAL) 100 MG tablet Take 1 tablet (100 mg total) by mouth 2 (two) times daily. 01/08/22   Levert FeinsteinYan, Yijun, MD  lamoTRIgine (LAMICTAL) 25 MG tablet 1 tab bid x one week 2 tab bid x 2nd week 3 tab bid x 3rd week 01/08/22   Levert FeinsteinYan, Yijun, MD  LOTEMAX SM 0.38 % GEL Apply 1 drop to eye 4 (four) times daily. 07/28/21   [provider]  magnesium gluconate (MAGONATE) 500 MG tablet Take 500 mg by mouth daily.    [provider]  Multiple Vitamins-Minerals (MULTI ADULT GUMMIES) CHEW Chew 2 tablets by mouth in the morning.    [provider]  naproxen sodium (ALEVE) 220 MG tablet Take 220 mg by mouth 2 (two) times daily as needed (for mild pain).    [provider]  omeprazole (PRILOSEC) 20 MG capsule Take 1 capsule (20 mg total) by mouth 2 (two) times daily before a meal. 11/22/21   Derwood KaplanNanavati, Ankit, MD  thiamine 100 MG tablet Take 1 tablet (100 mg total) by mouth daily. 02/13/21   Joseph ArtVann, Jessica U, DO  thiamine 100 MG tablet Take 1 tablet (100 mg  total) by mouth daily. 01/08/22   Levert Feinstein, MD  triamcinolone (NASACORT) 55 MCG/ACT nasal inhaler Place 2 sprays into the nose daily. For allergies 02/23/13   Armandina Stammer I, NP      Allergies    Acetaminophen, Lithium, Promethazine-codeine, Codeine, and Morphine and related    Review of Systems   Review of Systems  Physical Exam Updated Vital Signs BP 119/67    Pulse 84    Temp (!) 97.5 F (36.4 C) (Oral)    Resp 14    Ht 5' (1.524 m)    Wt 38.6 kg    LMP 11/04/2013    SpO2 91%    BMI 16.60 kg/m  Physical Exam Constitutional:      Comments: Thin, frail, chronically ill-appearing  HENT:     Head:  Normocephalic and atraumatic.  Eyes:     Conjunctiva/sclera: Conjunctivae normal.     Pupils: Pupils are equal, round, and reactive to light.  Cardiovascular:     Rate and Rhythm: Normal rate and regular rhythm.  Pulmonary:     Effort: Pulmonary effort is normal. No respiratory distress.  Abdominal:     General: There is no distension.     Tenderness: There is no abdominal tenderness.  Skin:    General: Skin is warm and dry.  Neurological:     General: No focal deficit present.     Mental Status: She is alert and oriented to person, place, and time. Mental status is at baseline.    ED Results / Procedures / Treatments   Labs (all labs ordered are listed, but only abnormal results are displayed) Labs Reviewed  COMPREHENSIVE METABOLIC PANEL - Abnormal; Notable for the following components:      Result Value   Sodium 129 (*)    Potassium 3.0 (*)    Chloride 88 (*)    Creatinine, Ser 0.40 (*)    Calcium 7.6 (*)    Total Protein 5.1 (*)    Albumin 2.8 (*)    AST 167 (*)    ALT 70 (*)    Alkaline Phosphatase 358 (*)    Total Bilirubin 1.8 (*)    Anion gap 19 (*)    All other components within normal limits  CBC WITH DIFFERENTIAL/PLATELET - Abnormal; Notable for the following components:   RBC 2.92 (*)    Hemoglobin 9.8 (*)    HCT 29.5 (*)    MCV 101.0 (*)    Neutro Abs 7.9 (*)    Lymphs Abs 0.6 (*)    Monocytes Absolute 1.2 (*)    Abs Immature Granulocytes 0.41 (*)    All other components within normal limits  MAGNESIUM - Abnormal; Notable for the following components:   Magnesium 1.3 (*)    All other components within normal limits  AMMONIA - Abnormal; Notable for the following components:   Ammonia 41 (*)    All other components within normal limits  TROPONIN I (HIGH SENSITIVITY) - Abnormal; Notable for the following components:   Troponin I (High Sensitivity) 21 (*)    All other components within normal limits  RESP PANEL BY RT-PCR (FLU A&B, COVID) [id number]   LIPASE, BLOOD  ETHANOL    EKG EKG Interpretation  Date/Time:  Sunday February 22 2022 17:41:26 EST Ventricular Rate:  85 PR Interval:  122 QRS Duration: 96 QT Interval:  414 QTC Calculation: 493 R Axis:   -61 Text Interpretation: Sinus rhythm Left anterior fascicular block Anterior infarct, old Confirmed by Alvester Chou (  [id number]) on 02/22/2022 5:56:03 PM  Radiology CT Head Wo Contrast  Result Date: 02/22/2022 CLINICAL DATA:  Mental status change, unknown cause, unclear if head injury. Fall today. Generalized weakness. EXAM: CT HEAD WITHOUT CONTRAST TECHNIQUE: Contiguous axial images were obtained from the base of the skull through the vertex without intravenous contrast. RADIATION DOSE REDUCTION: This exam was performed according to the departmental dose-optimization program which includes automated exposure control, adjustment of the mA and/or kV according to patient size and/or use of iterative reconstruction technique. COMPARISON:  Prior head CT examinations 11/22/2021 and earlier. FINDINGS: Brain: Mild generalized cerebral and cerebellar atrophy. A subdural collection overlying the right cerebral hemisphere has increased in size as compared to the head CT of 11/22/2021 (now measuring up to 12 mm in greatest thickness). This is compatible with a hematoma. The majority of the collection is low to intermediate density. However, new from the prior head CT, there is small volume acute hemorrhage within the collection (for instance as seen on series 2, image 19). Mild mass effect upon the underlying right cerebral hemisphere without midline shift. Additionally, there is small volume acute hemorrhage overlying the anterolateral aspect of the right temporal lobe, which may be subdural or subarachnoid (series 2, image 7). Mild patchy and ill-defined hypoattenuation within the cerebral white matter, nonspecific but compatible chronic small vessel ischemic disease No demarcated cortical infarct. No evidence  of an intracranial mass. Vascular: No hyperdense vessel.  Atherosclerotic calcifications. Skull: Normal. Negative for fracture or focal lesion. Sinuses/Orbits: Visualized orbits show no acute finding. Mild mucosal thickening within the left maxillary sinus at the imaged levels. These results were called by telephone at the time of interpretation on 02/22/2022 at 6:35 pm to provider Lorella Gomez , who verbally acknowledged these results. IMPRESSION: A subdural collection overlying the right cerebral hemisphere has increased in size since the prior head CT of 11/22/2021 (now measuring up to 12 mm in greatest thickness). This is compatible with a subdural hematoma. The majority of the collection is low-to-intermediate density. However, new from the prior head CT, there is small-volume hyperdense acute hemorrhage within portions of the collection. Mild mass effect upon the underlying right cerebral hemisphere without midline shift. Additionally, there is small-volume acute hemorrhage overlying the anterolateral aspect of the right temporal lobe, which may be subdural or subarachnoid. Mild chronic small vessel ischemic changes within the cerebral white matter. Mild general parenchymal atrophy. Mild mucosal thickening within the left maxillary sinus at the imaged levels. Electronically Signed   By: Jackey Loge D.O.   On: 02/22/2022 18:39    Procedures .Critical Care Performed by: Terald Sleeper, MD Authorized by: Terald Sleeper, MD   Critical care provider statement:    Critical care time (minutes):  45   Critical care time was exclusive of:  Separately billable procedures and treating other patients   Critical care was time spent personally by me on the following activities:  Ordering and performing treatments and interventions, ordering and review of laboratory studies, ordering and review of radiographic studies, pulse oximetry, review of old charts, examination of patient and evaluation of patient's  response to treatment   Care discussed with: admitting provider   Comments:     Electrolyte derangement management, neurological reassessments after seizure, consultation with specialist    Medications Ordered in ED Medications  magnesium sulfate IVPB 2 g 50 mL (2 g Intravenous New Bag/Given 02/22/22 1937)  dextrose 5 % and 0.45 % NaCl with KCl 20 mEq/L infusion ( Intravenous New  Bag/Given 02/22/22 1938)  magic mouthwash w/lidocaine (has no administration in time range)  LORazepam (ATIVAN) injection 0-4 mg (0 mg Intravenous Not Given 02/22/22 1947)    Or  LORazepam (ATIVAN) tablet 0-4 mg ( Oral See Alternative 02/22/22 1947)  LORazepam (ATIVAN) injection 0-4 mg (has no administration in time range)    Or  LORazepam (ATIVAN) tablet 0-4 mg (has no administration in time range)  thiamine tablet 100 mg (has no administration in time range)    Or  thiamine (B-1) injection 100 mg (has no administration in time range)  sodium chloride 0.9 % bolus 1,000 mL (0 mLs Intravenous Stopped 02/22/22 1907)  LORazepam (ATIVAN) injection 0.5 mg (0.5 mg Intravenous Given 02/22/22 1938)  ondansetron (ZOFRAN) injection 4 mg (4 mg Intravenous Given 02/22/22 1948)    ED Course/ Medical Decision Making/ A&P Clinical Course as of 02/22/22 1950  Sun Feb 22, 2022  1857 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  Questionable 2nd focus of bleeding may raise possibility of additional ICH.  Page out to neurosurgery. [MT]  1948 Pt admitted to Dr Allena Katz hospitalist. [MT]    Clinical Course User Index [MT] Renaye Rakers Kermit Balo, MD                           Medical Decision Making Amount and/or Complexity of Data Reviewed Labs: ordered. Radiology: ordered. ECG/medicine tests: ordered.  Risk OTC drugs. Prescription drug management. Decision regarding hospitalization.   Patient is here with a seizure, which may be related to alcohol withdrawal vs electrolyte derangement vs breakthrough  seizure  She is fully oriented on arrival.  Vitals are within normal limits.  She looks chronically thin, malnourished, deconditioned.  I personally reviewed her external records including her neurology outpatient evaluation and PCP evaluations, which expressed concern for likely Warnicke's encephalopathy.  She was most recently seen by a neurologist in January 2023, who notes "She continue to have significant gait abnormality, truncal ataxia, mild limb dysmetria, evidence of superior vermis atrophy on brain MRI, in the setting of poor nutrition, weight loss, electrolyte imbalance, long history of heavy alcohol abuse most consistent with Wernicke's encephalopathy, which could have irreversible gait abnormality" (Dr Levert Feinstein)  She was switch from Topamax to Lamictal on that office visit due to concerns with weight loss.  Her mother is present at bedside to provide supplemental history, including confirming the patient's continued alcohol use.  I personally ordered, reviewed and interpreted patient's EKG, labs and imaging, which were notable for hypokalemia, hypomagnesemia, chronic liver enzyme elevation consistent with ongoing etoh use, negative etoh level, ammonia 41, trop 21.  Chronic macrocytic anemia.  CTH reviewed as noted in ED course, concerning for acute on chronic subdural bleeding.  Patient AAO x 4 in Ed, on reassessments mental status is stable and no seizure activity here.  IV fluid bolus given, and then d5 1/2 saline infusion w/ KCL as a continuous 12 hour infusion.  IV magnesium ordered as well for repletion.  Neurosurgery consulted by phone, Dr Danielle Dess, recommending admission and transfer to [facility], will evaluate patient in the AM, patient does not need repeat CT imaging overnight unless clinical presentation deteriorates.  Dr Danielle Dess is comfortable continuing lamictal as AED.  Hospitalist paged for admission  Patient and mother updated at bedside.  Magic mouthwash ordered  for oral lesions (bites from seizure), and CIWA protocol started (currently asymptomatic but pt reports withdrawal symptoms usually start  within 24 hours of last drink).  IV ativan and zofran ordered for nausea.          Final Clinical Impression(s) / ED Diagnoses Final diagnoses:  Subdural bleeding (HCC)  Seizure (HCC)  Hypomagnesemia  Hypokalemia    Rx / DC Orders ED Discharge Orders     None         Sayward Horvath, Kermit BaloMatthew J, MD 02/22/22 1950

## 2022-02-22 NOTE — Assessment & Plan Note (Signed)
Continue Lamictal 

## 2022-02-22 NOTE — ED Notes (Signed)
ED TO INPATIENT HANDOFF REPORT  ED Nurse Name and Phone #: Huntley Dec [phone]  S Name/Age/Gender Kelly Zuniga 56 y.o. female Room/Bed: WA14/WA14  Code Status   Code Status: Prior  Home/SNF/Other Home Patient oriented to: self; place; time; situation Is this baseline? Yes   Triage Complete: Triage complete  Chief Complaint Acute on chronic intracranial subdural hematoma (HCC) [I62.01, I62.03]  Triage Note Patient here with c/o fall today out of bed and generalized weakness. Patient report that she hit her head on the floor. She is not on blood thinners.   Allergies Allergies  Allergen Reactions   Acetaminophen Other (See Comments)    Makes LFT get elevated and causes liver problems/issues    Lithium Other (See Comments)    Causes the patient to have no appetite   Promethazine-Codeine Nausea And Vomiting and Other (See Comments)    Any codeine-based cough syrups cause N/V   Codeine Nausea And Vomiting   Morphine And Related Nausea And Vomiting    Level of Care/Admitting Diagnosis ED Disposition     ED Disposition  Admit   Condition  --   Comment  Hospital Area: [facility] [[id number]]  Level of Care: Telemetry Medical [[id number]]  May admit patient to [facility] or [facility] if equivalent level of care is available:: No  Covid Evaluation: Confirmed COVID Negative  Diagnosis: Acute on chronic intracranial subdural hematoma [facility]) [[id number]]  Admitting Physician: Charlsie Quest [[id number]]  Attending Physician: Charlsie Quest [[id number]]  Estimated length of stay: past midnight tomorrow  Certification:: I certify this patient will need inpatient services for at least 2 midnights          B Medical/Surgery History Past Medical History:  Diagnosis Date   Alcohol abuse    Allergy    Anemia    low iron   Anxiety    Asthma    Asthma    Phreesia 11/09/2020   Depression    posibly bipolar disorder. 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   Hypertension    never on meds    Pneumonia    at 13 years   Seizure (HCC) 02/09/2021   Seizure (HCC)    Substance abuse (HCC)    [employer]reesia 11/09/2020   Past Surgical History:  Procedure Laterality Date   benign cyst     removed from left cheek   RADIOLOGY WITH ANESTHESIA N/A 04/15/2021   Procedure: MRI/CT OF ABDOMEN WITH ANESTHESIA;  Surgeon: Radiologist, Medication, MD;  Location: [facility] OR;  Service: Radiology;  Laterality: N/A;   TONSILECTOMY, ADENOIDECTOMY, BILATERAL MYRINGOTOMY AND TUBES       A IV Location/Drains/Wounds Patient Lines/Drains/Airways Status     Active Line/Drains/Airways     Name Placement date Placement time Site Days   Peripheral IV 02/22/22 Anterior;Left Forearm 02/22/22  --  Forearm  less than 1            Intake/Output Last 24 hours  Intake/Output Summary (Last 24 hours) at 02/22/2022 2006 Last data filed at 02/22/2022 1907 Gross per 24 hour  Intake 1000 ml  Output --  Net 1000 ml    Labs/Imaging Results for orders placed or performed during the hospital encounter of 02/22/22 (from the past 48 hour(s))  Comprehensive metabolic panel     Status: Abnormal   Collection Time: 02/22/22  6:00 PM  Result Value Ref Range   Sodium 129 (L) 135 - 145 mmol/L   Potassium 3.0 (L) 3.5 - 5.1 mmol/L   Chloride 88 (L) 98 - 111 mmol/L   CO2 22 22 - 32 mmol/L   Glucose, Bld 95 70 - 99 mg/dL    Comment: Glucose reference range applies only to samples taken after fasting for at least 8 hours.   BUN 9 6 - 20 mg/dL   Creatinine, Ser 6.94 (L) 0.44 - 1.00 mg/dL   Calcium 7.6 (L) 8.9 - 10.3 mg/dL   Total Protein 5.1 (L) 6.5 - 8.1 g/dL   Albumin 2.8 (L) 3.5 - 5.0 g/dL   AST 503 (H) 15 - 41 U/L   ALT 70 (H) 0 - 44 U/L   Alkaline Phosphatase 358 (H) 38 - 126 U/L   Total Bilirubin 1.8 (H) 0.3 - 1.2 mg/dL   GFR, Estimated >88 >82  mL/min    Comment: (NOTE) Calculated using the CKD-EPI Creatinine Equation (2021)    Anion gap 19 (H) 5 - 15    Comment: Performed at [facility], [address] [place], [place] [address]  CBC with Differential     Status: Abnormal   Collection Time: 02/22/22  6:00 PM  Result Value Ref Range   WBC 10.5 4.0 - 10.5 K/uL   RBC 2.92 (L) 3.87 - 5.11 MIL/uL   Hemoglobin 9.8 (L) 12.0 - 15.0 g/dL   HCT 91.7 (L) 91.5 - 05.6 %   MCV 101.0 (H) 80.0 - 100.0 fL   MCH 33.6 26.0 - 34.0 pg   MCHC 33.2 30.0 - 36.0 g/dL   RDW 97.9 48.0 - 16.5 %   Platelets 261 150 - 400 K/uL   nRBC 0.0 0.0 - 0.2 %   Neutrophils Relative % 75 %   Neutro Abs 7.9 (H) 1.7 - 7.7 K/uL   Lymphocytes Relative 5 %   Lymphs Abs 0.6 (L) 0.7 - 4.0 K/uL   Monocytes Relative 12 %   Monocytes Absolute 1.2 (H) 0.1 - 1.0 K/uL   Eosinophils Relative 3 %   Eosinophils Absolute 0.3 0.0 - 0.5 K/uL   Basophils Relative 1 %   Basophils Absolute 0.1 0.0 - 0.1 K/uL   Immature Granulocytes 4 %   Abs Immature Granulocytes 0.41 (H) 0.00 - 0.07 K/uL    Comment: Performed at [facility], [address] [place], [place] [address]  Lipase, blood     Status: None   Collection Time: 02/22/22  6:00 PM  Result Value Ref Range   Lipase 19 11 - 51 U/L    Comment: Performed at [facility], [address] [place], [place] [address]  Troponin I (High Sensitivity)     Status: Abnormal   Collection Time: 02/22/22  6:00 PM  Result Value Ref Range   Troponin I (High Sensitivity) 21 (H) <18 ng/L    Comment: (NOTE) Elevated high sensitivity troponin I (hsTnI) values and significant  changes across serial measurements may suggest ACS but many other  chronic and acute conditions are known to elevate hsTnI results.  Refer to the "Links" section for chest pain algorithms and additional  guidance. Performed at [facility], [address] [place], [place] [address]    Ethanol     Status: None   Collection Time: 02/22/22  6:00 PM  Result Value Ref Range   Alcohol, Ethyl (B) <10 <10 mg/dL    Comment: (  NOTE) Lowest detectable limit for serum alcohol is 10 mg/dL.  For medical purposes only. Performed at [facility], [address] [place], [place] [address]   Magnesium     Status: Abnormal   Collection Time: 02/22/22  6:00 PM  Result Value Ref Range   Magnesium 1.3 (L) 1.7 - 2.4 mg/dL    Comment: Performed at [facility], [address] [place], [place] [address]  Ammonia     Status: Abnormal   Collection Time: 02/22/22  6:00 PM  Result Value Ref Range   Ammonia 41 (H) 9 - 35 umol/L    Comment: Performed at [facility], [address] [place], [place] [address]  Resp Panel by RT-PCR (Flu A&B, Covid) Nasopharyngeal Swab     Status: None   Collection Time: 02/22/22  6:02 PM   Specimen: Nasopharyngeal Swab; Nasopharyngeal(NP) swabs in vial transport medium  Result Value Ref Range   SARS Coronavirus 2 by RT PCR NEGATIVE NEGATIVE    Comment: (NOTE) SARS-CoV-2 target nucleic acids are NOT DETECTED.  The SARS-CoV-2 RNA is generally detectable in upper respiratory specimens during the acute phase of infection. The lowest concentration of SARS-CoV-2 viral copies this assay can detect is 138 copies/mL. A negative result does not preclude SARS-Cov-2 infection and should not be used as the sole basis for treatment or other patient management decisions. A negative result may occur with  improper specimen collection/handling, submission of specimen other than nasopharyngeal swab, presence of viral mutation(s) within the areas targeted by this assay, and inadequate number of viral copies(<138 copies/mL). A negative result must be combined with clinical observations, patient history, and epidemiological information. The expected result is Negative.  Fact Sheet for Patients:   BloggerCourse.comhttps://www.fda.gov/media/152166/download  Fact Sheet for Healthcare Providers:  SeriousBroker.ithttps://www.fda.gov/media/152162/download  This test is no t yet approved or cleared by the [place] FDA and  has been authorized for detection and/or diagnosis of SARS-CoV-2 by FDA under an Emergency Use Authorization (EUA). This EUA will remain  in effect (meaning this test can be used) for the duration of the COVID-19 declaration under Section 564(b)(1) of the Act, 21 U.S.C.section 360bbb-3(b)(1), unless the authorization is terminated  or revoked sooner.       Influenza A by PCR NEGATIVE NEGATIVE   Influenza B by PCR NEGATIVE NEGATIVE    Comment: (NOTE) The Xpert Xpress SARS-CoV-2/FLU/RSV plus assay is intended as an aid in the diagnosis of influenza from Nasopharyngeal swab specimens and should not be used as a sole basis for treatment. Nasal washings and aspirates are unacceptable for Xpert Xpress SARS-CoV-2/FLU/RSV testing.  Fact Sheet for Patients: BloggerCourse.comhttps://www.fda.gov/media/152166/download  Fact Sheet for Healthcare Providers: SeriousBroker.ithttps://www.fda.gov/media/152162/download  This test is not yet approved or cleared by the [place] FDA and has been authorized for detection and/or diagnosis of SARS-CoV-2 by FDA under an Emergency Use Authorization (EUA). This EUA will remain in effect (meaning this test can be used) for the duration of the COVID-19 declaration under Section 564(b)(1) of the Act, 21 U.S.C. section 360bbb-3(b)(1), unless the authorization is terminated or revoked.  Performed at [facility], [address] [place], [place] [address]    CT Head Wo Contrast  Result Date: 02/22/2022 CLINICAL DATA:  Mental status change, unknown cause, unclear if head injury. Fall today. Generalized weakness. EXAM: CT HEAD WITHOUT CONTRAST TECHNIQUE: Contiguous axial images were obtained from the base of the skull through the vertex without intravenous contrast.  RADIATION DOSE REDUCTION: This exam  was performed according to the departmental dose-optimization program which includes automated exposure control, adjustment of the mA and/or kV according to patient size and/or use of iterative reconstruction technique. COMPARISON:  Prior head CT examinations 11/22/2021 and earlier. FINDINGS: Brain: Mild generalized cerebral and cerebellar atrophy. A subdural collection overlying the right cerebral hemisphere has increased in size as compared to the head CT of 11/22/2021 (now measuring up to 12 mm in greatest thickness). This is compatible with a hematoma. The majority of the collection is low to intermediate density. However, new from the prior head CT, there is small volume acute hemorrhage within the collection (for instance as seen on series 2, image 19). Mild mass effect upon the underlying right cerebral hemisphere without midline shift. Additionally, there is small volume acute hemorrhage overlying the anterolateral aspect of the right temporal lobe, which may be subdural or subarachnoid (series 2, image 7). Mild patchy and ill-defined hypoattenuation within the cerebral white matter, nonspecific but compatible chronic small vessel ischemic disease No demarcated cortical infarct. No evidence of an intracranial mass. Vascular: No hyperdense vessel.  Atherosclerotic calcifications. Skull: Normal. Negative for fracture or focal lesion. Sinuses/Orbits: Visualized orbits show no acute finding. Mild mucosal thickening within the left maxillary sinus at the imaged levels. These results were called by telephone at the time of interpretation on 02/22/2022 at 6:35 pm to provider MATTHEW TRIFAN , who verbally acknowledged these results. IMPRESSION: A subdural collection overlying the right cerebral hemisphere has increased in size since the prior head CT of 11/22/2021 (now measuring up to 12 mm in greatest thickness). This is compatible with a subdural hematoma. The majority of the  collection is low-to-intermediate density. However, new from the prior head CT, there is small-volume hyperdense acute hemorrhage within portions of the collection. Mild mass effect upon the underlying right cerebral hemisphere without midline shift. Additionally, there is small-volume acute hemorrhage overlying the anterolateral aspect of the right temporal lobe, which may be subdural or subarachnoid. Mild chronic small vessel ischemic changes within the cerebral white matter. Mild general parenchymal atrophy. Mild mucosal thickening within the left maxillary sinus at the imaged levels. Electronically Signed   By: Jackey Loge D.O.   On: 02/22/2022 18:39    Pending Labs Unresulted Labs (From admission, onward)     Start     Ordered   02/22/22 1952  Protime-INR  Add-on,   AD        02/22/22 1951            Vitals/Pain Today's Vitals   02/22/22 1723 02/22/22 1730 02/22/22 1830 02/22/22 1900  BP:  131/71 126/67 119/67  Pulse:  83 88 84  Resp:  10 15 14   Temp:      TempSrc:      SpO2:  96% 93% 91%  Weight: 38.6 kg     Height: 5' (1.524 m)     PainSc: 0-No pain       Isolation Precautions No active isolations  Medications Medications  magnesium sulfate IVPB 2 g 50 mL (2 g Intravenous New Bag/Given 02/22/22 1937)  dextrose 5 % and 0.45 % NaCl with KCl 20 mEq/L infusion ( Intravenous New Bag/Given 02/22/22 1938)  magic mouthwash w/lidocaine (5 mLs Oral Given 02/22/22 1950)  LORazepam (ATIVAN) injection 0-4 mg (0 mg Intravenous Not Given 02/22/22 1947)    Or  LORazepam (ATIVAN) tablet 0-4 mg ( Oral See Alternative 02/22/22 1947)  LORazepam (ATIVAN) injection 0-4 mg (has no administration in time range)  Or  LORazepam (ATIVAN) tablet 0-4 mg (has no administration in time range)  thiamine tablet 100 mg (has no administration in time range)    Or  thiamine (B-1) injection 100 mg (has no administration in time range)  lip balm (CARMEX) ointment 1 application (1 application Topical Given  02/22/22 1957)  sodium chloride 0.9 % bolus 1,000 mL (0 mLs Intravenous Stopped 02/22/22 1907)  LORazepam (ATIVAN) injection 0.5 mg (0.5 mg Intravenous Given 02/22/22 1938)  ondansetron (ZOFRAN) injection 4 mg (4 mg Intravenous Given 02/22/22 1948)    Mobility non-ambulatory Moderate fall risk   Focused Assessments    R Recommendations: See Admitting Provider Note  Report given to:   Additional Notes:

## 2022-02-22 NOTE — Assessment & Plan Note (Addendum)
-  transition to comfort care ?

## 2022-02-22 NOTE — H&P (Signed)
History and Physical    Kelly Zuniga HGD:[id number] DOB: 04-17-1966 DOA: 02/22/2022  PCP: Maximiano Coss, NP  Patient coming from: Home  I have personally briefly reviewed patient's old medical records in [place]  Chief Complaint: Headache  HPI: Kelly Zuniga is a 56 y.o. female with medical history significant for chronic alcohol use with suspected Warnicke's encephalopathy associated with gait abnormality, chronic migraines and seizure-like activity on lamotrigine, macrocytic anemia, depression/anxiety, and chronic deconditioning with failure to thrive who presented to the ED for evaluation of possible seizure with headache.  History is somewhat limited from patient due to somnolence and chronic encephalopathy and is otherwise supplemented by EDP, chart review, and patient's mother at bedside.  Patient states she had recurrent seizure-like activity last night.  She does not recall the events surrounding it.  She says when she woke up she fell getting out of her bed.  She says she did hit her head and has been having a headache.  She says her last use of alcohol was last night (3/4).  Her mother states that she is probably drinking 4-5 drinks of bourbon daily.  She does report a history of alcohol withdrawal.  Patient currently lives with her significant other.  Her mom states that patient is not very active, has gait difficulty and requires assistance when attempting to ambulate.  She has poor memory and cognitive status at baseline.  She is seen by neurology who suspect Warnicke's encephalopathy related to chronic alcohol use.  ED Course   Labs/Imaging on admission: I have personally reviewed following labs and imaging studies.  Initial vitals showed BP 129/73, pulse 84, RR 11, temp 97.5 F, SPO2 97% on room air.  Labs show WBC 10.5, hemoglobin 9.8, platelets 261,000, sodium 129, potassium 3.0, bicarb 22, BUN 9, creatinine 0.40, serum glucose 95, AST 167, ALT 70, alk phos 358,  total bilirubin 1.8, lipase 19, troponin 21, magnesium 1.3, ammonia 41, serum ethanol <10.  SARS-CoV-2 and influenza PCR negative.  CT head without contrast showed increased size in subdural collection overlying the right cerebral hemisphere compatible with subdural hematoma.  Small volume acute hemorrhage seen in this collection with mild mass effect on the right cerebral hemisphere without midline shift.  Additional small volume acute hemorrhage overlying the anterolateral aspect of the right temporal lobe also seen, may be subdural or subarachnoid.  Mild chronic small vessel ischemic changes and mild general parenchymal atrophy noted.  Patient was given 0.5 mg IV Ativan and started on CIWA protocol.  She was given 1 L normal saline, 2 g IV magnesium, and maintenance fluids with IV D5-1/2 NS with K 20 mEq.  EDP discussed with on-call neurosurgery, Dr. Ellene Route, who recommended admission to [facility] and he will evaluate in a.m. to determine further management.  He recommended can continue Lamictal for seizure prophylaxis and no need for repeat CT head in a.m. unless clinical picture deteriorates.  The hospitalist service was consulted to admit for further evaluation and management.  Review of Systems: All systems reviewed and are negative except as documented in history of present illness above.   Past Medical History:  Diagnosis Date   Alcohol abuse    Allergy    Anemia    low iron   Anxiety    Asthma    Asthma    Phreesia 11/09/2020   Depression    posibly bipolar disorder. 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)    Substance abuse ([place])    [place] 11/09/2020    Past Surgical History:  Procedure Laterality Date   benign  cyst     removed from left cheek   RADIOLOGY WITH ANESTHESIA N/A 04/15/2021   Procedure: MRI/CT OF ABDOMEN WITH ANESTHESIA;  Surgeon: Radiologist, Medication, MD;  Location: [facility];  Service: Radiology;  Laterality: N/A;   TONSILECTOMY, ADENOIDECTOMY, BILATERAL MYRINGOTOMY AND TUBES      Social History:  reports that she has been smoking cigarettes. She has been smoking an average of .25 packs per day. She has never used smokeless tobacco. She reports current alcohol use. She reports that she does not use drugs.  Allergies  Allergen Reactions   Acetaminophen Other (See Comments)    Makes LFT get elevated and causes liver problems/issues    Lithium Other (See Comments)    Causes the patient to have no appetite   Promethazine-Codeine Nausea And Vomiting and Other (See Comments)    Any codeine-based cough syrups cause N/V   Codeine Nausea And Vomiting   Morphine And Related Nausea And Vomiting    Family History  Problem Relation Age of Onset   Coronary artery disease Other        1st degree relative female <50   Depression Other        Family Hx   Diabetes Other        Family Hx 1st degree relative   Ovarian cancer Other        Family Hx    Healthy Mother    Diabetes Father    Gout Father    Hypertension Father    Heart failure Father    Cancer Father    Heart attack Father      Prior to Admission medications   Medication Sig Start Date End Date Taking? Authorizing Provider  albuterol (PROVENTIL) (2.5 MG/3ML) 0.083% nebulizer solution Take 2.5 mg by nebulization every 6 (six) hours as needed for wheezing or shortness of breath. 09/13/15   [provider]  albuterol (VENTOLIN HFA) 108 (90 Base) MCG/ACT inhaler Inhale 2 puffs into the lungs See admin instructions. Inhale 2 puffs into the lungs every 4-6 hours as needed for shortness of breath or wheezing 08/19/21   Wendie Agreste, MD  amoxicillin-clavulanate (AUGMENTIN) 875-125 MG tablet Take 1 tablet by mouth 2 (two)  times daily. 02/17/22   Maximiano Coss, NP  B Complex-C (SUPER B COMPLEX/VITAMIN C PO) Take 1 tablet by mouth daily. With Folic Acid    [provider]  BIOTIN PO Take 1 tablet by mouth in the morning.    [provider]  cetirizine (ZYRTEC) 10 MG tablet Take 10 mg by mouth daily.    [provider]  clonazePAM (KLONOPIN) 0.5 MG tablet Take 1 tablet (0.5 mg total) by mouth 3 (three) times daily as needed for anxiety. 11/11/20   Mozingo, Berdie Ogren, NP  escitalopram (LEXAPRO) 20 MG tablet Take 1 tablet (20 mg total) by mouth daily. 01/07/22   Mozingo, Berdie Ogren, NP  lamoTRIgine (LAMICTAL) 100 MG tablet Take 1 tablet (100 mg total) by mouth 2 (two) times daily. 01/08/22   Marcial Pacas, MD  lamoTRIgine (LAMICTAL) 25 MG tablet 1 tab bid x  one week 2 tab bid x 2nd week 3 tab bid x 3rd week 01/08/22   Marcial Pacas, MD  LOTEMAX SM 0.38 % GEL Apply 1 drop to eye 4 (four) times daily. 07/28/21   [provider]  magnesium gluconate (MAGONATE) 500 MG tablet Take 500 mg by mouth daily.    [provider]  Multiple Vitamins-Minerals (MULTI ADULT GUMMIES) CHEW Chew 2 tablets by mouth in the morning.    [provider]  naproxen sodium (ALEVE) 220 MG tablet Take 220 mg by mouth 2 (two) times daily as needed (for mild pain).    [provider]  omeprazole (PRILOSEC) 20 MG capsule Take 1 capsule (20 mg total) by mouth 2 (two) times daily before a meal. 11/22/21   Varney Biles, MD  thiamine 100 MG tablet Take 1 tablet (100 mg total) by mouth daily. 02/13/21   Geradine Girt, DO  thiamine 100 MG tablet Take 1 tablet (100 mg total) by mouth daily. 01/08/22   Marcial Pacas, MD  triamcinolone (NASACORT) 55 MCG/ACT nasal inhaler Place 2 sprays into the nose daily. For allergies 02/23/13   Lindell Spar I, NP    Physical Exam: Vitals:   02/22/22 1723 02/22/22 1730 02/22/22 1830 02/22/22 1900  BP:  131/71 126/67 119/67  Pulse:  83 88 84  Resp:  10 15 14    Temp:      TempSrc:      SpO2:  96% 93% 91%  Weight: 38.6 kg     Height: 5' (1.524 m)      Constitutional: Chronically ill-appearing frail and thin woman resting in bed with head slightly elevated, somnolent and semi-arousable Eyes: PERRL, lids and conjunctivae normal ENMT: Mucous membranes are dry. Posterior pharynx clear of any exudate or lesions.Normal dentition.  Bite mark lower lip. Neck: normal, supple, no masses. Respiratory: clear to auscultation anteriorly.  Normal respiratory effort. No accessory muscle use.  Cardiovascular: Regular rate and rhythm, no murmurs / rubs / gallops. No extremity edema. 2+ pedal pulses. Abdomen: no tenderness, no masses palpated.  Musculoskeletal: Thin extremities.  No clubbing / cyanosis. No joint deformity upper and lower extremities. No contractures. Normal muscle tone.  Skin: no rashes, lesions, ulcers. No induration Neurologic: CN 2-12 grossly intact. Sensation intact.  Moving all extremities spontaneously and withdraws to noxious stimuli. Psychiatric: Somnolent, semi- arousable.  Oriented to self, recognizes that her mother is at bedside, states that she is at [facility] (see in [place] long ED), does not answer when asked the year.  EKG: Personally reviewed. Sinus rhythm, rate 85, LAFB, no acute ischemic changes.  Not significantly changed when compared to prior.  Assessment/Plan Principal Problem:   Acute on chronic intracranial subdural hematoma (HCC) Active Problems:   Alcohol dependence (HCC)   Hyponatremia   Hypomagnesemia   Hypokalemia   Seizure-like activity (HCC)   Elevated LFTs   Macrocytic anemia   Chronic migraine w/o aura w/o status migrainosus, not intractable   Depression with anxiety   Failure to thrive in adult   Rochanda Harpham is a 56 y.o. female with medical history significant for chronic alcohol use with suspected Warnicke's encephalopathy associated with gait abnormality, chronic migraines and seizure-like  activity on lamotrigine, macrocytic anemia, depression/anxiety, and chronic deconditioning with failure to thrive who is admitted with acute on chronic subdural hematoma in setting of seizure-like activity with presumed head injury.  Neurosurgery recommend admission to [facility] and would see in consultation in AM.  Placed on CIWA  protocol due to high risk for alcohol withdrawal.  Assessment and Plan: * Acute on chronic intracranial subdural hematoma (HCC) CT head shows increased size of right cerebral hemisphere subdural hematoma.  New small volume of acute hemorrhage changes seen within this collection and overlying the anterior lateral aspect of the right temporal lobe.  May be related to reported trauma to head after fall at home.  She is not on any blood thinners. -Neurosurgery, Dr. Ellene Route, recommends transfer to [facility] and he will evaluate in a.m. to determine further management -Avoid all blood thinners -Continue neurochecks -Continue Lamictal for seizure prophylaxis  Alcohol dependence ([place]) Patient with chronic daily alcohol use, likely 4-5 drinks bourbon daily per family.  Last drink reportedly night of 3/4.  Has history of withdrawal.  Has suspected Warnicke's encephalopathy per outpatient neurology evaluation. -Continue CIWA protocol with Ativan as needed -Continue thiamine, folate, MVM  Hyponatremia Mild with sodium 129 on admission.  Likely related to chronic alcohol use and low solute diet. -Continue IVF's with NS@75  mL/hour overnight -Repeat labs in a.m.  Hypokalemia Supplement with IV runs and add potassium to maintenance fluids.  Hypomagnesemia Given 2 g IV magnesium in the ED.  Repeat labs in AM.  Seizure-like activity ([place]) Continue Lamictal, CIWA with Ativan as needed, seizure precautions.  Macrocytic anemia Hemoglobin stable at 9.8.  Likely secondary to chronic alcohol use.  Labs 02/17/2022 showed folate 14.1 and vitamin B12 843.  Continue to  monitor.  Elevated LFTs Chronically elevated LFTs in the setting of chronic alcohol use.  Alk phos higher than recent baseline.  She had MRCP in April 2022 which showed dilated CBD with possible impacted stone.  ERCP deferred at that time as she was asymptomatic.  Seen by GI as an outpatient who ordered follow-up CT A/P to assess for need for endoscopic evaluation however imaging was not completed. -Continue to monitor LFTs at this time as she is not having any sign of symptomatic CBD stone -If LFTs rise or develops abdominal symptoms then consider obtaining RUQ ultrasound  Depression with anxiety Hold Lexapro in setting of seizure-like activity, intracranial bleed, and alcohol use disorder.  Chronic migraine w/o aura w/o status migrainosus, not intractable Continue Lamictal.  Failure to thrive in adult Secondary to chronic alcohol use with protein calorie malnutrition, deconditioning, and presumed Warnicke's encephalopathy with gait instability. -CODE STATUS is DNR/DNI, confirmed with patient's mom on admission -Continue fall/seizure/delirium precautions  DVT prophylaxis: SCDs Start: 02/22/22 2028 Code Status: DNR, confirmed with patient's mother at bedside. Family Communication: Patient's mother at bedside. Disposition Plan: Transfer to [facility] pending clinical progress and neurosurgery evaluation. Consults called: Neurosurgery Severity of Illness: The appropriate patient status for this patient is INPATIENT. Inpatient status is judged to be reasonable and necessary in order to provide the required intensity of service to ensure the patient's safety. The patient's presenting symptoms, physical exam findings, and initial radiographic and laboratory data in the context of their chronic comorbidities is felt to place them at high risk for further clinical deterioration. Furthermore, it is not anticipated that the patient will be medically stable for discharge from the  hospital within 2 midnights of admission.   * I certify that at the point of admission it is my clinical judgment that the patient will require inpatient hospital care spanning beyond 2 midnights from the point of admission due to high intensity of service, high risk for further deterioration and high frequency of surveillance required.Zada Finders MD [facility]  If 7PM-7AM, please contact night-coverage [email or web address]  02/22/2022, 8:54 PM

## 2022-02-22 NOTE — Assessment & Plan Note (Addendum)
Secondary to chronic alcohol use with protein calorie malnutrition, deconditioning, and presumed Warnicke's encephalopathy with gait instability. ?-CODE STATUS is DNR/DNI ?-poor overall prognosis--- will get palliative care consult as Mom would prefer to focus on comfort ?

## 2022-02-23 ENCOUNTER — Inpatient Hospital Stay (HOSPITAL_COMMUNITY): Payer: No Typology Code available for payment source

## 2022-02-23 DIAGNOSIS — F418 Other specified anxiety disorders: Secondary | ICD-10-CM

## 2022-02-23 DIAGNOSIS — G43709 Chronic migraine without aura, not intractable, without status migrainosus: Secondary | ICD-10-CM | POA: Diagnosis not present

## 2022-02-23 DIAGNOSIS — R7989 Other specified abnormal findings of blood chemistry: Secondary | ICD-10-CM

## 2022-02-23 DIAGNOSIS — I6201 Nontraumatic acute subdural hemorrhage: Secondary | ICD-10-CM | POA: Diagnosis not present

## 2022-02-23 DIAGNOSIS — F1029 Alcohol dependence with unspecified alcohol-induced disorder: Secondary | ICD-10-CM | POA: Diagnosis not present

## 2022-02-23 DIAGNOSIS — R627 Adult failure to thrive: Secondary | ICD-10-CM

## 2022-02-23 DIAGNOSIS — R509 Fever, unspecified: Secondary | ICD-10-CM

## 2022-02-23 LAB — HIV ANTIBODY (ROUTINE TESTING W REFLEX): HIV Screen 4th Generation wRfx: NONREACTIVE

## 2022-02-23 LAB — GLUCOSE, CAPILLARY: Glucose-Capillary: 116 mg/dL — ABNORMAL HIGH (ref 70–99)

## 2022-02-23 LAB — MRSA NEXT GEN BY PCR, NASAL: MRSA by PCR Next Gen: NOT DETECTED

## 2022-02-23 MED ORDER — LIDOCAINE VISCOUS HCL 2 % MT SOLN
15.0000 mL | OROMUCOSAL | Status: DC | PRN
  Administered 2022-02-23 – 2022-02-25 (×3): 15 mL via OROMUCOSAL
  Filled 2022-02-23 (×3): qty 15

## 2022-02-23 MED ORDER — ACETAMINOPHEN 325 MG PO TABS
650.0000 mg | ORAL_TABLET | Freq: Three times a day (TID) | ORAL | Status: DC | PRN
  Administered 2022-02-23 – 2022-02-25 (×3): 650 mg via ORAL
  Filled 2022-02-23 (×3): qty 2

## 2022-02-23 MED ORDER — POTASSIUM CHLORIDE IN NACL 40-0.9 MEQ/L-% IV SOLN
INTRAVENOUS | Status: DC
  Filled 2022-02-23 (×3): qty 1000

## 2022-02-23 NOTE — Evaluation (Signed)
Clinical/Bedside Swallow Evaluation ?Patient Details  ?Name: Kelly Zuniga ?MRN: [id number] ?Date of Birth: [birth year] ? ?Today's Date: 02/23/2022 ?Time: SLP Start Time (ACUTE ONLY): 1330 SLP Stop Time (ACUTE ONLY): 1345 ?SLP Time Calculation (min) (ACUTE ONLY): 15 min ? ?Past Medical History:  ?Past Medical History:  ?Diagnosis Date  ? Alcohol abuse   ? Allergy   ? Anemia   ? low iron  ? Anxiety   ? Asthma   ? Asthma   ? Phreesia 11/09/2020  ? Depression   ? posibly bipolar disorder. Sees Dr [name] for psychiatric care and Dr [name] for therapy  ? Depression   ? Phreesia 11/09/2020  ? Headache(784.0)   ? last migraine 2004  ? 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 Hypertension   ? never on meds   ? Pneumonia   ? at 13 years  ? Seizure (HCC) 02/09/2021  ? Seizure (HCC)   ? Substance abuse (HCC)   ? Phreesia 11/09/2020  ? ?Past Surgical History:  ?Past Surgical History:  ?Procedure Laterality Date  ? benign cyst    ? removed from left cheek  ? RADIOLOGY WITH ANESTHESIA N/A 04/15/2021  ? Procedure: MRI/CT OF ABDOMEN WITH ANESTHESIA;  Surgeon: Radiologist, Medication, MD;  Location: [facility] OR;  Service: Radiology;  Laterality: N/A;  ? TONSILECTOMY, ADENOIDECTOMY, BILATERAL MYRINGOTOMY AND TUBES    ? ?HPI:  ?Kelly Zuniga is a 56 y.o. female with medical history significant for chronic alcohol use with suspected Warnicke's encephalopathy associated with gait abnormality, chronic migraines and seizure-like activity on lamotrigine, macrocytic anemia, depression/anxiety, and chronic deconditioning with failure to thrive who is admitted with acute on chronic subdural hematoma in setting of seizure-like activity with presumed head injury.  ?  ?Assessment / Plan / Recommendation  ?Clinical Impression ? The pt was seen with her mother at bedside for clinical swallow evaluation. Oral motor exam indicated adequate labial/lingual strength and ROM. Patient's mother reports that pt has sores on cheeks that functionally impact  eating/drinking. PT was presented with PO trials of thin liquid, puree and regular textures. Pt displayed immediate coughing with wet vocal quality following trials of thin liquid via straw cup. Pt instructuted to take inidividual sips of thin and this reduced coughing behaviors. Pt tolerated puree applesauce and regular texture graham cracker with no overt s/sx of aspiration. When offerred graham cracker, noted that the pt nibbled on the edge of cracker, and never demonstrated effective bite-and-pull to facilitate self-feeding. Pt's mother reports that the pt may nibble to compensate for oral sores. SLP recommends placing the pt NPO at this time, however medications may be administered in purees. Patient may receive intermittent ice chips before/after oral care. MBS scheduled for next date. ?SLP Visit Diagnosis: Dysphagia, unspecified (R13.10) ?   ?Aspiration Risk ? Moderate aspiration risk  ?  ?Diet Recommendation NPO;Other (Comment);Ice chips PRN after oral care (Medications in Puree)  ? ?Liquid Administration via: Spoon ?Medication Administration: Crushed with puree ?Supervision: Full supervision/cueing for compensatory strategies ?Compensations: Minimize environmental distractions;Small sips/bites;Slow rate ?Postural Changes: Remain upright for at least 30 minutes after po intake  ?  ?Other  Recommendations Oral Care Recommendations: Oral care BID   ? ?Recommendations for follow up therapy are one component of a multi-disciplinary discharge planning process, led by the attending physician.  Recommendations may be updated based on patient status, additional functional criteria and insurance authorization. ? ?Follow up Recommendations Other (comment) (TBD)  ? ? ?  ?Assistance Recommended at Discharge PRN  ?Functional Status Assessment Patient  has had a recent decline in their functional status and demonstrates the ability to make significant improvements in function in a reasonable and predictable amount of time.   ?Frequency and Duration min 2x/week  ?2 weeks ?  ?   ? ?Prognosis Prognosis for Safe Diet Advancement: Fair ?Barriers to Reach Goals: Cognitive deficits  ? ?  ? ?Swallow Study   ?General HPI: Kelly Zuniga is a 56 y.o. female with medical history significant for chronic alcohol use with suspected Warnicke's encephalopathy associated with gait abnormality, chronic migraines and seizure-like activity on lamotrigine, macrocytic anemia, depression/anxiety, and chronic deconditioning with failure to thrive who is admitted with acute on chronic subdural hematoma in setting of seizure-like activity with presumed head injury. ?Type of Study: Bedside Swallow Evaluation ?Diet Prior to this Study: Regular;Thin liquids ?Temperature Spikes Noted: No ?Respiratory Status: Nasal cannula ?History of Recent Intubation: No ?Behavior/Cognition: Cooperative;Pleasant mood;Lethargic/Drowsy ?Oral Cavity Assessment: Dry ?Oral Care Completed by SLP: No ?Oral Cavity - Dentition: Adequate natural dentition ?Vision: Functional for self-feeding ?Self-Feeding Abilities: Able to feed self ?Patient Positioning: Upright in bed ?Baseline Vocal Quality: Normal  ?  ?Oral/Motor/Sensory Function Overall Oral Motor/Sensory Function: Within functional limits   ?Ice Chips Ice chips: Not tested   ?Thin Liquid Thin Liquid: Impaired ?Presentation: Straw ?Oral Phase Impairments: Poor awareness of bolus ?Oral Phase Functional Implications: Prolonged oral transit ?Pharyngeal  Phase Impairments: Cough - Immediate;Wet Vocal Quality  ?  ?Nectar Thick Nectar Thick Liquid: Not tested   ?Honey Thick Honey Thick Liquid: Not tested   ?Puree Puree: Within functional limits ?Presentation: Spoon   ?Solid ? ? ?  Solid: Within functional limits  ? ?  ? ?Kelly Zuniga ?02/23/2022,2:01 PM ? ? ? ? ?

## 2022-02-23 NOTE — Progress Notes (Signed)
PROGRESS NOTE  Kelly Zuniga TIR:[id number] DOB: 11/07/1966 DOA: 02/22/2022 PCP: [name], NP   LOS: 1 day   Brief Narrative / Interim history: Kelly Zuniga is a 56 y.o. female with medical history significant for chronic alcohol use with suspected Warnicke's encephalopathy associated with gait abnormality, chronic migraines and seizure-like activity on lamotrigine, macrocytic anemia, depression/anxiety, and chronic deconditioning with failure to thrive who is admitted with acute on chronic subdural hematoma in setting of seizure-like activity with presumed head injury.  Neurosurgery recommend admission to [facility] and would see in consultation in AM.  Placed on CIWA protocol due to high risk for alcohol withdrawal.  Subjective / 24h Interval events: -Lethargic this morning but wakes up upon prompting.  Appears confused.  Can answer basic questions but does not elaborate when asked orientation questions  Assesement and Plan: Principal Problem:   Acute on chronic intracranial subdural hematoma (HCC) Active Problems:   Alcohol dependence (HCC)   Hyponatremia   Hypomagnesemia   Hypokalemia   Seizure-like activity (HCC)   Elevated LFTs   Macrocytic anemia   Chronic migraine w/o aura w/o status migrainosus, not intractable   Depression with anxiety   Failure to thrive in adult   Fever   Assessment and Plan: Principal problem Acute on chronic intracranial subdural hematoma -CT head on admission shows increased size of right cerebral hemisphere subdural hematoma.  New small volume of acute hemorrhage changes seen within this collection and overlying the anterior lateral aspect of the right temporal lobe.  May be related to reported trauma to head after fall at home.  She is not on any blood thinners.  She tells me that last fell was the morning prior to admission.  Neurosurgery consulted, appreciate input.  Avoid all blood thinners. Continue Lamictal for seizure  prophylaxis  Active problems Alcohol dependence (HCC) -Patient with chronic daily alcohol use, likely 4-5 drinks bourbon daily per family.  Last drink reportedly night of 3/4.  Has history of withdrawals.  Has suspected Warnicke's encephalopathy per outpatient neurology evaluation.  Continue thiamine, folate, multivitamins  Fever-new onset this morning, 3/6, nontoxic-appearing, WBC is normal.  Obtain chest x-ray, UA, blood cultures.  She is not reliable as far as her symptoms go.  Hyponatremia -Mild with sodium 129 on admission.  Likely related to chronic alcohol use and low solute diet.  On fluids.  She looks dehydrated  Hypokalemia -continue to supplement with fluids  Hypomagnesemia -supplement, monitor  Seizure-like activity (HCC) -Continue Lamictal, CIWA with Ativan as needed, seizure precautions.  Macrocytic anemia -Hemoglobin stable at 9.8.  Likely secondary to chronic alcohol use.  Labs 02/17/2022 showed folate 14.1 and vitamin B12 843.  Continue to monitor.  Elevated LFTs -chronic elevation in the setting of chronic EtOH. She had MRCP in April 2022 which showed dilated CBD with possible impacted stone.  ERCP deferred at that time as she was asymptomatic.  Seen by GI as an outpatient who ordered follow-up CT A/P to assess for need for endoscopic evaluation however imaging was not completed.  Continue to monitor LFTs.  Obtain right upper quadrant ultrasound to look at the gallbladder/CBD  Depression with anxiety -Hold Lexapro in setting of seizure-like activity, intracranial bleed, and alcohol use disorder.  Chronic migraine w/o aura w/o status migrainosus, not intractable -Continue Lamictal.  Failure to thrive in adult -Secondary to chronic alcohol use with protein calorie malnutrition, deconditioning, and presumed Warnicke's encephalopathy with gait instability. CODE STATUS is DNR/DNI, confirmed with patient's mom on admission.  Consult  RD    Scheduled Meds:  folic acid  1 mg Oral  Daily   lamoTRIgine  100 mg Oral BID   magnesium gluconate  500 mg Oral Daily   multivitamin with minerals  1 tablet Oral Daily   sodium chloride flush  3 mL Intravenous Q12H   thiamine  100 mg Oral Daily   Or   thiamine  100 mg Intravenous Daily   Continuous Infusions:  0.9 % NaCl with KCl 40 mEq / L 75 mL/hr at 02/22/22 2309   PRN Meds:.albuterol, lip balm, LORazepam **OR** LORazepam, magic mouthwash w/lidocaine, ondansetron **OR** ondansetron (ZOFRAN) IV, senna-docusate  Diet Orders (From admission, onward)     Start     Ordered   02/23/22 0001  Diet NPO time specified  Diet effective midnight        02/22/22 2030            DVT prophylaxis: SCDs Start: 02/22/22 2028   Lab Results  Component Value Date   PLT 256 02/22/2022      Code Status: DNR  Family Communication: no family at bedside   Status is: Inpatient  Remains inpatient appropriate because: severity of illness  Level of care: Telemetry Medical  Consultants:  Neurosurgery   Procedures:  none  Microbiology  none  Antimicrobials: none    Objective: Vitals:   02/22/22 1935 02/22/22 2223 02/23/22 0320 02/23/22 0700  BP: 119/67 122/71 133/80 130/64  Pulse: 84 84 96   Resp:  15 16   Temp:  98.2 F (36.8 C) 99.7 F (37.6 C) (!) 100.6 F (38.1 C)  TempSrc:  Oral Oral Oral  SpO2:  96% 98%   Weight:      Height:        Intake/Output Summary (Last 24 hours) at 02/23/2022 0840 Last data filed at 02/22/2022 1907 Gross per 24 hour  Intake 1000 ml  Output --  Net 1000 ml   Wt Readings from Last 3 Encounters:  02/22/22 38.6 kg  02/17/22 39.2 kg  01/08/22 38.3 kg    Examination:  Constitutional: NAD Eyes: no scleral icterus ENMT: Dry mucous membranes Neck: normal, supple Respiratory: clear to auscultation bilaterally, no wheezing, no crackles.  Shallow respirations Cardiovascular: Regular rate and rhythm, no murmurs / rubs / gallops. No LE edema.  Abdomen: non distended, no  tenderness. Bowel sounds positive.  Musculoskeletal: no clubbing / cyanosis.  Skin: no rashes Neurologic: Nonfocal  Data Reviewed: I have independently reviewed following labs and imaging studies   CBC Recent Labs  Lab 02/17/22 0923 02/22/22 1800 02/22/22 2238  WBC 7.9 10.5 9.9  HGB 10.3* 9.8* 9.3*  HCT 30.9* 29.5* 27.5*  PLT 308.0 261 256  MCV 101.2* 101.0* 101.1*  MCH  --  33.6 34.2*  MCHC 33.4 33.2 33.8  RDW 13.8 13.6 13.6  LYMPHSABS 0.5* 0.6*  --   MONOABS 0.7 1.2*  --   EOSABS 0.5 0.3  --   BASOSABS 0.1 0.1  --     Recent Labs  Lab 02/17/22 0923 02/22/22 1750 02/22/22 1800 02/22/22 2238  NA 135  --  129* 129*  K 3.4*  --  3.0* 3.1*  CL 90*  --  88* 93*  CO2 31  --  22 23  GLUCOSE 92  --  95 114*  BUN 11  --  9 7  CREATININE 0.51  --  0.40* 0.70  CALCIUM 9.0  --  7.6* 7.3*  AST 79*  --  167* 157*  ALT 48*  --  70* 66*  ALKPHOS 238*  --  358* 327*  BILITOT 1.6*  --  1.8* 1.7*  ALBUMIN 3.8  --  2.8* 2.3*  MG  --   --  1.3* 2.0  INR  --  1.2  --  1.3*  TSH 1.51  --   --   --   HGBA1C 4.7  --   --   --   AMMONIA  --   --  41*  --     ------------------------------------------------------------------------------------------------------------------ No results for input(s): CHOL, HDL, LDLCALC, TRIG, CHOLHDL, LDLDIRECT in the last 72 hours.  Lab Results  Component Value Date   HGBA1C 4.7 02/17/2022   ------------------------------------------------------------------------------------------------------------------ No results for input(s): TSH, T4TOTAL, T3FREE, THYROIDAB in the last 72 hours.  Invalid input(s): FREET3  Cardiac Enzymes No results for input(s): CKMB, TROPONINI, MYOGLOBIN in the last 168 hours.  Invalid input(s): CK ------------------------------------------------------------------------------------------------------------------    Component Value Date/Time   BNP 43.7 02/24/2021 1707    CBG: No results for input(s): GLUCAP in the  last 168 hours.  Recent Results (from the past 240 hour(s))  Resp Panel by RT-PCR (Flu A&B, Covid) Nasopharyngeal Swab     Status: None   Collection Time: 02/22/22  6:02 PM   Specimen: Nasopharyngeal Swab; Nasopharyngeal(NP) swabs in vial transport medium  Result Value Ref Range Status   SARS Coronavirus 2 by RT PCR NEGATIVE NEGATIVE Final    Comment: (NOTE) SARS-CoV-2 target nucleic acids are NOT DETECTED.  The SARS-CoV-2 RNA is generally detectable in upper respiratory specimens during the acute phase of infection. The lowest concentration of SARS-CoV-2 viral copies this assay can detect is 138 copies/mL. A negative result does not preclude SARS-Cov-2 infection and should not be used as the sole basis for treatment or other patient management decisions. A negative result may occur with  improper specimen collection/handling, submission of specimen other than nasopharyngeal swab, presence of viral mutation(s) within the areas targeted by this assay, and inadequate number of viral copies(<138 copies/mL). A negative result must be combined with clinical observations, patient history, and epidemiological information. The expected result is Negative.  Fact Sheet for Patients:  BloggerCourse.com  Fact Sheet for Healthcare Providers:  SeriousBroker.it  This test is no t yet approved or cleared by the [place] FDA and  has been authorized for detection and/or diagnosis of SARS-CoV-2 by FDA under an Emergency Use Authorization (EUA). This EUA will remain  in effect (meaning this test can be used) for the duration of the COVID-19 declaration under Section 564(b)(1) of the Act, 21 U.S.C.section 360bbb-3(b)(1), unless the authorization is terminated  or revoked sooner.       Influenza A by PCR NEGATIVE NEGATIVE Final   Influenza B by PCR NEGATIVE NEGATIVE Final    Comment: (NOTE) The Xpert Xpress SARS-CoV-2/FLU/RSV plus assay is  intended as an aid in the diagnosis of influenza from Nasopharyngeal swab specimens and should not be used as a sole basis for treatment. Nasal washings and aspirates are unacceptable for Xpert Xpress SARS-CoV-2/FLU/RSV testing.  Fact Sheet for Patients: BloggerCourse.com  Fact Sheet for Healthcare Providers: SeriousBroker.it  This test is not yet approved or cleared by the [place] FDA and has been authorized for detection and/or diagnosis of SARS-CoV-2 by FDA under an Emergency Use Authorization (EUA). This EUA will remain in effect (meaning this test can be used) for the duration of the COVID-19 declaration under Section 564(b)(1) of the Act, 21 U.S.C. section 360bbb-3(b)(1), unless the authorization  is terminated or revoked.  Performed at [facility], [address] [place], [place] [address]   MRSA Next Gen by PCR, Nasal     Status: None   Collection Time: 02/22/22 10:40 PM   Specimen: Nasal Mucosa; Nasal Swab  Result Value Ref Range Status   MRSA by PCR Next Gen NOT DETECTED NOT DETECTED Final    Comment: (NOTE) The GeneXpert MRSA Assay (FDA approved for NASAL specimens only), is one component of a comprehensive MRSA colonization surveillance program. It is not intended to diagnose MRSA infection nor to guide or monitor treatment for MRSA infections. Test performance is not FDA approved in patients less than 55 years old. Performed at [facility], [address] [place], [place] [address]      Radiology Studies: CT Head Wo Contrast  Result Date: 02/22/2022 CLINICAL DATA:  Mental status change, unknown cause, unclear if head injury. Fall today. Generalized weakness. EXAM: CT HEAD WITHOUT CONTRAST TECHNIQUE: Contiguous axial images were obtained from the base of the skull through the vertex without intravenous contrast. RADIATION DOSE REDUCTION: This exam was performed according to the  departmental dose-optimization program which includes automated exposure control, adjustment of the mA and/or kV according to patient size and/or use of iterative reconstruction technique. COMPARISON:  Prior head CT examinations 11/22/2021 and earlier. FINDINGS: Brain: Mild generalized cerebral and cerebellar atrophy. A subdural collection overlying the right cerebral hemisphere has increased in size as compared to the head CT of 11/22/2021 (now measuring up to 12 mm in greatest thickness). This is compatible with a hematoma. The majority of the collection is low to intermediate density. However, new from the prior head CT, there is small volume acute hemorrhage within the collection (for instance as seen on series 2, image 19). Mild mass effect upon the underlying right cerebral hemisphere without midline shift. Additionally, there is small volume acute hemorrhage overlying the anterolateral aspect of the right temporal lobe, which may be subdural or subarachnoid (series 2, image 7). Mild patchy and ill-defined hypoattenuation within the cerebral white matter, nonspecific but compatible chronic small vessel ischemic disease No demarcated cortical infarct. No evidence of an intracranial mass. Vascular: No hyperdense vessel.  Atherosclerotic calcifications. Skull: Normal. Negative for fracture or focal lesion. Sinuses/Orbits: Visualized orbits show no acute finding. Mild mucosal thickening within the left maxillary sinus at the imaged levels. These results were called by telephone at the time of interpretation on 02/22/2022 at 6:35 pm to provider MATTHEW TRIFAN , who verbally acknowledged these results. IMPRESSION: A subdural collection overlying the right cerebral hemisphere has increased in size since the prior head CT of 11/22/2021 (now measuring up to 12 mm in greatest thickness). This is compatible with a subdural hematoma. The majority of the collection is low-to-intermediate density. However, new from the prior  head CT, there is small-volume hyperdense acute hemorrhage within portions of the collection. Mild mass effect upon the underlying right cerebral hemisphere without midline shift. Additionally, there is small-volume acute hemorrhage overlying the anterolateral aspect of the right temporal lobe, which may be subdural or subarachnoid. Mild chronic small vessel ischemic changes within the cerebral white matter. Mild general parenchymal atrophy. Mild mucosal thickening within the left maxillary sinus at the imaged levels. Electronically Signed   By: Jackey LogeKyle  Golden D.O.   On: 02/22/2022 18:39     Pamella Pertostin Dhruvi Crenshaw, MD, PhD [facility]  Between 7 am - 7 pm I am available, please contact me via Amion (for emergencies) or Securechat (  non urgent messages)  Between 7 pm - 7 am I am not available, please contact night coverage MD/APP via Amion

## 2022-02-23 NOTE — Consult Note (Signed)
Reason for Consult: Subdural hematoma Referring Physician: Dr. Lenard Simmer Lebrun is an 56 y.o. female.  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  Also has some significant liver disease and has had metabolic encephalopathy in the past.  She apparently has had a number of falls also.  Back in December it was noted that she had a small subdural hematoma on the right side that appeared chronic in nature.  There is no shift or mass effect as there was considerable cerebral atrophy.  The patient came to the hospital today after an apparent fall and a CT scan demonstrates the presence of a slight increase in size of the subdural collection on the right side with subacute and chronic blood in the layered fashion on that right side.  Still there is no significant mass effect from the subdural and there is certainly no shift.  The patient has been confused and is a chronic alcoholic and apparently has been drinking of the recent past.  Clearly her metabolic issues supersede anything to do with the subdural hematoma.  Past Medical History:  Diagnosis Date   Alcohol abuse    Allergy    Anemia    low iron   Anxiety    Asthma    Asthma    [facility] 11/09/2020   Depression    posibly bipolar disorder. 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   Hypertension    never on meds    Pneumonia    at 13 years   Seizure ([place]) 02/09/2021   Seizure ([place])    Substance abuse ([place])    [place] 11/09/2020    Past Surgical History:  Procedure Laterality Date   benign cyst     removed from left cheek   RADIOLOGY WITH ANESTHESIA N/A 04/15/2021   Procedure: MRI/CT OF ABDOMEN WITH ANESTHESIA;  Surgeon: Radiologist, Medication, MD;  Location: [facility];  Service: Radiology;  Laterality: N/A;    TONSILECTOMY, ADENOIDECTOMY, BILATERAL MYRINGOTOMY AND TUBES      Family History  Problem Relation Age of Onset   Coronary artery disease Other        1st degree relative female <50   Depression Other        Family Hx   Diabetes Other        Family Hx 1st degree relative   Ovarian cancer Other        Family Hx    Healthy Mother    Diabetes Father    Gout Father    Hypertension Father    Heart failure Father    Cancer Father    Heart attack Father     Social History:  reports that she has been smoking cigarettes. She has been smoking an average of .25 packs per day. She has never used smokeless tobacco. She reports current alcohol use. She reports that she does not use drugs.  Allergies:  Allergies  Allergen Reactions   Acetaminophen Other (See Comments)    Makes LFT get elevated and causes liver problems/issues    Lithium Other (See Comments)    Causes the patient to have no appetite   Promethazine-Codeine Nausea And Vomiting and Other (See Comments)    Any codeine-based cough syrups cause N/V   Codeine Nausea And Vomiting  Morphine And Related Nausea And Vomiting    Medications: I have reviewed the patient's current medications.  Results for orders placed or performed during the hospital encounter of 02/22/22 (from the past 48 hour(s))  Protime-INR     Status: Abnormal   Collection Time: 02/22/22  5:50 PM  Result Value Ref Range   Prothrombin Time 15.3 (H) 11.4 - 15.2 seconds   INR 1.2 0.8 - 1.2    Comment: (NOTE) INR goal varies based on device and disease states. Performed at [facility], [place] [address] [place], [place] [address]   Comprehensive metabolic panel     Status: Abnormal   Collection Time: 02/22/22  6:00 PM  Result Value Ref Range   Sodium 129 (L) 135 - 145 mmol/L   Potassium 3.0 (L) 3.5 - 5.1 mmol/L   Chloride 88 (L) 98 - 111 mmol/L   CO2 22 22 - 32 mmol/L   Glucose, Bld 95 70 - 99 mg/dL    Comment: Glucose reference range  applies only to samples taken after fasting for at least 8 hours.   BUN 9 6 - 20 mg/dL   Creatinine, Ser 0.40 (L) 0.44 - 1.00 mg/dL   Calcium 7.6 (L) 8.9 - 10.3 mg/dL   Total Protein 5.1 (L) 6.5 - 8.1 g/dL   Albumin 2.8 (L) 3.5 - 5.0 g/dL   AST 167 (H) 15 - 41 U/L   ALT 70 (H) 0 - 44 U/L   Alkaline Phosphatase 358 (H) 38 - 126 U/L   Total Bilirubin 1.8 (H) 0.3 - 1.2 mg/dL   GFR, Estimated >60 >60 mL/min    Comment: (NOTE) Calculated using the CKD-EPI Creatinine Equation (2021)    Anion gap 19 (H) 5 - 15    Comment: Performed at [facility], [place] [address], [place], [place] [address]  CBC with Differential     Status: Abnormal   Collection Time: 02/22/22  6:00 PM  Result Value Ref Range   WBC 10.5 4.0 - 10.5 K/uL   RBC 2.92 (L) 3.87 - 5.11 MIL/uL   Hemoglobin 9.8 (L) 12.0 - 15.0 g/dL   HCT 29.5 (L) 36.0 - 46.0 %   MCV 101.0 (H) 80.0 - 100.0 fL   MCH 33.6 26.0 - 34.0 pg   MCHC 33.2 30.0 - 36.0 g/dL   RDW 13.6 11.5 - 15.5 %   Platelets 261 150 - 400 K/uL   nRBC 0.0 0.0 - 0.2 %   Neutrophils Relative % 75 %   Neutro Abs 7.9 (H) 1.7 - 7.7 K/uL   Lymphocytes Relative 5 %   Lymphs Abs 0.6 (L) 0.7 - 4.0 K/uL   Monocytes Relative 12 %   Monocytes Absolute 1.2 (H) 0.1 - 1.0 K/uL   Eosinophils Relative 3 %   Eosinophils Absolute 0.3 0.0 - 0.5 K/uL   Basophils Relative 1 %   Basophils Absolute 0.1 0.0 - 0.1 K/uL   Immature Granulocytes 4 %   Abs Immature Granulocytes 0.41 (H) 0.00 - 0.07 K/uL    Comment: Performed at [facility], [place] [address] [place], [name] [address]  Lipase, blood     Status: None   Collection Time: 02/22/22  6:00 PM  Result Value Ref Range   Lipase 19 11 - 51 U/L    Comment: Performed at [facility], [place] [address], [place], [name] [address]  Troponin I (High Sensitivity)     Status: Abnormal   Collection Time:  02/22/22  6:00 PM  Result Value Ref Range   Troponin I (High Sensitivity) 21 (H) <18  ng/L    Comment: (NOTE) Elevated high sensitivity troponin I (hsTnI) values and significant  changes across serial measurements may suggest ACS but many other  chronic and acute conditions are known to elevate hsTnI results.  Refer to the "Links" section for chest pain algorithms and additional  guidance. Performed at [facility], [place] [address] [place], [place] [address]   Ethanol     Status: None   Collection Time: 02/22/22  6:00 PM  Result Value Ref Range   Alcohol, Ethyl (B) <10 <10 mg/dL    Comment: (NOTE) Lowest detectable limit for serum alcohol is 10 mg/dL.  For medical purposes only. Performed at [facility], [place] [address] [place], [place] [address]   Magnesium     Status: Abnormal   Collection Time: 02/22/22  6:00 PM  Result Value Ref Range   Magnesium 1.3 (L) 1.7 - 2.4 mg/dL    Comment: Performed at [facility], [place] [address], [place], [place] [address]  Ammonia     Status: Abnormal   Collection Time: 02/22/22  6:00 PM  Result Value Ref Range   Ammonia 41 (H) 9 - 35 umol/L    Comment: Performed at [facility], [place] [address] [place], [place] [address]  Resp Panel by RT-PCR (Flu A&B, Covid) Nasopharyngeal Swab     Status: None   Collection Time: 02/22/22  6:02 PM   Specimen: Nasopharyngeal Swab; Nasopharyngeal(NP) swabs in vial transport medium  Result Value Ref Range   SARS Coronavirus 2 by RT PCR NEGATIVE NEGATIVE    Comment: (NOTE) SARS-CoV-2 target nucleic acids are NOT DETECTED.  The SARS-CoV-2 RNA is generally detectable in upper respiratory specimens during the acute phase of infection. The lowest concentration of SARS-CoV-2 viral copies this assay can detect is 138 copies/mL. A negative result does not preclude SARS-Cov-2 infection and should not be used as the sole basis for treatment or other patient management decisions. A negative result may occur with  improper  specimen collection/handling, submission of specimen other than nasopharyngeal swab, presence of viral mutation(s) within the areas targeted by this assay, and inadequate number of viral copies(<138 copies/mL). A negative result must be combined with clinical observations, patient history, and epidemiological information. The expected result is Negative.  Fact Sheet for Patients:  EntrepreneurPulse.com.au  Fact Sheet for Healthcare Providers:  IncredibleEmployment.be  This test is no t yet approved or cleared by the [place] FDA and  has been authorized for detection and/or diagnosis of SARS-CoV-2 by FDA under an Emergency Use Authorization (EUA). This EUA will remain  in effect (meaning this test can be used) for the duration of the COVID-19 declaration under Section 564(b)(1) of the Act, 21 U.S.C.section 360bbb-3(b)(1), unless the authorization is terminated  or revoked sooner.       Influenza A by PCR NEGATIVE NEGATIVE   Influenza B by PCR NEGATIVE NEGATIVE    Comment: (NOTE) The Xpert Xpress SARS-CoV-2/FLU/RSV plus assay is intended as an aid in the diagnosis of influenza from Nasopharyngeal swab specimens and should not be used as a sole basis for treatment. Nasal washings and aspirates are unacceptable for Xpert Xpress SARS-CoV-2/FLU/RSV testing.  Fact Sheet for Patients: EntrepreneurPulse.com.au  Fact Sheet for Healthcare Providers: IncredibleEmployment.be  This test is not yet approved or cleared by the [place] FDA and has been authorized for detection and/or  diagnosis of SARS-CoV-2 by FDA under an Emergency Use Authorization (EUA). This EUA will remain in effect (meaning this test can be used) for the duration of the COVID-19 declaration under Section 564(b)(1) of the Act, 21 U.S.C. section 360bbb-3(b)(1), unless the authorization is terminated or revoked.  Performed at [facility], [place] [address], [place], [place] [address]   Glucose, capillary     Status: Abnormal   Collection Time: 02/22/22  9:55 PM  Result Value Ref Range   Glucose-Capillary 116 (H) 70 - 99 mg/dL    Comment: Glucose reference range applies only to samples taken after fasting for at least 8 hours.  HIV Antibody (routine testing w rflx)     Status: None   Collection Time: 02/22/22 10:38 PM  Result Value Ref Range   HIV Screen 4th Generation wRfx Non Reactive Non Reactive    Comment: Performed at [facility], [place] [address] [place], [place] [address]  Magnesium     Status: None   Collection Time: 02/22/22 10:38 PM  Result Value Ref Range   Magnesium 2.0 1.7 - 2.4 mg/dL    Comment: Performed at [facility], [place] [address], [place], [place] [address]  Comprehensive metabolic panel     Status: Abnormal   Collection Time: 02/22/22 10:38 PM  Result Value Ref Range   Sodium 129 (L) 135 - 145 mmol/L   Potassium 3.1 (L) 3.5 - 5.1 mmol/L   Chloride 93 (L) 98 - 111 mmol/L   CO2 23 22 - 32 mmol/L   Glucose, Bld 114 (H) 70 - 99 mg/dL    Comment: Glucose reference range applies only to samples taken after fasting for at least 8 hours.   BUN 7 6 - 20 mg/dL   Creatinine, Ser 0.70 0.44 - 1.00 mg/dL   Calcium 7.3 (L) 8.9 - 10.3 mg/dL   Total Protein 4.5 (L) 6.5 - 8.1 g/dL   Albumin 2.3 (L) 3.5 - 5.0 g/dL   AST 157 (H) 15 - 41 U/L   ALT 66 (H) 0 - 44 U/L   Alkaline Phosphatase 327 (H) 38 - 126 U/L   Total Bilirubin 1.7 (H) 0.3 - 1.2 mg/dL   GFR, Estimated >60 >60 mL/min    Comment: (NOTE) Calculated using the CKD-EPI Creatinine Equation (2021)    Anion gap 13 5 - 15    Comment: Performed at [facility], [place] [address], [place], [place] [address]  CBC     Status: Abnormal   Collection Time: 02/22/22 10:38 PM  Result Value Ref Range   WBC 9.9 4.0 - 10.5 K/uL   RBC 2.72 (L) 3.87 - 5.11 MIL/uL   Hemoglobin 9.3 (L) 12.0 - 15.0 g/dL   HCT 27.5 (L) 36.0 - 46.0 %    MCV 101.1 (H) 80.0 - 100.0 fL   MCH 34.2 (H) 26.0 - 34.0 pg   MCHC 33.8 30.0 - 36.0 g/dL   RDW 13.6 11.5 - 15.5 %   Platelets 256 150 - 400 K/uL   nRBC 0.0 0.0 - 0.2 %    Comment: Performed at [place] [address] [place], [place] [address]  Protime-INR     Status: Abnormal   Collection Time: 02/22/22 10:38 PM  Result Value Ref Range   Prothrombin Time 16.2 (H) 11.4 - 15.2 seconds   INR 1.3 (H) 0.8 - 1.2    Comment: (NOTE) INR goal varies based on device and disease states.  Performed at [facility], [place] [address], [place], [place] [address]   MRSA Next Gen by PCR, Nasal     Status: None   Collection Time: 02/22/22 10:40 PM   Specimen: Nasal Mucosa; Nasal Swab  Result Value Ref Range   MRSA by PCR Next Gen NOT DETECTED NOT DETECTED    Comment: (NOTE) The GeneXpert MRSA Assay (FDA approved for NASAL specimens only), is one component of a comprehensive MRSA colonization surveillance program. It is not intended to diagnose MRSA infection nor to guide or monitor treatment for MRSA infections. Test performance is not FDA approved in patients less than 48 years old. Performed at [facility], [place] [address], [place], [place] [address]     CT Head Wo Contrast  Result Date: 02/22/2022 CLINICAL DATA:  Mental status change, unknown cause, unclear if head injury. Fall today. Generalized weakness. EXAM: CT HEAD WITHOUT CONTRAST TECHNIQUE: Contiguous axial images were obtained from the base of the skull through the vertex without intravenous contrast. RADIATION DOSE REDUCTION: This exam was performed according to the departmental dose-optimization program which includes automated exposure control, adjustment of the mA and/or kV according to patient size and/or use of iterative reconstruction technique. COMPARISON:  Prior head CT examinations 11/22/2021 and earlier. FINDINGS: Brain: Mild generalized cerebral and cerebellar atrophy. A subdural collection overlying  the right cerebral hemisphere has increased in size as compared to the head CT of 11/22/2021 (now measuring up to 12 mm in greatest thickness). This is compatible with a hematoma. The majority of the collection is low to intermediate density. However, new from the prior head CT, there is small volume acute hemorrhage within the collection (for instance as seen on series 2, image 19). Mild mass effect upon the underlying right cerebral hemisphere without midline shift. Additionally, there is small volume acute hemorrhage overlying the anterolateral aspect of the right temporal lobe, which may be subdural or subarachnoid (series 2, image 7). Mild patchy and ill-defined hypoattenuation within the cerebral white matter, nonspecific but compatible chronic small vessel ischemic disease No demarcated cortical infarct. No evidence of an intracranial mass. Vascular: No hyperdense vessel.  Atherosclerotic calcifications. Skull: Normal. Negative for fracture or focal lesion. Sinuses/Orbits: Visualized orbits show no acute finding. Mild mucosal thickening within the left maxillary sinus at the imaged levels. These results were called by telephone at the time of interpretation on 02/22/2022 at 6:35 pm to provider MATTHEW TRIFAN , who verbally acknowledged these results. IMPRESSION: A subdural collection overlying the right cerebral hemisphere has increased in size since the prior head CT of 11/22/2021 (now measuring up to 12 mm in greatest thickness). This is compatible with a subdural hematoma. The majority of the collection is low-to-intermediate density. However, new from the prior head CT, there is small-volume hyperdense acute hemorrhage within portions of the collection. Mild mass effect upon the underlying right cerebral hemisphere without midline shift. Additionally, there is small-volume acute hemorrhage overlying the anterolateral aspect of the right temporal lobe, which may be subdural or subarachnoid. Mild chronic  small vessel ischemic changes within the cerebral white matter. Mild general parenchymal atrophy. Mild mucosal thickening within the left maxillary sinus at the imaged levels. Electronically Signed   By: Kellie Simmering D.O.   On: 02/22/2022 18:39   DG CHEST PORT 1 VIEW  Result Date: 02/23/2022 CLINICAL DATA:  Fever. EXAM: PORTABLE CHEST 1 VIEW COMPARISON:  CT chest dated November 22, 2021. Chest x-ray dated August 26, 2020. FINDINGS: The heart size and  mediastinal contours are within normal limits. Mild patchy opacity at both lung bases. No pleural effusion or pneumothorax. Chronic blunting/scarring at the left costophrenic angle. No acute osseous abnormality. IMPRESSION: 1. Bibasilar atelectasis versus pneumonia. Electronically Signed   By: Titus Dubin M.D.   On: 02/23/2022 09:20   US Abdomen Limited RUQ (LIVER/GB)  Result Date: 02/23/2022 CLINICAL DATA:  Elevated liver function tests EXAM: ULTRASOUND ABDOMEN LIMITED RIGHT UPPER QUADRANT COMPARISON:  03/18/2021, 04/15/2021 FINDINGS: Gallbladder: Gallbladder wall thickness of 3.8 mm. No gallstones or pericholecystic fluid visualized. A positive sonographic Percell Miller sign was noted by sonographer. Common bile duct: Diameter: 7 mm. Liver: No focal lesion identified. Diffusely increased hepatic parenchymal echogenicity. Portal vein is patent on color Doppler imaging with normal direction of blood flow towards the liver. Other: None. IMPRESSION: 1. Mild gallbladder wall thickening with positive sonographic Murphy sign on exam. No biliary sludge or gallstones. Findings raise the possibility of acalculous cholecystitis. If further imaging evaluation is clinically warranted, a nuclear medicine hepatobiliary scan could be obtained to assess the patency of the cystic duct and common bile duct. 2. Hepatic steatosis. Electronically Signed   By: Davina Poke D.O.   On: 02/23/2022 10:37    Review of Systems  Unable to perform ROS: Dementia  Blood pressure 130/64,  pulse 96, temperature (!) 100.6 F (38.1 C), temperature source Oral, resp. rate 19, height 5' (1.524 m), weight 38.6 kg, last menstrual period 11/04/2013, SpO2 98 %. Physical Exam Constitutional:      Comments: Arousable to voice and follows commands upon doing so oriented to hospital but not the day or the date.  HENT:     Head: Normocephalic and atraumatic.     Right Ear: Tympanic membrane normal.     Left Ear: Tympanic membrane normal.     Nose: Nose normal.     Mouth/Throat:     Mouth: Mucous membranes are moist.  Eyes:     Extraocular Movements: Extraocular movements intact.     Conjunctiva/sclera: Conjunctivae normal.     Pupils: Pupils are equal, round, and reactive to light.  Pulmonary:     Effort: Pulmonary effort is normal.     Breath sounds: Normal breath sounds.  Abdominal:     General: Abdomen is flat.     Palpations: Abdomen is soft.  Skin:    General: Skin is warm.     Capillary Refill: Capillary refill takes less than 2 seconds.  Neurological:     General: No focal deficit present.  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  The subdural is not likely impacting the patient's neurologic function at this time.  This should be observed.  Repeat CT scan in a month's time would be appropriate and I can follow this patient up either as an outpatient or if still institutionalized.  Blanchie Dessert Jaylon Boylen 02/23/2022, 6:08 PM

## 2022-02-24 ENCOUNTER — Inpatient Hospital Stay (HOSPITAL_COMMUNITY): Payer: No Typology Code available for payment source

## 2022-02-24 DIAGNOSIS — G43709 Chronic migraine without aura, not intractable, without status migrainosus: Secondary | ICD-10-CM | POA: Diagnosis not present

## 2022-02-24 DIAGNOSIS — I6201 Nontraumatic acute subdural hemorrhage: Secondary | ICD-10-CM | POA: Diagnosis not present

## 2022-02-24 DIAGNOSIS — F418 Other specified anxiety disorders: Secondary | ICD-10-CM | POA: Diagnosis not present

## 2022-02-24 DIAGNOSIS — F1029 Alcohol dependence with unspecified alcohol-induced disorder: Secondary | ICD-10-CM | POA: Diagnosis not present

## 2022-02-24 LAB — LAMOTRIGINE LEVEL: Lamotrigine Lvl: 13.6 ug/mL (ref 2.0–20.0)

## 2022-02-24 LAB — COMPREHENSIVE METABOLIC PANEL
ALT: 90 U/L — ABNORMAL HIGH (ref 0–44)
AST: 230 U/L — ABNORMAL HIGH (ref 15–41)
Albumin: 2.2 g/dL — ABNORMAL LOW (ref 3.5–5.0)
Alkaline Phosphatase: 446 U/L — ABNORMAL HIGH (ref 38–126)
Anion gap: 12 (ref 5–15)
BUN: <5 mg/dL — ABNORMAL LOW (ref 6–20)
CO2: 21 mmol/L — ABNORMAL LOW (ref 22–32)
Calcium: 7.5 mg/dL — ABNORMAL LOW (ref 8.9–10.3)
Chloride: 100 mmol/L (ref 98–111)
Creatinine, Ser: 0.56 mg/dL (ref 0.44–1.00)
GFR, Estimated: >60 mL/min (ref >60)
Glucose, Bld: 84 mg/dL (ref 70–99)
Potassium: 5 mmol/L (ref 3.5–5.1)
Sodium: 133 mmol/L — ABNORMAL LOW (ref 135–145)
Total Bilirubin: 1.7 mg/dL — ABNORMAL HIGH (ref 0.3–1.2)
Total Protein: 4.2 g/dL — ABNORMAL LOW (ref 6.5–8.1)

## 2022-02-24 LAB — CBC
HCT: 28.9 % — ABNORMAL LOW (ref 36.0–46.0)
Hemoglobin: 9.8 g/dL — ABNORMAL LOW (ref 12.0–15.0)
MCH: 34 pg (ref 26.0–34.0)
MCHC: 33.9 g/dL (ref 30.0–36.0)
MCV: 100.3 fL — ABNORMAL HIGH (ref 80.0–100.0)
Platelets: 206 10*3/uL (ref 150–400)
RBC: 2.88 MIL/uL — ABNORMAL LOW (ref 3.87–5.11)
RDW: 13.8 % (ref 11.5–15.5)
WBC: 7.4 10*3/uL (ref 4.0–10.5)
nRBC: 0 % (ref 0.0–0.2)

## 2022-02-24 LAB — PHOSPHORUS: Phosphorus: 1.8 mg/dL — ABNORMAL LOW (ref 2.5–4.6)

## 2022-02-24 LAB — MAGNESIUM: Magnesium: 1.3 mg/dL — ABNORMAL LOW (ref 1.7–2.4)

## 2022-02-24 MED ORDER — DEXTROSE 5 % IV SOLN
15.0000 mmol | Freq: Once | INTRAVENOUS | Status: AC
  Administered 2022-02-24: 15 mmol via INTRAVENOUS
  Filled 2022-02-24: qty 5

## 2022-02-24 MED ORDER — TECHNETIUM TC 99M MEBROFENIN IV KIT
5.0000 | PACK | Freq: Once | INTRAVENOUS | Status: AC | PRN
  Administered 2022-02-24: 5 via INTRAVENOUS

## 2022-02-24 MED ORDER — ENSURE ENLIVE PO LIQD: 237.0000 mL | Freq: Two times a day (BID) | ORAL | Status: DC

## 2022-02-24 MED ORDER — MAGNESIUM SULFATE 2 GM/50ML IV SOLN
2.0000 g | Freq: Once | INTRAVENOUS | Status: AC
  Administered 2022-02-24: 2 g via INTRAVENOUS
  Filled 2022-02-24: qty 50

## 2022-02-24 NOTE — Progress Notes (Signed)
SLP Cancellation Note ? ?Patient Details ?Name: Miwa Vanegas ?MRN: [id number] ?DOB: 1966-03-14 ? ? ?Cancelled treatment:       Reason Eval/Treat Not Completed: Patient at procedure or test/unavailable. Pt NPO fro procedure this am. Rescheduled MBS for 1300 today.  ? ? ?Cap Massi, Katherene Ponto ?02/24/2022, 9:18 AM ?

## 2022-02-24 NOTE — Progress Notes (Signed)
Made NPO at midnight and no pian medication given for HIDA today. ?

## 2022-02-24 NOTE — Evaluation (Signed)
Physical Therapy Evaluation ?Patient Details ?Name: Kelly Zuniga ?MRN: [id number] ?DOB: 29-Sep-1966 ?Today's Date: 02/24/2022 ? ?History of Present Illness ? 56 yo female presents to [facility] on 3/5 with fall OOB with seizure activity, weakness, + head trauma. CT head shows increased size of right cerebral hemisphere subdural hematoma.  New small volume of acute hemorrhage changes seen within this collection and overlying the anterior lateral aspect of the right temporal lobe. PMH includes chronic alcohol use with suspected Wernicke's encephalopathy associated with gait abnormality, chronic migraines and seizures, macrocytic anemia, depression/anxiety, and chronic deconditioning/FTT.  ?Clinical Impression ?  ?Pt presents with generalized weakness, truncal and limb ataxia, severely impaired sitting and standing balance with preference for posterior leaning and history of falling, max difficulty mobilizing, and decreased activity tolerance vs baseline. Pt to benefit from acute PT to address deficits. Pt requiring mod-max assist for bed mobility and transfer OOB, pt with heavy posterior leaning difficult to correct even with max facilitation and multimodal cuing. Pt and family express wishes for pt to d/c home, would recommend maximizing home services given current mobility needs and will need constant supervision. PT to progress mobility as tolerated, and will continue to follow acutely.  ?   ?   ? ?Recommendations for follow up therapy are one component of a multi-disciplinary discharge planning process, led by the attending physician.  Recommendations may be updated based on patient status, additional functional criteria and insurance authorization. ? ?Follow Up Recommendations Skilled nursing-short term rehab (<3 hours/day) (pt and family decline, wish pt to return home. Therefore, HHPT) ? ?  ?Assistance Recommended at Discharge Frequent or constant Supervision/Assistance  ?Patient can return home with the following ? A  lot of help with walking and/or transfers;A lot of help with bathing/dressing/bathroom;Direct supervision/assist for medications management;Assistance with cooking/housework;Assist for transportation;Help with stairs or ramp for entrance;Assistance with feeding;Direct supervision/assist for financial management ? ?  ?Equipment Recommendations Wheelchair (measurements PT);Wheelchair cushion (measurements PT)  ?Recommendations for Other Services ?    ?  ?Functional Status Assessment Patient has had a recent decline in their functional status and/or demonstrates limited ability to make significant improvements in function in a reasonable and predictable amount of time  ? ?  ?Precautions / Restrictions Precautions ?Precautions: Fall ?Restrictions ?Weight Bearing Restrictions: No  ? ?  ? ?Mobility ? Bed Mobility ?Overal bed mobility: Needs Assistance ?Bed Mobility: Supine to Sit ?  ?  ?Supine to sit: Mod assist, HOB elevated ?  ?  ?General bed mobility comments: mod assist for trunk elevation, LE lowering over EOB, scooting to EOB. ?  ? ?Transfers ?Overall transfer level: Needs assistance ?Equipment used: 1 person hand held assist ?Transfers: Sit to/from Stand, Bed to chair/wheelchair/BSC ?Sit to Stand: Max assist ?  ?Step pivot transfers: Max assist ?  ?  ?  ?General transfer comment: max assist for trunk elevation, steadying and safe pivot towards L as pt with HEAVY posterior leaning. ?  ? ?Ambulation/Gait ?  ?  ?  ?  ?  ?  ?  ?General Gait Details: unable to attempt ? ?Stairs ?  ?  ?  ?  ?  ? ?Wheelchair Mobility ?  ? ?Modified Rankin (Stroke Patients Only) ?Modified Rankin (Stroke Patients Only) ?Pre-Morbid Rankin Score: Moderately severe disability ?Modified Rankin: Severe disability ? ?  ? ?Balance Overall balance assessment: Needs assistance, History of Falls ?Sitting-balance support: No upper extremity supported, Feet supported ?Sitting balance-Leahy Scale: Poor ?Sitting balance - Comments: at least mod posterior  truncal assist to maintain  upright sitting ?Postural control: Posterior lean ?Standing balance support: Bilateral upper extremity supported, During functional activity ?Standing balance-Leahy Scale: Zero ?Standing balance comment: max assist for stand and pivot ?  ?  ?  ?  ?  ?  ?  ?  ?  ?  ?  ?   ? ? ? ?Pertinent Vitals/Pain Pain Assessment ?Pain Assessment: No/denies pain  ? ? ?Home Living Family/patient expects to be discharged to:: Private residence ?Living Arrangements: Spouse/significant other;Children (boyfriend and 2 kids) ?Available Help at Discharge: Family;Available 24 hours/day ?Type of Home: House ?Home Access: Ramped entrance ?  ?  ?Alternate Level Stairs-Number of Steps: flight ?Home Layout: Two level;1/2 bath on main level ?Home Equipment: Conservation officer, nature (2 wheels);Cane - single point;Grab bars - tub/shower ?   ?  ?Prior Function Prior Level of Function : Independent/Modified Independent ?  ?  ?  ?  ?  ?  ?Mobility Comments: uses a walker, per pt's mother in room pt requires assist for all mobility at baseline ?ADLs Comments: needs help with all ADLs ?  ? ? ?Hand Dominance  ? Dominant Hand: Right ? ?  ?Extremity/Trunk Assessment  ? Upper Extremity Assessment ?Upper Extremity Assessment: Defer to OT evaluation ?  ? ?Lower Extremity Assessment ?Lower Extremity Assessment: Generalized weakness (able to perform full AROM heel slides, SLR with min quad lag) ?  ? ?Cervical / Trunk Assessment ?Cervical / Trunk Assessment: Kyphotic  ?Communication  ? Communication: No difficulties  ?Cognition Arousal/Alertness: Awake/alert ?Behavior During Therapy: Flat affect ?Overall Cognitive Status: History of cognitive impairments - at baseline ?  ?  ?  ?  ?  ?  ?  ?  ?  ?  ?  ?  ?  ?  ?  ?  ?General Comments: pt A&Ox4, very flat affect and lacks insight into deficits ?  ?  ? ?  ?General Comments   ? ?  ?Exercises    ? ?Assessment/Plan  ?  ?PT Assessment Patient needs continued PT services  ?PT Problem List Decreased  strength;Decreased mobility;Decreased safety awareness;Decreased knowledge of precautions;Decreased balance;Decreased knowledge of use of DME;Decreased activity tolerance;Decreased coordination ? ?   ?  ?PT Treatment Interventions DME instruction;Gait training;Therapeutic exercise;Patient/family education;Therapeutic activities;Balance training;Stair training;Functional mobility training;Neuromuscular re-education   ? ?PT Goals (Current goals can be found in the Care Plan section)  ?Acute Rehab PT Goals ?Patient Stated Goal: home ?PT Goal Formulation: With patient/family ?Time For Goal Achievement: 03/10/22 ?Potential to Achieve Goals: Fair ? ?  ?Frequency Min 4X/week ?  ? ? ?Co-evaluation   ?  ?  ?  ?  ? ? ?  ?AM-PAC PT "6 Clicks" Mobility  ?Outcome Measure Help needed turning from your back to your side while in a flat bed without using bedrails?: A Lot ?Help needed moving from lying on your back to sitting on the side of a flat bed without using bedrails?: A Lot ?Help needed moving to and from a bed to a chair (including a wheelchair)?: A Lot ?Help needed standing up from a chair using your arms (e.g., wheelchair or bedside chair)?: A Lot ?Help needed to walk in hospital room?: Total ?Help needed climbing 3-5 steps with a railing? : Total ?6 Click Score: 10 ? ?  ?End of Session   ?Activity Tolerance: Patient limited by fatigue ?Patient left: in chair;with call bell/phone within reach;with chair alarm set;with family/visitor present;Other (comment) (pt's mother present and will be present for remainder of pt time in chair, posey belt chair  alarm utilized for pt safety) ?Nurse Communication: Mobility status ?PT Visit Diagnosis: Other abnormalities of gait and mobility (R26.89);Ataxic gait (R26.0) ?  ? ?Time: [id number] ?PT Time Calculation (min) (ACUTE ONLY): 28 min ? ? ?Charges:   PT Evaluation ?$PT Eval Low Complexity: 1 Low ?PT Treatments ?$Therapeutic Activity: 8-22 mins ?  ?   ? ? ?Stacie Glaze, PT DPT ?Acute  Rehabilitation Services ?Pager [phone]  ?Office [phone] ? ? ?Shekina Cordell E Stroup ?02/24/2022, 4:07 PM ? ?

## 2022-02-24 NOTE — Progress Notes (Signed)
Initial Nutrition Assessment ? ?DOCUMENTATION CODES:  ? ?Severe malnutrition in context of chronic illness, Underweight ? ?INTERVENTION:  ?- Encourage adequate PO intake ?- Ensure Enlive po BID, each supplement provides 350 kcal and 20 grams of protein (pt prefers chocolate or vanilla) ?- Continue MVI with minerals ? ?NUTRITION DIAGNOSIS:  ? ?Severe Malnutrition related to chronic illness (ETOH abuse, FTT, Wernicke's encephalopathy) as evidenced by energy intake < or equal to 75% for > or equal to 1 month, moderate fat depletion, severe muscle depletion. ? ?GOAL:  ? ?Patient will meet greater than or equal to 90% of their needs ? ?MONITOR:  ? ?PO intake, Diet advancement, Labs, Weight trends ? ?REASON FOR ASSESSMENT:  ? ?Consult ?Assessment of nutrition requirement/status ? ?ASSESSMENT:  ? ?Pt admitted from home with headache secondary to acute on chronic intracranial subdural hematoma. PMH includes chronic alcohol use with suspected Wernicke's encephalopathy associated with gait abdnormality, chronic migraines and seizure-like activity, macrocytic anemia, depression, anxiety and chronic deconditioning with FTT. ? ?Pt in bed, expressed frustrations over NPO status. MBS performed this afternoon. ST recommend dysphagia 2 diet. Pt reports PTA eating 1-2 meals daily consisting of rice and beans. She endorses drinking water, sweet tea and alcohol intake, although did not quantify how quantity and frequency. Per chart review, pt's mother reported ~4-5 drinks of bourbon daily. ? ?Pt reports a weight of 108 lbs at her doctor's appointment last Monday. Noted a current weight of 85 lbs. Unable to confirm this weight loss as documented weight on 2/28 is 86 lbs. Documented weight history appears stable. ? ?Medications: folvite, Magonate, MVI, thiamine, IV magnesium sulfate, sodium phosphate ? ?Labs: sodium 133, BUN <5, Phos 1.8, Mg 1.3, alkaline phosphatase 446, AST 230, ALT 90 ? ?NUTRITION - FOCUSED PHYSICAL EXAM: ? ?Flowsheet  Row Most Recent Value  ?Orbital Region Moderate depletion  ?Upper Arm Region Severe depletion  ?Thoracic and Lumbar Region Moderate depletion  ?Buccal Region Moderate depletion  ?Temple Region Moderate depletion  ?Clavicle Bone Region Severe depletion  ?Clavicle and Acromion Bone Region Severe depletion  ?Scapular Bone Region Severe depletion  ?Dorsal Hand Severe depletion  ?Patellar Region Severe depletion  ?Anterior Thigh Region Severe depletion  ?Posterior Calf Region Severe depletion  ?Edema (RD Assessment) None  ?Hair Reviewed  ?Eyes Reviewed  ?Mouth Reviewed  ?Skin Reviewed  ?Nails Unable to assess  [painted nails]  ? ?  ? ?Diet Order:   ?Diet Order   ? ?       ?  DIET DYS 2 Room service appropriate? Yes; Fluid consistency: Thin  Diet effective now       ?  ? ?  ?  ? ?  ? ? ?EDUCATION NEEDS:  ? ?Not appropriate for education at this time ? ?Skin:  Skin Assessment: Reviewed RN Assessment ? ?Last BM:  3/5 (type 6) ? ?Height:  ? ?Ht Readings from Last 1 Encounters:  ?02/22/22 5' (1.524 m)  ? ? ?Weight:  ? ?Wt Readings from Last 1 Encounters:  ?02/22/22 38.6 kg  ? ?BMI:  Body mass index is 16.6 kg/m?. ? ?Estimated Nutritional Needs:  ? ?Kcal:  1300-1500 ? ?Protein:  65-80g ? ?Fluid:  >/=1.5L ? ?Kelly Zuniga, RDN, LDN ?Clinical Nutrition ?

## 2022-02-24 NOTE — Progress Notes (Signed)
Modified Barium Swallow Progress Note ? ?Patient Details  ?Name: Kelly Zuniga ?MRN: [id number] ?Date of Birth: [birth year] ? ?Today's Date: 02/24/2022 ? ?Modified Barium Swallow completed.  Full report located under Chart Review in the Imaging Section. ? ?Brief recommendations include the following: ? ?Clinical Impression ? Patient presents with a mild oropharyngeal dysphagia as per this MBS. During oral phase, patient exhibited mildly delayed oral transit of puree consistency and barium tablet with thin liquid barium but full clearance of oral cavity post initial swallow. During pharyngeal phase of swallow, patient exhibited very mild swallow initiation delay at level of vallecular sinus with thin liquids only. After initial swallows, patient exhibited trace-mild vallecular residual with puree solids,  trace vallecular and trace pyriform residuals with thin and nectar thick liquid barium. Patient did exhibit instances of flash penetration during the swallow (PAS 2)  with cup sips of thin liquid barium, but penetrate fully cleared laryngeal vestibule and no aspiration occured with any of the tested barium consistencies.1/2 barium tablet transited pharyngeally without difficulty and esophageal sweep was unremarkable. SLP is recommending to initiate Dys 2 (fine chop) solids and thin liquids. SLP will continue to follow patient for diet toleration. ?  ?Swallow Evaluation Recommendations ? ?   ? ? SLP Diet Recommendations: Dysphagia 2 (Fine chop) solids;Thin liquid ? ? Liquid Administration via: Cup;Straw ? ? Medication Administration: Other (Comment) (whole with thin if small, cut in half or put in puree if large) ? ?   ? ? Compensations: Slow rate;Small sips/bites ? ? Postural Changes: Seated upright at 90 degrees ? ? Oral Care Recommendations: Oral care BID ? ?   ? ? ?Angela Nevin, MA, CCC-SLP ?Speech Therapy ? ?

## 2022-02-24 NOTE — Progress Notes (Addendum)
PROGRESS NOTE  Kelly Zuniga ZTI:[id number] DOB: 10-03-1966 DOA: 02/22/2022 PCP: Kelly Agee, NP   LOS: 2 days   Brief Narrative / Interim history: Kelly Zuniga is a 56 y.o. female with medical history significant for chronic alcohol use with suspected Warnicke's encephalopathy associated with gait abnormality, chronic migraines and seizure-like activity on lamotrigine, macrocytic anemia, depression/anxiety, and chronic deconditioning with failure to thrive who is admitted with acute on chronic subdural hematoma in setting of seizure-like activity with presumed head injury.  Neurosurgery recommend admission to [facility] and would see in consultation in AM.  Placed on CIWA protocol due to high risk for alcohol withdrawal.  Subjective / 24h Interval events: -More alert this morning, seems to be more tremulous, tells me she wants to go home.  Denies any shortness of breath, denies any chest pain.  Assesement and Plan: Principal Problem:   Acute on chronic intracranial subdural hematoma (HCC) Active Problems:   Alcohol dependence (HCC)   Hyponatremia   Hypomagnesemia   Hypokalemia   Seizure-like activity (HCC)   Elevated LFTs   Macrocytic anemia   Chronic migraine w/o aura w/o status migrainosus, not intractable   Depression with anxiety   Failure to thrive in adult   Fever   Assessment and Plan: Principal problem Acute on chronic intracranial subdural hematoma -CT head on admission shows increased size of right cerebral hemisphere subdural hematoma.  New small volume of acute hemorrhage changes seen within this collection and overlying the anterior lateral aspect of the right temporal lobe.  May be related to reported trauma to head after fall at home.  She is not on any blood thinners.  She tells me that last fell was the morning prior to admission.  Neurosurgery consulted, appreciate input, for now continue conservative management.  Avoid all blood thinners. Continue  Lamictal for seizure prophylaxis  Active problems Alcohol dependence (HCC) -Patient with chronic daily alcohol use, likely 4-5 drinks bourbon daily per family.  Last drink reportedly night of 3/4.  Has history of withdrawals.  Has suspected Warnicke's encephalopathy per outpatient neurology evaluation.  Continue thiamine, folate, multivitamins.  Withdrawing a little bit more today as she is more awake than on admission  Fever-new onset 3/6, nontoxic-appearing, WBC is normal.  Obtain chest x-ray, UA, blood cultures.  She is not reliable as far as her symptoms go.  She is being monitored off antibiotics, afebrile BBC normal  Hyponatremia -Mild with sodium 129 on admission.  Likely related to chronic alcohol use and low solute diet.  Received IV fluids as she looks dehydrated, sodium improved to 133.  Discontinue fluids today  Hypokalemia -stop potassium containing fluids as potassium is 5.0 this morning.  Hypomagnesemia -continue to supplement magnesium this morning  Seizure-like activity (HCC) -Continue Lamictal, CIWA with Ativan as needed, seizure precautions.  Macrocytic anemia -Hemoglobin stable at 9.8.  Likely secondary to chronic alcohol use.  Labs 02/17/2022 showed folate 14.1 and vitamin B12 843.  Continue to monitor.  Elevated LFTs -chronic elevation in the setting of chronic EtOH. She had MRCP in April 2022 which showed dilated CBD with possible impacted stone.  ERCP deferred at that time as she was asymptomatic.  Seen by GI as an outpatient who ordered follow-up CT A/P to assess for need for endoscopic evaluation however imaging was not completed.  Continue to monitor LFTs.  Right upper quadrant ultrasound concerning for atelectasis cholecystitis and has a positive Murphy sign.  Attempted HIDA scan this morning however patient belligerent and refusing. ?  Try again tomorrow?  Depression with anxiety -Hold Lexapro in setting of seizure-like activity, intracranial bleed, and alcohol use  disorder.  Chronic migraine w/o aura w/o status migrainosus, not intractable -Continue Lamictal.  Failure to thrive in adult, recurrent falls-Secondary to chronic alcohol use with protein calorie malnutrition, deconditioning, and presumed Warnicke's encephalopathy with gait instability. CODE STATUS is DNR/DNI, confirmed with patient's mom on admission.  Consult RD.  PT consult pending    Scheduled Meds:  folic acid  1 mg Oral Daily   lamoTRIgine  100 mg Oral BID   magnesium gluconate  500 mg Oral Daily   multivitamin with minerals  1 tablet Oral Daily   sodium chloride flush  3 mL Intravenous Q12H   thiamine  100 mg Oral Daily   Or   thiamine  100 mg Intravenous Daily   Continuous Infusions:  magnesium sulfate bolus IVPB     PRN Meds:.acetaminophen, albuterol, lidocaine, lip balm, LORazepam **OR** LORazepam, ondansetron **OR** ondansetron (ZOFRAN) IV, senna-docusate  Diet Orders (From admission, onward)     Start     Ordered   02/23/22 1356  Diet NPO time specified Except for: Ice Chips, Other (See Comments)  Diet effective midnight       Comments: Meds whole in puree  Question Answer Comment  Except for Ice Chips   Except for Other (See Comments)      02/23/22 1356            DVT prophylaxis: SCDs Start: 02/22/22 2028   Lab Results  Component Value Date   PLT 206 02/24/2022      Code Status: DNR  Family Communication: no family at bedside this morning, updated mother at bedside last night  Status is: Inpatient  Remains inpatient appropriate because: severity of illness  Level of care: Telemetry Medical  Consultants:  Neurosurgery   Procedures:  none  Microbiology  none  Antimicrobials: none    Objective: Vitals:   02/23/22 1933 02/23/22 2321 02/24/22 0323 02/24/22 0730  BP:  (!) 153/80 (!) 145/75 (!) 156/84  Pulse: (!) 116 (!) 102 (!) 102 (!) 107  Resp: 20 16 20 20   Temp:  98.8 F (37.1 C) 98.9 F (37.2 C) 97.7 F (36.5 C)  TempSrc:   Oral Oral   SpO2: 91% 97% 93% 94%  Weight:      Height:        Intake/Output Summary (Last 24 hours) at 02/24/2022 1150 Last data filed at 02/24/2022 0344 Gross per 24 hour  Intake 1540.18 ml  Output --  Net 1540.18 ml    Wt Readings from Last 3 Encounters:  02/22/22 38.6 kg  02/17/22 39.2 kg  01/08/22 38.3 kg    Examination:  Constitutional: Tremulous Eyes: lids and conjunctivae normal ENMT: mmm Neck: normal, supple Respiratory: clear to auscultation bilaterally, no wheezing, no crackles. Normal respiratory effort.  Cardiovascular: Regular rate and rhythm, no murmurs / rubs / gallops.  No edema Abdomen: soft, no distention, no tenderness. Bowel sounds positive.  Skin: no rashes Neurologic: no focal deficits, equal strength  Data Reviewed: I have independently reviewed following labs and imaging studies   CBC Recent Labs  Lab 02/22/22 1800 02/22/22 2238 02/24/22 0329  WBC 10.5 9.9 7.4  HGB 9.8* 9.3* 9.8*  HCT 29.5* 27.5* 28.9*  PLT 261 256 206  MCV 101.0* 101.1* 100.3*  MCH 33.6 34.2* 34.0  MCHC 33.2 33.8 33.9  RDW 13.6 13.6 13.8  LYMPHSABS 0.6*  --   --   MONOABS  1.2*  --   --   EOSABS 0.3  --   --   BASOSABS 0.1  --   --      Recent Labs  Lab 02/22/22 1750 02/22/22 1800 02/22/22 2238 02/24/22 0329  NA  --  129* 129* 133*  K  --  3.0* 3.1* 5.0  CL  --  88* 93* 100  CO2  --  22 23 21*  GLUCOSE  --  95 114* 84  BUN  --  9 7 <5*  CREATININE  --  0.40* 0.70 0.56  CALCIUM  --  7.6* 7.3* 7.5*  AST  --  167* 157* 230*  ALT  --  70* 66* 90*  ALKPHOS  --  358* 327* 446*  BILITOT  --  1.8* 1.7* 1.7*  ALBUMIN  --  2.8* 2.3* 2.2*  MG  --  1.3* 2.0 1.3*  INR 1.2  --  1.3*  --   AMMONIA  --  41*  --   --      ------------------------------------------------------------------------------------------------------------------ No results for input(s): CHOL, HDL, LDLCALC, TRIG, CHOLHDL, LDLDIRECT in the last 72 hours.  Lab Results  Component Value Date    HGBA1C 4.7 02/17/2022   ------------------------------------------------------------------------------------------------------------------ No results for input(s): TSH, T4TOTAL, T3FREE, THYROIDAB in the last 72 hours.  Invalid input(s): FREET3  Cardiac Enzymes No results for input(s): CKMB, TROPONINI, MYOGLOBIN in the last 168 hours.  Invalid input(s): CK ------------------------------------------------------------------------------------------------------------------    Component Value Date/Time   BNP 43.7 02/24/2021 1707    CBG: Recent Labs  Lab 02/22/22 2155  GLUCAP 116*    Recent Results (from the past 240 hour(s))  Resp Panel by RT-PCR (Flu A&B, Covid) Nasopharyngeal Swab     Status: None   Collection Time: 02/22/22  6:02 PM   Specimen: Nasopharyngeal Swab; Nasopharyngeal(NP) swabs in vial transport medium  Result Value Ref Range Status   SARS Coronavirus 2 by RT PCR NEGATIVE NEGATIVE Final    Comment: (NOTE) SARS-CoV-2 target nucleic acids are NOT DETECTED.  The SARS-CoV-2 RNA is generally detectable in upper respiratory specimens during the acute phase of infection. The lowest concentration of SARS-CoV-2 viral copies this assay can detect is 138 copies/mL. A negative result does not preclude SARS-Cov-2 infection and should not be used as the sole basis for treatment or other patient management decisions. A negative result may occur with  improper specimen collection/handling, submission of specimen other than nasopharyngeal swab, presence of viral mutation(s) within the areas targeted by this assay, and inadequate number of viral copies(<138 copies/mL). A negative result must be combined with clinical observations, patient history, and epidemiological information. The expected result is Negative.  Fact Sheet for Patients:  BloggerCourse.com  Fact Sheet for Healthcare Providers:  SeriousBroker.it  This test is  no t yet approved or cleared by the [place] FDA and  has been authorized for detection and/or diagnosis of SARS-CoV-2 by FDA under an Emergency Use Authorization (EUA). This EUA will remain  in effect (meaning this test can be used) for the duration of the COVID-19 declaration under Section 564(b)(1) of the Act, 21 U.S.C.section 360bbb-3(b)(1), unless the authorization is terminated  or revoked sooner.       Influenza A by PCR NEGATIVE NEGATIVE Final   Influenza B by PCR NEGATIVE NEGATIVE Final    Comment: (NOTE) The Xpert Xpress SARS-CoV-2/FLU/RSV plus assay is intended as an aid in the diagnosis of influenza from Nasopharyngeal swab specimens and should not be used as a sole basis  for treatment. Nasal washings and aspirates are unacceptable for Xpert Xpress SARS-CoV-2/FLU/RSV testing.  Fact Sheet for Patients: BloggerCourse.com  Fact Sheet for Healthcare Providers: SeriousBroker.it  This test is not yet approved or cleared by the [place] FDA and has been authorized for detection and/or diagnosis of SARS-CoV-2 by FDA under an Emergency Use Authorization (EUA). This EUA will remain in effect (meaning this test can be used) for the duration of the COVID-19 declaration under Section 564(b)(1) of the Act, 21 U.S.C. section 360bbb-3(b)(1), unless the authorization is terminated or revoked.  Performed at [facility], [address] [place], [place] [address]   MRSA Next Gen by PCR, Nasal     Status: None   Collection Time: 02/22/22 10:40 PM   Specimen: Nasal Mucosa; Nasal Swab  Result Value Ref Range Status   MRSA by PCR Next Gen NOT DETECTED NOT DETECTED Final    Comment: (NOTE) The GeneXpert MRSA Assay (FDA approved for NASAL specimens only), is one component of a comprehensive MRSA colonization surveillance program. It is not intended to diagnose MRSA infection nor to guide or monitor  treatment for MRSA infections. Test performance is not FDA approved in patients less than 69 years old. Performed at [facility], [address] [place], [place] [address]      Radiology Studies: No results found.   Pamella Pert, MD, PhD [facility]  Between 7 am - 7 pm I am available, please contact me via Amion (for emergencies) or Securechat (non urgent messages)  Between 7 pm - 7 am I am not available, please contact night coverage MD/APP via Amion

## 2022-02-25 DIAGNOSIS — R569 Unspecified convulsions: Secondary | ICD-10-CM | POA: Diagnosis not present

## 2022-02-25 DIAGNOSIS — Z515 Encounter for palliative care: Secondary | ICD-10-CM

## 2022-02-25 DIAGNOSIS — Z7189 Other specified counseling: Secondary | ICD-10-CM

## 2022-02-25 DIAGNOSIS — I6203 Nontraumatic chronic subdural hemorrhage: Secondary | ICD-10-CM | POA: Diagnosis not present

## 2022-02-25 DIAGNOSIS — E43 Unspecified severe protein-calorie malnutrition: Secondary | ICD-10-CM

## 2022-02-25 DIAGNOSIS — I6201 Nontraumatic acute subdural hemorrhage: Secondary | ICD-10-CM | POA: Diagnosis not present

## 2022-02-25 DIAGNOSIS — F1029 Alcohol dependence with unspecified alcohol-induced disorder: Secondary | ICD-10-CM | POA: Diagnosis not present

## 2022-02-25 DIAGNOSIS — R7989 Other specified abnormal findings of blood chemistry: Secondary | ICD-10-CM | POA: Diagnosis not present

## 2022-02-25 DIAGNOSIS — Z66 Do not resuscitate: Secondary | ICD-10-CM

## 2022-02-25 LAB — COMPREHENSIVE METABOLIC PANEL
ALT: 107 U/L — ABNORMAL HIGH (ref 0–44)
AST: 200 U/L — ABNORMAL HIGH (ref 15–41)
Albumin: 2 g/dL — ABNORMAL LOW (ref 3.5–5.0)
Alkaline Phosphatase: 502 U/L — ABNORMAL HIGH (ref 38–126)
Anion gap: 8 (ref 5–15)
BUN: 5 mg/dL — ABNORMAL LOW (ref 6–20)
CO2: 24 mmol/L (ref 22–32)
Calcium: 7.5 mg/dL — ABNORMAL LOW (ref 8.9–10.3)
Chloride: 94 mmol/L — ABNORMAL LOW (ref 98–111)
Creatinine, Ser: 0.42 mg/dL — ABNORMAL LOW (ref 0.44–1.00)
GFR, Estimated: >60 mL/min (ref >60)
Glucose, Bld: 93 mg/dL (ref 70–99)
Potassium: 3.7 mmol/L (ref 3.5–5.1)
Sodium: 126 mmol/L — ABNORMAL LOW (ref 135–145)
Total Bilirubin: 1.9 mg/dL — ABNORMAL HIGH (ref 0.3–1.2)
Total Protein: 4 g/dL — ABNORMAL LOW (ref 6.5–8.1)

## 2022-02-25 LAB — CBC
HCT: 29.4 % — ABNORMAL LOW (ref 36.0–46.0)
Hemoglobin: 10.3 g/dL — ABNORMAL LOW (ref 12.0–15.0)
MCH: 34 pg (ref 26.0–34.0)
MCHC: 35 g/dL (ref 30.0–36.0)
MCV: 97 fL (ref 80.0–100.0)
Platelets: 214 10*3/uL (ref 150–400)
RBC: 3.03 MIL/uL — ABNORMAL LOW (ref 3.87–5.11)
RDW: 13.9 % (ref 11.5–15.5)
WBC: 9.9 10*3/uL (ref 4.0–10.5)
nRBC: 0 % (ref 0.0–0.2)

## 2022-02-25 LAB — MAGNESIUM: Magnesium: 1.5 mg/dL — ABNORMAL LOW (ref 1.7–2.4)

## 2022-02-25 LAB — PHOSPHORUS: Phosphorus: 3.2 mg/dL (ref 2.5–4.6)

## 2022-02-25 MED ORDER — FENTANYL CITRATE PF 50 MCG/ML IJ SOSY: 25.0000 ug | PREFILLED_SYRINGE | INTRAMUSCULAR | Status: DC | PRN

## 2022-02-25 MED ORDER — MORPHINE SULFATE (PF) 2 MG/ML IV SOLN
1.0000 mg | Freq: Four times a day (QID) | INTRAVENOUS | Status: DC
  Administered 2022-02-25 – 2022-02-26 (×3): 2 mg via INTRAVENOUS
  Filled 2022-02-25 (×3): qty 1

## 2022-02-25 MED ORDER — LORAZEPAM 2 MG/ML IJ SOLN: 1.0000 mg | INTRAMUSCULAR | Status: DC | PRN

## 2022-02-25 MED ORDER — FLUCONAZOLE IN SODIUM CHLORIDE 200-0.9 MG/100ML-% IV SOLN
200.0000 mg | INTRAVENOUS | Status: AC
  Administered 2022-02-25: 200 mg via INTRAVENOUS
  Filled 2022-02-25: qty 100

## 2022-02-25 MED ORDER — MORPHINE SULFATE (CONCENTRATE) 10 MG/0.5ML PO SOLN: 5.0000 mg | Freq: Four times a day (QID) | ORAL | Status: DC

## 2022-02-25 MED ORDER — LORAZEPAM 2 MG/ML IJ SOLN
1.0000 mg | Freq: Four times a day (QID) | INTRAMUSCULAR | Status: DC
  Administered 2022-02-25 – 2022-02-27 (×8): 1 mg via INTRAVENOUS
  Filled 2022-02-25 (×8): qty 1

## 2022-02-25 MED ORDER — GLYCOPYRROLATE 0.2 MG/ML IJ SOLN: 0.2000 mg | INTRAMUSCULAR | Status: DC | PRN

## 2022-02-25 MED ORDER — FENTANYL CITRATE PF 50 MCG/ML IJ SOSY
25.0000 ug | PREFILLED_SYRINGE | INTRAMUSCULAR | Status: DC | PRN
  Administered 2022-02-25 (×2): 25 ug via INTRAVENOUS
  Filled 2022-02-25 (×2): qty 1

## 2022-02-25 MED ORDER — BIOTENE DRY MOUTH MT LIQD: 15.0000 mL | OROMUCOSAL | Status: DC | PRN

## 2022-02-25 MED ORDER — NICOTINE 14 MG/24HR TD PT24
14.0000 mg | MEDICATED_PATCH | Freq: Every day | TRANSDERMAL | Status: DC
  Administered 2022-02-25: 14 mg via TRANSDERMAL
  Filled 2022-02-25: qty 1

## 2022-02-25 MED ORDER — GLYCOPYRROLATE 1 MG PO TABS: 1.0000 mg | ORAL_TABLET | ORAL | Status: DC | PRN

## 2022-02-25 MED ORDER — POLYVINYL ALCOHOL 1.4 % OP SOLN
1.0000 [drp] | Freq: Four times a day (QID) | OPHTHALMIC | Status: DC | PRN
  Filled 2022-02-25: qty 15

## 2022-02-25 MED ORDER — CLONAZEPAM 0.5 MG PO TABS
0.5000 mg | ORAL_TABLET | Freq: Three times a day (TID) | ORAL | Status: DC | PRN
  Filled 2022-02-25: qty 1

## 2022-02-25 MED ORDER — MAGNESIUM SULFATE 2 GM/50ML IV SOLN
2.0000 g | Freq: Once | INTRAVENOUS | Status: AC
  Administered 2022-02-25: 2 g via INTRAVENOUS
  Filled 2022-02-25: qty 50

## 2022-02-25 NOTE — Progress Notes (Signed)
PT Cancellation Note ? ?Patient Details ?Name: Kelly Zuniga ?MRN: [id number] ?DOB: 1966/03/15 ? ? ?Cancelled Treatment:    Reason Eval/Treat Not Completed: Other (comment). Per discussion with palliative care pt is transitioning to comfort care, PT services no longer indicated. PT will sign off at this time. ? ? ?Arlyss Gandy ?02/25/2022, 3:28 PM ?

## 2022-02-25 NOTE — Progress Notes (Signed)
?Progress Note ? ? ?Patient: Kelly Zuniga HCW:[id number] DOB: [birth year] DOA: 02/22/2022     3 ?DOS: the patient was seen and examined on 02/25/2022 ?  ?Brief hospital course: ?Kelly Zuniga is a 56 y.o. female with medical history significant for chronic alcohol use with suspected Warnicke's encephalopathy associated with gait abnormality, chronic migraines and seizure-like activity on lamotrigine, macrocytic anemia, depression/anxiety, and chronic deconditioning with failure to thrive who is admitted with acute on chronic subdural hematoma in setting of seizure-like activity with presumed head injury.  Poor overall prognosis and mom would like to focus on comfort. ? ?Assessment and Plan: ?* Acute on chronic intracranial subdural hematoma (HCC) ?CT head shows increased size of right cerebral hemisphere subdural hematoma.  New small volume of acute hemorrhage changes seen within this collection and overlying the anterior lateral aspect of the right temporal lobe.  May be related to reported trauma to head after fall at home.  She is not on any blood thinners. ?-Neurosurgery, Dr. Ellene Route: The subdural is not likely impacting the patient's neurologic function at this time.  This should be observed.  Repeat CT scan in a month's time would be appropriate and I can follow this patient up either as an outpatient or if still institutionalized. ?-Avoid all blood thinners ?-Continue neurochecks ?-Continue Lamictal for seizure prophylaxis ? ?Alcohol dependence ([place]) ?Patient with chronic daily alcohol use, likely 4-5 drinks bourbon daily per family.  Last drink reportedly night of 3/4.  Has history of withdrawal.  Has suspected Warnicke's encephalopathy per outpatient neurology evaluation. ?-Continue CIWA protocol with Ativan as needed ?-Continue thiamine, folate, MVM ? ?Hyponatremia ?Mild with sodium 129 on admission.  Likely related to chronic alcohol use and low solute diet. ?-Na lower this AM at 126-- will order urine  studies after seen by palliative if goal is not comfort ? ?Hypokalemia ?-replete as able ? ?Hypomagnesemia ?-replete as needed ? ?Seizure-like activity ([place]) ?Continue Lamictal, CIWA with Ativan as needed, seizure precautions. ? ?Macrocytic anemia ?- Likely secondary to chronic alcohol use.  Labs 02/17/2022 showed folate 14.1 and vitamin B12 843.  Continue to monitor. ? ?Elevated LFTs ?Chronically elevated LFTs in the setting of chronic alcohol use.  Alk phos higher than recent baseline.  She had MRCP in April 2022 which showed dilated CBD with possible impacted stone.  ERCP deferred at that time as she was asymptomatic.  Seen by GI as an outpatient who ordered follow-up CT A/P to assess for need for endoscopic evaluation however imaging was not completed. ?-Continue to monitor LFTs at this time as she is not having any sign of symptomatic CBD stone ?-holding on HIDA until after [place] ? ?Depression with anxiety ?Hold Lexapro in setting of seizure-like activity, intracranial bleed, and alcohol use disorder. ? ?Chronic migraine w/o aura w/o status migrainosus, not intractable ?Continue Lamictal. ? ?Failure to thrive in adult ?Secondary to chronic alcohol use with protein calorie malnutrition, deconditioning, and presumed Warnicke's encephalopathy with gait instability. ?-CODE STATUS is DNR/DNI ?-poor overall prognosis--- will get palliative care consult as Mom would prefer to focus on comfort ? ?Protein-calorie malnutrition, severe ?Nutrition Status: ?Nutrition Problem: Severe Malnutrition ?Etiology: chronic illness (ETOH abuse, FTT, Wernicke's encephalopathy) ?Signs/Symptoms: energy intake < or equal to 75% for > or equal to 1 month, moderate fat depletion, severe muscle depletion ?Interventions: Ensure Enlive (each supplement provides 350kcal and 20 grams of protein), MVI ? ? ? ? ? ? ?Subjective: c/o jaw pain ? ?Physical Exam: ?Vitals:  ? 02/25/22 1761 02/25/22 0320 02/25/22 6073  02/25/22 1132  ?BP:  (!) 148/92 (!)  142/86 118/75  ?Pulse:  (!) 104 (!) 106 97  ?Resp:  _0 ?Temp:  97.7 ?F (36.5 ?C) 97.8 ?F (36.6 ?C) 98.4 ?F (36.9 ?C)  ?TempSrc:  Oral Oral Oral  ?SpO2: 95% 98% 98% 96%  ?Weight:      ?Height:      ? ? ?General: Appearance:    Thin female in no acute distress  ?   ?Lungs:     respirations unlabored  ?Heart:    Normal heart rate.  ?  ?MS:   All extremities are intact.  ?  ?Neurologic:   Awake, alert, oriented x 3. No apparent focal neurological           defect.   ?  ?Data Reviewed: ?Na 126. AST/ALT elevated ? ? ? ?Disposition: ?Status is: Inpatient ?Remains inpatient appropriate because: needs GOC ? Planned Discharge Destination:  tbd ? ?Time spent: 75 minutes ? ?Author: ?Geradine Girt, DO ?02/25/2022 12:13 PM ? ?For on call review [email or web address].  ?

## 2022-02-25 NOTE — Progress Notes (Signed)
PRN Alb treatment given. Pt seems in pain but not in distress. RT will continue to monitor as needed. ?

## 2022-02-25 NOTE — Consult Note (Cosign Needed)
Consultation Note Date: 02/25/2022   Patient Name: Kelly Zuniga  DOB: 1966/08/22  MRN: [id number]  Age / Sex: 56 y.o., female  PCP: Kelly Coss, NP Referring Physician: Geradine Girt, DO  Reason for Consultation: Establishing goals of care  HPI/Patient Profile: 56 y.o. female  with past medical history of chronic alcohol use with suspected Wernicke's encephalopathy associated with gait abnormality, chronic migraines  and seizure-like activity on lamotrigine, macrocytic anemia, depression/anxiety, and chronic deconditioning with failure to thrive. She was admitted on 02/22/2022 with acute on chronic subdural hematoma in setting of seizure-like activity with presumed head injury.   Clinical Assessment and Goals of Care: I have reviewed medical records including EPIC notes, labs and imaging, and received an update from the bedside RN. Patient is eating/drinking very little (bites and sips with prompting).   I met with patient's mother Kelly Zuniga in the 4[facility] conference room to discuss diagnosis, prognosis, GOC, EOL wishes, disposition, and options. I introduced Palliative Medicine as specialized medical care for people living with serious illness. It focuses on providing relief from the symptoms and stress of a serious illness.   We discussed a brief life review of the patient. Kelly Zuniga becomes very tearful as she shares that Kelly Zuniga has struggled with alcoholism most of her adult life. She was in psychiatric counseling her freshman year of college. 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 Prior to admission, Kelly Zuniga lived with her significant other Kelly Zuniga. They have been together for 7+ years but are not legally married. Kelly Zuniga describes their relationship as "co-dependent". Although she does not have biological children of her own, Kelly Zuniga has been like a mother to  Kelly Zuniga's children (ages 39 and 78).   As far as functional status, Kelly Zuniga reports that Kelly Zuniga has suffered a slow and gradual decline over the years. Prior to admission, she had deteriorated to the point she was no longer able to do "anything for herself".   We discussed her current illness and what it means in the larger context of her ongoing co-morbidities.  Natural disease trajectory of chronic illness and debility was discussed. Reviewed that years of alcoholism has done significant and likely irreversible damage to Kelly Zuniga's brain. Kelly Zuniga verbalizes understanding that her daughter's prognosis is poor, stating she feels her "time is limited". Education and counseling provided on natural trajectory at EOL  I attempted to elicit values and goals of care important to the patient. Kelly Zuniga does not think Kelly Zuniga understands she is approaching EOL, but she has verbalized her wish to go home multiple times. Unfortunately, Kelly Zuniga is unable to care for Kelly Zuniga at home.    The difference between full scope medical intervention and comfort care was considered.  I introduced the concept of a comfort path, emphasizing that this path involves de-escalating and stopping full scope medical interventions, allowing a natural course to occur. Discussed that the goal is comfort and dignity rather than cure/prolonging life.  Introduced hospice philosophy and provided information on home versus residential hospice services.  Kelly Zuniga states she wants her daughter to be comfortable for the time she has left. Discussed transitioning to comfort care in the hospital and what that entails--keeping her clean and dry, no labs, no artificial hydration or feeding, no antibiotics, minimizing of medications, comfort feeds, and medication for symptoms such as pain, dyspnea, or anxiety.   Kelly Zuniga and I went to bedside to talk with Kelly Zuniga. She is lethargic but oriented. On my assessment, Kelly Zuniga does not seem to have limited insight into  her current condition and I do not think she has capacity to make any complex decisions for herself at this point. She perseverates on the idea of going home, but we explained this is unfortunately not an option. She ultimately is agreeable to focus on comfort.    Primary decision maker: There is no documented HCPOA. Next of kin is patient's mother Kelly Zuniga    SUMMARY OF RECOMMENDATIONS   Full comfort measures initiated Referral for Kelly Zuniga per mother's request - TOC consult placed; TOC and hospice liaison aware Discontinued orders not focused on comfort (labs, PT/OT, minimized medications) Added orders for symptom management at EOL Fluconazole 200 mg IV x 1 dose (for severe thrush) PMT will continue to follow   Symptom Management:  Scheduled morphine every 6 hours (5-10 mg concentrate solution OR 1-2 mg IV) Scheduled Lorazepam 1 mg IV every 6 hours Lorazepam 1-2 mg every 4 hours as needed for anxiety Glycopyrrolate (ROBINUL) for excessive secretions Ondansetron (ZOFRAN) prn for nausea Polyvinyl alcohol (LIQUIFILM TEARS) prn for dry eyes Antiseptic oral rinse (BIOTENE) prn for dry mouth  Code Status/Advance Care Planning: DNR/DNI (as previously documented)  Prognosis:  < 2 weeks  Discharge Planning: To Be Determined      Primary Diagnoses: Present on Admission:  Acute on chronic intracranial subdural hematoma (HCC)  Alcohol dependence (HCC)  Hypomagnesemia  Elevated LFTs  Chronic migraine w/o aura w/o status migrainosus, not intractable  Macrocytic anemia   I have reviewed the medical record, interviewed the patient and family, and examined the patient. The following aspects are pertinent.  Past Medical History:  Diagnosis Date   Alcohol abuse    Allergy    Anemia    low iron   Anxiety    Asthma    Asthma    [employer] 11/09/2020   Depression    posibly bipolar disorder. 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   Hypertension    never on meds    Pneumonia    at 13 years   Seizure ([place]) 02/09/2021   Seizure ([place])    Substance abuse ([place])    [employer]reesia 11/09/2020     Family History  Problem Relation Age of Onset   Coronary artery disease Other        1st degree relative female <50   Depression Other        Family Hx   Diabetes Other        Family Hx 1st degree relative   Ovarian cancer Other        Family Hx    Healthy Mother    Diabetes Father    Gout Father    Hypertension Father    Heart failure Father    Cancer Father    Heart attack Father    Scheduled Meds:  feeding supplement  237 mL  Oral BID BM   folic acid  1 mg Oral Daily   lamoTRIgine  100 mg Oral BID   magnesium gluconate  500 mg Oral Daily   multivitamin with minerals  1 tablet Oral Daily   nicotine  14 mg Transdermal Daily   sodium chloride flush  3 mL Intravenous Q12H   thiamine  100 mg Oral Daily   Or   thiamine  100 mg Intravenous Daily   Continuous Infusions: PRN Meds:.acetaminophen, albuterol, [START ON 02/26/2022] clonazePAM, fentaNYL (SUBLIMAZE) injection, lidocaine, lip balm, LORazepam **OR** LORazepam, ondansetron **OR** ondansetron (ZOFRAN) IV, senna-docusate   Allergies  Allergen Reactions   Acetaminophen Other (See Comments)    Makes LFT get elevated and causes liver problems/issues    Lithium Other (See Comments)    Causes the patient to have no appetite   Promethazine-Codeine Nausea And Vomiting and Other (See Comments)    Any codeine-based cough syrups cause N/V   Codeine Nausea And Vomiting   Morphine And Related Nausea And Vomiting   Review of Systems  HENT:  Positive for trouble swallowing.        Mouth pain   Physical Exam Vitals reviewed.  Constitutional:      General: She is not in acute distress.    Appearance: She is ill-appearing.  Pulmonary:     Effort: No respiratory  distress.  Neurological:     Mental Status: She is lethargic.     Motor: Weakness present.     Comments: Mostly oriented  Psychiatric:        Mood and Affect: Mood is anxious. Affect is flat.    Vital Signs: BP 118/75 (BP Location: Right Arm)    Pulse 97    Temp 98.4 F (36.9 C) (Oral)    Resp 16    Ht 5' (1.524 m)    Wt 38.6 kg    LMP 11/04/2013    SpO2 96%    BMI 16.60 kg/m  Pain Scale: 0-10   Pain Score: 10-Worst pain ever   SpO2: SpO2: 96 % O2 Device:SpO2: 96 % O2 Flow Rate: .O2 Flow Rate (L/min): 2 L/min    LBM: Last BM Date : 02/24/22 Baseline Weight: Weight: 38.6 kg Most recent weight: Weight: 38.6 kg       Palliative Assessment/Data: PPS 20%    MDM - High    Signed by: Lavena Bullion, NP   Please contact Palliative Medicine Team phone at [phone] for questions and concerns.  For individual provider: See Shea Evans

## 2022-02-25 NOTE — Progress Notes (Signed)
[facility])Hospital Liaison Note ? ?Referral received from [facility] for patient/family interest in [facility]. Chart under review by [facility] physican.  ? ?Hospice eligibility pending.  ? ?[facility] liaison will evaluate tomorrow morning.  ? ?Please call with any questions or concerns. Thank you ? ?Dionicio Stall, LCSW ?[facility] Liasion ?[phone] ?

## 2022-02-25 NOTE — Progress Notes (Signed)
SLP Cancellation Note ? ?Patient Details ?Name: Kelly Zuniga ?MRN: [id number] ?DOB: 1966/07/04 ? ? ?Cancelled treatment:       Reason Eval/Treat Not Completed: Other (comment) (Per chart review, pt has been transitioned to comfort care and SLP services have been discontinued by Palliative Medicine's NP.) ? ?Petrona Wyeth I. Vear Clock, MS, CCC-SLP ?[facility] ?Office number [phone] ?Pager [phone] ? ?Scheryl Marten ?02/25/2022, 3:48 PM ?

## 2022-02-25 NOTE — Assessment & Plan Note (Addendum)
Nutrition Status: ?Nutrition Problem: Severe Malnutrition ?Etiology: chronic illness (ETOH abuse, FTT, Wernicke's encephalopathy) ?Signs/Symptoms: energy intake < or equal to 75% for > or equal to 1 month, moderate fat depletion, severe muscle depletion ?Interventions: Ensure Enlive (each supplement provides 350kcal and 20 grams of protein), MVI ? ?-comfort care ?

## 2022-02-25 NOTE — Progress Notes (Signed)
Pt A&O x4, respirations and pulse WNL. Pt resting comfortably in bed, call bell in reach.  ?

## 2022-02-26 DIAGNOSIS — Z789 Other specified health status: Secondary | ICD-10-CM

## 2022-02-26 DIAGNOSIS — R0682 Tachypnea, not elsewhere classified: Secondary | ICD-10-CM

## 2022-02-26 DIAGNOSIS — I6200 Nontraumatic subdural hemorrhage, unspecified: Secondary | ICD-10-CM

## 2022-02-26 DIAGNOSIS — I6203 Nontraumatic chronic subdural hemorrhage: Secondary | ICD-10-CM | POA: Diagnosis not present

## 2022-02-26 DIAGNOSIS — R638 Other symptoms and signs concerning food and fluid intake: Secondary | ICD-10-CM

## 2022-02-26 DIAGNOSIS — I6201 Nontraumatic acute subdural hemorrhage: Secondary | ICD-10-CM | POA: Diagnosis not present

## 2022-02-26 DIAGNOSIS — Z515 Encounter for palliative care: Secondary | ICD-10-CM

## 2022-02-26 DIAGNOSIS — Z66 Do not resuscitate: Secondary | ICD-10-CM

## 2022-02-26 MED ORDER — MORPHINE SULFATE (PF) 2 MG/ML IV SOLN: 2.0000 mg | Freq: Four times a day (QID) | INTRAVENOUS | Status: DC

## 2022-02-26 MED ORDER — MORPHINE SULFATE (PF) 2 MG/ML IV SOLN
2.0000 mg | INTRAVENOUS | Status: DC | PRN
  Administered 2022-02-26: 14:00:00 2 mg via INTRAVENOUS

## 2022-02-26 MED ORDER — LORAZEPAM 2 MG/ML IJ SOLN: 1.0000 mg | INTRAMUSCULAR | Status: DC | PRN

## 2022-02-26 MED ORDER — MORPHINE SULFATE (PF) 2 MG/ML IV SOLN
2.0000 mg | INTRAVENOUS | Status: DC
  Administered 2022-02-26 – 2022-02-27 (×5): 2 mg via INTRAVENOUS
  Filled 2022-02-26 (×6): qty 1

## 2022-02-26 NOTE — Progress Notes (Signed)
[employer] [facility]) Hospital liaison note ? ?Referral received from [facility] for patient/family interest in [facility]. Chart under review by [place]. Hospice eligibility approved. ? ?Unfortunately we do not have a bed available today. [facility] will notify healthcare team once a bed becomes available.  ? ?Thank you, ?Clementeen Hoof, BSN, RN ?[phone] ?

## 2022-02-26 NOTE — Progress Notes (Signed)
PROGRESS NOTE    Kelly Zuniga  WGN:[id number] DOB: 1966-03-23 DOA: 02/22/2022 PCP: [name], NP    Brief Narrative:  Kelly Zuniga is a 56 y.o. female with medical history significant for chronic alcohol use with suspected Warnicke's encephalopathy associated with gait abnormality, chronic migraines and seizure-like activity on lamotrigine, macrocytic anemia, depression/anxiety, and chronic deconditioning with failure to thrive who is admitted with acute on chronic subdural hematoma in setting of seizure-like activity with presumed head injury.  Poor overall prognosis and mom would like to focus on comfort.  Transitioned to comfort care on 3/8.  Await [facility].    Assessment and Plan: * Acute on chronic intracranial subdural hematoma (HCC) CT head shows increased size of right cerebral hemisphere subdural hematoma.  New small volume of acute hemorrhage changes seen within this collection and overlying the anterior lateral aspect of the right temporal lobe.  May be related to reported trauma to head after fall at home.  She is not on any blood thinners. -transition to comfort care  Alcohol dependence (HCC) Patient with chronic daily alcohol use, likely 4-5 drinks bourbon daily per family.  Last drink reportedly night of 3/4.  Has history of withdrawal.  Has suspected Warnicke's encephalopathy per outpatient neurology evaluation. -transition to comfort care  Hyponatremia -transition to comfort care  Hypokalemia -comfort care  Hypomagnesemia -comfort care  Seizure-like activity (HCC) Continue Lamictal as able  Macrocytic anemia -comfort care  Elevated LFTs -comfort care  Depression with anxiety -comfort care  Chronic migraine w/o aura w/o status migrainosus, not intractable Continue Lamictal.  Failure to thrive in adult Secondary to chronic alcohol use with protein calorie malnutrition, deconditioning, and presumed Warnicke's encephalopathy with gait  instability. -CODE STATUS is DNR/DNI -poor overall prognosis--- will get palliative care consult as Mom would prefer to focus on comfort  Protein-calorie malnutrition, severe Nutrition Status: Nutrition Problem: Severe Malnutrition Etiology: chronic illness (ETOH abuse, FTT, Wernicke's encephalopathy) Signs/Symptoms: energy intake < or equal to 75% for > or equal to 1 month, moderate fat depletion, severe muscle depletion Interventions: Ensure Enlive (each supplement provides 350kcal and 20 grams of protein), MVI  -comfort care          DVT prophylaxis:     Code Status: DNR   Disposition Plan:  Level of care: Telemetry Medical Status is: Inpatient Remains inpatient appropriate because: needs residential hospice    Consultants:  Palliative care NS   Subjective: In bed, comfortable  Objective: Vitals:   02/25/22 1132 02/25/22 1943 02/25/22 2300 02/26/22 0745  BP: 118/75 140/83 (!) 143/82 (!) 145/64  Pulse: 97 88 (!) 103 (!) 130  Resp: (!) 29  Temp: 98.4 F (36.9 C) 98 F (36.7 C) 98.2 F (36.8 C) 100 F (37.8 C)  TempSrc: Oral Oral Oral Oral  SpO2: 96% 96% 94% (!) 85%  Weight:      Height:       No intake or output data in the 24 hours ending 02/26/22 1249 Filed Weights   02/22/22 1723  Weight: 38.6 kg    Examination:  Appears comfortable in the bed      Data Reviewed: I have personally reviewed following labs and imaging studies  CBC: Recent Labs  Lab 02/22/22 1800 02/22/22 2238 02/24/22 0329 02/25/22 0651  WBC 10.5 9.9 7.4 9.9  NEUTROABS 7.9*  --   --   --   HGB 9.8* 9.3* 9.8* 10.3*  HCT 29.5* 27.5* 28.9* 29.4*  MCV 101.0* 101.1* 100.3* 97.0  PLT 261 256 206 214   Basic Metabolic Panel: Recent Labs  Lab 02/22/22 1800 02/22/22 2238 02/24/22 0329 02/25/22 0651  NA 129* 129* 133* 126*  K 3.0* 3.1* 5.0 3.7  CL 88* 93* 100 94*  CO2 22 23 21* 24  GLUCOSE 95 114* 84 93  BUN 9 7 <5* 5*  CREATININE 0.40* 0.70 0.56 0.42*   CALCIUM 7.6* 7.3* 7.5* 7.5*  MG 1.3* 2.0 1.3* 1.5*  PHOS  --   --  1.8* 3.2   GFR: Estimated Creatinine Clearance: 49 mL/min (A) (by C-G formula based on SCr of 0.42 mg/dL (L)). Liver Function Tests: Recent Labs  Lab 02/22/22 1800 02/22/22 2238 02/24/22 0329 02/25/22 0651  AST 167* 157* 230* 200*  ALT 70* 66* 90* 107*  ALKPHOS 358* 327* 446* 502*  BILITOT 1.8* 1.7* 1.7* 1.9*  PROT 5.1* 4.5* 4.2* 4.0*  ALBUMIN 2.8* 2.3* 2.2* 2.0*   Recent Labs  Lab 02/22/22 1800  LIPASE 19   Recent Labs  Lab 02/22/22 1800  AMMONIA 41*   Coagulation Profile: Recent Labs  Lab 02/22/22 1750 02/22/22 2238  INR 1.2 1.3*   Cardiac Enzymes: No results for input(s): CKTOTAL, CKMB, CKMBINDEX, TROPONINI in the last 168 hours. BNP (last 3 results) No results for input(s): PROBNP in the last 8760 hours. HbA1C: No results for input(s): HGBA1C in the last 72 hours. CBG: Recent Labs  Lab 02/22/22 2155  GLUCAP 116*   Lipid Profile: No results for input(s): CHOL, HDL, LDLCALC, TRIG, CHOLHDL, LDLDIRECT in the last 72 hours. Thyroid Function Tests: No results for input(s): TSH, T4TOTAL, FREET4, T3FREE, THYROIDAB in the last 72 hours. Anemia Panel: No results for input(s): VITAMINB12, FOLATE, FERRITIN, TIBC, IRON, RETICCTPCT in the last 72 hours. Sepsis Labs: No results for input(s): PROCALCITON, LATICACIDVEN in the last 168 hours.  Recent Results (from the past 240 hour(s))  Resp Panel by RT-PCR (Flu A&B, Covid) Nasopharyngeal Swab     Status: None   Collection Time: 02/22/22  6:02 PM   Specimen: Nasopharyngeal Swab; Nasopharyngeal(NP) swabs in vial transport medium  Result Value Ref Range Status   SARS Coronavirus 2 by RT PCR NEGATIVE NEGATIVE Final    Comment: (NOTE) SARS-CoV-2 target nucleic acids are NOT DETECTED.  The SARS-CoV-2 RNA is generally detectable in upper respiratory specimens during the acute phase of infection. The lowest concentration of SARS-CoV-2 viral copies  this assay can detect is 138 copies/mL. A negative result does not preclude SARS-Cov-2 infection and should not be used as the sole basis for treatment or other patient management decisions. A negative result may occur with  improper specimen collection/handling, submission of specimen other than nasopharyngeal swab, presence of viral mutation(s) within the areas targeted by this assay, and inadequate number of viral copies(<138 copies/mL). A negative result must be combined with clinical observations, patient history, and epidemiological information. The expected result is Negative.  Fact Sheet for Patients:  BloggerCourse.com  Fact Sheet for Healthcare Providers:  SeriousBroker.it  This test is no t yet approved or cleared by the [place] FDA and  has been authorized for detection and/or diagnosis of SARS-CoV-2 by FDA under an Emergency Use Authorization (EUA). This EUA will remain  in effect (meaning this test can be used) for the duration of the COVID-19 declaration under Section 564(b)(1) of the Act, 21 U.S.C.section 360bbb-3(b)(1), unless the authorization is terminated  or revoked sooner.       Influenza A by PCR NEGATIVE NEGATIVE Final   Influenza B by  PCR NEGATIVE NEGATIVE Final    Comment: (NOTE) The Xpert Xpress SARS-CoV-2/FLU/RSV plus assay is intended as an aid in the diagnosis of influenza from Nasopharyngeal swab specimens and should not be used as a sole basis for treatment. Nasal washings and aspirates are unacceptable for Xpert Xpress SARS-CoV-2/FLU/RSV testing.  Fact Sheet for Patients: BloggerCourse.com  Fact Sheet for Healthcare Providers: SeriousBroker.it  This test is not yet approved or cleared by the [place] FDA and has been authorized for detection and/or diagnosis of SARS-CoV-2 by FDA under an Emergency Use Authorization (EUA). This EUA  will remain in effect (meaning this test can be used) for the duration of the COVID-19 declaration under Section 564(b)(1) of the Act, 21 U.S.C. section 360bbb-3(b)(1), unless the authorization is terminated or revoked.  Performed at [facility], [address], [place], [place] [address]   MRSA Next Gen by PCR, Nasal     Status: None   Collection Time: 02/22/22 10:40 PM   Specimen: Nasal Mucosa; Nasal Swab  Result Value Ref Range Status   MRSA by PCR Next Gen NOT DETECTED NOT DETECTED Final    Comment: (NOTE) The GeneXpert MRSA Assay (FDA approved for NASAL specimens only), is one component of a comprehensive MRSA colonization surveillance program. It is not intended to diagnose MRSA infection nor to guide or monitor treatment for MRSA infections. Test performance is not FDA approved in patients less than 54 years old. Performed at [facility], [address] [place], [place] [address]   Culture, blood (routine x 2)     Status: None (Preliminary result)   Collection Time: 02/23/22  9:14 AM   Specimen: BLOOD  Result Value Ref Range Status   Specimen Description BLOOD RIGHT ANTECUBITAL  Final   Special Requests   Final    BOTTLES DRAWN AEROBIC AND ANAEROBIC Blood Culture adequate volume   Culture   Final    NO GROWTH 3 DAYS Performed at [facility], [address], [place], [place] [address]    Report Status PENDING  Incomplete  Culture, blood (routine x 2)     Status: None (Preliminary result)   Collection Time: 02/23/22  9:23 AM   Specimen: BLOOD  Result Value Ref Range Status   Specimen Description BLOOD RIGHT ANTECUBITAL  Final   Special Requests   Final    BOTTLES DRAWN AEROBIC AND ANAEROBIC Blood Culture adequate volume   Culture   Final    NO GROWTH 3 DAYS Performed at [facility], [address], [place], [place] [address]    Report Status PENDING  Incomplete         Radiology Studies: DG Swallowing Func-Speech  Pathology  Result Date: 02/24/2022 Table formatting from the original result was not included. Objective Swallowing Evaluation: Type of Study: MBS-Modified Barium Swallow Study  Patient Details Name: Kelly Zuniga MRN: [id number] Date of Birth: October 08, 1966 Today's Date: 02/24/2022 Time: SLP Start Time (ACUTE ONLY): 1305 -SLP Stop Time (ACUTE ONLY): 1330 SLP Time Calculation (min) (ACUTE ONLY): 25 min Past Medical History: Past Medical History: Diagnosis Date  Alcohol abuse   Allergy   Anemia   low iron  Anxiety   Asthma   Asthma   Phreesia 11/09/2020  Depression   posibly bipolar disorder. 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 female with medical history significant for chronic alcohol use with suspected Warnicke's encephalopathy associated with gait abnormality, chronic migraines and seizure-like activity on lamotrigine, macrocytic anemia, depression/anxiety, and chronic deconditioning with failure to thrive who is admitted with acute on chronic subdural hematoma in setting of seizure-like activity with presumed head injury.  Subjective: alert, cooperative  Recommendations for follow up therapy are one component of a multi-disciplinary discharge planning process, led by the attending physician.  Recommendations may be updated based on patient status,  additional functional criteria and insurance authorization. Assessment / Plan / Recommendation Clinical Impressions 02/24/2022 Clinical Impression Patient presents with a mild oropharyngeal dysphagia as per this MBS. During oral phase, patient exhibited mildly delayed oral transit of puree consistency and barium tablet with thin liquid barium but full clearance of oral cavity post initial swallow. During pharyngeal phase of swallow, patient exhibited very mild swallow initiation delay at level of vallecular sinus with thin liquids only. After initial swallows, patient exhibited trace-mild vallecular residual with puree solids,  trace vallecular and trace pyriform residuals with thin and nectar thick liquid barium. Patient did exhibit instances of flash penetration during the swallow (PAS 2)  with cup sips of thin liquid barium, but penetrate fully cleared laryngeal vestibule and no aspiration occured with any of the tested barium consistencies.1/2 barium tablet transited pharyngeally without difficulty and esophageal sweep was unremarkable. SLP is recommending to initiate Dys 2 (fine chop) solids and thin liquids. SLP will continue to follow patient for diet toleration. SLP Visit Diagnosis Dysphagia, oropharyngeal phase (R13.12) Attention and concentration deficit following -- Frontal lobe and executive function deficit following -- Impact on safety and function Mild aspiration risk   Treatment Recommendations 02/24/2022 Treatment Recommendations Therapy as outlined in treatment plan below   Prognosis 02/24/2022 Prognosis for Safe Diet Advancement Good Barriers to Reach Goals Cognitive deficits Barriers/Prognosis Comment -- Diet Recommendations 02/24/2022 SLP Diet Recommendations Dysphagia 2 (Fine chop) solids;Thin liquid Liquid Administration via Cup;Straw Medication Administration Other (Comment) Compensations Slow rate;Small sips/bites Postural Changes Seated upright at 90 degrees   Other Recommendations 02/24/2022  Recommended Consults -- Oral Care Recommendations Oral care BID Other Recommendations -- Follow Up Recommendations No SLP follow up Assistance recommended at discharge Set up Supervision/Assistance Functional Status Assessment Patient has had a recent decline in their functional status and demonstrates the ability to make significant improvements in function in a reasonable and predictable amount of time. Frequency and Duration  02/24/2022 Speech Therapy Frequency (ACUTE ONLY) min 1 x/week Treatment Duration 1 week   Oral Phase 02/24/2022 Oral Phase Impaired Oral - Pudding Teaspoon -- Oral - Pudding Cup -- Oral - Honey Teaspoon -- Oral - Honey Cup -- Oral - Nectar Teaspoon -- Oral - Nectar Cup WFL Oral - Nectar Straw -- Oral - Thin Teaspoon -- Oral - Thin Cup WFL Oral - Thin Straw -- Oral - Puree Delayed oral transit Oral - Mech Soft -- Oral - Regular -- Oral - Multi-Consistency -- Oral - Pill Decreased bolus  cohesion;Delayed oral transit Oral Phase - Comment --  Pharyngeal Phase 02/24/2022 Pharyngeal Phase Impaired Pharyngeal- Pudding Teaspoon -- Pharyngeal -- Pharyngeal- Pudding Cup -- Pharyngeal -- Pharyngeal- Honey Teaspoon -- Pharyngeal -- Pharyngeal- Honey Cup -- Pharyngeal -- Pharyngeal- Nectar Teaspoon -- Pharyngeal -- Pharyngeal- Nectar Cup Pharyngeal residue - valleculae;Pharyngeal residue - pyriform Pharyngeal -- Pharyngeal- Nectar Straw -- Pharyngeal -- Pharyngeal- Thin Teaspoon -- Pharyngeal -- Pharyngeal- Thin Cup Delayed swallow initiation-vallecula;Pharyngeal residue - valleculae;Pharyngeal residue - pyriform;Penetration/Aspiration during swallow;Reduced airway/laryngeal closure Pharyngeal Material enters airway, remains ABOVE vocal cords then ejected out Pharyngeal- Thin Straw -- Pharyngeal -- Pharyngeal- Puree Pharyngeal residue - valleculae Pharyngeal -- Pharyngeal- Mechanical Soft -- Pharyngeal -- Pharyngeal- Regular -- Pharyngeal -- Pharyngeal- Multi-consistency -- Pharyngeal -- Pharyngeal- Pill WFL  Pharyngeal -- Pharyngeal Comment --  Cervical Esophageal Phase  02/24/2022 Cervical Esophageal Phase Impaired Pudding Teaspoon -- Pudding Cup -- Honey Teaspoon -- Honey Cup -- Nectar Teaspoon -- Nectar Cup -- Nectar Straw -- Thin Teaspoon -- Thin Cup -- Thin Straw -- Puree -- Mechanical Soft -- Regular -- Multi-consistency -- Pill -- Cervical Esophageal Comment -- Angela Nevin, MA, CCC-SLP Speech Therapy                          Scheduled Meds:  lamoTRIgine  100 mg Oral BID   LORazepam  1 mg Intravenous Q6H   morphine CONCENTRATE  5-10 mg Oral Q6H   Or    morphine injection  1-2 mg Intravenous Q6H   nicotine  14 mg Transdermal Daily   sodium chloride flush  3 mL Intravenous Q12H   Continuous Infusions:   LOS: 4 days    Time spent: 65 minutes spent on chart review, discussion with nursing staff, consultants, updating family and interview/physical exam; more than 50% of that time was spent in counseling and/or coordination of care.    Joseph Art, DO [facility] Available via Epic secure chat 7am-7pm After these hours, please refer to coverage provider listed on [email or web address] 02/26/2022, 12:49 PM

## 2022-02-26 NOTE — Progress Notes (Addendum)
Patient WL:Kelly Zuniga      DOB: 01/27/1966      JHE:[id number] ? ? ? ?  ?Palliative Medicine Team ? ? ? ? ?Subjective: Follow up visit for symptom check. ? ? ? ?Physical exam: Patient resting in bed with eyes closed. Respiratory rate increased at 26/minute, heart rate elevated per monitor. Patient breathing even and unlabored but tachypneac. Patient with some fidgeting in lower extremities but overall appears comfortable.  ? ? ? ?Assessment and plan: Discussed with bedside RN who was about to give morphine bolus to treat tachypnea. Will reach out to covering provider to increase or add additional PRN options for work of breathing. No other concerns received at this time.  ? ? ? ?Thank you for allowing the Palliative Medicine Team to assist in the care of this patient. ?  ?  ?Shelda Jakes, MSN, RN ?Palliative Medicine Team ?Team Phone: [phone]  ?This phone is monitored 7a-7p, please reach out to attending physician outside of these hours for urgent needs.   ?

## 2022-02-26 NOTE — Progress Notes (Signed)
? ?                                                                                                                                                     ?                                                   ?Daily Progress Note  ? ?Patient Name: Kelly Zuniga       Date: 02/26/2022 ?DOB: 1966/01/03  Age: 56 y.o. MRN#: [id number] ?Attending Physician: Geradine Girt, DO ?Primary Care Physician: Maximiano Coss, NP ?Admit Date: 02/22/2022 ? ?Reason for Consultation/Follow-up: Non pain symptom management, Pain control, Psychosocial/spiritual support, and Terminal Care ? ?Subjective: ?Chart review performed. Received report from primary RN - no acute concern. RN report patient is lethargic with no oral intake today. ? ?Went to visit patient at bedside - cousin/Allison present. Patient was lying in bed asleep - I did not attempt to wake her. Non-verbal gestures of discomfort noted. No respiratory distress, increased work of breathing, or secretions noted; however, patient was tachypneic with RR 36. ? ?Emotional support provided. Natural trajectory at EOL was reviewed with cousin. Family is aware patient was approved for [facility] but we are waiting for bed to become available. Symptom management plan reviewed. ? ?Removed continuous pulse ox from patient's head during my visit. ? ?All questions and concerns addressed. Encouraged to call with questions and/or concerns. PMT card provided. ? ?Requested RN provided PRN dose of morphine for discomfort/tachypnea and to remove oxygen and cardiac monitoring. ? ? ?Length of Stay: 4 ? ?Current Medications: ?Scheduled Meds:  ?? lamoTRIgine  100 mg Oral BID  ?? LORazepam  1 mg Intravenous Q6H  ??  morphine injection  2 mg Intravenous Q4H  ?? nicotine  14 mg Transdermal Daily  ?? sodium chloride flush  3 mL Intravenous Q12H  ? ? ?Continuous Infusions: ? ? ?PRN Meds: ?acetaminophen, albuterol, antiseptic oral rinse, glycopyrrolate **OR** glycopyrrolate **OR** glycopyrrolate, lidocaine,  lip balm, LORazepam, morphine injection, ondansetron **OR** ondansetron (ZOFRAN) IV, polyvinyl alcohol, senna-docusate ? ?Physical Exam ?Vitals and nursing note reviewed.  ?Constitutional:   ?   General: She is not in acute distress. ?   Appearance: She is ill-appearing.  ?Pulmonary:  ?   Effort: Tachypnea present. No respiratory distress.  ?Skin: ?   General: Skin is warm and dry.  ?Neurological:  ?   Mental Status: She is lethargic.  ?   Motor: Weakness present.  ?Psychiatric:     ?   Speech: She is noncommunicative.  ?         ? ?Vital Signs: BP (!) 145/64 (BP Location: Left Arm)   Pulse Marland Kitchen)  130   Temp 100 ?F (37.8 ?C) (Oral)   Resp (!) 29   Ht 5' (1.524 m)   Wt 38.6 kg   LMP 11/04/2013   SpO2 (!) 85%   BMI 16.60 kg/m?  ?SpO2: SpO2: (!) 85 % ?O2 Device: O2 Device: Nasal Cannula ?O2 Flow Rate: O2 Flow Rate (L/min): 2 L/min ? ?Intake/output summary: No intake or output data in the 24 hours ending 02/26/22 1444 ?LBM: Last BM Date : 02/24/22 ?Baseline Weight: Weight: 38.6 kg ?Most recent weight: Weight: 38.6 kg ? ?     ?Palliative Assessment/Data: PPS 10% ? ? ? ? ? ?Patient Active Problem List  ? Diagnosis Date Noted  ?? Protein-calorie malnutrition, severe 02/25/2022  ?? Acute on chronic intracranial subdural hematoma (HCC) 02/22/2022  ?? Hyponatremia 02/22/2022  ?? Hypokalemia 02/22/2022  ?? Depression with anxiety 02/22/2022  ?? Failure to thrive in adult 02/22/2022  ?? Chronic migraine w/o aura w/o status migrainosus, not intractable 01/08/2022  ?? Gait abnormality 01/08/2022  ?? Seizure-like activity ([place]) 04/17/2021  ?? Hypomagnesemia 02/10/2021  ?? Generalized anxiety disorder 02/10/2021  ?? Insomnia 02/10/2021  ?? Fall due to seizure [facility]) 02/10/2021  ?? Head injury 02/10/2021  ?? Macrocytic anemia 02/10/2021  ?? 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 Seizure ([place]) 02/09/2021  ?? Elevated LFTs 02/09/2021  ?? Substance abuse ([place]) 02/18/2013  ?? Alcohol dependence  ([place]) 02/18/2013  ?? Headache disorder 02/26/2012  ?? 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 MDD (major depressive disorder) 12/26/2010  ?? HTN (hypertension) 12/26/2010  ?? ALLERGIC RHINITIS 12/26/2010  ?? Asthma 12/26/2010  ? ? ?Palliative Care Assessment & Plan  ? ?Patient Profile: ?56 y.o. female  with past medical history of chronic alcohol use with suspected Wernicke's encephalopathy associated with gait abnormality, chronic migraines  and seizure-like activity on lamotrigine, macrocytic anemia, depression/anxiety, and chronic deconditioning with failure to thrive. She was admitted on 02/22/2022 with acute on chronic subdural hematoma in setting of seizure-like activity with presumed head injury.  ? ?Assessment: ?Acute on chronic intracranial subdural hematoma  ?Alcohol dependence  ?Hyponatremia ?Seizure-like activity  ?Macrocytic anemia ?Elevated LFTs ?Failure to thrive in adult ?Severe protein calorie malnutrition ?Terminal care ? ?Recommendations/Plan: ?Continue full comfort measures ?Continue DNR/DNI as previously documented ?[facility] when [facility] becomes available ?Adjusted scheduled 1-2mg  IV q6h morphine to 2mg  IV q4h; discontinued oral morphine; added PRN IV morphine dose for breakthrough symptoms ?Continue scheduled ativan q6h ?Discontinue continuous pulse ox and cardiac monitoring ?Transfer to 6N ?Diet liberalized for EOL ?Do not escalate/titrate oxygen based on saturations - see oxygen therapy order additional instruction ?PMT will continue to follow and support holistically ? ?Goals of Care and Additional Recommendations: ?Limitations on Scope of Treatment: Full Comfort Care ? ?Code Status: ? ?  ?Code Status Orders  ?(From admission, onward)  ?  ? ? ?  ? ?  Start     Ordered  ? 02/25/22 1531  Do not attempt resuscitation (DNR)  Continuous       ?Question Answer Comment  ?In the event of cardiac or respiratory ARREST Do not call a ?code blue?   ?In the event of cardiac or respiratory ARREST Do  not perform Intubation, CPR, defibrillation or ACLS   ?In the event of cardiac or respiratory ARREST Use medication by any route, position, wound care, and other measures to relive pain and suffering. May use oxygen, suction and manual treatment of airway obstruction as needed for comfort.   ?  ?  02/25/22 1535  ? ?  ?  ? ?  ? ?Code Status History   ? ? Date Active Date Inactive Code Status Order ID Comments User Context  ? 02/22/2022 2030 02/25/2022 1535 DNR [id number]  Lenore Cordia, MD ED  ? 02/09/2021 1755 02/12/2021 1706 Full Code [id number]  Neena Rhymes, MD ED  ? 02/18/2013 0001 02/23/2013 1655 Full Code [id number]  Nena Polio, PA-C Inpatient  ? 02/16/2013 1552 02/18/2013 0001 Full Code [id number]  Illene Labrador, PA-C ED  ? ?  ? ? ?Prognosis: ? < 2 weeks ? ?Discharge Planning: ?Hospice facility ? ?Care plan was discussed with primary RN, patient's cousin  ? ?Thank you for allowing the Palliative Medicine Team to assist in the care of this patient. ? ? ?Lin Landsman, NP ? ?Please contact Palliative Medicine Team phone at [phone] for questions and concerns.  ? ? ? ? ? ?

## 2022-02-27 MED ORDER — LORAZEPAM 2 MG/ML IJ SOLN
1.0000 mg | Freq: Four times a day (QID) | INTRAMUSCULAR | 0 refills | Status: AC
Start: 2022-02-27 — End: ?

## 2022-02-27 MED ORDER — MORPHINE SULFATE (PF) 2 MG/ML IV SOLN: 2.0000 mg | INTRAVENOUS | 0 refills | Status: AC | PRN

## 2022-02-27 MED ORDER — MORPHINE SULFATE (PF) 2 MG/ML IV SOLN: 2.0000 mg | INTRAVENOUS | 0 refills | Status: AC

## 2022-02-27 MED ORDER — LORAZEPAM 2 MG/ML IJ SOLN
1.0000 mg | INTRAMUSCULAR | 0 refills | Status: AC | PRN
Start: 2022-02-27 — End: ?

## 2022-02-27 MED ORDER — GLYCOPYRROLATE 0.2 MG/ML IJ SOLN: 0.2000 mg | INTRAMUSCULAR | Status: AC | PRN

## 2022-02-27 NOTE — TOC Transition Note (Signed)
Transition of Care (TOC) - CM/SW Discharge Note ? ? ?Patient Details  ?Name: Kelly Zuniga ?MRN: [id number] ?Date of Birth: 1966-07-06 ? ?Transition of Care (TOC) CM/SW Contact:  ?Beckie Busing, RN ?Phone Number:[phone] ? ?02/27/2022, 12:59 PM ? ? ?Clinical Narrative:    ?Patient discharging to [facility] Transportation has been set up with [facility]. D/C packet is at nurses station. No other TOC needs noted at this time.  ? ? ?  ?  ? ? ?Patient Goals and CMS Choice ?  ?  ?  ? ?Discharge Placement ?  ?           ?  ?  ?  ?  ? ?Discharge Plan and Services ?  ?  ?           ?  ?  ?  ?  ?  ?  ?  ?  ?  ?  ? ?Social Determinants of Health (SDOH) Interventions ?  ? ? ?Readmission Risk Interventions ?No flowsheet data found. ? ? ? ? ?

## 2022-02-27 NOTE — Progress Notes (Signed)
Patient resting comfortable, respirations unlabored 2L [place]. Will continue to monitor awaiting bed at residential hospice. ?

## 2022-02-27 NOTE — Discharge Summary (Signed)
Physician Discharge Summary   Patient: Kelly Zuniga MRN: [id number] DOB: 01/31/1966  Admit date:     02/22/2022  Discharge date: 02/27/22  Discharge Physician: Kelly Zuniga   PCP: Kelly Coss, NP   Recommendations at discharge:    Residential hospice  Discharge Diagnoses: Principal Problem:   Acute on chronic intracranial subdural hematoma (HCC) Active Problems:   Alcohol dependence (HCC)   Hyponatremia   Hypomagnesemia   Hypokalemia   Seizure-like activity (HCC)   Elevated LFTs   Macrocytic anemia   Chronic migraine w/o aura w/o status migrainosus, not intractable   Depression with anxiety   Failure to thrive in adult   Protein-calorie malnutrition, severe   Subdural bleeding ([place])   Palliative care by specialist   Terminal care   Decreased oral intake   DNR (do not resuscitate)   DNI (do not intubate)   Tachypnea   Hospital Course: Kelly Zuniga is a 56 y.o. female with medical history significant for chronic alcohol use with suspected Warnicke's encephalopathy associated with gait abnormality, chronic migraines and seizure-like activity on lamotrigine, macrocytic anemia, depression/anxiety, and chronic deconditioning with failure to thrive who is admitted with acute on chronic subdural hematoma in setting of seizure-like activity with presumed head injury.  Poor overall prognosis and mom would like to focus on comfort.  Transitioned to comfort care on 3/8.  Await [facility].  Assessment and Plan: * Acute on chronic intracranial subdural hematoma (HCC) CT head shows increased size of right cerebral hemisphere subdural hematoma.  New small volume of acute hemorrhage changes seen within this collection and overlying the anterior lateral aspect of the right temporal lobe.  May be related to reported trauma to head after fall at home.  She is not on any blood thinners. -transition to comfort care  Alcohol dependence ([place]) Patient with chronic daily alcohol use,  likely 4-5 drinks bourbon daily per family.  Last drink reportedly night of 3/4.  Has history of withdrawal.  Has suspected Warnicke's encephalopathy per outpatient neurology evaluation. -transition to comfort care  Hyponatremia -transition to comfort care  Hypokalemia -comfort care  Hypomagnesemia -comfort care  Seizure-like activity (HCC) Continue Lamictal as able  Macrocytic anemia -comfort care  Elevated LFTs -comfort care  Depression with anxiety -comfort care  Chronic migraine w/o aura w/o status migrainosus, not intractable Continue Lamictal.  Failure to thrive in adult Secondary to chronic alcohol use with protein calorie malnutrition, deconditioning, and presumed Warnicke's encephalopathy with gait instability. -CODE STATUS is DNR/DNI -poor overall prognosis--- will get palliative care consult as Mom would prefer to focus on comfort  Protein-calorie malnutrition, severe Nutrition Status: Nutrition Problem: Severe Malnutrition Etiology: chronic illness (ETOH abuse, FTT, Wernicke's encephalopathy) Signs/Symptoms: energy intake < or equal to 75% for > or equal to 1 month, moderate fat depletion, severe muscle depletion Interventions: Ensure Enlive (each supplement provides 350kcal and 20 grams of protein), MVI  -comfort care     Consultants: NS/palliative care \ Disposition: Hospice care Diet recommendation:  Discharge Diet Orders (From admission, onward)     Start     Ordered   02/27/22 0000  Diet general        02/27/22 0747           Regular diet DISCHARGE MEDICATION: Allergies as of 02/27/2022       Reactions   Acetaminophen Other (See Comments)   Makes LFT get elevated and causes liver problems/issues   Lithium Other (See Comments)   Causes the patient to have no  appetite   Promethazine-codeine Nausea And Vomiting, Other (See Comments)   Any codeine-based cough syrups cause N/V   Codeine Nausea And Vomiting   Morphine And Related Nausea  And Vomiting        Medication List     STOP taking these medications    albuterol (2.5 MG/3ML) 0.083% nebulizer solution Commonly known as: PROVENTIL   albuterol 108 (90 Base) MCG/ACT inhaler Commonly known as: VENTOLIN HFA   amoxicillin-clavulanate 875-125 MG tablet Commonly known as: AUGMENTIN   BIOTIN PO   cetirizine 10 MG tablet Commonly known as: ZYRTEC   clonazePAM 0.5 MG tablet Commonly known as: KLONOPIN   escitalopram 20 MG tablet Commonly known as: LEXAPRO   Lotemax SM 0.38 % Gel Generic drug: Loteprednol Etabonate   magnesium gluconate 500 MG tablet Commonly known as: MAGONATE   Multi Adult Gummies Chew   naproxen sodium 220 MG tablet Commonly known as: ALEVE   omeprazole 20 MG capsule Commonly known as: PRILOSEC   SUPER B COMPLEX/VITAMIN C PO   thiamine 100 MG tablet   triamcinolone 55 MCG/ACT nasal inhaler Commonly known as: NASACORT       TAKE these medications    glycopyrrolate 0.2 MG/ML injection Commonly known as: ROBINUL Inject 1 mL (0.2 mg total) into the skin every 4 (four) hours as needed (excessive secretions).   lamoTRIgine 100 MG tablet Commonly known as: LaMICtal Take 1 tablet (100 mg total) by mouth 2 (two) times daily.   LORazepam 2 MG/ML injection Commonly known as: ATIVAN Inject 0.5 mLs (1 mg total) into the vein every 6 (six) hours.   LORazepam 2 MG/ML injection Commonly known as: ATIVAN Inject 0.5 mLs (1 mg total) into the vein every hour as needed for anxiety or seizure (distress).   morphine (PF) 2 MG/ML injection Inject 1 mL (2 mg total) into the vein every 2 (two) hours as needed (dyspnea, increased work of breathing, RR >25, distress).   morphine (PF) 2 MG/ML injection Inject 1 mL (2 mg total) into the vein every 4 (four) hours.        Discharge Exam: Filed Weights   02/22/22 1723  Weight: 38.6 kg     Condition at discharge:  terminal  The results of significant diagnostics from this  hospitalization (including imaging, microbiology, ancillary and laboratory) are listed below for reference.   Imaging Studies: CT Head Wo Contrast  Result Date: 02/22/2022 CLINICAL DATA:  Mental status change, unknown cause, unclear if head injury. Fall today. Generalized weakness. EXAM: CT HEAD WITHOUT CONTRAST TECHNIQUE: Contiguous axial images were obtained from the base of the skull through the vertex without intravenous contrast. RADIATION DOSE REDUCTION: This exam was performed according to the departmental dose-optimization program which includes automated exposure control, adjustment of the mA and/or kV according to patient size and/or use of iterative reconstruction technique. COMPARISON:  Prior head CT examinations 11/22/2021 and earlier. FINDINGS: Brain: Mild generalized cerebral and cerebellar atrophy. A subdural collection overlying the right cerebral hemisphere has increased in size as compared to the head CT of 11/22/2021 (now measuring up to 12 mm in greatest thickness). This is compatible with a hematoma. The majority of the collection is low to intermediate density. However, new from the prior head CT, there is small volume acute hemorrhage within the collection (for instance as seen on series 2, image 19). Mild mass effect upon the underlying right cerebral hemisphere without midline shift. Additionally, there is small volume acute hemorrhage overlying the anterolateral aspect of the right  temporal lobe, which may be subdural or subarachnoid (series 2, image 7). Mild patchy and ill-defined hypoattenuation within the cerebral white matter, nonspecific but compatible chronic small vessel ischemic disease No demarcated cortical infarct. No evidence of an intracranial mass. Vascular: No hyperdense vessel.  Atherosclerotic calcifications. Skull: Normal. Negative for fracture or focal lesion. Sinuses/Orbits: Visualized orbits show no acute finding. Mild mucosal thickening within the left maxillary  sinus at the imaged levels. These results were called by telephone at the time of interpretation on 02/22/2022 at 6:35 pm to provider MATTHEW TRIFAN , who verbally acknowledged these results. IMPRESSION: A subdural collection overlying the right cerebral hemisphere has increased in size since the prior head CT of 11/22/2021 (now measuring up to 12 mm in greatest thickness). This is compatible with a subdural hematoma. The majority of the collection is low-to-intermediate density. However, new from the prior head CT, there is small-volume hyperdense acute hemorrhage within portions of the collection. Mild mass effect upon the underlying right cerebral hemisphere without midline shift. Additionally, there is small-volume acute hemorrhage overlying the anterolateral aspect of the right temporal lobe, which may be subdural or subarachnoid. Mild chronic small vessel ischemic changes within the cerebral white matter. Mild general parenchymal atrophy. Mild mucosal thickening within the left maxillary sinus at the imaged levels. Electronically Signed   By: Kellie Simmering D.O.   On: 02/22/2022 18:39   DG CHEST PORT 1 VIEW  Result Date: 02/23/2022 CLINICAL DATA:  Fever. EXAM: PORTABLE CHEST 1 VIEW COMPARISON:  CT chest dated November 22, 2021. Chest x-ray dated August 26, 2020. FINDINGS: The heart size and mediastinal contours are within normal limits. Mild patchy opacity at both lung bases. No pleural effusion or pneumothorax. Chronic blunting/scarring at the left costophrenic angle. No acute osseous abnormality. IMPRESSION: 1. Bibasilar atelectasis versus pneumonia. Electronically Signed   By: Titus Dubin M.D.   On: 02/23/2022 09:20   DG Swallowing Func-Speech Pathology  Result Date: 02/24/2022 Table formatting from the original result was not included. Objective Swallowing Evaluation: Type of Study: MBS-Modified Barium Swallow Study  Patient Details Name: Tamiracle Dianna MRN: [id number] Date of Birth: January 21, 1966  Today's Date: 02/24/2022 Time: SLP Start Time (ACUTE ONLY): 1305 -SLP Stop Time (ACUTE ONLY): 1330 SLP Time Calculation (min) (ACUTE ONLY): 25 min Past Medical History: Past Medical History: Diagnosis Date  Alcohol abuse   Allergy   Anemia   low iron  Anxiety   Asthma   Asthma   [employer] 11/09/2020  Depression   posibly bipolar disorder. 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 female with medical history significant for chronic alcohol use with suspected Warnicke's encephalopathy associated with gait abnormality, chronic migraines and seizure-like activity on lamotrigine, macrocytic anemia, depression/anxiety, and chronic deconditioning with failure to thrive who is admitted with acute on chronic subdural hematoma in setting  of seizure-like activity with presumed head injury.  Subjective: alert, cooperative  Recommendations for follow up therapy are one component of a multi-disciplinary discharge planning process, led by the attending physician.  Recommendations may be updated based on patient status, additional functional criteria and insurance authorization. Assessment / Plan / Recommendation Clinical Impressions 02/24/2022 Clinical Impression Patient presents with a mild oropharyngeal dysphagia as per this MBS. During oral phase, patient exhibited mildly  delayed oral transit of puree consistency and barium tablet with thin liquid barium but full clearance of oral cavity post initial swallow. During pharyngeal phase of swallow, patient exhibited very mild swallow initiation delay at level of vallecular sinus with thin liquids only. After initial swallows, patient exhibited trace-mild vallecular residual with puree solids,  trace vallecular and trace pyriform residuals with thin and nectar thick liquid barium. Patient did exhibit instances of flash penetration during the swallow (PAS 2)  with cup sips of thin liquid barium, but penetrate fully cleared laryngeal vestibule and no aspiration occured with any of the tested barium consistencies.1/2 barium tablet transited pharyngeally without difficulty and esophageal sweep was unremarkable. SLP is recommending to initiate Dys 2 (fine chop) solids and thin liquids. SLP will continue to follow patient for diet toleration. SLP Visit Diagnosis Dysphagia, oropharyngeal phase (R13.12) Attention and concentration deficit following -- Frontal lobe and executive function deficit following -- Impact on safety and function Mild aspiration risk   Treatment Recommendations 02/24/2022 Treatment Recommendations Therapy as outlined in treatment plan below   Prognosis 02/24/2022 Prognosis for Safe Diet Advancement Good Barriers to Reach Goals Cognitive deficits Barriers/Prognosis Comment -- Diet Recommendations 02/24/2022 SLP Diet Recommendations Dysphagia 2 (Fine chop) solids;Thin liquid Liquid Administration via Cup;Straw Medication Administration Other (Comment) Compensations Slow rate;Small sips/bites Postural Changes Seated upright at 90 degrees   Other Recommendations 02/24/2022 Recommended Consults -- Oral Care Recommendations Oral care BID Other Recommendations -- Follow Up Recommendations No SLP follow up Assistance recommended at discharge Set up Supervision/Assistance Functional Status Assessment Patient has had a recent decline in  their functional status and demonstrates the ability to make significant improvements in function in a reasonable and predictable amount of time. Frequency and Duration  02/24/2022 Speech Therapy Frequency (ACUTE ONLY) min 1 x/week Treatment Duration 1 week   Oral Phase 02/24/2022 Oral Phase Impaired Oral - Pudding Teaspoon -- Oral - Pudding Cup -- Oral - Honey Teaspoon -- Oral - Honey Cup -- Oral - Nectar Teaspoon -- Oral - Nectar Cup WFL Oral - Nectar Straw -- Oral - Thin Teaspoon -- Oral - Thin Cup WFL Oral - Thin Straw -- Oral - Puree Delayed oral transit Oral - Mech Soft -- Oral - Regular -- Oral - Multi-Consistency -- Oral - Pill Decreased bolus cohesion;Delayed oral transit Oral Phase - Comment --  Pharyngeal Phase 02/24/2022 Pharyngeal Phase Impaired Pharyngeal- Pudding Teaspoon -- Pharyngeal -- Pharyngeal- Pudding Cup -- Pharyngeal -- Pharyngeal- Honey Teaspoon -- Pharyngeal -- Pharyngeal- Honey Cup -- Pharyngeal -- Pharyngeal- Nectar Teaspoon -- Pharyngeal -- Pharyngeal- Nectar Cup Pharyngeal residue - valleculae;Pharyngeal residue - pyriform Pharyngeal -- Pharyngeal- Nectar Straw -- Pharyngeal -- Pharyngeal- Thin Teaspoon -- Pharyngeal -- Pharyngeal- Thin Cup Delayed swallow initiation-vallecula;Pharyngeal residue - valleculae;Pharyngeal residue - pyriform;Penetration/Aspiration during swallow;Reduced airway/laryngeal closure Pharyngeal Material enters airway, remains ABOVE vocal cords then ejected out Pharyngeal- Thin Straw -- Pharyngeal -- Pharyngeal- Puree Pharyngeal residue - valleculae Pharyngeal -- Pharyngeal- Mechanical Soft -- Pharyngeal -- Pharyngeal- Regular -- Pharyngeal -- Pharyngeal- Multi-consistency -- Pharyngeal -- Pharyngeal- Pill WFL Pharyngeal -- Pharyngeal Comment --  Cervical Esophageal Phase  02/24/2022 Cervical Esophageal Phase Impaired Pudding Teaspoon -- Pudding Cup -- Honey Teaspoon -- Honey Cup -- Nectar Teaspoon -- Nectar Cup -- Nectar Straw -- Thin Teaspoon -- Thin Cup -- Thin Straw  -- Puree -- Mechanical Soft -- Regular -- Multi-consistency -- Pill -- Cervical Esophageal Comment -- Sonia Baller, MA, CCC-SLP Speech Therapy                     US Abdomen Limited RUQ (LIVER/GB)  Result Date: 02/23/2022 CLINICAL DATA:  Elevated liver function tests EXAM: ULTRASOUND ABDOMEN LIMITED RIGHT UPPER QUADRANT COMPARISON:  03/18/2021, 04/15/2021 FINDINGS: Gallbladder: Gallbladder wall thickness of 3.8 mm. No gallstones or pericholecystic fluid visualized. A positive sonographic Percell Miller sign was noted by sonographer. Common bile duct: Diameter: 7 mm. Liver: No focal lesion identified. Diffusely increased hepatic parenchymal echogenicity. Portal vein is patent on color Doppler imaging with normal direction of blood flow towards the liver. Other: None. IMPRESSION: 1. Mild gallbladder wall thickening with positive sonographic Murphy sign on exam. No biliary sludge or gallstones. Findings raise the possibility of acalculous cholecystitis. If further imaging evaluation is clinically warranted, a nuclear medicine hepatobiliary scan could be obtained to assess the patency of the cystic duct and common bile duct. 2. Hepatic steatosis. Electronically Signed   By: Davina Poke D.O.   On: 02/23/2022 10:37    Microbiology: Results for orders placed or performed during the hospital encounter of 02/22/22  Resp Panel by RT-PCR (Flu A&B, Covid) Nasopharyngeal Swab     Status: None   Collection Time: 02/22/22  6:02 PM   Specimen: Nasopharyngeal Swab; Nasopharyngeal(NP) swabs in vial transport medium  Result Value Ref Range Status   SARS Coronavirus 2 by RT PCR NEGATIVE NEGATIVE Final    Comment: (NOTE) SARS-CoV-2 target nucleic acids are NOT DETECTED.  The SARS-CoV-2 RNA is generally detectable in upper respiratory specimens during the acute phase of infection. The lowest concentration of SARS-CoV-2 viral copies this assay can detect is 138 copies/mL. A negative result does not preclude  SARS-Cov-2 infection and should not be used as the sole basis for treatment or other patient management decisions. A negative result may occur with  improper specimen collection/handling, submission of specimen other than nasopharyngeal swab, presence of viral mutation(s) within the areas targeted by this assay, and inadequate number of viral copies(<138 copies/mL). A negative result must be combined with clinical observations, patient history, and epidemiological information. The expected result is Negative.  Fact Sheet for Patients:  EntrepreneurPulse.com.au  Fact Sheet for Healthcare Providers:  IncredibleEmployment.be  This test is no t yet approved or cleared by the [place] FDA and  has been authorized for detection and/or diagnosis of SARS-CoV-2 by FDA under an Emergency Use Authorization (EUA). This EUA will remain  in effect (meaning this test can be used) for the duration of the COVID-19 declaration under Section 564(b)(1) of the Act, 21 U.S.C.section 360bbb-3(b)(1), unless the authorization is terminated  or revoked sooner.       Influenza A by PCR NEGATIVE NEGATIVE Final   Influenza B by PCR NEGATIVE NEGATIVE Final    Comment: (NOTE) The Xpert Xpress SARS-CoV-2/FLU/RSV plus assay is intended as an aid in the diagnosis of influenza from Nasopharyngeal swab specimens and should not be used as a sole basis for treatment. Nasal washings and aspirates are unacceptable for Xpert Xpress SARS-CoV-2/FLU/RSV testing.  Fact Sheet for Patients: EntrepreneurPulse.com.au  Fact Sheet for Healthcare Providers: IncredibleEmployment.be  This test is not  yet approved or cleared by the [place] and has been authorized for detection and/or diagnosis of SARS-CoV-2 by FDA under an Emergency Use Authorization (EUA). This EUA will remain in effect (meaning this test can be used) for the duration of  the COVID-19 declaration under Section 564(b)(1) of the Act, 21 U.S.C. section 360bbb-3(b)(1), unless the authorization is terminated or revoked.  Performed at [facility], [place] [address], [place], [place] [address]   MRSA Next Gen by PCR, Nasal     Status: None   Collection Time: 02/22/22 10:40 PM   Specimen: Nasal Mucosa; Nasal Swab  Result Value Ref Range Status   MRSA by PCR Next Gen NOT DETECTED NOT DETECTED Final    Comment: (NOTE) The GeneXpert MRSA Assay (FDA approved for NASAL specimens only), is one component of a comprehensive MRSA colonization surveillance program. It is not intended to diagnose MRSA infection nor to guide or monitor treatment for MRSA infections. Test performance is not FDA approved in patients less than 17 years old. Performed at [facility], [place] [address] [place], [place] [address]   Culture, blood (routine x 2)     Status: None (Preliminary result)   Collection Time: 02/23/22  9:14 AM   Specimen: BLOOD  Result Value Ref Range Status   Specimen Description BLOOD RIGHT ANTECUBITAL  Final   Special Requests   Final    BOTTLES DRAWN AEROBIC AND ANAEROBIC Blood Culture adequate volume   Culture   Final    NO GROWTH 3 DAYS Performed at [facility], [place] [address], [place], [place] [address]    Report Status PENDING  Incomplete  Culture, blood (routine x 2)     Status: None (Preliminary result)   Collection Time: 02/23/22  9:23 AM   Specimen: BLOOD  Result Value Ref Range Status   Specimen Description BLOOD RIGHT ANTECUBITAL  Final   Special Requests   Final    BOTTLES DRAWN AEROBIC AND ANAEROBIC Blood Culture adequate volume   Culture   Final    NO GROWTH 3 DAYS Performed at [facility], [place] [address] [place], [place] [address]    Report Status PENDING  Incomplete    Labs: CBC: Recent Labs  Lab 02/22/22 1800 02/22/22 2238 02/24/22 0329 02/25/22 0651  WBC 10.5 9.9 7.4 9.9  NEUTROABS 7.9*   --   --   --   HGB 9.8* 9.3* 9.8* 10.3*  HCT 29.5* 27.5* 28.9* 29.4*  MCV 101.0* 101.1* 100.3* 97.0  PLT 261 256 206 [id number]   Basic Metabolic Panel: Recent Labs  Lab 02/22/22 1800 02/22/22 2238 02/24/22 0329 02/25/22 0651  NA 129* 129* 133* 126*  K 3.0* 3.1* 5.0 3.7  CL 88* 93* 100 94*  CO2 22 23 21* 24  GLUCOSE 95 114* 84 93  BUN 9 7 <5* 5*  CREATININE 0.40* 0.70 0.56 0.42*  CALCIUM 7.6* 7.3* 7.5* 7.5*  MG 1.3* 2.0 1.3* 1.5*  PHOS  --   --  1.8* 3.2   Liver Function Tests: Recent Labs  Lab 02/22/22 1800 02/22/22 2238 02/24/22 0329 02/25/22 0651  AST 167* 157* 230* 200*  ALT 70* 66* 90* 107*  ALKPHOS 358* 327* 446* 502*  BILITOT 1.8* 1.7* 1.7* 1.9*  PROT 5.1* 4.5* 4.2* 4.0*  ALBUMIN 2.8* 2.3* 2.2* 2.0*   CBG: Recent Labs  Lab 02/22/22 2155  GLUCAP 116*    Discharge time spent: greater than 30 minutes.  Signed: Janett Billow  Alison Stalling, DO [facility] 02/27/2022

## 2022-02-27 NOTE — Progress Notes (Signed)
[facility] ([facility]) Hospital Liaison note.     This patient is approved to transfer to [facility] today.    [facility] will notify TOC when registration paperwork has been completed to arrange transport.    RN please call report to [phone].   Thank you,     Mary Anne Robertson, RN, CCM       [facility] Hospital Liaison  [phone] 

## 2022-02-27 NOTE — Progress Notes (Signed)
? ?                                                                                                                                                     ?                                                   ?Daily Progress Note  ? ?Patient Name: Kelly Zuniga       Date: 02/27/2022 ?DOB: 09-17-1966  Age: 56 y.o. MRN#: [id number] ?Attending Physician: Joseph Art, DO ?Primary Care Physician: [name], NP ?Admit Date: 02/22/2022 ? ?Reason for Consultation/Follow-up: Non pain symptom management, Pain control, Psychosocial/spiritual support, and Terminal Care ? ?Subjective: ?Chart review performed. Received report from primary RN - no acute concerns. ? ?Went to visit patient at bedside - no family/visitors present. Patient was lying in bed asleep - she does not wake to voice/gentle touch. No signs or non-verbal gestures of pain or discomfort noted. No respiratory distress, apnea, or secretions noted; increased work of breathing and tachypnea noted. RR 40/min - noted it was time for scheduled morphine and ativan - confirmed with RN she will administer shortly.  ? ?Removed pulse ox monitor from forehead.  ? ?At this time, patient does look stable for transfer; however, window for low risk transfer is likely closing soon. Provided update that [facility] does have a bed for patient today. ? ? ?Length of Stay: 5 ? ?Current Medications: ?Scheduled Meds:  ?? lamoTRIgine  100 mg Oral BID  ?? LORazepam  1 mg Intravenous Q6H  ??  morphine injection  2 mg Intravenous Q4H  ?? nicotine  14 mg Transdermal Daily  ?? sodium chloride flush  3 mL Intravenous Q12H  ? ? ?Continuous Infusions: ? ? ?PRN Meds: ?acetaminophen, albuterol, antiseptic oral rinse, glycopyrrolate **OR** glycopyrrolate **OR** glycopyrrolate, lidocaine, lip balm, LORazepam, morphine injection, ondansetron **OR** ondansetron (ZOFRAN) IV, polyvinyl alcohol, senna-docusate ? ?Physical Exam ?Vitals and nursing note reviewed.  ?Constitutional:   ?   General: She  is not in acute distress. ?   Appearance: She is ill-appearing.  ?Pulmonary:  ?   Effort: Tachypnea present. No respiratory distress.  ?Skin: ?   General: Skin is warm and dry.  ?Neurological:  ?   Mental Status: She is lethargic.  ?   Motor: Weakness present.  ?Psychiatric:     ?   Speech: She is noncommunicative.  ?         ? ?Vital Signs: BP 113/68 (BP Location: Left Arm)   Pulse (!) 128   Temp 99.6 ?F (37.6 ?C) (Oral)   Resp (!) 26   Ht 5' (1.524 m)   Wt 38.6 kg  LMP 11/04/2013   SpO2 (!) 67%   BMI 16.60 kg/m?  ?SpO2: SpO2: (!) 67 % ?O2 Device: O2 Device: Nasal Cannula ?O2 Flow Rate: O2 Flow Rate (L/min): 2 L/min ? ?Intake/output summary:  ?Intake/Output Summary (Last 24 hours) at 02/27/2022 1117 ?Last data filed at 02/26/2022 2222 ?Gross per 24 hour  ?Intake 3 ml  ?Output --  ?Net 3 ml  ? ?LBM: Last BM Date : 02/24/22 ?Baseline Weight: Weight: 38.6 kg ?Most recent weight: Weight: 38.6 kg ? ?     ?Palliative Assessment/Data: PPS 10% ? ? ? ? ? ?Patient Active Problem List  ? Diagnosis Date Noted  ?? Subdural bleeding (HCC)   ?? Palliative care by specialist   ?? Terminal care   ?? Decreased oral intake   ?? DNR (do not resuscitate)   ?? DNI (do not intubate)   ?? Tachypnea   ?? Protein-calorie malnutrition, severe 02/25/2022  ?? Acute on chronic intracranial subdural hematoma (HCC) 02/22/2022  ?? Hyponatremia 02/22/2022  ?? Hypokalemia 02/22/2022  ?? Depression with anxiety 02/22/2022  ?? Failure to thrive in adult 02/22/2022  ?? Chronic migraine w/o aura w/o status migrainosus, not intractable 01/08/2022  ?? Gait abnormality 01/08/2022  ?? Seizure-like activity (HCC) 04/17/2021  ?? Hypomagnesemia 02/10/2021  ?? Generalized anxiety disorder 02/10/2021  ?? Insomnia 02/10/2021  ?? Fall due to seizure [facility]) 02/10/2021  ?? Head injury 02/10/2021  ?? Macrocytic anemia 02/10/2021  ?? 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 Seizure (HCC) 02/09/2021  ?? Elevated LFTs 02/09/2021   ?? Substance abuse (HCC) 02/18/2013  ?? Alcohol dependence (HCC) 02/18/2013  ?? Headache disorder 02/26/2012  ?? 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 MDD (major depressive disorder) 12/26/2010  ?? HTN (hypertension) 12/26/2010  ?? ALLERGIC RHINITIS 12/26/2010  ?? Asthma 12/26/2010  ? ? ?Palliative Care Assessment & Plan  ? ?Patient Profile: ?56 y.o. female  with past medical history of chronic alcohol use with suspected Wernicke's encephalopathy associated with gait abnormality, chronic migraines  and seizure-like activity on lamotrigine, macrocytic anemia, depression/anxiety, and chronic deconditioning with failure to thrive. She was admitted on 02/22/2022 with acute on chronic subdural hematoma in setting of seizure-like activity with presumed head injury.  ? ?Assessment: ?Acute on chronic intracranial subdural hematoma  ?Alcohol dependence  ?Hyponatremia ?Seizure-like activity  ?Macrocytic anemia ?Elevated LFTs ?Failure to thrive in adult ?Severe protein calorie malnutrition ?Terminal care ? ?Recommendations/Plan: ?Continue full comfort measures ?Continue DNR/DNI as previously documented - durable DNR on chart for transfer ?Transfer to [facility] today - bed available per hospice liaison. Patient's window for low risk transfer is likely closing soon, if transfer doesn't happen by later this evening she would likely benefit from remaining in house for EOL care ?Continue scheduled morphine q4h and ativan q6h ?Discontinued Lamictal as patient is no longer tolerating POs ?Discontinued nicotine patch - has not received x48h and is already on scheduled ativan with PRN doses available ?PMT will continue to follow and support holistically ? ?Goals of Care and Additional Recommendations: ?Limitations on Scope of Treatment: Full Comfort Care ? ?Code Status: ? ?  ?Code Status Orders  ?(From admission, onward)  ?  ? ? ?  ? ?  Start     Ordered  ? 02/25/22 1531  Do not attempt resuscitation (DNR)  Continuous       ?Question  Answer Comment  ?In the event of cardiac or respiratory ARREST Do not call a ?code blue?   ?In the event of cardiac  or respiratory ARREST Do not perform Intubation, CPR, defibrillation or ACLS   ?In the event of cardiac or respiratory ARREST Use medication by any route, position, wound care, and other measures to relive pain and suffering. May use oxygen, suction and manual treatment of airway obstruction as needed for comfort.   ?  ? 02/25/22 1535  ? ?  ?  ? ?  ? ?Code Status History   ? ? Date Active Date Inactive Code Status Order ID Comments User Context  ? 02/22/2022 2030 02/25/2022 1535 DNR [id number]  Charlsie QuestPatel, Vishal R, MD ED  ? 02/09/2021 1755 02/12/2021 1706 Full Code [id number]  Jacques NavyNorins, Michael E, MD ED  ? 02/18/2013 0001 02/23/2013 1655 Full Code [id number]  Verne SpurrMashburn, Neil, PA-C Inpatient  ? 02/16/2013 1552 02/18/2013 0001 Full Code [id number]  Trevor MaceAlbert, Robyn M, PA-C ED  ? ?  ? ? ?Prognosis: ? Likely days ? ?Discharge Planning: ?Hospice facility ? ?Care plan was discussed with primary RN, Dr. Benjamine MolaVann, hospice liaison, TOC ? ?Thank you for allowing the Palliative Medicine Team to assist in the care of this patient. ? ? ?Haskel KhanAmber M Antonyo Hinderer, NP ? ?Please contact Palliative Medicine Team phone at [phone] for questions and concerns.  ? ? ? ? ? ?

## 2022-02-28 LAB — CULTURE, BLOOD (ROUTINE X 2)
Culture: NO GROWTH
Culture: NO GROWTH
Special Requests: ADEQUATE
Special Requests: ADEQUATE

## 2022-03-21 DEATH — deceased

## 2022-05-19 ENCOUNTER — Ambulatory Visit: Payer: No Typology Code available for payment source | Admitting: Registered Nurse

## 2022-05-24 IMAGING — US US ABDOMEN LIMITED
1 series · 14 of 25 positions shown · non-contrast
Comparison: 03/18/2021, 04/15/2021

CLINICAL DATA: Elevated liver function tests

EXAM:
ULTRASOUND ABDOMEN LIMITED RIGHT UPPER QUADRANT

[Series 1: us abdomen limited ruq (liver/gb) · 14 of 38 slices shown]
[im 1/38]
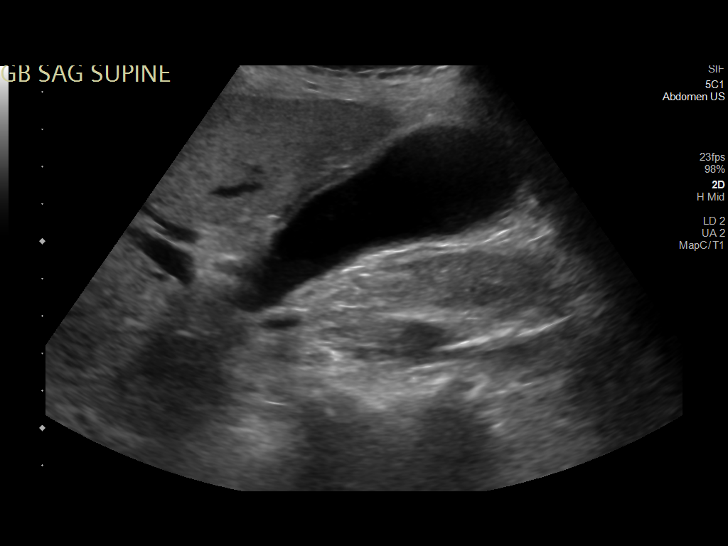
[im 4/38]
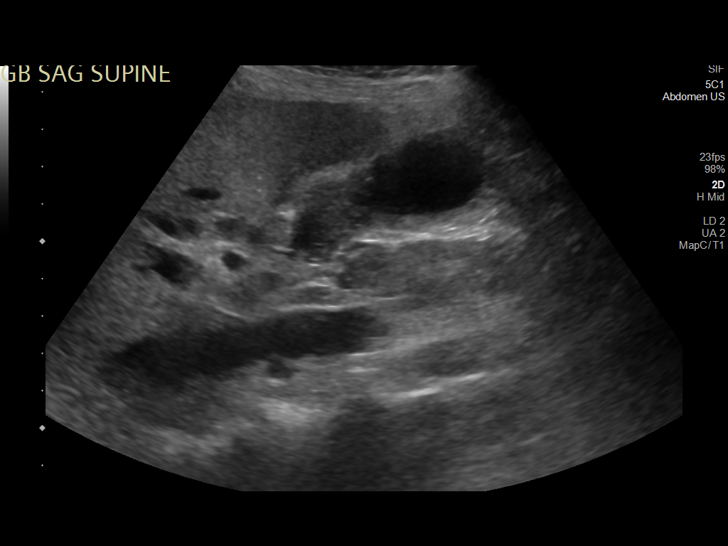
[im 7/38]
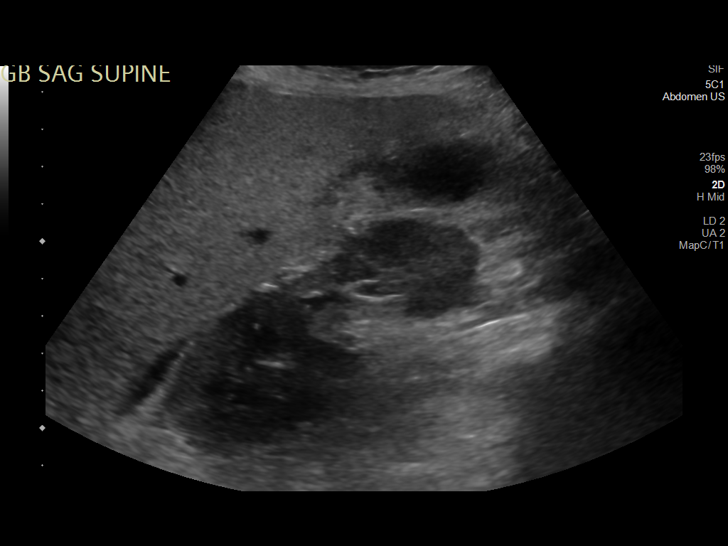
[im 10/38]
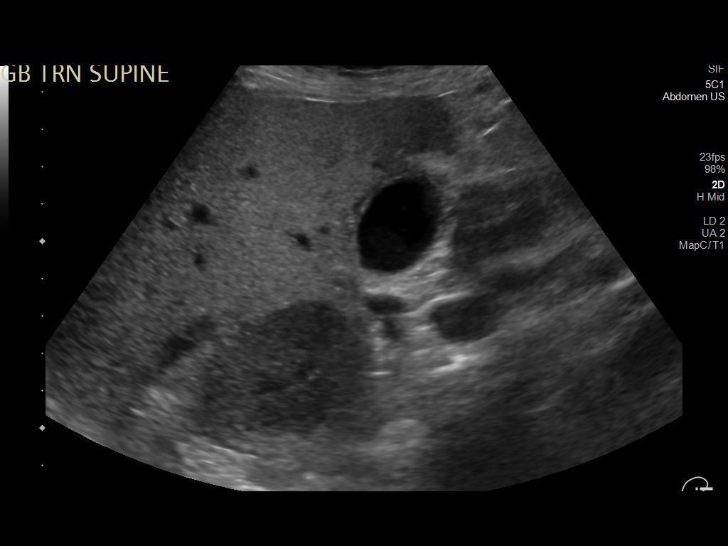
[im 13/38]
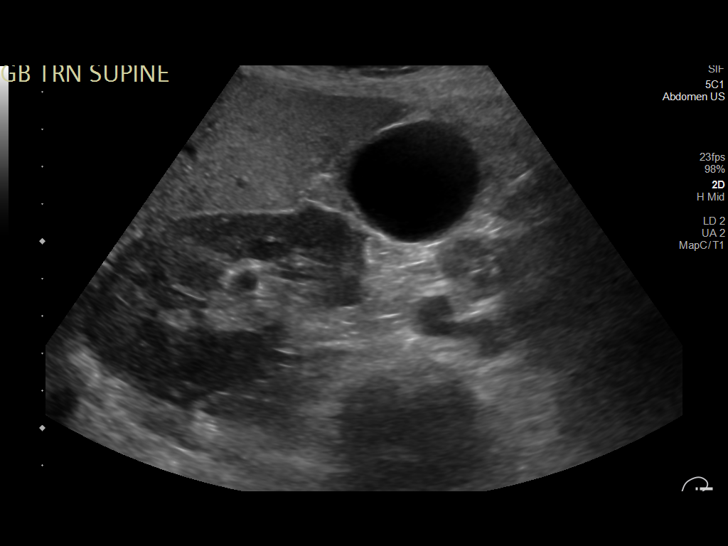
[im 14/38]
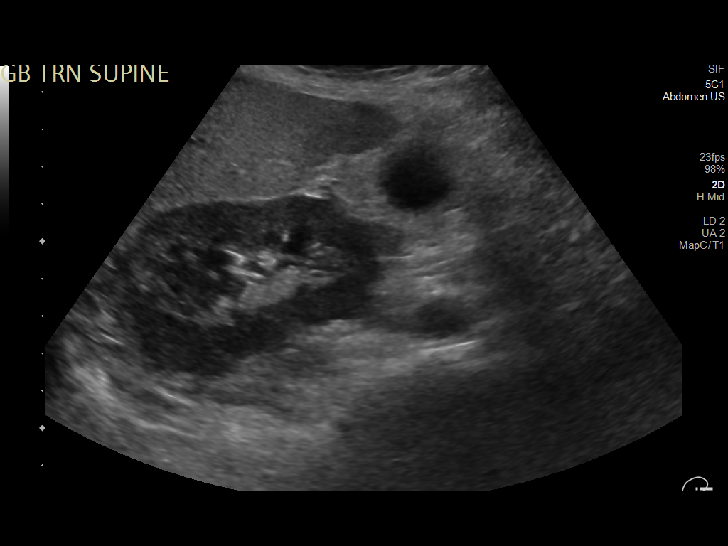
[im 17/38]
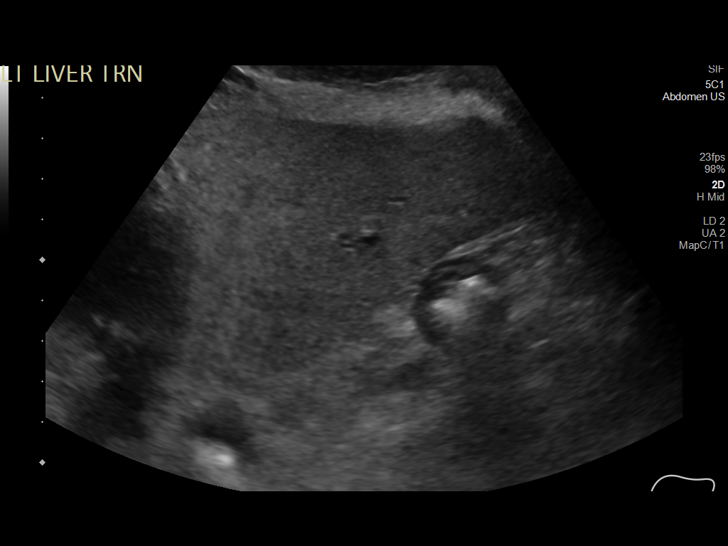
[im 21/38]
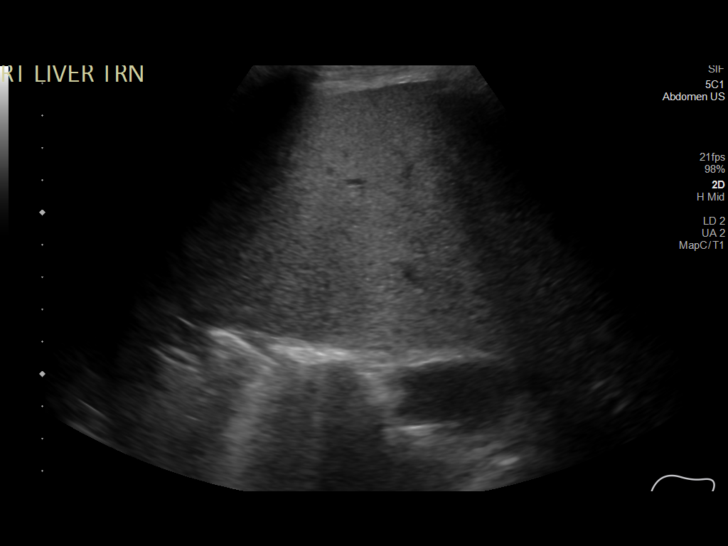
[im 24/38]
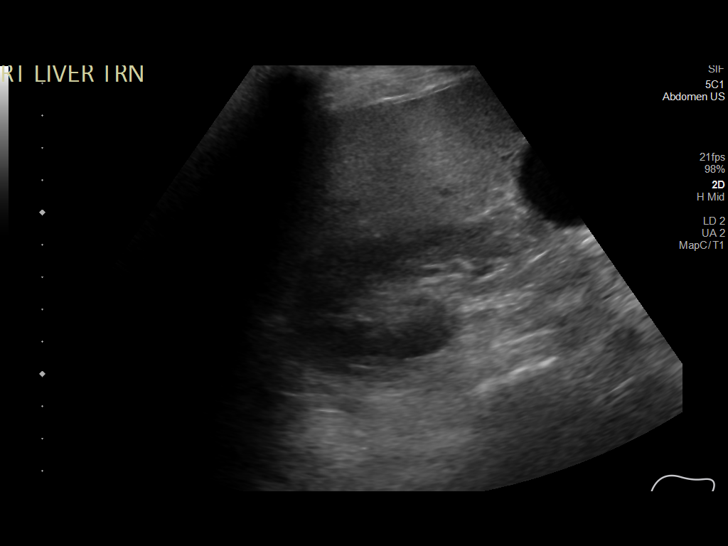
[im 25/38]
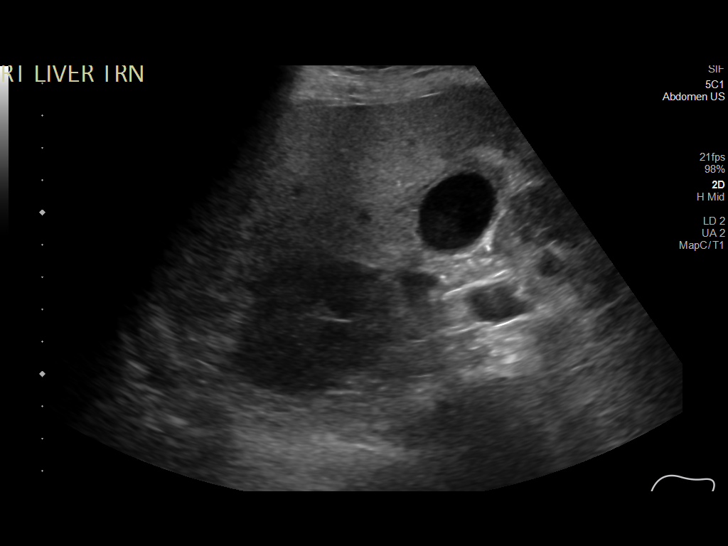
[im 28/38]
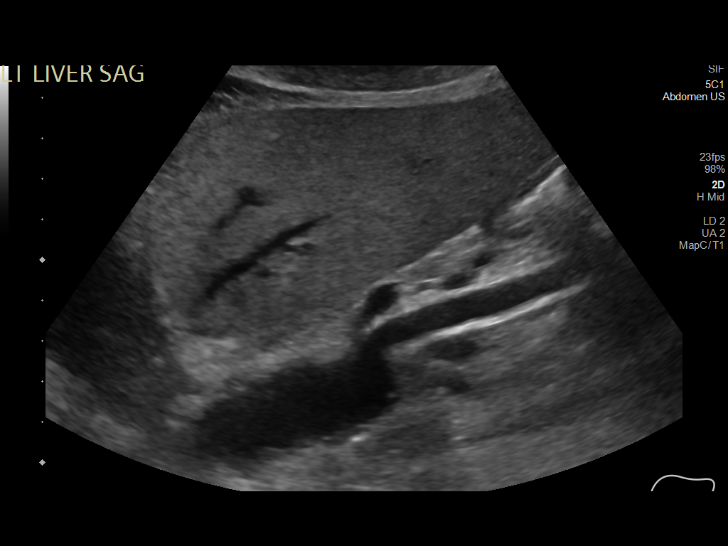
[im 31/38]
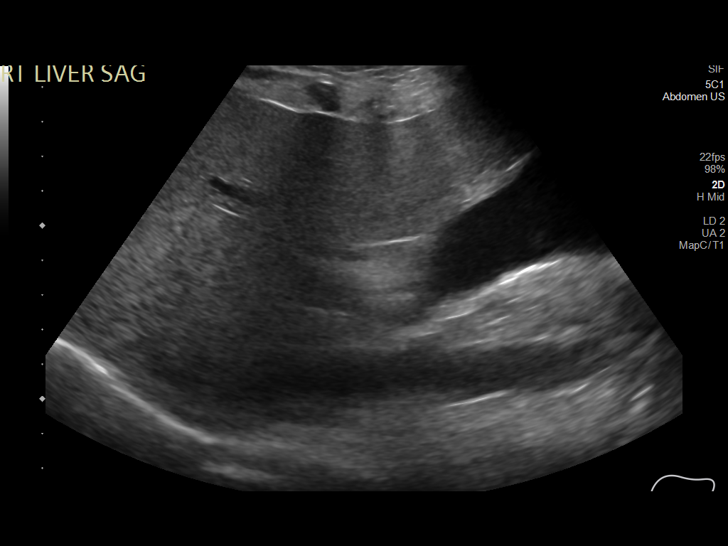
[im 34/38]
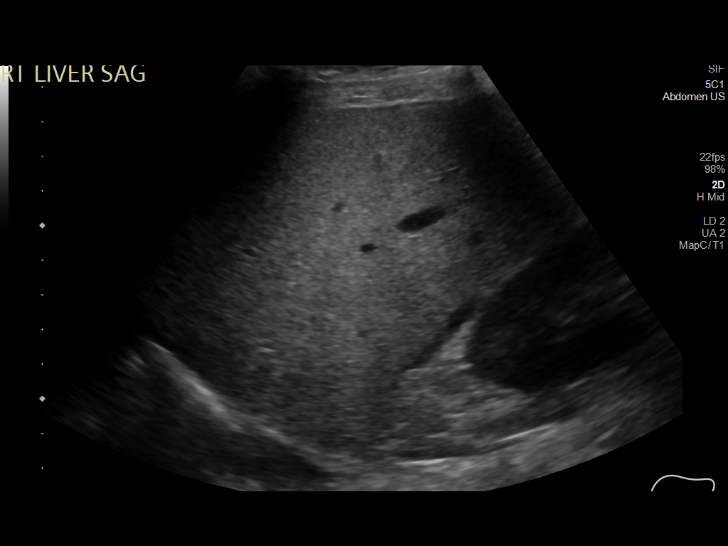
[im 38/38]
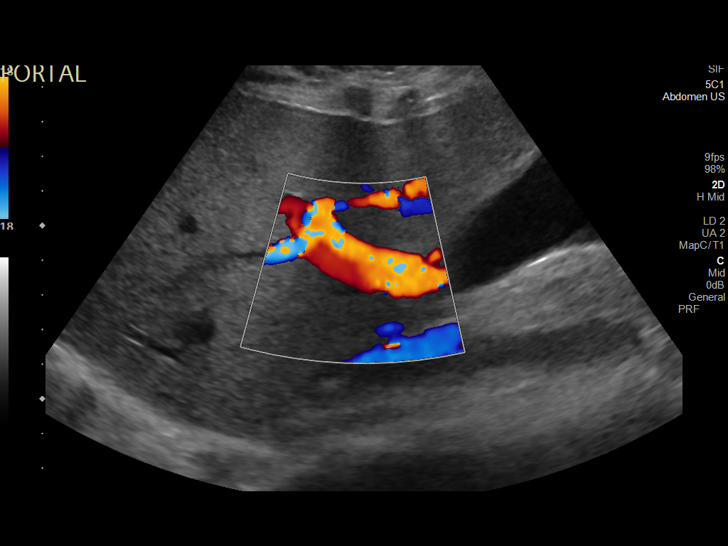

[14 of 25 positions shown; findings below may reference images not displayed]

FINDINGS: Gallbladder:

Gallbladder wall thickness of 3.8 mm. No gallstones or
pericholecystic fluid visualized. A positive sonographic Murphy sign
was noted by sonographer.

Common bile duct:

Diameter: 7 mm.

Liver:

No focal lesion identified. Diffusely increased hepatic parenchymal
echogenicity. Portal vein is patent on color Doppler imaging with
normal direction of blood flow towards the liver.

Other: None.
IMPRESSION: 1. Mild gallbladder wall thickening with positive sonographic Murphy
sign on exam. No biliary sludge or gallstones. Findings raise the
possibility of acalculous cholecystitis. If further imaging
evaluation is clinically warranted, a nuclear medicine hepatobiliary
scan could be obtained to assess the patency of the cystic duct and
common bile duct.
2. Hepatic steatosis.

## 2022-05-24 IMAGING — DX DG CHEST 1V PORT
1 series · 1 of 1 positions shown · non-contrast
Comparison: CT chest dated November 22, 2021. Chest x-ray dated
August 26, 2020.

CLINICAL DATA: Fever.

EXAM:
PORTABLE CHEST 1 VIEW

[chest ap]
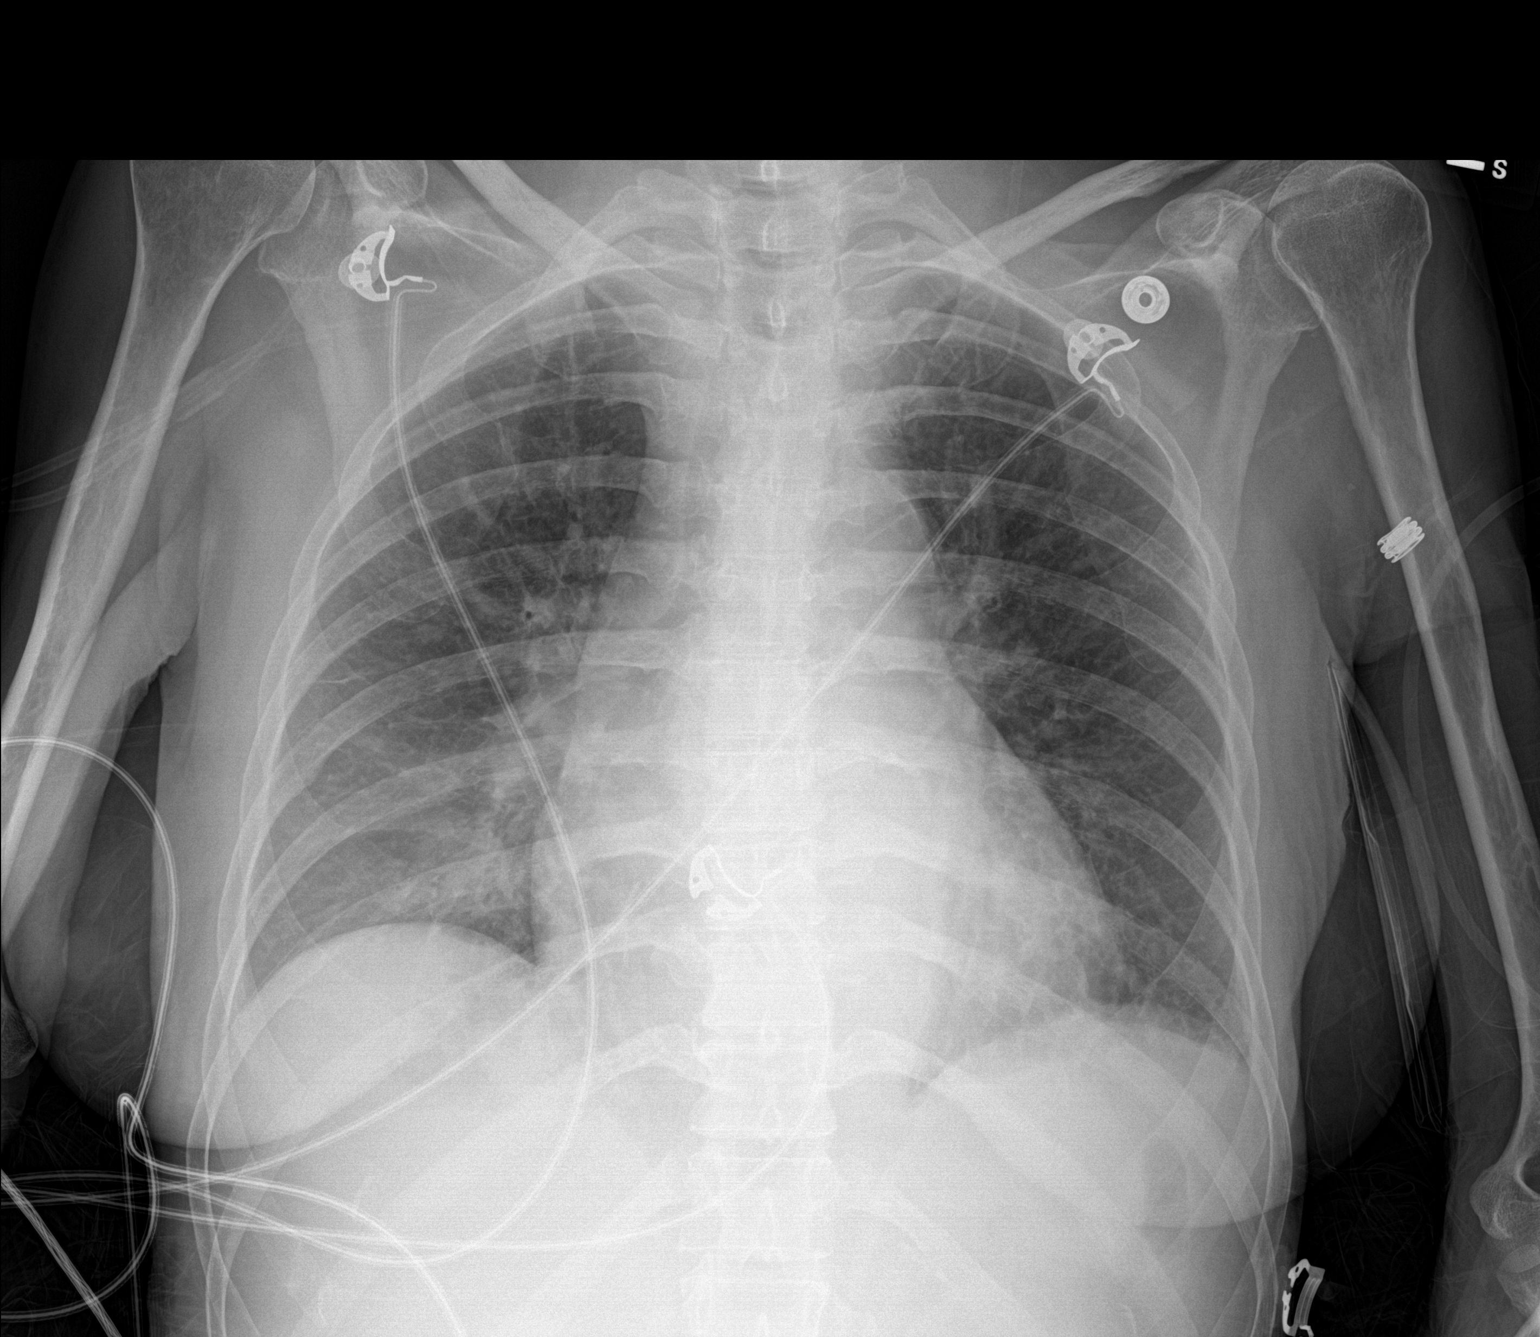

[1 of 1 positions shown; findings below may reference images not displayed]

FINDINGS: The heart size and mediastinal contours are within normal limits.
Mild patchy opacity at both lung bases. No pleural effusion or
pneumothorax. Chronic blunting/scarring at the left costophrenic
angle. No acute osseous abnormality.
IMPRESSION: 1. Bibasilar atelectasis versus pneumonia.

## 2022-07-07 ENCOUNTER — Ambulatory Visit: Payer: No Typology Code available for payment source | Admitting: Adult Health

## 2022-07-14 ENCOUNTER — Ambulatory Visit: Payer: No Typology Code available for payment source | Admitting: Neurology

## 2022-11-20 DIAGNOSIS — Z419 Encounter for procedure for purposes other than remedying health state, unspecified: Secondary | ICD-10-CM | POA: Diagnosis not present

## 2022-12-21 DIAGNOSIS — Z419 Encounter for procedure for purposes other than remedying health state, unspecified: Secondary | ICD-10-CM | POA: Diagnosis not present

## 2023-01-21 DIAGNOSIS — Z419 Encounter for procedure for purposes other than remedying health state, unspecified: Secondary | ICD-10-CM | POA: Diagnosis not present

## 2023-02-19 DIAGNOSIS — Z419 Encounter for procedure for purposes other than remedying health state, unspecified: Secondary | ICD-10-CM | POA: Diagnosis not present

## 2023-03-22 DIAGNOSIS — Z419 Encounter for procedure for purposes other than remedying health state, unspecified: Secondary | ICD-10-CM | POA: Diagnosis not present

## 2023-04-21 DIAGNOSIS — Z419 Encounter for procedure for purposes other than remedying health state, unspecified: Secondary | ICD-10-CM | POA: Diagnosis not present

## 2023-05-22 DIAGNOSIS — Z419 Encounter for procedure for purposes other than remedying health state, unspecified: Secondary | ICD-10-CM | POA: Diagnosis not present

## 2023-06-21 DIAGNOSIS — Z419 Encounter for procedure for purposes other than remedying health state, unspecified: Secondary | ICD-10-CM | POA: Diagnosis not present

## 2023-07-22 DIAGNOSIS — Z419 Encounter for procedure for purposes other than remedying health state, unspecified: Secondary | ICD-10-CM | POA: Diagnosis not present

## 2023-08-22 DIAGNOSIS — Z419 Encounter for procedure for purposes other than remedying health state, unspecified: Secondary | ICD-10-CM | POA: Diagnosis not present

## 2023-09-21 DIAGNOSIS — Z419 Encounter for procedure for purposes other than remedying health state, unspecified: Secondary | ICD-10-CM | POA: Diagnosis not present

## 2023-10-22 DIAGNOSIS — Z419 Encounter for procedure for purposes other than remedying health state, unspecified: Secondary | ICD-10-CM | POA: Diagnosis not present

## 2023-11-21 DIAGNOSIS — Z419 Encounter for procedure for purposes other than remedying health state, unspecified: Secondary | ICD-10-CM | POA: Diagnosis not present

## 2023-12-22 DIAGNOSIS — Z419 Encounter for procedure for purposes other than remedying health state, unspecified: Secondary | ICD-10-CM | POA: Diagnosis not present

## 2024-01-22 DIAGNOSIS — Z419 Encounter for procedure for purposes other than remedying health state, unspecified: Secondary | ICD-10-CM | POA: Diagnosis not present

## 2024-02-19 DIAGNOSIS — Z419 Encounter for procedure for purposes other than remedying health state, unspecified: Secondary | ICD-10-CM | POA: Diagnosis not present

## 2024-04-01 DIAGNOSIS — Z419 Encounter for procedure for purposes other than remedying health state, unspecified: Secondary | ICD-10-CM | POA: Diagnosis not present

## 2024-05-01 DIAGNOSIS — Z419 Encounter for procedure for purposes other than remedying health state, unspecified: Secondary | ICD-10-CM | POA: Diagnosis not present

## 2024-06-01 DIAGNOSIS — Z419 Encounter for procedure for purposes other than remedying health state, unspecified: Secondary | ICD-10-CM | POA: Diagnosis not present

## 2024-07-01 DIAGNOSIS — Z419 Encounter for procedure for purposes other than remedying health state, unspecified: Secondary | ICD-10-CM | POA: Diagnosis not present

## 2024-08-01 DIAGNOSIS — Z419 Encounter for procedure for purposes other than remedying health state, unspecified: Secondary | ICD-10-CM | POA: Diagnosis not present

## 2024-09-01 DIAGNOSIS — Z419 Encounter for procedure for purposes other than remedying health state, unspecified: Secondary | ICD-10-CM | POA: Diagnosis not present

## 2024-10-01 DIAGNOSIS — Z419 Encounter for procedure for purposes other than remedying health state, unspecified: Secondary | ICD-10-CM | POA: Diagnosis not present

## 2024-12-01 DIAGNOSIS — Z419 Encounter for procedure for purposes other than remedying health state, unspecified: Secondary | ICD-10-CM | POA: Diagnosis not present
